# Patient Record
Sex: Male | Born: 1940 | Race: White | Hispanic: No | Smoking: Former smoker
Health system: Southern US, Community
[De-identification: ages and names within clinical notes are randomized; demographics above are authoritative.]

## PROBLEM LIST (undated history)

## (undated) DIAGNOSIS — I2510 Atherosclerotic heart disease of native coronary artery without angina pectoris: Secondary | ICD-10-CM

## (undated) DIAGNOSIS — I10 Essential (primary) hypertension: Secondary | ICD-10-CM

## (undated) DIAGNOSIS — C801 Malignant (primary) neoplasm, unspecified: Secondary | ICD-10-CM

## (undated) DIAGNOSIS — R0600 Dyspnea, unspecified: Secondary | ICD-10-CM

## (undated) DIAGNOSIS — E785 Hyperlipidemia, unspecified: Secondary | ICD-10-CM

## (undated) DIAGNOSIS — R609 Edema, unspecified: Secondary | ICD-10-CM

## (undated) DIAGNOSIS — G2581 Restless legs syndrome: Secondary | ICD-10-CM

## (undated) DIAGNOSIS — G4733 Obstructive sleep apnea (adult) (pediatric): Secondary | ICD-10-CM

## (undated) DIAGNOSIS — R600 Localized edema: Secondary | ICD-10-CM

## (undated) DIAGNOSIS — J310 Chronic rhinitis: Secondary | ICD-10-CM

## (undated) DIAGNOSIS — N529 Male erectile dysfunction, unspecified: Secondary | ICD-10-CM

## (undated) DIAGNOSIS — I255 Ischemic cardiomyopathy: Secondary | ICD-10-CM

## (undated) DIAGNOSIS — F419 Anxiety disorder, unspecified: Secondary | ICD-10-CM

## (undated) DIAGNOSIS — E669 Obesity, unspecified: Secondary | ICD-10-CM

## (undated) DIAGNOSIS — I4819 Other persistent atrial fibrillation: Secondary | ICD-10-CM

## (undated) DIAGNOSIS — Z720 Tobacco use: Secondary | ICD-10-CM

## (undated) DIAGNOSIS — M1990 Unspecified osteoarthritis, unspecified site: Secondary | ICD-10-CM

## (undated) DIAGNOSIS — G4700 Insomnia, unspecified: Secondary | ICD-10-CM

## (undated) HISTORY — DX: Hyperlipidemia, unspecified: E78.5

## (undated) HISTORY — DX: Unspecified osteoarthritis, unspecified site: M19.90

## (undated) HISTORY — DX: Atherosclerotic heart disease of native coronary artery without angina pectoris: I25.10

## (undated) HISTORY — DX: Localized edema: R60.0

## (undated) HISTORY — DX: Insomnia, unspecified: G47.00

## (undated) HISTORY — PX: EYE SURGERY: SHX253

## (undated) HISTORY — DX: Malignant (primary) neoplasm, unspecified: C80.1

## (undated) HISTORY — DX: Restless legs syndrome: G25.81

## (undated) HISTORY — DX: Chronic rhinitis: J31.0

## (undated) HISTORY — DX: Obstructive sleep apnea (adult) (pediatric): G47.33

## (undated) HISTORY — DX: Anxiety disorder, unspecified: F41.9

## (undated) HISTORY — DX: Edema, unspecified: R60.9

## (undated) HISTORY — DX: Essential (primary) hypertension: I10

## (undated) HISTORY — DX: Male erectile dysfunction, unspecified: N52.9

## (undated) HISTORY — DX: Obesity, unspecified: E66.9

## (undated) HISTORY — PX: HEMORRHOID SURGERY: SHX153

## (undated) HISTORY — PX: TOTAL KNEE ARTHROPLASTY: SHX125

## (undated) HISTORY — DX: Tobacco use: Z72.0

## (undated) HISTORY — PX: CORONARY ANGIOPLASTY: SHX604

---

## 2004-09-03 ENCOUNTER — Ambulatory Visit (HOSPITAL_COMMUNITY): Admission: RE | Admit: 2004-09-03 | Discharge: 2004-09-03 | Payer: Self-pay | Admitting: Orthopedic Surgery

## 2004-10-07 ENCOUNTER — Ambulatory Visit: Payer: Self-pay | Admitting: Internal Medicine

## 2004-10-08 ENCOUNTER — Ambulatory Visit: Payer: Self-pay

## 2004-10-09 ENCOUNTER — Encounter (INDEPENDENT_AMBULATORY_CARE_PROVIDER_SITE_OTHER): Payer: Self-pay | Admitting: *Deleted

## 2004-10-09 ENCOUNTER — Inpatient Hospital Stay (HOSPITAL_COMMUNITY): Admission: RE | Admit: 2004-10-09 | Discharge: 2004-10-13 | Payer: Self-pay | Admitting: Orthopedic Surgery

## 2004-12-16 ENCOUNTER — Emergency Department (HOSPITAL_COMMUNITY): Admission: EM | Admit: 2004-12-16 | Discharge: 2004-12-16 | Payer: Self-pay | Admitting: Emergency Medicine

## 2005-09-22 ENCOUNTER — Ambulatory Visit (HOSPITAL_COMMUNITY): Admission: RE | Admit: 2005-09-22 | Discharge: 2005-09-22 | Payer: Self-pay | Admitting: Pulmonary Disease

## 2005-11-03 ENCOUNTER — Ambulatory Visit (HOSPITAL_BASED_OUTPATIENT_CLINIC_OR_DEPARTMENT_OTHER): Admission: RE | Admit: 2005-11-03 | Discharge: 2005-11-03 | Payer: Self-pay | Admitting: Pulmonary Disease

## 2005-11-03 ENCOUNTER — Encounter: Payer: Self-pay | Admitting: Pulmonary Disease

## 2005-11-07 ENCOUNTER — Ambulatory Visit: Payer: Self-pay | Admitting: Internal Medicine

## 2008-01-17 ENCOUNTER — Ambulatory Visit (HOSPITAL_COMMUNITY): Admission: RE | Admit: 2008-01-17 | Discharge: 2008-01-17 | Payer: Self-pay | Admitting: Pulmonary Disease

## 2008-01-30 ENCOUNTER — Encounter (INDEPENDENT_AMBULATORY_CARE_PROVIDER_SITE_OTHER): Payer: Self-pay | Admitting: Pulmonary Disease

## 2008-01-30 ENCOUNTER — Ambulatory Visit (HOSPITAL_COMMUNITY): Admission: RE | Admit: 2008-01-30 | Discharge: 2008-01-30 | Payer: Self-pay | Admitting: Pulmonary Disease

## 2008-01-30 ENCOUNTER — Ambulatory Visit: Payer: Self-pay | Admitting: Cardiology

## 2008-02-01 ENCOUNTER — Ambulatory Visit: Payer: Self-pay | Admitting: Internal Medicine

## 2008-02-09 ENCOUNTER — Ambulatory Visit: Payer: Self-pay | Admitting: Internal Medicine

## 2008-02-09 LAB — CONVERTED CEMR LAB
BUN: 20 mg/dL (ref 6–23)
Glucose, Bld: 100 mg/dL — ABNORMAL HIGH (ref 70–99)
Potassium: 4.3 meq/L (ref 3.5–5.3)

## 2008-02-29 ENCOUNTER — Ambulatory Visit: Payer: Self-pay | Admitting: Pulmonary Disease

## 2008-02-29 DIAGNOSIS — J309 Allergic rhinitis, unspecified: Secondary | ICD-10-CM | POA: Insufficient documentation

## 2008-02-29 DIAGNOSIS — F17200 Nicotine dependence, unspecified, uncomplicated: Secondary | ICD-10-CM

## 2008-02-29 DIAGNOSIS — G4733 Obstructive sleep apnea (adult) (pediatric): Secondary | ICD-10-CM

## 2008-02-29 DIAGNOSIS — E785 Hyperlipidemia, unspecified: Secondary | ICD-10-CM

## 2008-02-29 DIAGNOSIS — I10 Essential (primary) hypertension: Secondary | ICD-10-CM | POA: Insufficient documentation

## 2008-03-01 ENCOUNTER — Encounter: Payer: Self-pay | Admitting: Pulmonary Disease

## 2008-03-05 ENCOUNTER — Telehealth: Payer: Self-pay | Admitting: Pulmonary Disease

## 2008-03-28 ENCOUNTER — Encounter: Payer: Self-pay | Admitting: Pulmonary Disease

## 2008-03-28 ENCOUNTER — Ambulatory Visit (HOSPITAL_COMMUNITY): Admission: RE | Admit: 2008-03-28 | Discharge: 2008-03-28 | Payer: Self-pay | Admitting: Internal Medicine

## 2008-04-05 ENCOUNTER — Ambulatory Visit: Payer: Self-pay | Admitting: Internal Medicine

## 2008-04-09 ENCOUNTER — Ambulatory Visit: Payer: Self-pay | Admitting: Cardiovascular Disease

## 2008-04-09 ENCOUNTER — Encounter: Payer: Self-pay | Admitting: Internal Medicine

## 2008-04-09 LAB — CONVERTED CEMR LAB
ALT: 17 units/L (ref 0–53)
AST: 11 units/L (ref 0–37)
Albumin: 4.3 g/dL (ref 3.5–5.2)
Alkaline Phosphatase: 56 units/L (ref 39–117)
BUN: 17 mg/dL (ref 6–23)
Calcium: 9.5 mg/dL (ref 8.4–10.5)
Chloride: 103 meq/L (ref 96–112)
Cholesterol: 130 mg/dL (ref 0–200)
Creatinine, Ser: 0.83 mg/dL (ref 0.40–1.50)
HDL: 38 mg/dL — ABNORMAL LOW (ref >39)
Total Bilirubin: 0.7 mg/dL (ref 0.3–1.2)

## 2008-04-15 ENCOUNTER — Ambulatory Visit: Payer: Self-pay | Admitting: Pulmonary Disease

## 2008-11-01 ENCOUNTER — Encounter: Payer: Self-pay | Admitting: Pulmonary Disease

## 2008-11-11 ENCOUNTER — Encounter: Payer: Self-pay | Admitting: Pulmonary Disease

## 2008-11-21 ENCOUNTER — Encounter: Payer: Self-pay | Admitting: Pulmonary Disease

## 2009-02-13 ENCOUNTER — Emergency Department (HOSPITAL_COMMUNITY): Admission: EM | Admit: 2009-02-13 | Discharge: 2009-02-13 | Payer: Self-pay | Admitting: Emergency Medicine

## 2009-04-22 ENCOUNTER — Telehealth (INDEPENDENT_AMBULATORY_CARE_PROVIDER_SITE_OTHER): Payer: Self-pay | Admitting: *Deleted

## 2009-04-24 ENCOUNTER — Ambulatory Visit: Payer: Self-pay | Admitting: Pulmonary Disease

## 2009-04-24 DIAGNOSIS — G2581 Restless legs syndrome: Secondary | ICD-10-CM

## 2009-04-24 DIAGNOSIS — G4700 Insomnia, unspecified: Secondary | ICD-10-CM

## 2009-04-25 LAB — CONVERTED CEMR LAB
ALT: 16 units/L (ref 0–53)
AST: 14 units/L (ref 0–37)
Albumin: 4.4 g/dL (ref 3.5–5.2)
Alkaline Phosphatase: 82 units/L (ref 39–117)
BUN: 23 mg/dL (ref 6–23)
Bilirubin, Direct: 0.2 mg/dL (ref 0.0–0.3)
CO2: 28 meq/L (ref 19–32)
Calcium: 9.6 mg/dL (ref 8.4–10.5)
Creatinine, Ser: 1.2 mg/dL (ref 0.4–1.5)
Ferritin: 93.7 ng/mL (ref 22.0–322.0)
Folate: 11 ng/mL
HCT: 52.4 % — ABNORMAL HIGH (ref 39.0–52.0)
Hemoglobin: 17.7 g/dL — ABNORMAL HIGH (ref 13.0–17.0)
Lymphs Abs: 1.3 10*3/uL (ref 0.7–4.0)
MCHC: 33.8 g/dL (ref 30.0–36.0)
Neutro Abs: 9.3 10*3/uL — ABNORMAL HIGH (ref 1.4–7.7)
Neutrophils Relative %: 78.8 % — ABNORMAL HIGH (ref 43.0–77.0)
Platelets: 223 10*3/uL (ref 150.0–400.0)
RBC: 5.41 M/uL (ref 4.22–5.81)
Saturation Ratios: 31.4 % (ref 20.0–50.0)
Total Protein: 8.1 g/dL (ref 6.0–8.3)
WBC: 11.7 10*3/uL — ABNORMAL HIGH (ref 4.5–10.5)

## 2009-04-30 ENCOUNTER — Encounter: Payer: Self-pay | Admitting: Cardiovascular Disease

## 2009-06-05 ENCOUNTER — Ambulatory Visit: Payer: Self-pay | Admitting: Pulmonary Disease

## 2009-06-20 ENCOUNTER — Telehealth (INDEPENDENT_AMBULATORY_CARE_PROVIDER_SITE_OTHER): Payer: Self-pay | Admitting: *Deleted

## 2009-08-19 ENCOUNTER — Encounter: Payer: Self-pay | Admitting: Pulmonary Disease

## 2009-08-30 HISTORY — PX: ROTATOR CUFF REPAIR: SHX139

## 2009-09-23 ENCOUNTER — Encounter: Admission: RE | Admit: 2009-09-23 | Discharge: 2009-09-23 | Payer: Self-pay | Admitting: Specialist

## 2009-10-07 ENCOUNTER — Ambulatory Visit: Payer: Self-pay | Admitting: Internal Medicine

## 2009-10-07 DIAGNOSIS — I2510 Atherosclerotic heart disease of native coronary artery without angina pectoris: Secondary | ICD-10-CM

## 2009-10-15 ENCOUNTER — Encounter: Payer: Self-pay | Admitting: Pulmonary Disease

## 2009-10-17 ENCOUNTER — Encounter: Payer: Self-pay | Admitting: Pulmonary Disease

## 2009-10-21 ENCOUNTER — Inpatient Hospital Stay (HOSPITAL_COMMUNITY): Admission: RE | Admit: 2009-10-21 | Discharge: 2009-10-24 | Payer: Self-pay | Admitting: Specialist

## 2009-10-28 HISTORY — PX: BACK SURGERY: SHX140

## 2010-04-07 ENCOUNTER — Telehealth: Payer: Self-pay | Admitting: Pulmonary Disease

## 2010-04-29 ENCOUNTER — Encounter: Payer: Self-pay | Admitting: Pulmonary Disease

## 2010-04-30 ENCOUNTER — Ambulatory Visit: Payer: Self-pay | Admitting: Pulmonary Disease

## 2010-07-16 ENCOUNTER — Emergency Department (HOSPITAL_COMMUNITY): Admission: EM | Admit: 2010-07-16 | Discharge: 2010-07-16 | Payer: Self-pay | Admitting: Emergency Medicine

## 2010-07-20 ENCOUNTER — Emergency Department (HOSPITAL_COMMUNITY): Admission: EM | Admit: 2010-07-20 | Discharge: 2010-07-20 | Payer: Self-pay | Admitting: Emergency Medicine

## 2010-07-23 ENCOUNTER — Emergency Department (HOSPITAL_COMMUNITY): Admission: EM | Admit: 2010-07-23 | Discharge: 2010-07-23 | Payer: Self-pay | Admitting: Emergency Medicine

## 2010-08-28 ENCOUNTER — Telehealth: Payer: Self-pay | Admitting: Pulmonary Disease

## 2010-09-17 ENCOUNTER — Ambulatory Visit
Admission: RE | Admit: 2010-09-17 | Discharge: 2010-09-17 | Payer: Self-pay | Source: Home / Self Care | Attending: Pulmonary Disease | Admitting: Pulmonary Disease

## 2010-09-20 ENCOUNTER — Encounter: Payer: Self-pay | Admitting: Internal Medicine

## 2010-09-21 ENCOUNTER — Encounter: Payer: Self-pay | Admitting: Internal Medicine

## 2010-09-29 NOTE — Assessment & Plan Note (Signed)
Summary: ec6      Allergies Added: NKDA  Visit Type:  EC6 Referring Provider:  Arvilla Meres Primary Provider:  Kari Baars  CC:  no complaints.  History of Present Illness: [name] [name] is a very pleasant 70 year old gentleman with a history of coronary artery disease, angioplasty in 1999, normal stress test in 2006, essentially normal echocardiogram in 2009, obesity, sleep apnea, high cholesterol, hypertension, continued smoking, severe back pain who presents for preoperative evaluation.  [name] [name] states that overall he is doing well. He denies any significant shortness of breath, chest pain. He is relatively active though limited to some degree by his claudication type pain in his buttock and calf. He is due to have surgery on February 22. He has no complaints. He has dropped his weight 25 pounds over the past several months by watching his diet. He has cut back on his smoking from 2 packs a day to one half pack per day.  Current Problems (verified): 1)  Restless Leg Syndrome  (ICD-333.94) 2)  Insomnia  (ICD-780.52) 3)  Tobacco Abuse  (ICD-305.1) 4)  Sleep Apnea, Obstructive  (ICD-327.23) 5)  Hypertension  (ICD-401.9) 6)  Hyperlipidemia  (ICD-272.4) 7)  Allergic Rhinitis  (ICD-477.9)  Current Medications (verified): 1)  Hydrocodone-Acetaminophen 10-500 Mg  Tabs (Hydrocodone-Acetaminophen) .... Take Four Daily. 2)  Celebrex 200 Mg  Caps (Celecoxib) .... 2 By Mouth Daily 3)  Alprazolam 2 Mg  Tabs (Alprazolam) .... Take One Before Bed. 4)  Ropinirole Hcl 1 Mg Tabs (Ropinirole Hcl) .... One By Mouth At Bedtime 5)  Lovastatin 20 Mg Tabs (Lovastatin) .... Take One Tablet By Mouth Daily At Bedtime 6)  Zolpidem Tartrate 5 Mg Tabs (Zolpidem Tartrate) .... Take 1 Tab By Mouth At Bedtime As Needed  Allergies (verified): No Known Drug Allergies  Past History:  Past Medical History: Last updated: 06/05/2009 HTN Hyperlipidemia Rhinitis Peripheral edema Osteoarthritis Morbid  Obesity CAD s/p angioplasty Erectile dysfunction Severe OSA on CPAP 12 Restless leg syndrome  Past Surgical History: Last updated: 03-23-2008 Total Knee Arthroplasty left  hemorrhoidectomy  Family History: Last updated: 2008-03-23 Father died in World War II Mother died of natural causes Has one 1/2 sister no health problems  Social History: Last updated: 03-23-08 Widow.  Disabled arthritis.  1ppd 70 yo.  Retired Curator.  Risk Factors: Smoking Status: current (03-23-2008) Packs/Day: 1 (2008-03-23)  Review of Systems  The patient denies anorexia, fever, weight loss, weight gain, vision loss, decreased hearing, hoarseness, chest pain, syncope, dyspnea on exertion, peripheral edema, prolonged cough, headaches, hemoptysis, abdominal pain, melena, hematochezia, severe indigestion/heartburn, hematuria, incontinence, genital sores, muscle weakness, suspicious skin lesions, transient blindness, difficulty walking, depression, unusual weight change, abnormal bleeding, enlarged lymph nodes, angioedema, breast masses, and testicular masses.         Miuld SOB with exertion, chronic  Vital Signs:  Patient profile:   70 year old male Height:      72 inches Weight:      286 pounds BMI:     38.93 Pulse rate:   72 / minute Pulse rhythm:   regular BP sitting:   122 / 82  (left arm) Cuff size:   large  Vitals Entered By: [name] [name] (October 07, 2009 2:02 PM)  Physical Exam  General:  moderately obese gentleman in no apparent distress. He appears comfortable on the exam table. Not short of breath. No significant coughing. HEENT exam is benign, oropharynx is clear, neck is supple with no JVP or carotid bruits, heart sounds  are regular with normal S1 and S2 with no murmurs appreciated, lungs are clear bilaterally scant rales, abdominal exam notable for moderate obesity, no significant lower extremity edema, neurologic exam is grossly nonfocal, skin is warm and dry pulses are  equal and symmetrical in his upper and lower extremities.   Impression & Recommendations:  Problem # 1:  CAD, NATIVE VESSEL (ICD-414.01) history of coronary artery disease with angioplasty by Dr. Deborah Chalk in 1999. Negative stress test in 2006, normal echo in 2009. He is symptom-free. No significant carotid disease in 2006. No significant lower extremity arterial disease when evaluated in May 2009.  Problem # 2:  PRE-OPERATIVE CARDIAC EXAM (ICD-V72.81)  Given that he has no symptoms, we will clear him for surgery. I did talk to him about performing a stress test as it has been several years since his last study. He would like to hold off at this time as he is not having any problems.  Problem # 3:  TOBACCO ABUSE (ICD-305.1) we did spend some time talking to him about his smoking and he'll continue to try to cut back. He was smoking 2 packs per day and currently smokes one half pack per day. He is not interested in Chantix or other medications at this time.  Problem # 4:  HYPERLIPIDEMIA-MIXED (ICD-272.4) we will obtain his most recent cholesterol panel from Dr. Juanetta Gosling. He states that he is due to have a lab test next week when he goes to see him for clearance. His updated medication list for this problem includes:    Lovastatin 20 Mg Tabs (Lovastatin) .Marland Kitchen... Take one tablet by mouth daily at bedtime

## 2010-09-29 NOTE — Progress Notes (Signed)
Summary: Office Visit  Office Visit   Imported By: Harlon Flor 10/14/2009 16:09:46  _____________________________________________________________________  External Attachment:    Type:   Image     Comment:   External Document

## 2010-09-29 NOTE — Letter (Signed)
Summary: LMN/[facility]  LMN/[facility]   Imported By: Lester [place]st 05/11/2010 07:53:17  _____________________________________________________________________  External Attachment:    Type:   Image     Comment:   External Document

## 2010-09-29 NOTE — Letter (Signed)
Summary: CMN/[facility]  CMN/[facility]   Imported By: Lester Mayetta 10/21/2009 09:42:51  _____________________________________________________________________  External Attachment:    Type:   Image     Comment:   External Document

## 2010-09-29 NOTE — Progress Notes (Signed)
Summary: speak to nurse  Phone Note From Other Clinic Call back at [phone]   Caller: megan//[facility] Call For: sood Summary of Call: Request a rx for cpap supplies and a copy of pt's sleep study, fax # is [phone]. Initial call taken by: Darletta Moll,  April 07, 2010 9:28 AM  Follow-up for Phone Call        VS, do you want to ok cpap supplies.  Pt last saw you Oct 2010 and was instructed to f/u in 4 to 6 weeks. Pt has never followed up since the Oct appt.   and has no pending appts with you.  please advise.  Aundra Millet Reynolds LPN  April 07, 2010 9:40 AM   Additional Follow-up for Phone Call Additional follow up Details #1::        Okay to renew CPAP supplies this time.  He will need to schedule next available ROV to review status.  Ok to fax sleep study results. Additional Follow-up by: Coralyn Helling MD,  April 07, 2010 9:50 AM    Additional Follow-up for Phone Call Additional follow up Details #2::    Orders faxed to [phone] with pt's demographics, and sleep study. Zackery Barefoot CMA  April 07, 2010 12:05 PM

## 2010-09-29 NOTE — Medication Information (Signed)
Summary: CPAP Mask/[facility]  CPAP Mask/[facility]   Imported By: Sherian Rein 10/22/2009 07:09:24  _____________________________________________________________________  External Attachment:    Type:   Image     Comment:   External Document

## 2010-09-29 NOTE — Assessment & Plan Note (Signed)
Summary: follow up on CPAP//jwr   Visit Type:  Follow-up Copy to:  Dossie Arbour Primary Provider/Referring Provider:  Kari Baars  CC:  OSA...RLS.Marland KitchenMarland KitchenInsomnia... The patient says he is doing well on cpap...would like to discuss a new supplier for supplies.  History of Present Illness: 70 yo male with severe OSA on CPAP 12, and RLS.  He has been sleeping well.  He goes to bed at 11pm.  He sleeps through the night.  He gets out of bed at 730am, and feels good.  He uses xanax and 2 mg requip at night.  He is not using ambien.  This didn't help.  He uses his CPAP for the whole night.  He has not problem using his mask.  He continues to smoke cigarettes.  He wants to get set up with [facility] for his DME.  Preventive Screening-Counseling & Management  Alcohol-Tobacco     Smoking Status: current     Packs/Day: 1  Current Medications (verified): 1)  Oxycodone Hcl 15 Mg Tabs (Oxycodone Hcl) .Marland Kitchen.. 1 By Mouth Three Times A Day 2)  Celebrex 200 Mg  Caps (Celecoxib) .Marland Kitchen.. 1 By Mouth Two Times A Day 3)  Alprazolam 2 Mg  Tabs (Alprazolam) .... Take One Before Bed. 4)  Ropinirole Hcl 1 Mg Tabs (Ropinirole Hcl) .... 2 By Mouth At Bedtime 5)  Lovastatin 20 Mg Tabs (Lovastatin) .... Take One Tablet By Mouth Daily At Bedtime 6)  Zolpidem Tartrate 5 Mg Tabs (Zolpidem Tartrate) .... Take 1 Tab By Mouth At Bedtime As Needed  Allergies (verified): No Known Drug Allergies  Past History:  Past Medical History: Reviewed history from 06/05/2009 and no changes required. HTN Hyperlipidemia Rhinitis Peripheral edema Osteoarthritis Morbid Obesity CAD s/p angioplasty Erectile dysfunction Severe OSA on CPAP 12 Restless leg syndrome  Past Surgical History: Total Knee Arthroplasty left  hemorrhoidectomy Back surgery 10/2009  Vital Signs:  Patient profile:   70 year old male Height:      72 inches (182.88 cm) Weight:      299 pounds (135.91 kg) BMI:     40.70 O2 Sat:      95 % on  Room air Temp:     98.2 degrees F (36.78 degrees C) oral Pulse rate:   72 / minute BP sitting:   132 / 78  (left arm) Cuff size:   large  Vitals Entered By: Michel Bickers CMA (April 30, 2010 11:45 AM)  O2 Sat at Rest %:  95 O2 Flow:  Room air CC: OSA...RLS.Marland KitchenMarland KitchenInsomnia... The patient says he is doing well on cpap...would like to discuss a new supplier for supplies Comments Medications reviewed with the patient. Daytime phone verified. Michel Bickers CMA  April 30, 2010 11:53 AM   Physical Exam  General:  obese.   Nose:  no deformity, discharge, inflammation, or lesions Mouth:  Dentures, MP 3, enlarged tongue Neck:  no JVD.   Lungs:  clear bilaterally to auscultation and percussion Heart:  regular rate and rhythm, S1, S2 without murmurs, rubs, gallops, or clicks Extremities:  no edema Cervical Nodes:  no significant adenopathy Psych:  alert and cooperative; normal mood and affect; normal attention span and concentration   Impression & Recommendations:  Problem # 1:  SLEEP APNEA, OBSTRUCTIVE (ICD-327.23)  He has done well with CPAP.  Will arrange for him to get a new DME.  Problem # 2:  RESTLESS LEG SYNDROME (ICD-333.94)  He is to continue requip at bedtime.  Advised that he  may improve once his back pain is further addressed.  Problem # 3:  INSOMNIA (ICD-780.52)  likely from his RLS.  He is to continue xanax.  Problem # 4:  TOBACCO ABUSE (ICD-305.1)  discussed the importance of smoking cessation.  Medications Added to Medication List This Visit: 1)  Oxycodone Hcl 15 Mg Tabs (Oxycodone hcl) .Marland Kitchen.. 1 by mouth three times a day 2)  Celebrex 200 Mg Caps (Celecoxib) .Marland Kitchen.. 1 by mouth two times a day 3)  Ropinirole Hcl 1 Mg Tabs (Ropinirole hcl) .... 2 by mouth at bedtime  Complete Medication List: 1)  Oxycodone Hcl 15 Mg Tabs (Oxycodone hcl) .Marland Kitchen.. 1 by mouth three times a day 2)  Celebrex 200 Mg Caps (Celecoxib) .Marland Kitchen.. 1 by mouth two times a day 3)  Alprazolam 2 Mg Tabs  (Alprazolam) .... Take one before bed. 4)  Ropinirole Hcl 1 Mg Tabs (Ropinirole hcl) .... 2 by mouth at bedtime 5)  Lovastatin 20 Mg Tabs (Lovastatin) .... Take one tablet by mouth daily at bedtime  Other Orders: Est. Patient Level III (57846)  Patient Instructions: 1)  Follow up in one year

## 2010-10-01 NOTE — Progress Notes (Signed)
Summary: prescription  Phone Note Call from Patient Call back at Home Phone [phone]   Caller: Patient Call For: Dr. Craige Cotta Summary of Call: Patient phoned and stated that the last time he saw Dr. Craige Cotta he asked if he needed anything for sleep and at the time he did not. But he has only slept on an average of two hours each night for the last week and wanted to know if Dr. Craige Cotta would call him in something to help him sleep. [facility] uses [facility] [address] [phone]. Patient can be reached on his  cell [phone] Initial call taken by: Vedia Coffer,  August 28, 2010 8:48 AM  Follow-up for Phone Call        Spoke with pt and he states that over the last week or so he has had trouble falling asleep. He states his brain just won't cut off. He had some xanax from his PCP but this has stopped working.  Please advise. Carron Curie CMA  August 28, 2010 9:26 AM   Additional Follow-up for Phone Call Additional follow up Details #1::        He needs to come in for further evaluation of his insomnia.  I am not sure a sleep aide alone will be enough.  Please schedule him for next available ROV to discuss further. Additional Follow-up by: Coralyn Helling MD,  August 28, 2010 1:33 PM    Additional Follow-up for Phone Call Additional follow up Details #2::    Called, spoke with pt.  He was informed of above per VS.  ROV scheduled for Jan 19 at 3:45 pm with VS -- first available.  Dr. Craige Cotta, pt would like to know if you can prescribe something to hold him over until OV bc he "will need something."  Pls advise.  Thanks! Gweneth Dimitri RN  August 28, 2010 2:43 PM   Additional Follow-up for Phone Call Additional follow up Details #3:: Details for Additional Follow-up Action Taken: can give him script for trazodone 50 mg at bedtime as needed.  Dispense 30 with no refills.    called and spoke with pt and he is aware of meds sent in to pharmacy per VS---pt will keep his appt in jan with vS Randell Loop CMA  August 28, 2010 3:06 PM  Additional Follow-up by: Coralyn Helling MD,  August 28, 2010 2:58 PM  New/Updated Medications: TRAZODONE HCL 50 MG TABS (TRAZODONE HCL) take 1 tablet by mouth at bedtime as needed for sleep Prescriptions: TRAZODONE HCL 50 MG TABS (TRAZODONE HCL) take 1 tablet by mouth at bedtime as needed for sleep  #30 x 0   Entered by:   Randell Loop CMA   Authorized by:   Coralyn Helling MD   Signed by:   Randell Loop CMA on 08/28/2010   Method used:   [facility]  [address] [phone][facility] (retail)       [address]       [place], [place]  [id number]       Ph: [phone] or [phone]       Fax: [phone]   RxID:   [id number]

## 2010-10-01 NOTE — Assessment & Plan Note (Signed)
Summary: discuss sleep problems / cj   Copy to:  Dossie Arbour Primary Provider/Referring Provider:  Kari Baars  CC:  Follow up. Pt states he uses his cpap eveyrnight except when he has sinus problems. Pt states he is not eing able to sleep and would like an rx for that. Pt states he is only able to sleep a couple hrs at a time.  History of Present Illness: 70 yo male with severe OSA on CPAP 12, and RLS.  He has been getting more trouble sleeping.  He has trouble falling asleep and staying asleep.  He goes to bed at 1030pm.  He takes xanax at 930pm, and requip at 1030 pm.  His legs symptoms have been under control.  He wakes up at 330 am, and then can't fall back to sleep.  He gets out of bed at 4am.  He feels tired during the day, but is not able to take naps.  He will occasionally using oxycodone to help with back and leg pains, but tries to avoid using this around his bed time.  He was recently started on trazadone.  He takes this at 930pm, and this seems to help, but does not last long enough.  This seems to work better when combined with xanax.  He has been feeling very anxious, and he thinks this is contributing to trouble with his sleep.  He has been doing well with CPAP.    Current Medications (verified): 1)  Oxycodone Hcl 15 Mg Tabs (Oxycodone Hcl) .Marland Kitchen.. 1 By Mouth Three Times A Day 2)  Celebrex 200 Mg  Caps (Celecoxib) .Marland Kitchen.. 1 By Mouth Two Times A Day 3)  Alprazolam 2 Mg  Tabs (Alprazolam) .... Take One Before Bed. 4)  Ropinirole Hcl 1 Mg Tabs (Ropinirole Hcl) .... 2 By Mouth At Bedtime 5)  Lovastatin 20 Mg Tabs (Lovastatin) .... Take One Tablet By Mouth Daily At Bedtime 6)  Trazodone Hcl 50 Mg Tabs (Trazodone Hcl) .... Take 1 Tablet By Mouth At Bedtime As Needed For Sleep  Allergies (verified): No Known Drug Allergies  Past History:  Past Medical History: HTN Hyperlipidemia Rhinitis Peripheral edema Osteoarthritis Morbid Obesity CAD s/p angioplasty Erectile  dysfunction Severe OSA on CPAP 12 Restless leg syndrome Insomnia Anxiety  Past Surgical History: Reviewed history from 04/30/2010 and no changes required. Total Knee Arthroplasty left  hemorrhoidectomy Back surgery 10/2009  Vital Signs:  Patient profile:   70 year old male Height:      72 inches Weight:      306.13 pounds BMI:     41.67 O2 Sat:      98 % on Room air Temp:     98.1 degrees F oral Pulse rate:   65 / minute BP sitting:   138 / 82  (left arm) Cuff size:   large  Vitals Entered By: Carver Fila (September 17, 2010 3:48 PM)  O2 Flow:  Room air CC: Follow up. Pt states he uses his cpap eveyrnight except when he has sinus problems. Pt states he is not eing able to sleep and would like an rx for that. Pt states he is only able to sleep a couple hrs at a time Comments meds and allergies updated Phone number updated Carver Fila  September 17, 2010 3:48 PM    Physical Exam  General:  obese.   Nose:  no deformity, discharge, inflammation, or lesions Mouth:  Dentures, MP 3, enlarged tongue Neck:  no JVD.   Lungs:  clear bilaterally  to auscultation and percussion Heart:  regular rate and rhythm, S1, S2 without murmurs, rubs, gallops, or clicks Extremities:  no edema Neurologic:  normal CN II-XII and strength normal.   Cervical Nodes:  no significant adenopathy Psych:  anxious.     Impression & Recommendations:  Problem # 1:  INSOMNIA (ICD-780.52)  Likely related to anxiety.  Will increase trazodone to 100 mg at bedtime, and then try as needed alprazolam only.  He is to call if this combination is not working.  He is to follow up with primary care for further assessment of his anxiety.  Problem # 2:  RESTLESS LEG SYNDROME (ICD-333.94)  Stable on current dose of ropinirole.  Problem # 3:  SLEEP APNEA, OBSTRUCTIVE (ICD-327.23)  No change to CPAP set up.  Medications Added to Medication List This Visit: 1)  Trazodone Hcl 100 Mg Tabs (Trazodone hcl) .... One at  bedtime as needed for sleep  Complete Medication List: 1)  Oxycodone Hcl 15 Mg Tabs (Oxycodone hcl) .Marland Kitchen.. 1 by mouth three times a day 2)  Celebrex 200 Mg Caps (Celecoxib) .Marland Kitchen.. 1 by mouth two times a day 3)  Alprazolam 2 Mg Tabs (Alprazolam) .... Take one before bed. 4)  Ropinirole Hcl 1 Mg Tabs (Ropinirole hcl) .... 2 by mouth at bedtime 5)  Lovastatin 20 Mg Tabs (Lovastatin) .... Take one tablet by mouth daily at bedtime 6)  Trazodone Hcl 100 Mg Tabs (Trazodone hcl) .... One at bedtime as needed for sleep  Other Orders: Est. Patient Level IV (91478)  Patient Instructions: 1)  Try trazodone 100 mg at bedtime for sleep 2)  Follow up in 4 months Prescriptions: TRAZODONE HCL 100 MG TABS (TRAZODONE HCL) one at bedtime as needed for sleep  #30 x 6   Entered and Authorized by:   Coralyn Helling MD   Signed by:   Coralyn Helling MD on 09/17/2010   Method used:   [facility]        [facility]  [address] [phone][facility] (retail)       [address]       [place], [place]  [id number]       Ph: [phone] or [phone]       Fax: [phone]   RxID:   [id number]

## 2010-11-18 LAB — CBC
HCT: 44.9 % (ref 39.0–52.0)
Hemoglobin: 15.4 g/dL (ref 13.0–17.0)
MCHC: 34.4 g/dL (ref 30.0–36.0)
MCV: 95.6 fL (ref 78.0–100.0)
RBC: 4.7 MIL/uL (ref 4.22–5.81)
WBC: 8.2 10*3/uL (ref 4.0–10.5)

## 2010-11-18 LAB — BASIC METABOLIC PANEL
CO2: 27 mEq/L (ref 19–32)
Calcium: 8.8 mg/dL (ref 8.4–10.5)
Chloride: 102 mEq/L (ref 96–112)
Creatinine, Ser: 0.75 mg/dL (ref 0.4–1.5)
Creatinine, Ser: 0.87 mg/dL (ref 0.4–1.5)
GFR calc non Af Amer: >60 mL/min (ref >60)
GFR calc non Af Amer: >60 mL/min (ref >60)
Glucose, Bld: 107 mg/dL — ABNORMAL HIGH (ref 70–99)
Potassium: 4.5 mEq/L (ref 3.5–5.1)
Sodium: 134 mEq/L — ABNORMAL LOW (ref 135–145)

## 2010-11-18 LAB — HEMOGLOBIN AND HEMATOCRIT, BLOOD
HCT: 38.3 % — ABNORMAL LOW (ref 39.0–52.0)
HCT: 38.7 % — ABNORMAL LOW (ref 39.0–52.0)

## 2011-01-12 NOTE — Assessment & Plan Note (Signed)
[facility] OFFICE NOTE   NAME:[name] [name], [name] [name]                    MRN:          [id number]  DATE:02/01/2008                            DOB:          [birth year]    REFERRING PHYSICIAN:  Oneal Deputy. Juanetta Gosling, M.D.   REASON FOR REFERRAL:  Dyspnea and lower extremity edema.   HISTORY OF PRESENT ILLNESS:  [name] [name] is a very pleasant 70 year old male  with multiple medical problems.  He does have a history of coronary  artery disease and is status post angioplasty by Dr. Deborah Chalk in 1999.  He also had a Myoview in 2006 preoperatively for a knee replacement, and  this was apparently normal.   The remainder of his review of systems is notable for morbid obesity,  hypertension, ongoing tobacco use, and hyperlipidemia.  He also has a  history of presumed obstructive sleep apnea.  He underwent a sleep study  several years ago, but was unable to tolerate it due to all the wires  and the mask.  He said he did not sleep at all that night.   Over the past 1-2 months, he has noticed that he has had significant  lower extremity edema.  This is new for him.  He has had lower extremity  ultrasounds which showed no evidence of clots and fairly good blood  flow.  He denies any orthopnea, no PND.  Does have chronic exertional  dyspnea, but this has not changed.  He has not had any chest pain.  He  does feel very fatigued.  He says he has been told he snores quite  heavily.   REVIEW OF SYSTEMS:  Notable for fatigue, exertional dyspnea, erectile  dysfunction, arthritis pain, lower extremity edema.  Remainder of review  of systems are negative, except as mentioned in the HPI and problem  list.  For full details, please see the intake sheet, which I have  reviewed.   PAST MEDICAL HISTORY:  1. Coronary artery disease.      a.     Status post angioplasty in 1999 by Dr. Deborah Chalk.      b.     Preoperative Myoview in 2006 was negative.  2.  Hypertension.  3. Hyperlipidemia  4. Morbid obesity.  5. Presumed obstructive sleep apnea.  6. Arthritis.  7. Erectile dysfunction.   CURRENT MEDICATIONS:  1. Aspirin 325.  2. Xanax.  3. Celebrex.  4. Hydrocodone/APAP.  5. He was previously on Lipitor and Toprol but stopped these.   HE HAS NO KNOWN DRUG ALLERGIES.   SOCIAL HISTORY:  He is a widower.  He lives near [place].  He  smokes about 1 pack a day for 50 years.  Denies any alcohol use.   FAMILY HISTORY:  Mother died at 4 from natural causes.  Father died in  World War II.  He has one sister who is healthy.  There is no family  history of premature coronary artery disease.   PHYSICAL EXAMINATION:  GENERAL:  He ambulates around the clinic without  any respiratory difficulty.  He is quite a large man.  VITAL SIGNS:  Blood pressure is 124/78, heart rate 70, weight is 283.  HEENT:  Normal, except for prominent xanthelasmas.  NECK:  Thick and supple.  He does not seem to have significant JVD, but  it is hard to tell.  Carotids are 2+ bilaterally, without bruits.  There  is no lymphadenopathy or thyromegaly.  LUNGS:  Remarkably clear.  He does not have any wheezing, no rales.  ABDOMEN:  Markedly obese.  No apparent hepatosplenomegaly.  No bruits,  no masses appreciated.  Good bowel sounds.  EXTREMITIES:  Warm, with no cyanosis or clubbing.  He has about 1+ edema  throughout his lower extremities.  He has multiple tattoos over his  arms.  Distal pulses are not well palpable.  NEUROLOGIC:  Alert and oriented x3.  Cranial nerves II-XII are intact.  Moves all four extremities without difficulty.  Affect is pleasant.   EKG shows sinus rhythm, occasional PVCs, at a rate of 75.  Echocardiogram done recently showed normal ejection fraction.  There was  mild to moderate left atrial enlargement.  The RV was reportedly normal.  No significant valvular disease.   ASSESSMENT AND PLAN:  1. Lower extremity edema.  I suspect this is  multifactorial.  Given      his left atrial dimension, I suspect he has a significant component      of diastolic dysfunction.  He also likely has significant venous      insufficiency.  At this point, we will go ahead and book      compression stockings on him.  We will also give him Lasix 20 mg a      day, with potassium 20 mEq a day, to see if this helps.  We will      bring him back in about a week for a BMET to make sure his renal      function is stable.  2. Probable obstructive sleep apnea.  Based on his body habitus and      fatigue, I think  he clearly has sleep apnea.  I suspect this may      be also contributing to his lower extremity edema, although his RV      looks okay.  I have once again asked him to try a repeat sleep      study.  We will send him to Dr. Craige Cotta for further evaluation.  I      have also stressed to him the importance to lose weight.  3. Hypertension, well controlled.  4. Hyperlipidemia.  Given his history of coronary artery disease, he      needs to be back on a statin., and we will start him back on Zocor      40 mg a day.   DISPOSITION:  We will see him back in 2 months for routine followup.     Bevelyn Buckles. Bensimhon, MD  Electronically Signed    DRB/MedQ  DD: 02/01/2008  DT: 02/01/2008  Job #: [id number]   cc:   Coralyn Helling, MD

## 2011-01-12 NOTE — Assessment & Plan Note (Signed)
[facility] OFFICE NOTE   NAME:[name] [name]                    MRN:          [id number]  DATE:04/05/2008                            DOB:          1940/10/17    PRIMARY CARE PHYSICIAN:  Ramon Dredge L. Juanetta Gosling, M.D.   INTERVAL HISTORY:  [name] [name] is a very pleasant 70 year old male with  multiple medical problems including coronary artery disease, status post  angioplasty by Dr. Deborah Chalk in 1999.  He had a Myoview in 2006 which was  normal.  He also has a history of morbid obesity, sleep apnea,  hypertension, hyperlipidemia, severe back pain, and ongoing tobacco use.   He returns today for followup.  We saw him about a month and half ago  for dyspnea and lower extremity edema.  We tried him on Lasix but this  did not help much at all and so he stopped it.  He says he still gets  short of breath at times, but feels he is somewhat better.  His main  complaint is really the problems he is having with back pain.  He has  not had any chest pain.  He is scheduled for injections of his back to  see if these help.   We did send him over to Dr. Craige Cotta for possible sleep study.  He has been  started empirically on CPAP, he had a hard time tolerating this sleep  study previously.   REVIEW OF SYSTEMS:  He denies any orthopnea, and no PND.  He does have  some persistent lower extremity edema.  No claudication.  Remainder of  review of systems is negative for HPI and problem list.   CURRENT MEDICATIONS:  1. Xanax 2 mg a day.  2. Celebrex 2 mg a day.  3. Vicodin.  4. He has stopped his in preparation for his injections.   PHYSICAL EXAMINATION:  GENERAL:  He is in no acute distress.  He is  somewhat chronically ill appearing.  He ambulates around the clinic  without much respiratory difficulty.  VITAL SIGNS:  Blood pressure is 118/78, weight is 297 which is up about  27 pounds over the past couple of years.  HEENT:  Normal  except for some xanthelasma.  NECK:  Supple.  No obvious JVD, but it is hard to tell given his girth.  Carotids are 2+ bilateral without bruits.  There is no lymphadenopathy  or thyromegaly.  CARDIAC:  PMI is nonpalpable.  He is regular with distant heart sounds.  No obvious murmur.  LUNGS:  Clear with a mildly prolonged expiratory phase.  No wheezing.  ABDOMEN:  Markedly obese, nontender, and nondistended.  No obvious  hepatosplenomegaly, though it is hard to tell.  No masses or bruits.  Good bowel sounds.  EXTREMITIES:  Warm.  No cyanosis or clubbing.  There is trace edema with  marked varicose veins bilaterally.  NEUROLOGIC:  Alert and x3.  Cranial nerves II-XII are intact.  Moves all  four extremities without difficulty.  Affect is pleasant.   ASSESSMENT/PLAN:  1. Coronary artery disease, is stable.  No evidence  of ischemia.  2. Lower extremity edema.  I suspect this is primarily due to venous      insufficiency with maybe some diastolic dysfunction on top of it.      I talked him about possibly going to a Vein Clinic, but he is not      interested at this time.  3. Hypertension, well controlled.  4. Tobacco use.  I had a long talk with him about the need for smoking      cessation, and actually we have started him on a Chantix starter      kit.  He will follow up with Dr. Juanetta Gosling.  5. Lipids.  He has been off his Lipitor for many years.  Given his      coronary artery disease, he needs to be on a statin and get his LDL      below 70.  We will start him on lovastatin 20, and will get his      lipid panel when he is fasting.     Bevelyn Buckles. Bensimhon, MD  Electronically Signed    DRB/MedQ  DD: 04/05/2008  DT: 04/06/2008  Job #: [id number]   cc:   Ramon Dredge L. Juanetta Gosling, M.D.
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
## 2011-01-15 NOTE — Procedures (Signed)
[name] [name], [name] [name]             ACCOUNT NO.:  [id number]   MEDICAL RECORD NO.:  [id number]          PATIENT TYPE:  OUT   LOCATION:  SLEEP CENTER                 FACILITY:  [facility]   PHYSICIAN:  Clinton D. Maple Hudson, M.D. DATE OF BIRTH:  01/30/1941   DATE OF STUDY:                              NOCTURNAL POLYSOMNOGRAM   REFERRING PHYSICIAN:  Dr. Kari Baars.   DATE OF STUDY:  November 03, 2005.   INDICATION FOR STUDY:  Insomnia with sleep apnea.   EPWORTH SLEEPINESS SCORE:  2/24.   BMI:  33.   WEIGHT:  238 pounds.   HOME MEDICATIONS:  Lunesta, Vicodin.   SLEEP ARCHITECTURE:  Total sleep time short, 236 minutes with sleep  efficiency 64%. Stage I was 18%, stage II 75%, stages III and IV absent, REM  7% of total sleep time. Sleep latency 28 minutes, REM latency 283 minutes,  awake after sleep onset 102 minutes reflecting repeated intervals of  wakefulness. Arousal index 107 per hour indicating marked fragmentation.  Lunesta was taken at 10:30 p.m.   RESPIRATORY DATA:  Apnea/hypopnea index (AHI, RDI) 93.1 per hour indicating  severe obstructive sleep apnea/hypopnea syndrome. This included 1 central  apnea, 130 obstructive apneas and 236 hypopneas. Events were not positional.  REM AHI 78.9 per hour. C-PAP titration by split protocol could not be  utilized on the study night because of the patient's inability to maintain  sufficient sleep to meet diagnostic criteria before time ran out.   OXYGEN DATA:  Very loud snoring with oxygen desaturation to a nadir of 72%.  Mean oxygen saturation through the study was 93% on room air.   CARDIAC DATA:  Normal sinus rhythm.   MOVEMENT/PARASOMNIA:  A total of 67 limb jerks were reported of which 20  were associated with arousal or awakening for periodic limb movement with  arousal index of 5.1 per hour which is increased. Bathroom x1.   IMPRESSION/RECOMMENDATIONS:  1.  Severe obstructive sleep apnea/hypopnea syndrome, AHI 93.1 obstructive   events per hour with nonpositional events, loud snoring and oxygen      desaturation to a nadir of 72%.  2.  Marked sleep fragmentation and repeated awakenings secondary to apnea      events.  3.  Consider return for C-PAP titration, bringing a sleep medication.      Otherwise evaluate for alternative therapies      as appropriate.  4.  Periodic limb movement with arousal, 5.1 per hour.      Clinton D. Maple Hudson, M.D.  Diplomate, Biomedical engineer of Sleep Medicine  Electronically Signed     CDY/MEDQ  D:  11/07/2005 16:52:21  T:  11/08/2005 21:00:31  Job:  [id number]

## 2011-01-15 NOTE — Op Note (Signed)
[name], [name] [name]             ACCOUNT NO.:  [id number]   MEDICAL RECORD NO.:  [id number]          PATIENT TYPE:  INP   LOCATION:  5017                         FACILITY:  [facility]   PHYSICIAN:  Thera Flake., M.D.DATE OF BIRTH:  1941/02/08   DATE OF PROCEDURE:  10/09/2004  DATE OF DISCHARGE:                                 OPERATIVE REPORT   PREOPERATIVE DIAGNOSIS:  Osteoarthritis, left knee.   POSTOPERATIVE DIAGNOSIS:  Osteoarthritis, left knee.   OPERATION:  Left total knee replacement (cemented LCS large with size 6 tray  and 50 mm bearing).   SURGEON:  Dyke Brackett, M.D.   ASSISTANT:  Legrand Pitts. Duffy, P.A.   TOURNIQUET TIME:  1 hour 5 minutes.   DESCRIPTION OF PROCEDURE:  Sterile prep and drape, exsanguination of the  leg, inflation of the tourniquet to 400.  Straight skin incision medial  parapatellar approach made with cutting of the tibia to a 7 degree slope  followed by an anterior posterior cut with a flexion gap measured at 50 mm.  Symmetric joint space was noted with no undue ligamentous releases required.  Distal femoral cut was 5 degrees valgus cut followed by chamfer cuts for the  prosthesis.  The tibia was next sized and excess menisci was removed from  the posterior aspect of the knee.  The PCL was released.  Keel hole was made  for the tibial tray with a size 6 tibia followed by cutting leaving about 18  mm of native patella.  The three peg patella was used.  Trial reduction was  carried out, excellent extension, good symmetry of motion, no undue tension  on the bearing, and a slight drawer with no tendency for bearing spin out.  Trial components were removed followed by copious irrigation.  The  components were cemented in, tibia, followed by femur and patella, with 750  mg Cefuroxime per bag of cement, two bags of cement were used.  The cement  was allowed to harden, excess cement was removed.  The trial bearing was  removed.  Again, there was no undue  bleeding from the back of the knee noted  with the tourniquet released.  There were small amounts of cement removed  from the back of the knee.  The final bearing was placed and motion placed  and found to be excellent with no instability noted, good balance of the  ligaments was noted, as well.  Closure was effected on the capsule with #1  Ethibond, 2-0 Vicryl, skin clips.  Marcaine with epinephrine in the skin.  A  Hemovac drain was placed.  He was taken to the recovery room in stable  condition.      WDC/MEDQ  D:  10/09/2004  T:  10/09/2004  Job:  [id number]

## 2011-01-15 NOTE — H&P (Signed)
[name] [name]             ACCOUNT NO.:  [id number]   MEDICAL RECORD NO.:  [id number]          PATIENT TYPE:  OUT   LOCATION:  VASC                         FACILITY:  [facility]   PHYSICIAN:  Dyke Brackett, M.D.    DATE OF BIRTH:  1941-02-20   DATE OF ADMISSION:  09/03/2004  DATE OF DISCHARGE:  09/03/2004                                HISTORY & PHYSICAL   CHIEF COMPLAINT:  Left knee pain.   HISTORY OF PRESENT ILLNESS:  The patient is a 70 year old gentleman with  about a 7-8 year progressively worsening left knee pain and difficulty with  use.  The patient states he has significant moderate to severe increased  pain with any attempts at ambulating.  He describes it as a sharp, stabbing  type pain with radiation up into the hip.  He does have significant swelling  at times.  He does have night pain.  The knee does want to give out at  times.  He does have mechanical symptoms like grinding.  He has noted a  deformity in the knee over the last four years.  He denies any previous  injuries.  X-rays reveal decreased joint spacing medial and laterally with  flattening of the femoral condyles, large spurs throughout.   ALLERGIES:  No known drug allergies.   CURRENT MEDICATIONS:  1.  Vicodin p.r.n.  2.  Lipitor 20 mg p.o. daily.  3.  Toprol 20 mg p.o. daily, patient unsure of dosing, will check prior to      hospital.  4.  Aspirin 325 mg p.o. daily.   PAST MEDICAL HISTORY:  1.  Hypertension.  2.  Coronary artery disease with angioplasty 12 years previous, last cardiac      workup six years previous.  3.  Hypercholesterolemia.   PAST SURGICAL HISTORY:  1.  Cardiac catheterization.  2.  Venous stripping.  3.  Hemorrhoidectomy.  4.  Patient denies any complications with the above-mentioned surgical      procedures.   SOCIAL HISTORY:  The patient is a healthy-appearing, well-developed,  physically fit 70 year old white male.  He smokes about half a pack a day at  the present.   He has recently been working down since a pack and a half.  He  denies any alcohol.  He is widowed.  He currently is disabled due to his  leg.   FAMILY PHYSICIAN:  Carylon Perches, M.D.   FAMILY MEDICAL HISTORY:  Mother is deceased at 40 years of age from natural  causes.  Father is deceased, killed in World War II.  She has a half sister  who is in good medical health.   REVIEW OF SYSTEMS:  Positive for upper and lower dentures.  She does wear  glasses while driving.  Otherwise, all the review of systems categories are  negative and noncontributory at this time when reviewed.   PHYSICAL EXAMINATION:  VITAL SIGNS:  Height 5 feet 11 inches, weight 250  pounds, blood pressure 140/88, respirations 16, pulse 80 and regular, the  patient is afebrile.  GENERAL:  This is a healthy-appearing, muscular 70 year old white male.  He  has obvious valgus deformity of his left leg.  He requires to push up off  the chair to ambulate.  He walks with a significant limp.  HEENT:  Head was normocephalic.  Pupils are equal, round, and reactive,  extraocular movements intact.  Oral buccal mucosa was pink and moist.  External ears without deformities, gross hearing is intact.  NECK:  Supple, no palpable lymphadenopathy.  The thyroid region was  nontender.  He had good range of motion of his cervical spine without any  difficulty or tenderness.  CHEST:  Lung sounds were clear and equal bilaterally, no wheezes, rales, or  rhonchi.  HEART:  Regular rate and rhythm, no murmurs, rubs, or gallops.  ABDOMEN:  Round, soft, nontender, bowel sounds normoactive, no CVA region  tenderness.  EXTREMITIES:  The patient had full range of motion of his shoulders, elbows,  and wrists.  He was a little sore in his right shoulder from a recent injury  but motor strength was 5/5.  Lower extremities:  Right and left hip had full  extension, flexion up to 120 degrees with 20 degrees internal/external  rotation.  Right knee had no  signs of erythema or ecchymosis.  They had no  palpable effusion.  He had full extension/flexion back to 120 degrees with  no instability.  The left knee had no effusions.  He had full extension.  He  was able to flex it back to about 115 degrees.  He had about a 12-degree  valgus deformity.  He opened up laterally with about 5 degrees with  stressing.  Calves were nontender.  Ankles were symmetrical with good  dorsiflexion and plantar flexion.  Peripheral vascular:  Carotid pulses were  2+, no bruits.  Radial pulses were 2+, unable to palpate any lower extremity  pulses but a recent lower extremity arterial evaluation had normal ABIs due  to unable to palpate pulses in the lower extremities.  NEUROLOGIC:  The patient was conscious, alert, and appropriate, ease of  conversation with examiner.  Cranial nerves II-XII were grossly intact.  He  had no gross neurologic defects noted.  RECTAL/GU:  Deferred at this time.   IMPRESSION:  1.  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  2.  Hypertension.  3.  Hypercholesterolemia.  4.  History of coronary artery disease with angioplasty 12 years previous.   PLAN:  The patient will undergo all routine labs and tests prior to having a  left total knee arthroplasty by Dr. Madelon Lips.       RWK/MEDQ  D:  09/28/2004  T:  09/28/2004  Job:  [id number]

## 2011-02-01 ENCOUNTER — Other Ambulatory Visit (HOSPITAL_COMMUNITY): Payer: Self-pay | Admitting: Orthopedic Surgery

## 2011-02-01 DIAGNOSIS — Q759 Congenital malformation of skull and face bones, unspecified: Secondary | ICD-10-CM

## 2011-02-01 DIAGNOSIS — M48061 Spinal stenosis, lumbar region without neurogenic claudication: Secondary | ICD-10-CM

## 2011-02-05 ENCOUNTER — Other Ambulatory Visit (HOSPITAL_COMMUNITY): Payer: Self-pay

## 2011-02-08 ENCOUNTER — Ambulatory Visit (HOSPITAL_COMMUNITY)
Admission: RE | Admit: 2011-02-08 | Discharge: 2011-02-08 | Disposition: A | Discharge status: Home / Self Care | Payer: Medicare HMO | Source: Ambulatory Visit | Attending: Orthopedic Surgery | Admitting: Orthopedic Surgery

## 2011-02-08 DIAGNOSIS — M48061 Spinal stenosis, lumbar region without neurogenic claudication: Secondary | ICD-10-CM

## 2011-02-08 DIAGNOSIS — M545 Low back pain, unspecified: Secondary | ICD-10-CM | POA: Insufficient documentation

## 2011-02-08 DIAGNOSIS — Q759 Congenital malformation of skull and face bones, unspecified: Secondary | ICD-10-CM

## 2011-02-08 DIAGNOSIS — M47817 Spondylosis without myelopathy or radiculopathy, lumbosacral region: Secondary | ICD-10-CM | POA: Insufficient documentation

## 2011-02-08 LAB — CREATININE, SERUM
Creatinine, Ser: 0.99 mg/dL (ref 0.4–1.5)
GFR calc Af Amer: >60 mL/min (ref >60)

## 2011-02-08 MED ORDER — GADOBENATE DIMEGLUMINE 529 MG/ML IV SOLN
20.0000 mL | Freq: Once | INTRAVENOUS | Status: AC | PRN
  Administered 2011-02-08: 20 mL via INTRAVENOUS

## 2011-02-19 ENCOUNTER — Encounter: Payer: Self-pay | Admitting: Cardiovascular Disease

## 2011-04-07 ENCOUNTER — Other Ambulatory Visit: Payer: Self-pay | Admitting: Pulmonary Disease

## 2011-04-07 MED ORDER — TRAZODONE HCL 100 MG PO TABS: 100.0000 mg | ORAL_TABLET | Freq: Every day | ORAL | Status: DC

## 2011-04-07 NOTE — Telephone Encounter (Signed)
Refill request from [facility] [address] for trazodone 100mg  1 qhs prn sleep.  Pt last seen by VS 1.19.12, told to follow up in 4 months.  No pending appts.  Pt given 1 refill with a note to pharmacy that pt is "overdue for appt with VS - must schedule and keep appt for refills."  Faxed back to pharmacy.  Med list updated.

## 2011-05-12 ENCOUNTER — Other Ambulatory Visit: Payer: Self-pay | Admitting: *Deleted

## 2011-05-12 MED ORDER — TRAZODONE HCL 100 MG PO TABS: 100.0000 mg | ORAL_TABLET | Freq: Every day | ORAL | Status: DC

## 2011-07-07 ENCOUNTER — Other Ambulatory Visit: Payer: Self-pay | Admitting: Pulmonary Disease

## 2011-07-07 MED ORDER — TRAZODONE HCL 100 MG PO TABS: 100.0000 mg | ORAL_TABLET | Freq: Every day | ORAL | Status: DC

## 2011-07-07 NOTE — Telephone Encounter (Signed)
I spoke with pt and he states he needs a refill on his trazodone 100 mg to [address]. I advised pt he was last seen 09/17/10 and told to f/u in 4 months. I advised pt will send rx then he needs to keep OV for further refills. Pt states he will. Nothing further was needed and rx was sent.

## 2011-07-29 ENCOUNTER — Ambulatory Visit (INDEPENDENT_AMBULATORY_CARE_PROVIDER_SITE_OTHER): Payer: Medicare HMO | Admitting: Pulmonary Disease

## 2011-07-29 ENCOUNTER — Encounter: Payer: Self-pay | Admitting: Pulmonary Disease

## 2011-07-29 DIAGNOSIS — G4733 Obstructive sleep apnea (adult) (pediatric): Secondary | ICD-10-CM

## 2011-07-29 DIAGNOSIS — G2581 Restless legs syndrome: Secondary | ICD-10-CM

## 2011-07-29 DIAGNOSIS — Z79899 Other long term (current) drug therapy: Secondary | ICD-10-CM | POA: Insufficient documentation

## 2011-07-29 DIAGNOSIS — G4700 Insomnia, unspecified: Secondary | ICD-10-CM

## 2011-07-29 DIAGNOSIS — Z01811 Encounter for preprocedural respiratory examination: Secondary | ICD-10-CM

## 2011-07-29 MED ORDER — ALPRAZOLAM 2 MG PO TABS: 2.0000 mg | ORAL_TABLET | Freq: Every evening | ORAL | Status: DC | PRN

## 2011-07-29 MED ORDER — TRAZODONE HCL 100 MG PO TABS: 100.0000 mg | ORAL_TABLET | Freq: Every day | ORAL | Status: DC

## 2011-07-29 MED ORDER — ROPINIROLE HCL 1 MG PO TABS: 2.0000 mg | ORAL_TABLET | Freq: Every day | ORAL | Status: DC

## 2011-07-29 NOTE — Patient Instructions (Signed)
Follow up in 6 months 

## 2011-07-29 NOTE — Assessment & Plan Note (Signed)
I have advised him that his sleep apnea would not preclude him from having surgery, and he can proceed with knee surgery.  Advised him to notify his surgical team about his history of sleep apnea.  He will need to have close monitoring of his oxygen during the post-operative period.  He will need to continue with CPAP in the post-operative period.

## 2011-07-29 NOTE — Assessment & Plan Note (Signed)
Will refill trazodone and alprazolam.

## 2011-07-29 NOTE — Progress Notes (Signed)
Chief Complaint  Patient presents with  . Follow-up    pt has requested to DME to change mask from nasal mask to full face mask. c/o not sleeping "good"    History of Present Illness: [name] [name] is a 70 y.o. male severe OSA on CPAP 12, insomnia and RLS.  He recently got a new CPAP mask.  This has helped.    He takes alprazolam, trazodone, and requip at 930 pm.  He falls asleep about 30 minutes later.  He sleeps through the night.  He wakes up at 8 am.  He feels rested in the morning.  His legs symptoms are controlled.  He is being evaluated for right knee replacement surgery.  Past Medical History  Diagnosis Date  . Hypertension   . Hyperlipidemia   . Rhinitis   . Peripheral edema   . Osteoarthritis   . Obesity     morbid  . CAD (coronary artery disease)     s/p angioplasty  . ED (erectile dysfunction)   . OSA (obstructive sleep apnea)     severe, on CPAP 12  . Restless leg syndrome   . Insomnia   . Anxiety     Past Surgical History  Procedure Date  . Total knee arthroplasty     left  . Hemorrhoid surgery   . Back surgery 10/2009    Current Outpatient Prescriptions on File Prior to Visit  Medication Sig Dispense Refill  . celecoxib (CELEBREX) 200 MG capsule Take 200 mg by mouth 2 (two) times daily.        Marland Kitchen lovastatin (ALTOPREV) 20 MG 24 hr tablet Take 20 mg by mouth at bedtime.        Marland Kitchen oxyCODONE (OXYCONTIN) 15 MG TB12 Take 15 mg by mouth 3 (three) times daily.          No Known Allergies  Physical Exam:  Blood pressure 138/82, pulse 71, temperature 98.6 F (37 C), temperature source Oral, height 6' (1.829 m), weight 303 lb 12.8 oz (137.803 kg), SpO2 96.00%. Body mass index is 41.20 kg/(m^2).  General - Obese HEENT - No sinus tenderness, no oral exudate, no LAN Cardiac - s1s2 regular, no murur Chest - no wheeze/rales Abdomen - soft, nontender Extremities - no edema Skin - no rashes Neurologic - normal strength Psychiatric - normal mood,  behavior  Assessment/Plan:  SLEEP APNEA, OBSTRUCTIVE He is doing better with new mask.  RESTLESS LEG SYNDROME Stable on requip.  INSOMNIA Will refill trazodone and alprazolam.  Pre-operative respiratory examination I have advised him that his sleep apnea would not preclude him from having surgery, and he can proceed with knee surgery.  Advised him to notify his surgical team about his history of sleep apnea.  He will need to have close monitoring of his oxygen during the post-operative period.  He will need to continue with CPAP in the post-operative period.     Outpatient Encounter Prescriptions as of 07/29/2011  Medication Sig Dispense Refill  . alprazolam (XANAX) 2 MG tablet Take 1 tablet (2 mg total) by mouth at bedtime as needed for sleep or anxiety.  30 tablet  3  . celecoxib (CELEBREX) 200 MG capsule Take 200 mg by mouth 2 (two) times daily.        Marland Kitchen lovastatin (ALTOPREV) 20 MG 24 hr tablet Take 20 mg by mouth at bedtime.        Marland Kitchen oxyCODONE (OXYCONTIN) 15 MG TB12 Take 15 mg by mouth 3 (three) times daily.        Marland Kitchen  rOPINIRole (REQUIP) 1 MG tablet Take 2 tablets (2 mg total) by mouth at bedtime.  60 tablet  5  . traZODone (DESYREL) 100 MG tablet Take 1 tablet (100 mg total) by mouth at bedtime.  30 tablet  5  . DISCONTD: alprazolam (XANAX) 2 MG tablet Take 2 mg by mouth at bedtime as needed.        Marland Kitchen DISCONTD: rOPINIRole (REQUIP) 1 MG tablet Take 2 mg by mouth at bedtime.        Marland Kitchen DISCONTD: traZODone (DESYREL) 100 MG tablet Take 1 tablet (100 mg total) by mouth at bedtime.  30 tablet  0    Bradrick Kamau Pager:  [phone] 07/29/2011, 3:41 PM

## 2011-07-29 NOTE — Assessment & Plan Note (Signed)
He is doing better with new mask.

## 2011-07-29 NOTE — Assessment & Plan Note (Signed)
Stable on requip.  

## 2011-10-28 ENCOUNTER — Telehealth: Payer: Self-pay | Admitting: Pulmonary Disease

## 2011-10-28 NOTE — Telephone Encounter (Signed)
[facility]  Alprazolam 2mg  take 1 tablet by mouth at bedtime as needed for sleep or anxiety  #30 X3 No Known Allergies Dr Craige Cotta is this ok to fill

## 2011-10-29 MED ORDER — ALPRAZOLAM 2 MG PO TABS: 2.0000 mg | ORAL_TABLET | Freq: Every evening | ORAL | Status: DC | PRN

## 2011-10-29 NOTE — Telephone Encounter (Signed)
Spoke with pt and advised that rx refill for alprazolam was called to pharmacy. He verbalized understanding and states nothing further needed.

## 2011-10-29 NOTE — Telephone Encounter (Signed)
Yes, it is okay to refill script.

## 2011-10-29 NOTE — Telephone Encounter (Signed)
Pt called back requesting to get rx for alprazolam called in asap today- [facility] [address] Pt # [phone]. Hazel Sams

## 2011-11-23 ENCOUNTER — Other Ambulatory Visit (HOSPITAL_COMMUNITY): Payer: Self-pay | Admitting: Orthopedic Surgery

## 2011-11-23 DIAGNOSIS — M25511 Pain in right shoulder: Secondary | ICD-10-CM

## 2011-11-26 ENCOUNTER — Ambulatory Visit (HOSPITAL_COMMUNITY)
Admission: RE | Admit: 2011-11-26 | Discharge: 2011-11-26 | Disposition: A | Discharge status: Home / Self Care | Payer: Medicare HMO | Source: Ambulatory Visit | Attending: Orthopedic Surgery | Admitting: Orthopedic Surgery

## 2011-11-26 DIAGNOSIS — M719 Bursopathy, unspecified: Secondary | ICD-10-CM | POA: Insufficient documentation

## 2011-11-26 DIAGNOSIS — M67919 Unspecified disorder of synovium and tendon, unspecified shoulder: Secondary | ICD-10-CM | POA: Insufficient documentation

## 2011-11-26 DIAGNOSIS — M7520 Bicipital tendinitis, unspecified shoulder: Secondary | ICD-10-CM | POA: Insufficient documentation

## 2011-11-26 DIAGNOSIS — M25511 Pain in right shoulder: Secondary | ICD-10-CM

## 2011-11-26 DIAGNOSIS — M25519 Pain in unspecified shoulder: Secondary | ICD-10-CM | POA: Insufficient documentation

## 2011-11-26 DIAGNOSIS — M19019 Primary osteoarthritis, unspecified shoulder: Secondary | ICD-10-CM | POA: Insufficient documentation

## 2012-01-08 ENCOUNTER — Other Ambulatory Visit: Payer: Self-pay | Admitting: Pulmonary Disease

## 2012-01-11 ENCOUNTER — Telehealth: Payer: Self-pay | Admitting: Pulmonary Disease

## 2012-01-11 NOTE — Telephone Encounter (Signed)
lmomtcb x1 for pt 

## 2012-01-11 NOTE — Telephone Encounter (Signed)
Please advise if ok to refill. Thanks 

## 2012-01-12 NOTE — Telephone Encounter (Signed)
Called, spoke with pt who states he had right shoulder surgery x 1 wk ago.  He has needed to increase xanax to 2 of the 2mg  tablets every night d/t pain to help him sleep.  He would like to [facility]ax rx [facility] [address] so he can pick up more tablets to cover this.  Reports once this is over with, he plans to go back to 1 tablet qhs.  Also, states the trazodone is not working anymore - reports it is like "a BC."  He does have a pending OV with Dr. Craige Cotta on May 20 at 2:45 which he is planning on keeping.  Dr. Craige Cotta, pls advise.  Thank you.

## 2012-01-12 NOTE — Telephone Encounter (Signed)
Called and spoke with pt and he did talk with the surgeon about his pain and the surgeon is the one that told the pt to double the pain med and increase the alprazolam. He stated that he knows the alprazolam is not for pain but it helps him to sleep with the pain. He will talk with VS on Monday at his appt at 2:45.

## 2012-01-12 NOTE — Telephone Encounter (Signed)
Please explain to him that xanax is not a pain medication, and should not be used as such.    He needs to call his surgeon who did his shoulder surgery, and discuss more appropriate medications for his pain.    He needs to continue his current dose of xanax as prescribed at his last visit with me.

## 2012-01-17 ENCOUNTER — Encounter: Payer: Self-pay | Admitting: Pulmonary Disease

## 2012-01-17 ENCOUNTER — Ambulatory Visit (INDEPENDENT_AMBULATORY_CARE_PROVIDER_SITE_OTHER): Payer: Medicare Other | Admitting: Pulmonary Disease

## 2012-01-17 VITALS — BP 132/72 | HR 75 | Temp 98.7°F | Ht 72.0 in | Wt 300.4 lb

## 2012-01-17 DIAGNOSIS — G4733 Obstructive sleep apnea (adult) (pediatric): Secondary | ICD-10-CM

## 2012-01-17 DIAGNOSIS — G2581 Restless legs syndrome: Secondary | ICD-10-CM

## 2012-01-17 DIAGNOSIS — G4700 Insomnia, unspecified: Secondary | ICD-10-CM

## 2012-01-17 MED ORDER — ALPRAZOLAM 2 MG PO TABS: 4.0000 mg | ORAL_TABLET | Freq: Every evening | ORAL | Status: DC | PRN

## 2012-01-17 MED ORDER — ALPRAZOLAM 2 MG PO TABS: 4.0000 mg | ORAL_TABLET | Freq: Every evening | ORAL | Status: AC | PRN

## 2012-01-17 NOTE — Assessment & Plan Note (Signed)
Stable on requip.  

## 2012-01-17 NOTE — Assessment & Plan Note (Signed)
He has noticed more trouble with his sleep after shoulder surgery.  Will temporarily increase his xanax to 4 mg qhs until he has recovered from his shoulder surgery.  Will then plan to go back to 2 mg per day dose.

## 2012-01-17 NOTE — Patient Instructions (Signed)
Follow up in 6 months 

## 2012-01-17 NOTE — Assessment & Plan Note (Signed)
He needs to get set up with a new DME company due to change in his insurance.

## 2012-01-17 NOTE — Progress Notes (Signed)
Chief Complaint  Patient presents with  . Follow-up    Pt states he wears his cpap machine everynight x 7-9 hrs a night. Pt needs new mask. denies any problems with machine. Pt state he has to take 2 alprazolam before he can sleep good.    History of Present Illness: [name] [name] is a 71 y.o. male severe OSA on CPAP 12, insomnia and RLS.  He has needed to use xanax more to help him sleep since he had his right shoulder surgery.  He is doing well with CPAP.  He is not having any trouble with his leg symptoms.  Trazodone did not help his sleep, and he is no longer taking this.  Past Medical History  Diagnosis Date  . Hypertension   . Hyperlipidemia   . Rhinitis   . Peripheral edema   . Osteoarthritis   . Obesity     morbid  . CAD (coronary artery disease)     s/p angioplasty  . ED (erectile dysfunction)   . OSA (obstructive sleep apnea)     severe, on CPAP 12  . Restless leg syndrome   . Insomnia   . Anxiety     Past Surgical History  Procedure Date  . Total knee arthroplasty     left  . Hemorrhoid surgery   . Back surgery 10/2009   No Known Allergies  Physical Exam:  Blood pressure 132/72, pulse 75, temperature 98.7 F (37.1 C), temperature source Oral, height 6' (1.829 m), weight 300 lb 6.4 oz (136.261 kg), SpO2 97.00%.  Body mass index is 40.74 kg/(m^2).   General - Obese HEENT - No sinus tenderness, no oral exudate, no LAN Cardiac - s1s2 regular, no murur Chest - no wheeze/rales Abdomen - soft, nontender Extremities - Rt arm in a sling Skin - no rashes Neurologic - normal strength Psychiatric - normal mood, behavior  Assessment/Plan:    Outpatient Encounter Prescriptions as of 01/17/2012  Medication Sig Dispense Refill  . alprazolam (XANAX) 2 MG tablet Take 4 mg by mouth at bedtime.      . celecoxib (CELEBREX) 200 MG capsule Take 200 mg by mouth 2 (two) times daily.        Marland Kitchen lovastatin (ALTOPREV) 20 MG 24 hr tablet Take 20 mg by mouth at  bedtime.        Marland Kitchen oxyCODONE (OXYCONTIN) 15 MG TB12 Take 15 mg by mouth 3 (three) times daily.        Marland Kitchen rOPINIRole (REQUIP) 1 MG tablet Take 2 tablets (2 mg total) by mouth at bedtime.  60 tablet  5  . DISCONTD: alprazolam (XANAX) 2 MG tablet Take 1 tablet (2 mg total) by mouth at bedtime as needed for sleep or anxiety.  30 tablet  3  . DISCONTD: traZODone (DESYREL) 100 MG tablet TAKE 1 TABLET BY MOUTH AT BEDTIME  30 tablet  5    [name] [name] Pager:  [phone] 01/17/2012, 3:20 PM

## 2012-02-14 ENCOUNTER — Other Ambulatory Visit: Payer: Self-pay | Admitting: Pulmonary Disease

## 2012-03-13 ENCOUNTER — Ambulatory Visit: Payer: Medicare Other | Attending: Orthopedic Surgery | Admitting: Physical Therapy

## 2012-03-13 DIAGNOSIS — M25619 Stiffness of unspecified shoulder, not elsewhere classified: Secondary | ICD-10-CM | POA: Insufficient documentation

## 2012-03-13 DIAGNOSIS — IMO0001 Reserved for inherently not codable concepts without codable children: Secondary | ICD-10-CM | POA: Insufficient documentation

## 2012-03-13 DIAGNOSIS — R293 Abnormal posture: Secondary | ICD-10-CM | POA: Insufficient documentation

## 2012-03-13 DIAGNOSIS — M25519 Pain in unspecified shoulder: Secondary | ICD-10-CM | POA: Insufficient documentation

## 2012-03-13 DIAGNOSIS — M6281 Muscle weakness (generalized): Secondary | ICD-10-CM | POA: Insufficient documentation

## 2012-03-21 ENCOUNTER — Ambulatory Visit: Payer: Medicare Other | Admitting: Rehabilitation

## 2012-03-23 ENCOUNTER — Ambulatory Visit: Payer: Medicare Other | Admitting: Rehabilitation

## 2012-03-28 ENCOUNTER — Ambulatory Visit: Payer: Medicare Other | Admitting: Physical Therapy

## 2012-03-28 ENCOUNTER — Encounter: Payer: Medicare Other | Admitting: Physical Therapy

## 2012-03-30 ENCOUNTER — Encounter: Payer: Medicare Other | Admitting: Physical Therapy

## 2012-04-04 ENCOUNTER — Ambulatory Visit: Payer: Medicare Other | Attending: Pulmonary Disease | Admitting: Rehabilitation

## 2012-04-04 DIAGNOSIS — R293 Abnormal posture: Secondary | ICD-10-CM | POA: Insufficient documentation

## 2012-04-04 DIAGNOSIS — M6281 Muscle weakness (generalized): Secondary | ICD-10-CM | POA: Insufficient documentation

## 2012-04-04 DIAGNOSIS — M25619 Stiffness of unspecified shoulder, not elsewhere classified: Secondary | ICD-10-CM | POA: Insufficient documentation

## 2012-04-04 DIAGNOSIS — M25519 Pain in unspecified shoulder: Secondary | ICD-10-CM | POA: Insufficient documentation

## 2012-04-04 DIAGNOSIS — IMO0001 Reserved for inherently not codable concepts without codable children: Secondary | ICD-10-CM | POA: Insufficient documentation

## 2012-04-11 ENCOUNTER — Ambulatory Visit: Payer: Medicare Other | Admitting: Rehabilitation

## 2012-04-13 ENCOUNTER — Ambulatory Visit: Payer: Medicare Other | Admitting: Physical Therapy

## 2012-04-18 ENCOUNTER — Ambulatory Visit: Payer: Medicare Other | Admitting: Rehabilitation

## 2012-04-20 ENCOUNTER — Encounter: Payer: Medicare Other | Admitting: Rehabilitation

## 2012-04-24 ENCOUNTER — Ambulatory Visit: Payer: Medicare Other | Admitting: Physical Therapy

## 2012-05-02 ENCOUNTER — Encounter: Payer: Medicare Other | Admitting: Physical Therapy

## 2012-05-04 ENCOUNTER — Encounter: Payer: Medicare Other | Admitting: Physical Therapy

## 2012-05-04 ENCOUNTER — Telehealth: Payer: Self-pay | Admitting: Pulmonary Disease

## 2012-05-04 MED ORDER — ALPRAZOLAM 2 MG PO TABS: 2.0000 mg | ORAL_TABLET | Freq: Every day | ORAL | Status: AC

## 2012-05-04 NOTE — Telephone Encounter (Signed)
Pt last seen on 01/17/2012 and due for follow-up in Nov 2013 but appt has not been scheduled. Xanax 2mg  #30 last filled on 04/25/2012. Medication is no longer on pt's current medication list. Pls advise on calling refills to the pharmacy.

## 2012-05-04 NOTE — Telephone Encounter (Signed)
Refill sent. Raylin Diguglielmo, CMA  

## 2012-05-04 NOTE — Telephone Encounter (Signed)
Please send script for xanax 2 mg qhs.  Dispense 30 pills with 3 refills.

## 2012-05-24 ENCOUNTER — Ambulatory Visit (INDEPENDENT_AMBULATORY_CARE_PROVIDER_SITE_OTHER): Payer: Medicare Other | Admitting: Cardiovascular Disease

## 2012-05-24 ENCOUNTER — Encounter: Payer: Self-pay | Admitting: Cardiovascular Disease

## 2012-05-24 ENCOUNTER — Other Ambulatory Visit: Payer: Self-pay | Admitting: *Deleted

## 2012-05-24 VITALS — BP 119/67 | HR 75 | Ht 71.0 in | Wt 294.0 lb

## 2012-05-24 DIAGNOSIS — F17200 Nicotine dependence, unspecified, uncomplicated: Secondary | ICD-10-CM

## 2012-05-24 DIAGNOSIS — Z01810 Encounter for preprocedural cardiovascular examination: Secondary | ICD-10-CM

## 2012-05-24 DIAGNOSIS — Z720 Tobacco use: Secondary | ICD-10-CM

## 2012-05-24 DIAGNOSIS — I2510 Atherosclerotic heart disease of native coronary artery without angina pectoris: Secondary | ICD-10-CM

## 2012-05-24 MED ORDER — ASPIRIN EC 81 MG PO TBEC: 81.0000 mg | DELAYED_RELEASE_TABLET | Freq: Every day | ORAL | Status: DC

## 2012-05-24 NOTE — Patient Instructions (Addendum)
Your physician wants you to follow-up in:  12 months.  You will receive a reminder letter in the mail two months in advance. If you don't receive a letter, please call our office to schedule the follow-up appointment.   

## 2012-05-24 NOTE — Progress Notes (Signed)
History of Present Illness: 71 yo male with history of HTN, HLD, OSA, CAD, tobacco abuse here today for cardiac follow up.  He was last seen by Dossie Arbour in February 2011 in the [place] office but wishes to change to our [place] office. He has a history of coronary artery disease s/p angioplasty in 1999, normal stress test in 2006, essentially normal echocardiogram in 2009. He was cleared for knee surgery in 2011 by Dr. Mariah Milling. He refused a stress test at that time.   Mr. Lorna Dibble states that overall he is doing well. He denies any significant shortness of breath, chest pain. He has no complaints. He is limited in activity because of his knee pain.   Primary Care Physician: Kari Baars  Last Lipid Profile: Followed in primary care.    Past Medical History  Diagnosis Date  . Hypertension   . Hyperlipidemia   . Rhinitis   . Peripheral edema   . Osteoarthritis   . Obesity     morbid  . CAD (coronary artery disease)     s/p angioplasty  . ED (erectile dysfunction)   . OSA (obstructive sleep apnea)     severe, on CPAP 12  . Restless leg syndrome   . Insomnia   . Anxiety     Past Surgical History  Procedure Date  . Total knee arthroplasty     left  . Hemorrhoid surgery   . Back surgery 10/2009    Current Outpatient Prescriptions  Medication Sig Dispense Refill  . AFLURIA PRESERVATIVE FREE injection FLU SHOT      . alprazolam (XANAX) 2 MG tablet Take 1 tablet (2 mg total) by mouth at bedtime.  30 tablet  3  . celecoxib (CELEBREX) 200 MG capsule Take 200 mg by mouth 2 (two) times daily.        Marland Kitchen gabapentin (NEURONTIN) 300 MG capsule TWO TABS TWICE A DAY      . HYDROcodone-acetaminophen (LORTAB) 10-500 MG per tablet As needed      . methocarbamol (ROBAXIN) 500 MG tablet As needed      . tiZANidine (ZANAFLEX) 4 MG tablet 2 pills at night      . traZODone (DESYREL) 100 MG tablet As needed to help sleep      . VOLTAREN 1 % GEL As needed for knee or back pain        No  Known Allergies  History   Social History  . Marital Status: Widowed    Spouse Name: N/A    Number of Children: 2  . Years of Education: N/A   Occupational History  . Disabled, Retired Curator   . RETIRED    Social History Main Topics  . Smoking status: Smoker, Current Status Unknown -- 1.0 packs/day for 55 years    Types: Cigarettes  . Smokeless tobacco: Never Used   Comment: <1 ppd  . Alcohol Use: No  . Drug Use: No  . Sexually Active: Not on file   Other Topics Concern  . Not on file   Social History Narrative  . No narrative on file    Family History  Problem Relation Age of Onset  . Coronary artery disease Neg Hx     Review of Systems:  As stated in the HPI and otherwise negative.   BP 119/67  Pulse 75  Ht 5\' 11"  (1.803 m)  Wt 294 lb (133.358 kg)  BMI 41.00 kg/m2  Physical Examination: General: Well developed, well nourished, NAD HEENT: OP  clear, mucus membranes moist SKIN: warm, dry. No rashes. Neuro: No focal deficits Musculoskeletal: Muscle strength 5/5 all ext Psychiatric: Mood and affect normal Neck: No JVD, no carotid bruits, no thyromegaly, no lymphadenopathy. Lungs:Clear bilaterally, no wheezes, rhonci, crackles Cardiovascular: Regular rate and rhythm. No murmurs, gallops or rubs. Abdomen:Soft. Bowel sounds present. Non-tender.  Extremities: No lower extremity edema. Pulses are 1+ in the bilateral DP/PT.  EKG: NSR, rate 75 bpm. Normal EKG.   Assessment and Plan:   1. Pre-operative examination: He is active and has had no chest pain or change in breathing. He is mostly limited by knee and joint pain. BP is ok. Lipids ok per pt, followed in primary care. EKG normal today. No signs of symptoms of angina, CHF. I have discussed a stress test but he does not wish to pursue. Since he is having no symptoms that are worrisome for unstable angina, will not plan further testing before his planned surgical procedure. I will send a letter of clearance to  Dr. Madelon Lips.   2. HTN: BP controlled  3. Tobacco abuse: Smoking cessation is encouraged.   4. CAD: I have asked him to start taking ASA 81 mg po Qdaily. He has not been on a beta blocker. He is not on a statin. He stopped this in 2009 and does not wish to restart since his "cholesterol is good". Given CAD, would prefer him to be on a statin.   30 minutes were spent reviewing old records, interviewing pt and examining the patient while formulating a plan.

## 2012-06-18 ENCOUNTER — Other Ambulatory Visit: Payer: Self-pay | Admitting: Pulmonary Disease

## 2012-06-22 ENCOUNTER — Other Ambulatory Visit: Payer: Self-pay | Admitting: Pulmonary Disease

## 2012-06-28 ENCOUNTER — Other Ambulatory Visit: Payer: Self-pay | Admitting: Pulmonary Disease

## 2012-06-29 NOTE — Telephone Encounter (Signed)
Please advise if okay to refill Dr. Sood thanks 

## 2012-06-29 NOTE — Telephone Encounter (Signed)
Okay to send refill. 

## 2012-07-20 ENCOUNTER — Ambulatory Visit (INDEPENDENT_AMBULATORY_CARE_PROVIDER_SITE_OTHER): Payer: Medicare Other | Admitting: Pulmonary Disease

## 2012-07-20 ENCOUNTER — Encounter: Payer: Self-pay | Admitting: Pulmonary Disease

## 2012-07-20 VITALS — BP 126/80 | HR 73 | Temp 98.7°F | Ht 71.5 in | Wt 294.6 lb

## 2012-07-20 DIAGNOSIS — G4733 Obstructive sleep apnea (adult) (pediatric): Secondary | ICD-10-CM

## 2012-07-20 DIAGNOSIS — G2581 Restless legs syndrome: Secondary | ICD-10-CM

## 2012-07-20 DIAGNOSIS — G4700 Insomnia, unspecified: Secondary | ICD-10-CM

## 2012-07-20 MED ORDER — CLONAZEPAM 1 MG PO TABS: 1.0000 mg | ORAL_TABLET | Freq: Every day | ORAL | Status: DC

## 2012-07-20 NOTE — Assessment & Plan Note (Signed)
Stable on requip.  

## 2012-07-20 NOTE — Assessment & Plan Note (Signed)
He reports compliance with therapy and benefit from CPAP.  Will get copy of his download.

## 2012-07-20 NOTE — Assessment & Plan Note (Signed)
He feels that alprazolam helps him fall asleep, but not stay asleep.  Will change this to clonazepam 1 mg and adjust up as needed.  He is to stop using alprazolam.  Will discontinue trazodone since this is ineffective.

## 2012-07-20 NOTE — Progress Notes (Signed)
Chief Complaint  Patient presents with  . Follow-up    for OSA. Wear CPAP nightly x 7-8 hours. Denies problems with mask or pressure. Pt would like alternatives to sleep medications--Alprazolam 2mg  works at times and Trazadone is not working     History of Present Illness: [name] [name] is a 71 y.o. male with severe OSA, insomnia, and RLS.  He is doing well with CPAP.  He has noticed the water from his humidifier is running out before he wakes up.  He stopped using trazodone.  This was not helping anymore.  He has needed to use 4 mg alprazolam at night to help keep him asleep.  The 2 mg dose helps him fall asleep, but doesn't last.  He continues with requip, and this is working well.  Tests: PSG 11/03/05>>AHI 93.1, SpO2 low 72% Echo 01/30/08>>nl LV systolic fx, mild LA dilation  Past Medical History  Diagnosis Date  . Hypertension   . Hyperlipidemia   . Rhinitis   . Peripheral edema   . Osteoarthritis   . Obesity     morbid  . CAD (coronary artery disease)     s/p angioplasty  . ED (erectile dysfunction)   . OSA (obstructive sleep apnea)     severe, on CPAP 12  . Restless leg syndrome   . Insomnia   . Anxiety     Past Surgical History  Procedure Date  . Total knee arthroplasty     left  . Hemorrhoid surgery   . Back surgery 10/2009    Current Outpatient Prescriptions on File Prior to Visit  Medication Sig Dispense Refill  . AFLURIA PRESERVATIVE FREE injection FLU SHOT      . aspirin EC 81 MG tablet Take 1 tablet (81 mg total) by mouth daily.  90 tablet  3  . celecoxib (CELEBREX) 200 MG capsule Take 200 mg by mouth 2 (two) times daily.        Marland Kitchen gabapentin (NEURONTIN) 300 MG capsule TWO TABS TWICE A DAY      . HYDROcodone-acetaminophen (LORTAB) 10-500 MG per tablet As needed      . methocarbamol (ROBAXIN) 500 MG tablet As needed      . rOPINIRole (REQUIP) 1 MG tablet TAKE 2 TABLETS BY MOUTH AT BEDTIME  60 tablet  1  . rOPINIRole (REQUIP) 1 MG tablet TAKE 2  TABLETS BY MOUTH AT BEDTIME  60 tablet  0  . tiZANidine (ZANAFLEX) 4 MG tablet 2 pills at night      . traZODone (DESYREL) 100 MG tablet As needed to help sleep      . traZODone (DESYREL) 100 MG tablet TAKE 1 TABLET BY MOUTH AT BEDTIME  30 tablet  3  . VOLTAREN 1 % GEL As needed for knee or back pain        No Known Allergies   Physical Exam: Filed Vitals:   07/20/12 1552  BP: 126/80  Pulse: 73  Temp: 98.7 F (37.1 C)  TempSrc: Oral  Height: 5' 11.5" (1.816 m)  Weight: 294 lb 9.6 oz (133.63 kg)  SpO2: 95%  ,  Current Encounter SPO2  07/20/12 1552 95%  01/17/12 1500 97%  07/29/11 1509 96%    Wt Readings from Last 3 Encounters:  07/20/12 294 lb 9.6 oz (133.63 kg)  05/24/12 294 lb (133.358 kg)  01/17/12 300 lb 6.4 oz (136.261 kg)    Body mass index is 40.52 kg/(m^2).   General - No distress ENT - No sinus tenderness,  no oral exudate, no LAN Cardiac - s1s2 regular, no murmur Chest - No wheeze/rales/dullness Back - No focal tenderness Abd - Soft, non-tender Ext - No edema Neuro - Normal strength Skin - No rashes Psych - Normal mood, and behavior   Assessment/Plan:  [name] Helling, MD [facility] Pager:  [phone] 07/20/2012, 4:06 PM

## 2012-07-20 NOTE — Patient Instructions (Signed)
Stop Trazodone Stop Alprazolam (Xanax) Clonezapam 1 mg nightly at bedtime Will get copy of CPAP report Follow up in 6 months

## 2012-07-25 ENCOUNTER — Other Ambulatory Visit: Payer: Self-pay | Admitting: Physician Assistant

## 2012-07-28 ENCOUNTER — Encounter (HOSPITAL_COMMUNITY)
Admission: RE | Admit: 2012-07-28 | Discharge: 2012-07-28 | Disposition: A | Discharge status: Home / Self Care | Payer: Medicare Other | Source: Ambulatory Visit | Attending: Orthopedic Surgery | Admitting: Orthopedic Surgery

## 2012-07-28 ENCOUNTER — Encounter (HOSPITAL_COMMUNITY)
Admission: RE | Admit: 2012-07-28 | Discharge: 2012-07-28 | Disposition: A | Discharge status: Home / Self Care | Payer: Medicare Other | Source: Ambulatory Visit | Attending: Anesthesiology | Admitting: Anesthesiology

## 2012-07-28 ENCOUNTER — Encounter (HOSPITAL_COMMUNITY): Payer: Self-pay | Admitting: Respiratory Therapy

## 2012-07-28 ENCOUNTER — Encounter (HOSPITAL_COMMUNITY): Payer: Self-pay

## 2012-07-28 LAB — CBC WITH DIFFERENTIAL/PLATELET
Basophils Relative: 0 % (ref 0–1)
Eosinophils Absolute: 0.1 10*3/uL (ref 0.0–0.7)
MCH: 30.6 pg (ref 26.0–34.0)
MCHC: 33.5 g/dL (ref 30.0–36.0)
Neutrophils Relative %: 67 % (ref 43–77)
Platelets: 202 10*3/uL (ref 150–400)
RBC: 5.04 MIL/uL (ref 4.22–5.81)

## 2012-07-28 LAB — URINALYSIS, ROUTINE W REFLEX MICROSCOPIC
Nitrite: NEGATIVE
Specific Gravity, Urine: 1.032 — ABNORMAL HIGH (ref 1.005–1.030)
Urobilinogen, UA: 1 mg/dL (ref 0.0–1.0)

## 2012-07-28 LAB — COMPREHENSIVE METABOLIC PANEL
ALT: 10 U/L (ref 0–53)
AST: 13 U/L (ref 0–37)
Albumin: 3.7 g/dL (ref 3.5–5.2)
Alkaline Phosphatase: 88 U/L (ref 39–117)
Potassium: 4.3 mEq/L (ref 3.5–5.1)
Sodium: 133 mEq/L — ABNORMAL LOW (ref 135–145)
Total Protein: 7.3 g/dL (ref 6.0–8.3)

## 2012-07-28 LAB — TYPE AND SCREEN: Antibody Screen: NEGATIVE

## 2012-07-28 LAB — URINE MICROSCOPIC-ADD ON

## 2012-07-28 LAB — APTT: aPTT: 36 seconds (ref 24–37)

## 2012-07-28 LAB — SURGICAL PCR SCREEN
MRSA, PCR: NEGATIVE
Staphylococcus aureus: NEGATIVE

## 2012-07-28 NOTE — Pre-Procedure Instructions (Signed)
20 [name] [name]  07/28/2012   Your procedure is scheduled on:  August 04, 2012  fRIDAY  Report to [facility] at 0800 AM.  Call this number if you have problems the morning of surgery: [phone]   Remember:   Do not eat food   DO NOT DRINK LIQUIDS:After Midnight.    Take these medicines the morning of surgery with A SIP OF WATER: NEURONTIN  LORTAB     Do not wear jewelry, make-up or nail polish.  Do not wear lotions, powders, or perfumes. You may wear deodorant.  Do not shave 48 hours prior to surgery. Men may shave face and neck.  Do not bring valuables to the hospital.  Contacts, dentures or bridgework may not be worn into surgery.  Leave suitcase in the car. After surgery it may be brought to your room.  For patients admitted to the hospital, checkout time is 11:00 AM the day of discharge.   Patients discharged the day of surgery will not be allowed to drive home.  Name and phone number of your driver:   Special Instructions: Shower using CHG 2 nights before surgery and the night before surgery.  If you shower the day of surgery use CHG.  Use special wash - you have one bottle of CHG for all showers.  You should use approximately 1/3 of the bottle for each shower.   Please read over the following fact sheets that you were given: Pain Booklet, Coughing and Deep Breathing, Blood Transfusion Information, Lab Information, MRSA Information and Surgical Site Infection Prevention

## 2012-08-03 MED ORDER — ACETAMINOPHEN 10 MG/ML IV SOLN
1000.0000 mg | Freq: Four times a day (QID) | INTRAVENOUS | Status: DC
  Administered 2012-08-04: 1000 mg via INTRAVENOUS
  Filled 2012-08-03 (×4): qty 100

## 2012-08-03 MED ORDER — CHLORHEXIDINE GLUCONATE 4 % EX LIQD: 60.0000 mL | Freq: Once | CUTANEOUS | Status: DC

## 2012-08-03 MED ORDER — SODIUM CHLORIDE 0.9 % IV SOLN: INTRAVENOUS | Status: DC

## 2012-08-03 MED ORDER — CEFAZOLIN SODIUM-DEXTROSE 2-3 GM-% IV SOLR
2.0000 g | INTRAVENOUS | Status: DC
  Filled 2012-08-03: qty 50

## 2012-08-03 NOTE — H&P (Signed)
TOTAL KNEE ADMISSION H&P  Patient is being admitted for right total knee arthroplasty.  Subjective:  Chief Complaint:right knee pain.  HPI: [name] [name], 71 y.o. male, has a history of pain and functional disability in the right knee due to arthritis and has failed non-surgical conservative treatments for greater than 12 weeks to includeNSAID's and/or analgesics, corticosteriod injections, viscosupplementation injections and activity modification.  Onset of symptoms was gradual, starting >10 years ago with gradually worsening course since that time. The patient noted no past surgery on the right knee(s).  Patient currently rates pain in the right knee(s) as moderate to severe with activity. Patient has night pain, worsening of pain with activity and weight bearing, pain that interferes with activities of daily living, crepitus and joint swelling.  Patient has evidence of periarticular osteophytes, joint space narrowing and valgus deformity by imaging studies.  There is no active infection.  Patient Active Problem List   Diagnosis Date Noted  . CAD, NATIVE VESSEL 10/07/2009  . RESTLESS LEG SYNDROME 04/24/2009  . INSOMNIA 04/24/2009  . TOBACCO ABUSE 02/29/2008  . SLEEP APNEA, OBSTRUCTIVE 02/29/2008   Past Medical History  Diagnosis Date  . Hypertension   . Hyperlipidemia   . Rhinitis   . Peripheral edema   . Osteoarthritis   . Obesity     morbid  . CAD (coronary artery disease)     s/p angioplasty  . ED (erectile dysfunction)   . OSA (obstructive sleep apnea)     severe, on CPAP 12  . Restless leg syndrome   . Insomnia   . Anxiety     Past Surgical History  Procedure Date  . Total knee arthroplasty     left  . Hemorrhoid surgery   . Back surgery 10/2009  . Eye surgery     lazer bil  . Coronary angioplasty      (Not in a hospital admission) No Known Allergies  History  Substance Use Topics  . Smoking status: Smoker, Current Status Unknown -- 1.0 packs/day for 55  years    Types: Cigarettes  . Smokeless tobacco: Never Used     Comment: <1 ppd  . Alcohol Use: No    Family History  Problem Relation Age of Onset  . Coronary artery disease Neg Hx      Review of Systems  Constitutional: Negative for fever, chills, weight loss, malaise/fatigue and diaphoresis.  HENT: Negative for hearing loss, ear pain and sore throat.   Eyes: Negative for photophobia, pain, discharge and redness.  Respiratory: Negative for hemoptysis and shortness of breath.        Sleep apnea  Cardiovascular: Negative for chest pain, palpitations and orthopnea.  Gastrointestinal: Negative for heartburn, nausea, vomiting, abdominal pain, diarrhea, constipation, blood in stool and melena.  Genitourinary: Negative for dysuria, urgency, frequency, hematuria and flank pain.       Night urinating  Musculoskeletal: Positive for joint pain. Negative for falls.  Skin: Negative for itching and rash.  Neurological: Negative for dizziness, tingling, tremors, sensory change, seizures, loss of consciousness and headaches.  Endo/Heme/Allergies: Negative for polydipsia. Does not bruise/bleed easily.  Psychiatric/Behavioral: Negative for depression and substance abuse.    Objective:  Physical Exam  Constitutional: He is oriented to person, place, and time. He appears well-nourished.       obese  HENT:  Head: Normocephalic and atraumatic.  Nose: Nose normal.  Eyes: Conjunctivae normal and EOM are normal. Pupils are equal, round, and reactive to light.  Neck: Normal range   of motion. Neck supple.  Cardiovascular: Normal rate, regular rhythm, normal heart sounds and intact distal pulses.   No murmur heard. Respiratory: Effort normal. No respiratory distress. He has wheezes. He exhibits no tenderness.  GI: Soft. Bowel sounds are normal. He exhibits no distension. There is no tenderness.  Musculoskeletal:       Right knee: tenderness found. Medial joint line and lateral joint line tenderness  noted.       ROM 0-120, valgus inclination, positive patellofemoral crepitus, stable ligamentous testing  Neurological: He is alert and oriented to person, place, and time. No cranial nerve deficit. Coordination normal.  Skin: Skin is warm and dry. No erythema.  Psychiatric: He has a normal mood and affect.    Vital signs in last 24 hours: Temp 98.5, HR 74, RR 20, BP 126/71  Labs:   Estimated Body mass index is 40.52 kg/(m^2) as calculated from the following:   Height as of 07/20/12: 5' 11.5"(1.816 m).   Weight as of 07/20/12: 294 lb 9.6 oz(133.63 kg).   Imaging Review Plain radiographs demonstrate severe degenerative joint disease of the right knee(s). The overall alignment issignificant valgus. The bone quality appears to be satisfactory for age and reported activity level.  Assessment/Plan:  End stage arthritis, right knee   The patient history, physical examination, clinical judgment of the provider and imaging studies are consistent with end stage degenerative joint disease of the right knee(s) and total knee arthroplasty is deemed medically necessary. The treatment options including medical management, injection therapy arthroscopy and arthroplasty were discussed at length. The risks and benefits of total knee arthroplasty were presented and reviewed. The risks due to aseptic loosening, infection, stiffness, patella tracking problems, thromboembolic complications and other imponderables were discussed. The patient acknowledged the explanation, agreed to proceed with the plan and consent was signed. Patient is being admitted for inpatient treatment for surgery, pain control, PT, OT, prophylactic antibiotics, VTE prophylaxis, progressive ambulation and ADL's and discharge planning. The patient is planning to be discharged home with home health services. Surgery scheduled for 08/04/2012. He has obtained preoperative clearance medical PCP Dr. Hawkins, cardiac Dr. Christopher McElhaney at  [place].  May benefit from longer anticoagulation due to smoking status.   

## 2012-08-04 ENCOUNTER — Encounter (HOSPITAL_COMMUNITY): Payer: Self-pay | Admitting: Certified Registered"

## 2012-08-04 ENCOUNTER — Encounter (HOSPITAL_COMMUNITY): Payer: Self-pay | Admitting: General Practice

## 2012-08-04 ENCOUNTER — Encounter (HOSPITAL_COMMUNITY)
Admission: RE | Disposition: A | Discharge status: Home / Self Care | Payer: Self-pay | Source: Ambulatory Visit | Attending: Orthopedic Surgery

## 2012-08-04 ENCOUNTER — Inpatient Hospital Stay (HOSPITAL_COMMUNITY)
Admission: RE | Admit: 2012-08-04 | Discharge: 2012-08-06 | DRG: 470 | Disposition: A | Discharge status: Home / Self Care | Payer: Medicare Other | Source: Ambulatory Visit | Attending: Orthopedic Surgery | Admitting: Orthopedic Surgery

## 2012-08-04 ENCOUNTER — Encounter (HOSPITAL_COMMUNITY): Payer: Self-pay | Admitting: *Deleted

## 2012-08-04 ENCOUNTER — Inpatient Hospital Stay (HOSPITAL_COMMUNITY): Payer: Medicare Other | Admitting: Certified Registered"

## 2012-08-04 DIAGNOSIS — Z9861 Coronary angioplasty status: Secondary | ICD-10-CM | POA: Diagnosis present

## 2012-08-04 DIAGNOSIS — Z7901 Long term (current) use of anticoagulants: Secondary | ICD-10-CM

## 2012-08-04 DIAGNOSIS — I10 Essential (primary) hypertension: Secondary | ICD-10-CM | POA: Diagnosis present

## 2012-08-04 DIAGNOSIS — F419 Anxiety disorder, unspecified: Secondary | ICD-10-CM | POA: Diagnosis present

## 2012-08-04 DIAGNOSIS — E871 Hypo-osmolality and hyponatremia: Secondary | ICD-10-CM | POA: Diagnosis not present

## 2012-08-04 DIAGNOSIS — G2581 Restless legs syndrome: Secondary | ICD-10-CM | POA: Diagnosis present

## 2012-08-04 DIAGNOSIS — Z96659 Presence of unspecified artificial knee joint: Secondary | ICD-10-CM

## 2012-08-04 DIAGNOSIS — G4700 Insomnia, unspecified: Secondary | ICD-10-CM | POA: Diagnosis present

## 2012-08-04 DIAGNOSIS — Z79899 Other long term (current) drug therapy: Secondary | ICD-10-CM

## 2012-08-04 DIAGNOSIS — Z01812 Encounter for preprocedural laboratory examination: Secondary | ICD-10-CM

## 2012-08-04 DIAGNOSIS — F17200 Nicotine dependence, unspecified, uncomplicated: Secondary | ICD-10-CM | POA: Diagnosis present

## 2012-08-04 DIAGNOSIS — G4733 Obstructive sleep apnea (adult) (pediatric): Secondary | ICD-10-CM | POA: Diagnosis present

## 2012-08-04 DIAGNOSIS — M1990 Unspecified osteoarthritis, unspecified site: Secondary | ICD-10-CM | POA: Diagnosis present

## 2012-08-04 DIAGNOSIS — M1710 Unilateral primary osteoarthritis, unspecified knee: Principal | ICD-10-CM | POA: Diagnosis present

## 2012-08-04 DIAGNOSIS — Z6838 Body mass index (BMI) 38.0-38.9, adult: Secondary | ICD-10-CM

## 2012-08-04 DIAGNOSIS — I2510 Atherosclerotic heart disease of native coronary artery without angina pectoris: Secondary | ICD-10-CM | POA: Diagnosis present

## 2012-08-04 DIAGNOSIS — F411 Generalized anxiety disorder: Secondary | ICD-10-CM | POA: Diagnosis present

## 2012-08-04 DIAGNOSIS — E785 Hyperlipidemia, unspecified: Secondary | ICD-10-CM | POA: Diagnosis present

## 2012-08-04 HISTORY — PX: TOTAL KNEE ARTHROPLASTY: SHX125

## 2012-08-04 SURGERY — ARTHROPLASTY, KNEE, TOTAL
Anesthesia: General | Site: Knee | Laterality: Right | Wound class: Clean

## 2012-08-04 MED ORDER — GABAPENTIN 300 MG PO CAPS
300.0000 mg | ORAL_CAPSULE | Freq: Four times a day (QID) | ORAL | Status: DC
  Administered 2012-08-04 – 2012-08-06 (×7): 300 mg via ORAL
  Filled 2012-08-04 (×10): qty 1

## 2012-08-04 MED ORDER — FLEET ENEMA 7-19 GM/118ML RE ENEM: 1.0000 | ENEMA | Freq: Once | RECTAL | Status: AC | PRN

## 2012-08-04 MED ORDER — HYDROMORPHONE HCL PF 1 MG/ML IJ SOLN
INTRAMUSCULAR | Status: AC
  Filled 2012-08-04: qty 1

## 2012-08-04 MED ORDER — ACETAMINOPHEN 325 MG PO TABS: 650.0000 mg | ORAL_TABLET | Freq: Four times a day (QID) | ORAL | Status: DC | PRN

## 2012-08-04 MED ORDER — OXYCODONE HCL 5 MG/5ML PO SOLN: 5.0000 mg | Freq: Once | ORAL | Status: DC | PRN

## 2012-08-04 MED ORDER — DEXTROSE 5 % IV SOLN
500.0000 mg | Freq: Four times a day (QID) | INTRAVENOUS | Status: DC | PRN
  Administered 2012-08-04: 500 mg via INTRAVENOUS
  Filled 2012-08-04: qty 5

## 2012-08-04 MED ORDER — SODIUM CHLORIDE 0.9 % IV SOLN
INTRAVENOUS | Status: DC
  Administered 2012-08-04: 14:00:00 via INTRAVENOUS

## 2012-08-04 MED ORDER — FENTANYL CITRATE 0.05 MG/ML IJ SOLN
INTRAMUSCULAR | Status: AC
  Filled 2012-08-04: qty 2

## 2012-08-04 MED ORDER — ONDANSETRON HCL 4 MG PO TABS: 4.0000 mg | ORAL_TABLET | Freq: Four times a day (QID) | ORAL | Status: DC | PRN

## 2012-08-04 MED ORDER — ONDANSETRON HCL 4 MG/2ML IJ SOLN: 4.0000 mg | Freq: Once | INTRAMUSCULAR | Status: DC | PRN

## 2012-08-04 MED ORDER — LIDOCAINE HCL (CARDIAC) 20 MG/ML IV SOLN
INTRAVENOUS | Status: DC | PRN
  Administered 2012-08-04: 100 mg via INTRAVENOUS

## 2012-08-04 MED ORDER — ONDANSETRON HCL 4 MG/2ML IJ SOLN
INTRAMUSCULAR | Status: DC | PRN
  Administered 2012-08-04: 4 mg via INTRAVENOUS

## 2012-08-04 MED ORDER — HYDROMORPHONE HCL PF 1 MG/ML IJ SOLN
0.2500 mg | INTRAMUSCULAR | Status: DC | PRN
  Administered 2012-08-04 (×2): 0.5 mg via INTRAVENOUS

## 2012-08-04 MED ORDER — OXYCODONE HCL 5 MG PO TABS
5.0000 mg | ORAL_TABLET | ORAL | Status: DC | PRN
  Administered 2012-08-04 – 2012-08-06 (×10): 10 mg via ORAL
  Filled 2012-08-04 (×10): qty 2

## 2012-08-04 MED ORDER — ONDANSETRON HCL 4 MG/2ML IJ SOLN: 4.0000 mg | Freq: Four times a day (QID) | INTRAMUSCULAR | Status: DC | PRN

## 2012-08-04 MED ORDER — DOCUSATE SODIUM 100 MG PO CAPS
100.0000 mg | ORAL_CAPSULE | Freq: Two times a day (BID) | ORAL | Status: DC
  Administered 2012-08-04 – 2012-08-06 (×4): 100 mg via ORAL
  Filled 2012-08-04 (×6): qty 1

## 2012-08-04 MED ORDER — FENTANYL CITRATE 0.05 MG/ML IJ SOLN
INTRAMUSCULAR | Status: DC | PRN
  Administered 2012-08-04 (×2): 50 ug via INTRAVENOUS
  Administered 2012-08-04: 100 ug via INTRAVENOUS
  Administered 2012-08-04: 50 ug via INTRAVENOUS
  Administered 2012-08-04: 100 ug via INTRAVENOUS
  Administered 2012-08-04 (×3): 50 ug via INTRAVENOUS

## 2012-08-04 MED ORDER — DEXTROSE 5 % IV SOLN
3.0000 g | INTRAVENOUS | Status: AC
  Administered 2012-08-04: 3 g via INTRAVENOUS
  Filled 2012-08-04: qty 3000

## 2012-08-04 MED ORDER — OXYCODONE HCL 5 MG PO TABS: 5.0000 mg | ORAL_TABLET | Freq: Once | ORAL | Status: DC | PRN

## 2012-08-04 MED ORDER — ROPINIROLE HCL 1 MG PO TABS
2.0000 mg | ORAL_TABLET | Freq: Every day | ORAL | Status: DC
  Administered 2012-08-04 – 2012-08-05 (×2): 2 mg via ORAL
  Filled 2012-08-04 (×3): qty 2

## 2012-08-04 MED ORDER — ENOXAPARIN SODIUM 30 MG/0.3ML ~~LOC~~ SOLN
30.0000 mg | Freq: Two times a day (BID) | SUBCUTANEOUS | Status: DC
  Administered 2012-08-05 – 2012-08-06 (×3): 30 mg via SUBCUTANEOUS
  Filled 2012-08-04 (×5): qty 0.3

## 2012-08-04 MED ORDER — ALPRAZOLAM 0.5 MG PO TABS
2.0000 mg | ORAL_TABLET | Freq: Every day | ORAL | Status: DC
  Administered 2012-08-04 – 2012-08-05 (×2): 2 mg via ORAL
  Filled 2012-08-04 (×2): qty 4

## 2012-08-04 MED ORDER — MIDAZOLAM HCL 2 MG/2ML IJ SOLN
INTRAMUSCULAR | Status: AC
  Filled 2012-08-04: qty 2

## 2012-08-04 MED ORDER — ACETAMINOPHEN 10 MG/ML IV SOLN
1000.0000 mg | Freq: Four times a day (QID) | INTRAVENOUS | Status: AC
  Administered 2012-08-04 – 2012-08-05 (×3): 1000 mg via INTRAVENOUS
  Filled 2012-08-04 (×4): qty 100

## 2012-08-04 MED ORDER — ENOXAPARIN SODIUM 30 MG/0.3ML ~~LOC~~ SOLN: 30.0000 mg | Freq: Two times a day (BID) | SUBCUTANEOUS | Status: DC

## 2012-08-04 MED ORDER — BUPIVACAINE-EPINEPHRINE PF 0.5-1:200000 % IJ SOLN
INTRAMUSCULAR | Status: DC | PRN
  Administered 2012-08-04: 30 mL

## 2012-08-04 MED ORDER — MENTHOL 3 MG MT LOZG
1.0000 | LOZENGE | OROMUCOSAL | Status: DC | PRN
  Filled 2012-08-04: qty 9

## 2012-08-04 MED ORDER — SENNOSIDES-DOCUSATE SODIUM 8.6-50 MG PO TABS: 1.0000 | ORAL_TABLET | Freq: Every evening | ORAL | Status: DC | PRN

## 2012-08-04 MED ORDER — BISACODYL 10 MG RE SUPP: 10.0000 mg | Freq: Every day | RECTAL | Status: DC | PRN

## 2012-08-04 MED ORDER — PHENOL 1.4 % MT LIQD: 1.0000 | OROMUCOSAL | Status: DC | PRN

## 2012-08-04 MED ORDER — METOCLOPRAMIDE HCL 5 MG/ML IJ SOLN: 5.0000 mg | Freq: Three times a day (TID) | INTRAMUSCULAR | Status: DC | PRN

## 2012-08-04 MED ORDER — OXYCODONE-ACETAMINOPHEN 5-325 MG PO TABS: ORAL_TABLET | ORAL | Status: DC

## 2012-08-04 MED ORDER — METHOCARBAMOL 500 MG PO TABS: 500.0000 mg | ORAL_TABLET | Freq: Four times a day (QID) | ORAL | Status: DC

## 2012-08-04 MED ORDER — METHOCARBAMOL 500 MG PO TABS
500.0000 mg | ORAL_TABLET | Freq: Four times a day (QID) | ORAL | Status: DC | PRN
Start: 2012-08-04 — End: 2012-08-06
  Administered 2012-08-04 – 2012-08-06 (×3): 500 mg via ORAL
  Filled 2012-08-04 (×4): qty 1

## 2012-08-04 MED ORDER — MEPERIDINE HCL 25 MG/ML IJ SOLN: 6.2500 mg | INTRAMUSCULAR | Status: DC | PRN

## 2012-08-04 MED ORDER — CELECOXIB 200 MG PO CAPS
200.0000 mg | ORAL_CAPSULE | Freq: Two times a day (BID) | ORAL | Status: DC
  Administered 2012-08-04 – 2012-08-06 (×4): 200 mg via ORAL
  Filled 2012-08-04 (×5): qty 1

## 2012-08-04 MED ORDER — HYDROMORPHONE HCL PF 1 MG/ML IJ SOLN
1.0000 mg | INTRAMUSCULAR | Status: DC | PRN
  Administered 2012-08-04: 1 mg via INTRAVENOUS
  Filled 2012-08-04: qty 1

## 2012-08-04 MED ORDER — METOCLOPRAMIDE HCL 10 MG PO TABS: 5.0000 mg | ORAL_TABLET | Freq: Three times a day (TID) | ORAL | Status: DC | PRN

## 2012-08-04 MED ORDER — CEFAZOLIN SODIUM-DEXTROSE 2-3 GM-% IV SOLR
2.0000 g | Freq: Four times a day (QID) | INTRAVENOUS | Status: AC
  Administered 2012-08-04 (×2): 2 g via INTRAVENOUS
  Filled 2012-08-04 (×2): qty 50

## 2012-08-04 MED ORDER — SODIUM CHLORIDE 0.9 % IR SOLN
Status: DC | PRN
  Administered 2012-08-04: 3000 mL

## 2012-08-04 MED ORDER — HYDROMORPHONE HCL PF 1 MG/ML IJ SOLN: 0.2500 mg | INTRAMUSCULAR | Status: DC | PRN

## 2012-08-04 MED ORDER — CLONAZEPAM 1 MG PO TABS
1.0000 mg | ORAL_TABLET | Freq: Every evening | ORAL | Status: DC | PRN
  Administered 2012-08-04: 1 mg via ORAL
  Filled 2012-08-04: qty 1

## 2012-08-04 MED ORDER — PROPOFOL 10 MG/ML IV BOLUS
INTRAVENOUS | Status: DC | PRN
  Administered 2012-08-04: 200 mg via INTRAVENOUS

## 2012-08-04 MED ORDER — LACTATED RINGERS IV SOLN
INTRAVENOUS | Status: DC | PRN
  Administered 2012-08-04 (×2): via INTRAVENOUS

## 2012-08-04 MED ORDER — ACETAMINOPHEN 650 MG RE SUPP: 650.0000 mg | Freq: Four times a day (QID) | RECTAL | Status: DC | PRN

## 2012-08-04 MED ORDER — MIDAZOLAM HCL 5 MG/5ML IJ SOLN
INTRAMUSCULAR | Status: DC | PRN
  Administered 2012-08-04: 2 mg via INTRAVENOUS

## 2012-08-04 SURGICAL SUPPLY — 69 items
BANDAGE ELASTIC 4 VELCRO ST LF (GAUZE/BANDAGES/DRESSINGS) ×2 IMPLANT
BANDAGE ELASTIC 6 VELCRO ST LF (GAUZE/BANDAGES/DRESSINGS) ×2 IMPLANT
BANDAGE ESMARK 6X9 LF (GAUZE/BANDAGES/DRESSINGS) ×1 IMPLANT
BLADE SAGITTAL 25.0X1.19X90 (BLADE) ×2 IMPLANT
BLADE SAW SAG 90X13X1.27 (BLADE) ×2 IMPLANT
BLADE SURG 10 STRL SS (BLADE) ×2 IMPLANT
BNDG CMPR 9X6 STRL LF SNTH (GAUZE/BANDAGES/DRESSINGS) ×1
BNDG ESMARK 6X9 LF (GAUZE/BANDAGES/DRESSINGS) ×2
BOWL SMART MIX CTS (DISPOSABLE) ×2 IMPLANT
CEMENT HV SMART SET (Cement) ×2 IMPLANT
CLOTH BEACON ORANGE TIMEOUT ST (SAFETY) ×2 IMPLANT
COVER BACK TABLE 24X17X13 BIG (DRAPES) IMPLANT
COVER SURGICAL LIGHT HANDLE (MISCELLANEOUS) ×2 IMPLANT
CUFF TOURNIQUET SINGLE 34IN LL (TOURNIQUET CUFF) ×2 IMPLANT
CUFF TOURNIQUET SINGLE 44IN (TOURNIQUET CUFF) IMPLANT
DRAPE INCISE IOBAN 66X45 STRL (DRAPES) ×1 IMPLANT
DRAPE ORTHO SPLIT 77X108 STRL (DRAPES) ×4
DRAPE SURG ORHT 6 SPLT 77X108 (DRAPES) ×2 IMPLANT
DRAPE U-SHAPE 47X51 STRL (DRAPES) ×2 IMPLANT
DRSG ADAPTIC 3X8 NADH LF (GAUZE/BANDAGES/DRESSINGS) ×2 IMPLANT
DRSG PAD ABDOMINAL 8X10 ST (GAUZE/BANDAGES/DRESSINGS) ×3 IMPLANT
DURAPREP 26ML APPLICATOR (WOUND CARE) ×2 IMPLANT
ELECT REM PT RETURN 9FT ADLT (ELECTROSURGICAL) ×2
ELECTRODE REM PT RTRN 9FT ADLT (ELECTROSURGICAL) ×1 IMPLANT
EVACUATOR 1/8 PVC DRAIN (DRAIN) ×2 IMPLANT
FACESHIELD LNG OPTICON STERILE (SAFETY) ×3 IMPLANT
FLOSEAL 10ML (HEMOSTASIS) IMPLANT
GLOVE BIOGEL PI IND STRL 7.0 (GLOVE) IMPLANT
GLOVE BIOGEL PI IND STRL 7.5 (GLOVE) IMPLANT
GLOVE BIOGEL PI IND STRL 8 (GLOVE) ×4 IMPLANT
GLOVE BIOGEL PI INDICATOR 7.0 (GLOVE) ×6
GLOVE BIOGEL PI INDICATOR 7.5 (GLOVE) ×1
GLOVE BIOGEL PI INDICATOR 8 (GLOVE) ×3
GLOVE ORTHO TXT STRL SZ7.5 (GLOVE) ×5 IMPLANT
GLOVE SURG ORTHO 8.0 STRL STRW (GLOVE) ×5 IMPLANT
GLOVE SURG SS PI 8.0 STRL IVOR (GLOVE) ×1 IMPLANT
GOWN PREVENTION PLUS XLARGE (GOWN DISPOSABLE) ×2 IMPLANT
GOWN PREVENTION PLUS XXLARGE (GOWN DISPOSABLE) ×2 IMPLANT
GOWN SRG XL XLNG 56XLVL 4 (GOWN DISPOSABLE) IMPLANT
GOWN STRL NON-REIN LRG LVL3 (GOWN DISPOSABLE) ×3 IMPLANT
GOWN STRL NON-REIN XL XLG LVL4 (GOWN DISPOSABLE) ×4
HANDPIECE INTERPULSE COAX TIP (DISPOSABLE) ×2
HOOD PEEL AWAY FACE SHEILD DIS (HOOD) ×3 IMPLANT
IMMOBILIZER KNEE 22 UNIV (SOFTGOODS) ×1 IMPLANT
KIT BASIN OR (CUSTOM PROCEDURE TRAY) ×2 IMPLANT
KIT ROOM TURNOVER OR (KITS) ×2 IMPLANT
MANIFOLD NEPTUNE II (INSTRUMENTS) ×2 IMPLANT
NEEDLE 22X1 1/2 (OR ONLY) (NEEDLE) IMPLANT
NS IRRIG 1000ML POUR BTL (IV SOLUTION) ×1 IMPLANT
PACK TOTAL JOINT (CUSTOM PROCEDURE TRAY) ×2 IMPLANT
PAD ARMBOARD 7.5X6 YLW CONV (MISCELLANEOUS) ×3 IMPLANT
PAD CAST 4YDX4 CTTN HI CHSV (CAST SUPPLIES) ×1 IMPLANT
PADDING CAST COTTON 4X4 STRL (CAST SUPPLIES) ×2
PADDING CAST COTTON 6X4 STRL (CAST SUPPLIES) ×2 IMPLANT
SET HNDPC FAN SPRY TIP SCT (DISPOSABLE) ×1 IMPLANT
SPONGE GAUZE 4X4 12PLY (GAUZE/BANDAGES/DRESSINGS) ×2 IMPLANT
SPONGE LAP 18X18 X RAY DECT (DISPOSABLE) ×1 IMPLANT
STAPLER VISISTAT 35W (STAPLE) ×2 IMPLANT
SUCTION FRAZIER TIP 10 FR DISP (SUCTIONS) ×2 IMPLANT
SUT ETHIBOND NAB CT1 #1 30IN (SUTURE) ×6 IMPLANT
SUT VIC AB 0 CT1 27 (SUTURE) ×4
SUT VIC AB 0 CT1 27XBRD ANBCTR (SUTURE) ×1 IMPLANT
SUT VIC AB 2-0 CT1 27 (SUTURE) ×4
SUT VIC AB 2-0 CT1 TAPERPNT 27 (SUTURE) ×2 IMPLANT
SYR CONTROL 10ML LL (SYRINGE) IMPLANT
TOWEL OR 17X24 6PK STRL BLUE (TOWEL DISPOSABLE) ×2 IMPLANT
TOWEL OR 17X26 10 PK STRL BLUE (TOWEL DISPOSABLE) ×2 IMPLANT
TRAY FOLEY CATH 14FR (SET/KITS/TRAYS/PACK) ×2 IMPLANT
WATER STERILE IRR 1000ML POUR (IV SOLUTION) ×4 IMPLANT

## 2012-08-04 NOTE — Progress Notes (Signed)
Orthopedic Tech Progress Note Patient Details:  [name] [name] 09/28/1940 [id number]  CPM Right Knee CPM Right Knee: On Right Knee Flexion (Degrees): 60  Right Knee Extension (Degrees): 0  Additional Comments: trapeze bar   Cammer, Mickie Bail 08/04/2012, 4:50 PM

## 2012-08-04 NOTE — Interval H&P Note (Signed)
History and Physical Interval Note:  08/04/2012 9:55 AM  [name] [name]  has presented today for surgery, with the diagnosis of right knee osteoarthritis  The various methods of treatment have been discussed with the patient and family. After consideration of risks, benefits and other options for treatment, the patient has consented to  Procedure(s) (LRB) with comments: TOTAL KNEE ARTHROPLASTY (Right) - WITH PATELLA RESURFACING as a surgical intervention .  The patient's history has been reviewed, patient examined, no change in status, stable for surgery.  I have reviewed the patient's chart and labs.  Questions were answered to the patient's satisfaction.     Tavarus Poteete JR,W D

## 2012-08-04 NOTE — Transfer of Care (Signed)
Immediate Anesthesia Transfer of Care Note  Patient: [name] [name]  Procedure(s) Performed: Procedure(s) (LRB) with comments: TOTAL KNEE ARTHROPLASTY (Right) - WITH PATELLA RESURFACING  Patient Location: PACU  Anesthesia Type:General  Level of Consciousness: awake, alert  and oriented  Airway & Oxygen Therapy: Patient Spontanous Breathing and Patient connected to face mask oxygen  Post-op Assessment: Report given to PACU RN, Post -op Vital signs reviewed and stable and Patient moving all extremities  Post vital signs: Reviewed and stable  Complications: No apparent anesthesia complications

## 2012-08-04 NOTE — Preoperative (Signed)
Beta Blockers   Reason not to administer Beta Blockers:Not Applicable 

## 2012-08-04 NOTE — Anesthesia Postprocedure Evaluation (Signed)
Anesthesia Post Note  Patient: [name] [name]  Procedure(s) Performed: Procedure(s) (LRB): TOTAL KNEE ARTHROPLASTY (Right)  Anesthesia type: general  Patient location: PACU  Post pain: Pain level controlled  Post assessment: Patient's Cardiovascular Status Stable  Last Vitals:  Filed Vitals:   08/04/12 1445  BP: 113/58  Pulse: 89  Temp:   Resp: 14    Post vital signs: Reviewed and stable  Level of consciousness: sedated  Complications: No apparent anesthesia complications

## 2012-08-04 NOTE — Progress Notes (Signed)
[facility]  Patient Status: New  [facility] is providing the following services: PT  Referral from MD office, thank you  If patient discharges after hours, please call [phone].   Jodene Nam 08/04/2012, 5:06 PM

## 2012-08-04 NOTE — Anesthesia Preprocedure Evaluation (Signed)
Anesthesia Evaluation  Patient identified by MRN, date of birth, ID band Patient awake    Reviewed: Allergy & Precautions, H&P , NPO status , Patient's Chart, lab work & pertinent test results  Airway Mallampati: I TM Distance: >3 FB Neck ROM: Full    Dental   Pulmonary          Cardiovascular hypertension,     Neuro/Psych    GI/Hepatic   Endo/Other    Renal/GU      Musculoskeletal   Abdominal   Peds  Hematology   Anesthesia Other Findings   Reproductive/Obstetrics                           Anesthesia Physical Anesthesia Plan  ASA: III  Anesthesia Plan: General   Post-op Pain Management:    Induction: Intravenous  Airway Management Planned: LMA  Additional Equipment:   Intra-op Plan:   Post-operative Plan: Extubation in OR  Informed Consent: I have reviewed the patients History and Physical, chart, labs and discussed the procedure including the risks, benefits and alternatives for the proposed anesthesia with the patient or authorized representative who has indicated his/her understanding and acceptance.     Plan Discussed with: CRNA and Surgeon  Anesthesia Plan Comments:         Anesthesia Quick Evaluation

## 2012-08-04 NOTE — Brief Op Note (Signed)
08/04/2012  1:50 PM  PATIENT:  Wenda Overland  71 y.o. male  PRE-OPERATIVE DIAGNOSIS:  right knee osteoarthritis  POST-OPERATIVE DIAGNOSIS:  right knee osteoarthritis  PROCEDURE:  Procedure(s) (LRB) with comments: TOTAL KNEE ARTHROPLASTY (Right) - WITH PATELLA RESURFACING  SURGEON:  Surgeon(s) and Role:    * W D Carloyn Manner., MD - Primary  PHYSICIAN ASSISTANT:   ASSISTANTS: Margart Sickles, PA-C   ANESTHESIA:   general and femoral block  EBL:  Total I/O In: 1000 [I.V.:1000] Out: -   BLOOD ADMINISTERED:none  DRAINS: hemovac right knee self suction  LOCAL MEDICATIONS USED:  NONE  SPECIMEN:  No Specimen  DISPOSITION OF SPECIMEN:  N/A  COUNTS:  YES  TOURNIQUET:   Total Tourniquet Time Documented: Thigh (Right) - 76 minutes  DICTATION: .Other Dictation: Dictation Number   PLAN OF CARE: Admit to inpatient   PATIENT DISPOSITION:  PACU - hemodynamically stable.   Delay start of Pharmacological VTE agent (>24hrs) due to surgical blood loss or risk of bleeding: yes

## 2012-08-04 NOTE — H&P (View-Only) (Signed)
TOTAL KNEE ADMISSION H&P  Patient is being admitted for right total knee arthroplasty.  Subjective:  Chief Complaint:right knee pain.  HPI: [name] [name], 71 y.o. male, has a history of pain and functional disability in the right knee due to arthritis and has failed non-surgical conservative treatments for greater than 12 weeks to includeNSAID's and/or analgesics, corticosteriod injections, viscosupplementation injections and activity modification.  Onset of symptoms was gradual, starting >10 years ago with gradually worsening course since that time. The patient noted no past surgery on the right knee(s).  Patient currently rates pain in the right knee(s) as moderate to severe with activity. Patient has night pain, worsening of pain with activity and weight bearing, pain that interferes with activities of daily living, crepitus and joint swelling.  Patient has evidence of periarticular osteophytes, joint space narrowing and valgus deformity by imaging studies.  There is no active infection.  Patient Active Problem List   Diagnosis Date Noted  . CAD, NATIVE VESSEL 10/07/2009  . RESTLESS LEG SYNDROME 04/24/2009  . INSOMNIA 04/24/2009  . TOBACCO ABUSE 02/29/2008  . SLEEP APNEA, OBSTRUCTIVE 02/29/2008   Past Medical History  Diagnosis Date  . Hypertension   . Hyperlipidemia   . Rhinitis   . Peripheral edema   . Osteoarthritis   . Obesity     morbid  . CAD (coronary artery disease)     s/p angioplasty  . ED (erectile dysfunction)   . OSA (obstructive sleep apnea)     severe, on CPAP 12  . Restless leg syndrome   . Insomnia   . Anxiety     Past Surgical History  Procedure Date  . Total knee arthroplasty     left  . Hemorrhoid surgery   . Back surgery 10/2009  . Eye surgery     lazer bil  . Coronary angioplasty      (Not in a hospital admission) No Known Allergies  History  Substance Use Topics  . Smoking status: Smoker, Current Status Unknown -- 1.0 packs/day for 55  years    Types: Cigarettes  . Smokeless tobacco: Never Used     Comment: <1 ppd  . Alcohol Use: No    Family History  Problem Relation Age of Onset  . Coronary artery disease Neg Hx      Review of Systems  Constitutional: Negative for fever, chills, weight loss, malaise/fatigue and diaphoresis.  HENT: Negative for hearing loss, ear pain and sore throat.   Eyes: Negative for photophobia, pain, discharge and redness.  Respiratory: Negative for hemoptysis and shortness of breath.        Sleep apnea  Cardiovascular: Negative for chest pain, palpitations and orthopnea.  Gastrointestinal: Negative for heartburn, nausea, vomiting, abdominal pain, diarrhea, constipation, blood in stool and melena.  Genitourinary: Negative for dysuria, urgency, frequency, hematuria and flank pain.       Night urinating  Musculoskeletal: Positive for joint pain. Negative for falls.  Skin: Negative for itching and rash.  Neurological: Negative for dizziness, tingling, tremors, sensory change, seizures, loss of consciousness and headaches.  Endo/Heme/Allergies: Negative for polydipsia. Does not bruise/bleed easily.  Psychiatric/Behavioral: Negative for depression and substance abuse.    Objective:  Physical Exam  Constitutional: He is oriented to person, place, and time. He appears well-nourished.       obese  HENT:  Head: Normocephalic and atraumatic.  Nose: Nose normal.  Eyes: Conjunctivae normal and EOM are normal. Pupils are equal, round, and reactive to light.  Neck: Normal range  of motion. Neck supple.  Cardiovascular: Normal rate, regular rhythm, normal heart sounds and intact distal pulses.   No murmur heard. Respiratory: Effort normal. No respiratory distress. He has wheezes. He exhibits no tenderness.  GI: Soft. Bowel sounds are normal. He exhibits no distension. There is no tenderness.  Musculoskeletal:       Right knee: tenderness found. Medial joint line and lateral joint line tenderness  noted.       ROM 0-120, valgus inclination, positive patellofemoral crepitus, stable ligamentous testing  Neurological: He is alert and oriented to person, place, and time. No cranial nerve deficit. Coordination normal.  Skin: Skin is warm and dry. No erythema.  Psychiatric: He has a normal mood and affect.    Vital signs in last 24 hours: Temp 98.5, HR 74, RR 20, BP 126/71  Labs:   Estimated Body mass index is 40.52 kg/(m^2) as calculated from the following:   Height as of 07/20/12: 5' 11.5"(1.816 m).   Weight as of 07/20/12: 294 lb 9.6 oz(133.63 kg).   Imaging Review Plain radiographs demonstrate severe degenerative joint disease of the right knee(s). The overall alignment issignificant valgus. The bone quality appears to be satisfactory for age and reported activity level.  Assessment/Plan:  End stage arthritis, right knee   The patient history, physical examination, clinical judgment of the provider and imaging studies are consistent with end stage degenerative joint disease of the right knee(s) and total knee arthroplasty is deemed medically necessary. The treatment options including medical management, injection therapy arthroscopy and arthroplasty were discussed at length. The risks and benefits of total knee arthroplasty were presented and reviewed. The risks due to aseptic loosening, infection, stiffness, patella tracking problems, thromboembolic complications and other imponderables were discussed. The patient acknowledged the explanation, agreed to proceed with the plan and consent was signed. Patient is being admitted for inpatient treatment for surgery, pain control, PT, OT, prophylactic antibiotics, VTE prophylaxis, progressive ambulation and ADL's and discharge planning. The patient is planning to be discharged home with home health services. Surgery scheduled for 08/04/2012. He has obtained preoperative clearance medical PCP Dr. Juanetta Gosling, cardiac Dr. Leighton Ruff at  [place].  May benefit from longer anticoagulation due to smoking status.

## 2012-08-04 NOTE — Anesthesia Procedure Notes (Addendum)
Anesthesia Regional Block:  Femoral nerve block  Pre-Anesthetic Checklist: ,, timeout performed, Correct Patient, Correct Site, Correct Laterality, Correct Procedure, Correct Position, site marked, Risks and benefits discussed,  Surgical consent,  Pre-op evaluation,  At surgeon's request and post-op pain management  Laterality: Right  Prep: chloraprep       Needles:  Injection technique: Single-shot  Needle Type: Echogenic Stimulator Needle          Additional Needles:  Procedures: ultrasound guided (picture in chart) and nerve stimulator Femoral nerve block  Nerve Stimulator or Paresthesia:  Response: 0.4 mA,   Additional Responses:   Narrative:  Start time: 08/04/2012 9:15 AM End time: 08/04/2012 9:30 AM Injection made incrementally with aspirations every 5 mL.  Performed by: Personally  Anesthesiologist: Arta Bruce MD  Additional Notes: Monitors applied. Patient sedated. Sterile prep and drape,hand hygiene and sterile gloves were used. Relevant anatomy identified.Needle position confirmed.Local anesthetic injected incrementally after negative aspiration. Local anesthetic spread visualized around nerve(s). Vascular puncture avoided. No complications. Image printed for medical record.The patient tolerated the procedure well.       Femoral nerve block Procedure Name: LMA Insertion Date/Time: 08/04/2012 10:50 AM Performed by: Rossie Muskrat L Pre-anesthesia Checklist: Patient identified, Timeout performed, Emergency Drugs available, Suction available and Patient being monitored Patient Re-evaluated:Patient Re-evaluated prior to inductionOxygen Delivery Method: Circle system utilized Preoxygenation: Pre-oxygenation with 100% oxygen Intubation Type: IV induction Ventilation: Mask ventilation without difficulty LMA: LMA inserted LMA Size: 5.0 Number of attempts: 1 Placement Confirmation: breath sounds checked- equal and bilateral and positive ETCO2 Tube secured with:  Tape Dental Injury: Teeth and Oropharynx as per pre-operative assessment

## 2012-08-05 ENCOUNTER — Inpatient Hospital Stay (HOSPITAL_COMMUNITY): Payer: Medicare Other

## 2012-08-05 DIAGNOSIS — F419 Anxiety disorder, unspecified: Secondary | ICD-10-CM | POA: Diagnosis present

## 2012-08-05 DIAGNOSIS — I2510 Atherosclerotic heart disease of native coronary artery without angina pectoris: Secondary | ICD-10-CM | POA: Diagnosis present

## 2012-08-05 DIAGNOSIS — M1990 Unspecified osteoarthritis, unspecified site: Secondary | ICD-10-CM | POA: Diagnosis present

## 2012-08-05 DIAGNOSIS — G4700 Insomnia, unspecified: Secondary | ICD-10-CM | POA: Diagnosis present

## 2012-08-05 LAB — BASIC METABOLIC PANEL
Calcium: 8.2 mg/dL — ABNORMAL LOW (ref 8.4–10.5)
GFR calc Af Amer: >90 mL/min (ref >90)
GFR calc non Af Amer: 87 mL/min — ABNORMAL LOW (ref >90)
Potassium: 4.3 mEq/L (ref 3.5–5.1)
Sodium: 135 mEq/L (ref 135–145)

## 2012-08-05 LAB — CBC
Hemoglobin: 12.8 g/dL — ABNORMAL LOW (ref 13.0–17.0)
MCH: 32.3 pg (ref 26.0–34.0)
MCHC: 35 g/dL (ref 30.0–36.0)
Platelets: 137 10*3/uL — ABNORMAL LOW (ref 150–400)
RDW: 13.6 % (ref 11.5–15.5)

## 2012-08-05 MED ORDER — ACETAMINOPHEN 10 MG/ML IV SOLN: 1000.0000 mg | Freq: Four times a day (QID) | INTRAVENOUS | Status: DC

## 2012-08-05 MED ORDER — ACETAMINOPHEN 10 MG/ML IV SOLN
1000.0000 mg | Freq: Four times a day (QID) | INTRAVENOUS | Status: AC
  Administered 2012-08-05 – 2012-08-06 (×4): 1000 mg via INTRAVENOUS
  Filled 2012-08-05 (×4): qty 100

## 2012-08-05 NOTE — Progress Notes (Signed)
Staff called to the room by family member. After multiple instructions to call for assistance, pt and family walked patient to the bathroom without the knee immobilizer in place and smoked a cigarette in the bathroom. When pt tried to get off the toilet, his knee buckled, per his report, and family members caught him and held him up. Pt DID NOT hit the floor, but the knee was flexed at approximately 110-120 degrees in a squatting position. Hemovac pulled out. Pt had difficult time ambulating back to the bed, knee was weak/ pt c/o ++ pain. Pt very apologetic and says he "won't make that mistake again." Staff stressed importance of calling for assistance and not smoking in the room. Family brought Nicoderm patches. RN informed her we could not place the patch on the patient without MD order and educated pt on contraindications to use of the patch in post-op patients (inhibits healing.) Patient and family verbalized understanding. PA on call notified. Order for stat knee xray obtained. Will continue to monitor.

## 2012-08-05 NOTE — Progress Notes (Signed)
Occupational Therapy Evaluation Patient Details Name: [name] [name] MRN: [id number] DOB: 1941/03/31 Today's Date: 08/05/2012 Time: 4782-9562 OT Time Calculation (min): 27 min  OT Assessment / Plan / Recommendation Clinical Impression  71 yo s/p R TKA. WBAT. Good control R knee. Pt will benefit from skilled OT services to max independence with ADL and functional mobility for ADL to facilitate D/C home with 24/7 S of daughter.    OT Assessment  Patient needs continued OT Services    Follow Up Recommendations  No OT follow up    Barriers to Discharge None    Equipment Recommendations  None recommended by OT    Recommendations for Other Services    Frequency  Min 2X/week    Precautions / Restrictions Precautions Precautions: Knee Required Braces or Orthoses: Knee Immobilizer - Right Knee Immobilizer - Right: On when out of bed or walking Restrictions Weight Bearing Restrictions: Yes RLE Weight Bearing: Weight bearing as tolerated   Pertinent Vitals/Pain 5.  premedicated    ADL  Eating/Feeding: Independent Where Assessed - Eating/Feeding: Chair Grooming: Set up;Supervision/safety Where Assessed - Grooming: Unsupported sitting Upper Body Bathing: Set up Where Assessed - Upper Body Bathing: Supported sitting Lower Body Bathing: Maximal assistance Where Assessed - Lower Body Bathing: Supported sit to stand Upper Body Dressing: Supervision/safety;Set up Where Assessed - Upper Body Dressing: Supported sitting Lower Body Dressing: Maximal assistance Where Assessed - Lower Body Dressing: Supported sit to Pharmacist, hospital: Minimal assistance Toilet Transfer Method: Sit to Barista: Other (comment) (bed - chair) Toileting - Clothing Manipulation and Hygiene: Minimal assistance Where Assessed - Glass blower/designer Manipulation and Hygiene: Standing Equipment Used: Gait belt;Rolling walker Transfers/Ambulation Related to ADLs: Mod A sit - stand.  Good control R knee without KI ADL Comments: Max vc for hand placement and safety    OT Diagnosis: Generalized weakness;Acute pain  OT Problem List: Decreased strength;Decreased range of motion;Decreased activity tolerance;Pain;Decreased knowledge of use of DME or AE;Decreased knowledge of precautions;Obesity OT Treatment Interventions: Self-care/ADL training;DME and/or AE instruction;Therapeutic activities;Patient/family education   OT Goals Acute Rehab OT Goals OT Goal Formulation: With patient Time For Goal Achievement: 08/12/12 Potential to Achieve Goals: Good ADL Goals Pt Will Perform Lower Body Bathing: with supervision;Sit to stand from chair;with adaptive equipment;with cueing (comment type and amount) ADL Goal: Lower Body Bathing - Progress: Goal set today Pt Will Perform Lower Body Dressing: with supervision;with set-up;Sit to stand from chair;Unsupported;with adaptive equipment;with cueing (comment type and amount) ADL Goal: Lower Body Dressing - Progress: Goal set today Pt Will Transfer to Toilet: with supervision;Ambulation;3-in-1;Maintaining weight bearing status ADL Goal: Toilet Transfer - Progress: Goal set today Pt Will Perform Toileting - Clothing Manipulation: with modified independence;Standing ADL Goal: Toileting - Clothing Manipulation - Progress: Goal set today Pt Will Perform Toileting - Hygiene: with modified independence;Standing at 3-in-1/toilet ADL Goal: Toileting - Hygiene - Progress: Goal set today  Visit Information  Last OT Received On: 08/05/12 Assistance Needed: +1    Subjective Data      Prior Functioning     Home Living Lives With: Daughter Available Help at Discharge: Family;Available 24 hours/day Type of Home: House Home Access: Stairs to enter [employer] of Steps: 2 Entrance Stairs-Rails: None Home Layout: One level Bathroom Shower/Tub: Engineer, manufacturing systems: Standard Bathroom Accessibility: Yes How Accessible:  Accessible via walker Home Adaptive Equipment: Walker - rolling;Straight cane Prior Function Level of Independence: Independent Able to Take Stairs?: Yes Driving: Yes Vocation: Part time employment Communication Communication: [facility]  Dominant Hand: Right         Vision/Perception     Cognition  Overall Cognitive Status: No family/caregiver present to determine baseline cognitive functioning Arousal/Alertness: Awake/alert Orientation Level: Oriented X4 / Intact Behavior During Session: WFL for tasks performed Cognition - Other Comments: decreased recall of precautions during eval    Extremity/Trunk Assessment Right Upper Extremity Assessment RUE ROM/Strength/Tone: WFL for tasks assessed RUE Sensation: WFL - Light Touch;WFL - Proprioception RUE Coordination: WFL - gross/fine motor Left Upper Extremity Assessment LUE ROM/Strength/Tone: WFL for tasks assessed LUE Sensation: WFL - Light Touch;WFL - Proprioception LUE Coordination: WFL - gross/fine motor Right Lower Extremity Assessment RLE ROM/Strength/Tone: Deficits;Due to precautions;Due to pain RLE ROM/Strength/Tone Deficits: hip/ankle wfl. knee: able to complete quad set and initiate SLR Left Lower Extremity Assessment LLE ROM/Strength/Tone: [facility] for tasks assessed Trunk Assessment Trunk Assessment: Normal     Mobility Bed Mobility Bed Mobility: Supine to Sit Supine to Sit: 5: Supervision;With rails;HOB elevated Details for Bed Mobility Assistance: strong use of rails, I'ly managed R LE Transfers Transfers: Sit to Stand;Stand to Sit Sit to Stand: 4: Min assist;From bed;With upper extremity assist Stand to Sit: 4: Min assist;With armrests;To chair/3-in-1 Details for Transfer Assistance: Max vc for hand placement     Shoulder Instructions     Exercise Total Joint Exercises Ankle Circles/Pumps: AROM;Both;10 reps;Supine Quad Sets: AROM;Right;10 reps;Supine   Balance     End of Session OT - End of Session Equipment  Utilized During Treatment: Gait belt Activity Tolerance: Patient tolerated treatment well Patient left: in chair;with call bell/phone within reach  GO     Norell Brisbin,HILLARY 08/05/2012, 12:21 PM [facility], OTR/L  [phone] 08/05/2012

## 2012-08-05 NOTE — Progress Notes (Signed)
Physical Therapy Treatment Note   08/05/12 1534  PT Visit Information  Last PT Received On 08/05/12  Assistance Needed +1  PT Time Calculation  PT Start Time 1534  PT Stop Time 1556  PT Time Calculation (min) 22 min  Subjective Data  Subjective Pt received supine in bed with c/o 12/10 R knee pain  Precautions  Precautions Knee  Restrictions  RLE Weight Bearing WBAT  Cognition  Overall Cognitive Status Appears within functional limits for tasks assessed/performed  Arousal/Alertness Awake/alert  Orientation Level Oriented X4 / Intact  Behavior During Session [facility] for tasks performed  Bed Mobility  Bed Mobility Supine to Sit  Supine to Sit 5: Supervision;With rails;HOB elevated  Sit to Supine 4: Min guard;HOB flat (assist for R LE)  Details for Bed Mobility Assistance strong use of rails, I'ly managed R LE  Transfers  Transfers Sit to Stand;Stand to Sit  Sit to Stand 4: Min assist;From bed;With upper extremity assist  Stand to Sit 4: Min assist;With armrests;To chair/3-in-1  Details for Transfer Assistance Max vc for hand placement  Ambulation/Gait  Ambulation/Gait Assistance 4: Min guard  Ambulation Distance (Feet) 60 Feet  Assistive device Rolling walker  Ambulation/Gait Assistance Details no episodes of buckling, increased bilat UE WBing  Gait Pattern WFL;Decreased step length - right;Decreased stance time - right;Decreased weight shift to right  Gait velocity slow  Stairs No  PT - End of Session  Equipment Utilized During Treatment Gait belt  Activity Tolerance Patient tolerated treatment well  Patient left in bed;in CPM;with call bell/phone within reach (CPM set to 75 degrees, pt tolerating well)  Nurse Communication Mobility status (left pt in CPM)  PT - Assessment/Plan  Comments on Treatment Session Patient with improved ambulation tolerance despite patient report 12/10 pain when asked in R knee. Pt tolerated ambulation well without R KI. Anticipated patient to be  safe for d/c home when approved by MD.  PT Plan Discharge plan remains appropriate;Frequency remains appropriate  PT Frequency 7X/week  Follow Up Recommendations Home health PT;Supervision/Assistance - 24 hour  Equipment Recommended None recommended by PT  Acute Rehab PT Goals  PT Goal: Supine/Side to Sit - Progress Progressing toward goal  PT Goal: Sit to Stand - Progress Progressing toward goal  PT Goal: Ambulate - Progress Progressing toward goal  PT Goal: Perform Home Exercise Program - Progress Progressing toward goal    Pain: 12/10 R knee pain  Lewis Shock, PT, DPT Pager #: [phone] Office #: [phone]

## 2012-08-05 NOTE — Progress Notes (Signed)
[facility] (OTS)  Subjective: 1 Day Post-Op Procedure(s) (LRB): TOTAL KNEE ARTHROPLASTY (Right)  Doing well Tolerating diet Passing gas, "enough to blow up a battleship" Pain tolerable.  States he is an 8/10 but looks very comfortable  Denies CP, No SOB No h/a No abdominal pain  Waiting to work with therapy or get into CPM  Objective: Current Vitals Blood pressure 120/72, pulse 90, temperature 97.7 F (36.5 C), temperature source Oral, resp. rate 18, height 5\' 11"  (1.803 m), weight 124.739 kg (275 lb), SpO2 97.00%. Vital signs in last 24 hours: Temp:  [97.7 F (36.5 C)-98.7 F (37.1 C)] 97.7 F (36.5 C) (12/07 0550) Pulse Rate:  [82-93] 90  (12/07 0550) Resp:  [14-24] 18  (12/07 0800) BP: (98-151)/(43-73) 120/72 mmHg (12/07 0550) SpO2:  [95 %-100 %] 97 % (12/07 0800) Weight:  [124.739 kg (275 lb)] 124.739 kg (275 lb) (12/06 1515)  Intake/Output from previous day: 12/06 0701 - 12/07 0700 In: 1805 [I.V.:1750; IV Piggyback:55] Out: 2750 [Urine:1750; Drains:900; Blood:100] Intake/Output      12/06 0701 - 12/07 0700 12/07 0701 - 12/08 0700   I.V. (mL/kg) 1750 (14)    IV Piggyback 55    Total Intake(mL/kg) 1805 (14.5)    Urine (mL/kg/hr) 1750 (0.6)    Drains 900    Blood 100    Total Output 2750    Net -945           LABS  Basename 08/05/12 0650  HGB 12.8*    Basename 08/05/12 0650  WBC 6.3  RBC 3.96*  HCT 36.6*  PLT 137*    Basename 08/05/12 0650  NA 135  K 4.3  CL 102  CO2 23  BUN 15  CREATININE 0.85  GLUCOSE 127*  CALCIUM 8.2*   No results found for this basename: LABPT:2,INR:2 in the last 72 hours   Physical Exam  Gen: Awake and alert, NAD Lungs:clear ant fields, dec at bases Cardiac: s1 and s2 Abd: obese, + BS Ext:      Right Leg  Dressing c/d/i  Drain patent  DPN, SPN, TN sensation intact  EHL, FHL, AT, PT, peroneals and gastroc motor intact  + DP pulse  Swelling stable  Knee immobilizer fitting  well      Imaging No results found.  Assessment/Plan: 1 Day Post-Op Procedure(s) (LRB): TOTAL KNEE ARTHROPLASTY (Right)  71 y/o male s/p R TKA  1. endstage R knee DJD, s/p TKA POD 1  WBAT  Total knee precautions  Therapies  Continue with cpm  Dressing change tomorrow 2. Nicotine dependence  No patches, supplements 3. OSA  Continue with bipap 4. CAD, RLS, anxiety  Home meds 5. Pain  Encourage po meds  IV tylenol for another 24 hours 6. FEN  Advance diet  KVO IV  D/c foley    IS q 1 hour while awake  7. DVT/PE prophylaxis  Lovenox 8. Activity  OOB as tolerated  PT/OT evals today  Possibly d/c home tomorrow or monday   Mearl Latin, PA-C [facility] [phone] (P) 08/05/2012, 8:50 AM

## 2012-08-05 NOTE — Evaluation (Signed)
Physical Therapy Evaluation Patient Details Name: [name] [name] MRN: [id number] DOB: [birth year] Today's Date: 08/05/2012 Time: 4098-1191 PT Time Calculation (min): 30 min  PT Assessment / Plan / Recommendation Clinical Impression  Pt s/p R TKA presenting with anticipated R knee pain, decreased R knee AROM and decreased R LE strength. Patient progressing well with mobility. Anticpate patient to be safe to return home with 24/7 supervision provided by daughter, HHPT, and use of RW. Pt to benefit from HHPT to address mentioned impairments.    PT Assessment  Patient needs continued PT services    Follow Up Recommendations  Home health PT;Supervision/Assistance - 24 hour    Does the patient have the potential to tolerate intense rehabilitation      Barriers to Discharge None      Equipment Recommendations  None recommended by PT (pt has DME)    Recommendations for Other Services     Frequency 7X/week    Precautions / Restrictions Precautions Precautions: Knee Required Braces or Orthoses: Knee Immobilizer - Right Knee Immobilizer - Right: On when out of bed or walking Restrictions Weight Bearing Restrictions: Yes RLE Weight Bearing: Weight bearing as tolerated   Pertinent Vitals/Pain 8/10 R knee pain prior to PT, 10/10 post PT      Mobility  Bed Mobility Bed Mobility: Supine to Sit Supine to Sit: 5: Supervision;With rails;HOB elevated Details for Bed Mobility Assistance: strong use of rails, I'ly managed R LE Transfers Transfers: Sit to Stand;Stand to Sit Sit to Stand: 4: Min assist;From bed;With upper extremity assist Stand to Sit: 4: Min assist;With armrests;To chair/3-in-1 Details for Transfer Assistance: v/c's for hand placement and R LE management Ambulation/Gait Ambulation/Gait Assistance: 4: Min assist Ambulation Distance (Feet): 5 Feet (to chair) Assistive device: Rolling walker Ambulation/Gait Assistance Details: v/c's to maintain R quad set in stance  phase, pt with report of 10/10 pain with ambulation Gait Pattern: Within Functional Limits;Decreased step length - right;Decreased stance time - right;Decreased weight shift to right Gait velocity: slow General Gait Details: increased UE wbing Stairs: No Wheelchair Mobility Wheelchair Mobility: No    Shoulder Instructions     Exercises Total Joint Exercises Ankle Circles/Pumps: AROM;Both;10 reps;Supine Quad Sets: AROM;Right;10 reps;Supine   PT Diagnosis: Difficulty walking  PT Problem List: Decreased strength;Decreased range of motion;Decreased activity tolerance;Decreased balance PT Treatment Interventions: DME instruction;Gait training;Stair training;Functional mobility training;Therapeutic activities;Therapeutic exercise   PT Goals Acute Rehab PT Goals PT Goal Formulation: With patient Time For Goal Achievement: 08/12/12 Potential to Achieve Goals: Good Pt will go Supine/Side to Sit: with modified independence;with HOB 0 degrees PT Goal: Supine/Side to Sit - Progress: Goal set today Pt will go Sit to Stand: with modified independence;with upper extremity assist (up to RW) PT Goal: Sit to Stand - Progress: Goal set today Pt will Ambulate: >150 feet;with modified independence;with rolling walker PT Goal: Ambulate - Progress: Goal set today Pt will Go Up / Down Stairs: 1-2 stairs;with supervision;with rolling walker (using backwards technique.) PT Goal: Up/Down Stairs - Progress: Goal set today Pt will Perform Home Exercise Program: Independently PT Goal: Perform Home Exercise Program - Progress: Goal set today  Visit Information  Last PT Received On: 08/05/12 PT/OT Co-Evaluation/Treatment: Yes    Subjective Data  Subjective: Everything hurts   Prior Functioning  Home Living Lives With: Daughter Available Help at Discharge: Family;Available 24 hours/day Type of Home: House Home Access: Stairs to enter [employer] of Steps: 2 Entrance Stairs-Rails: None Home  Layout: One level Bathroom Shower/Tub: Tub/shower unit [employer]  Toilet: Standard Bathroom Accessibility: Yes How Accessible: Accessible via walker Home Adaptive Equipment: Walker - rolling;Straight cane Prior Function Level of Independence: Independent Able to Take Stairs?: Yes Driving: Yes Vocation: Part time employment Communication Communication: No difficulties Dominant Hand: Right    Cognition  Overall Cognitive Status: Appears within functional limits for tasks assessed/performed Arousal/Alertness: Awake/alert Orientation Level: Oriented X4 / Intact Behavior During Session: [facility] for tasks performed    Extremity/Trunk Assessment Right Upper Extremity Assessment RUE ROM/Strength/Tone: [facility] for tasks assessed Left Upper Extremity Assessment LUE ROM/Strength/Tone: WFL for tasks assessed Right Lower Extremity Assessment RLE ROM/Strength/Tone: Deficits;Due to pain RLE ROM/Strength/Tone Deficits: hip/ankle wfl. knee: able to complete quad set and initiate SLR Left Lower Extremity Assessment LLE ROM/Strength/Tone: Within functional levels Trunk Assessment Trunk Assessment: Normal   Balance    End of Session PT - End of Session Equipment Utilized During Treatment: Gait belt Activity Tolerance: Patient tolerated treatment well Patient left: in chair;with call bell/phone within reach;with family/visitor present Nurse Communication: Mobility status  GP     Marcene Brawn 08/05/2012, 12:08 PM  Lewis Shock, PT, DPT Pager #: [phone] Office #: [phone]

## 2012-08-05 NOTE — Op Note (Signed)
[name] [name], [name] [name] NO.:  [id number]  MEDICAL RECORD NO.:  [id number]  LOCATION:  [id number]                        FACILITY:  [facility]  PHYSICIAN:  Dyke Brackett, M.D.    DATE OF BIRTH:  Dec 03, 1940  DATE OF PROCEDURE:  08/04/2012 DATE OF DISCHARGE:                              OPERATIVE REPORT   PREOPERATIVE DIAGNOSIS:  Severe osteoarthritis, right knee with valgus deformity.  POSTOPERATIVE DIAGNOSIS:  Severe osteoarthritis, right knee with valgus deformity.  OPERATION:  Right cemented Sigma knee (size 5 femur, size 6 tibia, 10 mm bearing, 38 mm all poly patella).  Lateral release.  SURGEON:  Dyke Brackett, MD  ASSISTANT:  Margart Sickles, PA-C  TOURNIQUET TIME:  Approximately 78 minutes.  DESCRIPTION OF PROCEDURE:  Sterile prep and drape, exsanguination of leg, inflation to 375 mmHg, straight skin incision with medial parapatellar approach to the knee made.  We cut 11 mm distal femur due to a flexion contracture, and a 5 degree valgus cut for the right knee. We then cut about 3-4 mm below the most diseased lateral compartment, some imbalance was noted with a tight lateral compartment from valgus deformity leading to lateral release.  Then the extension gap was measured eventually at 10 mm.  We then sized the femur to be size 5.  We referenced off the anterior cortex with a 10 mm chamfer cut, the anterior-posterior chamfer cuts after setting the rotation and the flexion gap was measured to be 12.5 equal to extension gap, but eventually again as I mentioned, we settled due to better mechanics established with a 10 mm bearing.  Attention was then directed to the tibia, we cut the keel for the tibia.  Prior to this excess menisci removed.  We trimmed posteriorly at least the PCL and again after completion of the tibial cut, made the box cut of the femur, placed a trial femur and the trial tibia.  Again, we felt like we obtained little more extension with 10 and  12.5, and there was still good stability and flexion.  I elected to use that as a bearing thickness.  Patella was cut leaving about 18 mm of native patella for an all poly 338 mm patella. Trials were next inserted.  The towel clips in place.  There was slight positive drawer and tendency for bearing spin out excellent.  Matching of medial and lateral support with good balance and as I mentioned negligible to 1+ drawer.  The trial components removed.  Bony surfaces irrigated, 2 batches of cement was put in the doughy state, cemented the tibia, followed by the femur patella.  We elected to use a trial bearing to trial off that again and again settled the 10 mm bearing placed with that trial, with the final bearing, after the tourniquet was released with a bearing out.  Then we noted no excess bleeding and no excess cement noted.  Final bearing placed and all parameters checked with towel clips and then closure was affected with #1 Ethibond, 0 and 2-0 Vicryl and skin clips.  Lightly compressive dressing and knee immobilizer was applied, taken to recovery room in stable condition.     Dyke Brackett, M.D.  WDC/MEDQ  D:  08/04/2012  T:  08/05/2012  Job:  [id number]

## 2012-08-06 LAB — CBC
Hemoglobin: 12.3 g/dL — ABNORMAL LOW (ref 13.0–17.0)
MCH: 32.1 pg (ref 26.0–34.0)
Platelets: 116 10*3/uL — ABNORMAL LOW (ref 150–400)
RBC: 3.83 MIL/uL — ABNORMAL LOW (ref 4.22–5.81)
WBC: 7.9 10*3/uL (ref 4.0–10.5)

## 2012-08-06 LAB — BASIC METABOLIC PANEL
CO2: 23 mEq/L (ref 19–32)
Calcium: 8.5 mg/dL (ref 8.4–10.5)
GFR calc non Af Amer: >90 mL/min (ref >90)
Glucose, Bld: 125 mg/dL — ABNORMAL HIGH (ref 70–99)
Potassium: 4.3 mEq/L (ref 3.5–5.1)
Sodium: 133 mEq/L — ABNORMAL LOW (ref 135–145)

## 2012-08-06 NOTE — Progress Notes (Signed)
NCM faxed [facility] facesheet and to make aware of pt's scheduled d/c home today. Pt already set up with [facility] for [facility]. Added [facility] contact info to d/c instructions. Pt has DME at home. Isidoro Donning RN CCM Case Mgmt phone [phone]

## 2012-08-06 NOTE — Progress Notes (Signed)
Physical Therapy Treatment Patient Details Name: [name] [name] MRN: [id number] DOB: Oct 05, 1940 Today's Date: 08/06/2012 Time: 4098-1191 PT Time Calculation (min): 28 min  PT Assessment / Plan / Recommendation Comments on Treatment Session  pt rpesents with R TKA.  pt anxious to D/C to home.  Did well on steps and pt states he will have good help from family and friends at D/C.  At this point pt ready for D/C from PT stand point.  Will need HHPT.      Follow Up Recommendations  Home health PT;Supervision/Assistance - 24 hour     Does the patient have the potential to tolerate intense rehabilitation     Barriers to Discharge        Equipment Recommendations  None recommended by PT    Recommendations for Other Services    Frequency 7X/week   Plan Discharge plan remains appropriate;Frequency remains appropriate    Precautions / Restrictions Precautions Precautions: Knee Required Braces or Orthoses: Knee Immobilizer - Right Knee Immobilizer - Right: On when out of bed or walking Restrictions Weight Bearing Restrictions: Yes RLE Weight Bearing: Weight bearing as tolerated   Pertinent Vitals/Pain Indicates pain in R knee with mobility, but did not rate.  RN made aware.    Mobility  Bed Mobility Bed Mobility: Supine to Sit;Sitting - Scoot to Edge of Bed Supine to Sit: 4: Min assist Sitting - Scoot to Edge of Bed: 5: Supervision Details for Bed Mobility Assistance: A with R LE stuck on linens only.   Transfers Transfers: Sit to Stand;Stand to Sit Sit to Stand: 4: Min assist;With upper extremity assist;From bed;From chair/3-in-1 Stand to Sit: 4: Min guard;With upper extremity assist;To chair/3-in-1;With armrests Details for Transfer Assistance: cues for hand placement with each transfer as pt continually tries to hold front of RW.   Ambulation/Gait Ambulation/Gait Assistance: 4: Min guard Ambulation Distance (Feet): 120 Feet (x2) Assistive device: Rolling  walker Ambulation/Gait Assistance Details: cues for upright posture Gait Pattern: Step-through pattern;Decreased stride length;Trunk flexed Stairs: Yes Stairs Assistance: 4: Min assist Stairs Assistance Details (indicate cue type and reason): cues for safe technique, A with movement of RW only.   Stair Management Technique: Backwards;With walker Number of Stairs: 1  (x2) Wheelchair Mobility Wheelchair Mobility: No    Exercises     PT Diagnosis:    PT Problem List:   PT Treatment Interventions:     PT Goals Acute Rehab PT Goals Time For Goal Achievement: 08/12/12 PT Goal: Supine/Side to Sit - Progress: Progressing toward goal PT Goal: Sit to Stand - Progress: Progressing toward goal PT Goal: Ambulate - Progress: Progressing toward goal PT Goal: Up/Down Stairs - Progress: Progressing toward goal  Visit Information  Last PT Received On: 08/06/12 Assistance Needed: +1    Subjective Data  Subjective: pt notes he is ready to do the stairs so he can go home.     Cognition  Overall Cognitive Status: Appears within functional limits for tasks assessed/performed Arousal/Alertness: Awake/alert Orientation Level: Oriented X4 / Intact Behavior During Session: [facility] for tasks performed    Balance  Balance Balance Assessed: No  End of Session PT - End of Session Equipment Utilized During Treatment: Gait belt;Right knee immobilizer Activity Tolerance: Patient tolerated treatment well Patient left: in chair;with call bell/phone within reach Nurse Communication: Mobility status   GP     Sunny Schlein, [place] [phone] 08/06/2012, 9:37 AM

## 2012-08-06 NOTE — Progress Notes (Signed)
D/C instructions reviewed with patient. RX x 3 given. hh equipment at home. hh services confirmed with [facility]. All questions answered. Pt successfully return demonstrated lovenox self administration, expressed comfort with procedure. Pt d/c'ed via wheelchair in stable condition

## 2012-08-06 NOTE — Progress Notes (Signed)
[facility]thopaedic Trauma Service (OTS)  Subjective: 2 Days Post-Op Procedure(s) (LRB): TOTAL KNEE ARTHROPLASTY (Right)   Pt had a near fall yesterday evening Mobilized to the BR w/o assistance and w/o KI.  Smoked in the BR as well Upon trying to stand up his knee buckled.  Pt had difficulty getting back to bed and had some knee pain. Xrays ordered and are negative for fx  Currently pt doing better  Adamant about going home today Denies CP, no SOB No abd pain +flatus, + void Tolerating diet No dizziness or lightheadedness  Objective: Current Vitals Blood pressure 129/74, pulse 92, temperature 99.8 F (37.7 C), temperature source Oral, resp. rate 16, height 5\' 11"  (1.803 m), weight 124.739 kg (275 lb), SpO2 93.00%. Vital signs in last 24 hours: Temp:  [98.4 F (36.9 C)-100.9 F (38.3 C)] 99.8 F (37.7 C) (12/08 0631) Pulse Rate:  [89-92] 92  (12/08 0631) Resp:  [16-18] 16  (12/08 0631) BP: (104-129)/(56-74) 129/74 mmHg (12/08 0631) SpO2:  [93 %-94 %] 93 % (12/08 0631)  Intake/Output from previous day: 12/07 0701 - 12/08 0700 In: 120 [P.O.:120] Out: 725 [Urine:500; Blood:225] Intake/Output      12/07 0701 - 12/08 0700 12/08 0701 - 12/09 0700   P.O. 120    I.V. (mL/kg)     IV Piggyback     Total Intake(mL/kg) 120 (1)    Urine (mL/kg/hr) 500 (0.2)    Drains     Blood 225    Total Output 725    Net -605         Urine Occurrence 1 x      LABS  Basename 08/06/12 0436 08/05/12 0650  HGB 12.3* 12.8*    Basename 08/06/12 0436 08/05/12 0650  WBC 7.9 6.3  RBC 3.83* 3.96*  HCT 35.0* 36.6*  PLT 116* 137*    Basename 08/06/12 0436 08/05/12 0650  NA 133* 135  K 4.3 4.3  CL 101 102  CO2 23 23  BUN 14 15  CREATININE 0.75 0.85  GLUCOSE 125* 127*  CALCIUM 8.5 8.2*   No results found for this basename: LABPT:2,INR:2 in the last 72 hours   Physical Exam  WUJ:WJXBJ and alert, in bed, reading newspaper, NAD Lungs:decreased throughout but now wheezes or crackles  noted Cardiac: distant, s1 and s2, reg Abd: + BS, NT Ext:      Right Lower Extremity  Incision looks excellent  No drainage  + swelling  Dpn, spn, tn sensation intact  EHL, FHL, AT, PT, peroneals and gastroc motor intact  No dct, compartments soft and nontender  Ext warm  +dp pulse    Imaging Dg Knee Complete 4 Views Right  08/05/2012  *RADIOLOGY REPORT*  Clinical Data: Fall, knee pain.  RIGHT KNEE - COMPLETE 4+ VIEW  Comparison: None.  Findings: Changes of knee replacement.  Mild anterior soft tissue swelling diffusely.  Small joint effusion.  No hardware or acute bony abnormality.  IMPRESSION: Right knee replacement.  Anterior soft tissue swelling and small joint effusion.  No acute bony abnormality.   Original Report Authenticated By: Charlett Nose, M.D.     Assessment/Plan: 2 Days Post-Op Procedure(s) (LRB): TOTAL KNEE ARTHROPLASTY (Right)  71 y/o male s/p R TKA   1. endstage R knee DJD, s/p TKA POD 2  WBAT   Total knee precautions   Therapies    Pt needs to complete stairs today before d/c home  Continue with cpm   Dressing changed 2. Nicotine dependence   No patches,  supplements  3. OSA   Continue with bipap  4. CAD, RLS, anxiety   Home meds  5. Pain   po meds  6. FEN   IS q 1 hour while awake   Hyponatremia- mild, d/c ivf, asymptomatic 7. DVT/PE prophylaxis   Lovenox  8. Activity   D/c home today after therapies  F/u with Dr. Madelon Lips w/in 2 weeks   Mearl Latin, PA-C [facility] [phone] (P) 08/06/2012, 8:46 AM

## 2012-08-06 NOTE — Discharge Summary (Signed)
Orthopaedic Trauma Service (OTS) Cross Cover      Discharge Summary  Patient ID: [name] [name] MRN: [id number] DOB/AGE: 02-27-1941 71 y.o.  Admit date: 08/04/2012 Discharge date: 08/06/2012  Admission Diagnoses: Endstage djd R knee Tobacco dependence OSA RLS CAD Insomnia Anxiety   Discharge Diagnoses:  Principal Problem:  *Osteoarthritis Active Problems:  TOBACCO ABUSE  SLEEP APNEA, OBSTRUCTIVE  RESTLESS LEG SYNDROME  CAD (coronary artery disease)  Insomnia  Anxiety   Procedures Performed: (08/04/2012)  Right total knee arthroplasty- Right cemented Sigma knee (size 5 femur, size 6 tibia, 10 mm  bearing, 38 mm all poly patella). Lateral release  Discharged Condition: good  Hospital Course:    Patient is a 71 year old Caucasian who is admitted on 08/04/2012 for right total knee arthroplasty. Patient underwent the procedure described above. Tolerated procedure well. After surgery transferred to the PACU for recovery from anesthesia and then was transferred to the orthopedic floor for observation, pain control and to begin therapies. Patient's hospital stay was relatively uncomplicated however on postoperative day #1 patient attempted to mobilize without his knee immobilizer. Upon standing he did feel his knee buckle and had immediate onset of pain. Followup x-rays were obtained which did not demonstrate any acute fracture or loosening of his internal components. Patient was started on Lovenox for DVT and PE prophylaxis on postop day #1. He began to work with physical therapy on postoperative day #1 as well. Foley was discontinued on postop day #1. On postop day #2 patient was doing much better. The pain was well-controlled. And patient was eager to discharge to home. Patient completed and required physical therapy components in order to the discharged home on postop day #2. At the time of discharge patient was tolerating a diet, voiding without difficulty. Patient was deemed  stable for discharge on postoperative day #2.   Consults: None  Significant Diagnostic Studies:   labs:    CBC    Component Value Date/Time   WBC 7.9 08/06/2012 0436   RBC 3.83* 08/06/2012 0436   HGB 12.3* 08/06/2012 0436   HCT 35.0* 08/06/2012 0436   PLT 116* 08/06/2012 0436   MCV 91.4 08/06/2012 0436   MCH 32.1 08/06/2012 0436   MCHC 35.1 08/06/2012 0436   RDW 13.7 08/06/2012 0436   LYMPHSABS 1.0 07/28/2012 1241   MONOABS 0.4 07/28/2012 1241   EOSABS 0.1 07/28/2012 1241   BASOSABS 0.0 07/28/2012 1241    BMET    Component Value Date/Time   NA 133* 08/06/2012 0436   K 4.3 08/06/2012 0436   CL 101 08/06/2012 0436   CO2 23 08/06/2012 0436   GLUCOSE 125* 08/06/2012 0436   BUN 14 08/06/2012 0436   CREATININE 0.75 08/06/2012 0436   CALCIUM 8.5 08/06/2012 0436   GFRNONAA >90 08/06/2012 0436   GFRAA >90 08/06/2012 0436     and radiology: X-Ray: R knee post fall on POD1    Negative for acute fx    Stable components  Treatments: IV hydration, antibiotics: Ancef, analgesia: IV acetaminophen, Dilaudid and Oxy IR, celebrex, anticoagulation: LMW heparin, therapies: PT, OT, RN and [employer] and surgery: as above  Discharge Exam:  Orthopaedic Trauma Service (OTS)  Subjective:  2 Days Post-Op Procedure(s) (LRB):  TOTAL KNEE ARTHROPLASTY (Right)  Pt had a near fall yesterday evening  Mobilized to the BR w/o assistance and w/o KI. Smoked in the BR as well  Upon trying to stand up his knee buckled. Pt had difficulty getting back to bed and had some  knee pain. Xrays ordered and are negative for fx  Currently pt doing better  Adamant about going home today  Denies CP, no SOB  No abd pain  +flatus, + void  Tolerating diet  No dizziness or lightheadedness  Objective:  Current Vitals  Blood pressure 129/74, pulse 92, temperature 99.8 F (37.7 C), temperature source Oral, resp. rate 16, height 5\' 11"  (1.803 m), weight 124.739 kg (275 lb), SpO2 93.00%.  Vital signs in last 24 hours:  Temp:  [98.4 F (36.9 C)-100.9 F (38.3 C)] 99.8 F (37.7 C) (12/08 0631)  Pulse Rate: [89-92] 92 (12/08 0631)  Resp: [16-18] 16 (12/08 0631)  BP: (104-129)/(56-74) 129/74 mmHg (12/08 0631)  SpO2: [93 %-94 %] 93 % (12/08 0631)  Intake/Output from previous day:  12/07 0701 - 12/08 0700  In: 120 [P.O.:120]  Out: 725 [Urine:500; Blood:225]  Intake/Output  12/07 0701 - 12/08 0700 12/08 0701 - 12/09 0700  P.O. 120  I.V. (mL/kg)  IV Piggyback  Total Intake(mL/kg) 120 (1)  Urine (mL/kg/hr) 500 (0.2)  Drains  Blood 225  Total Output 725  Net -605  Urine Occurrence 1 x   LABS   Basename  08/06/12 0436  08/05/12 0650   HGB  12.3*  12.8*     Basename  08/06/12 0436  08/05/12 0650   WBC  7.9  6.3   RBC  3.83*  3.96*   HCT  35.0*  36.6*   PLT  116*  137*     Basename  08/06/12 0436  08/05/12 0650   NA  133*  135   K  4.3  4.3   CL  101  102   CO2  23  23   BUN  14  15   CREATININE  0.75  0.85   GLUCOSE  125*  127*   CALCIUM  8.5  8.2*    No results found for this basename: LABPT:2,INR:2 in the last 72 hours  Physical Exam  NFA:OZHYQ and alert, in bed, reading newspaper, NAD  Lungs:decreased throughout but now wheezes or crackles noted  Cardiac: distant, s1 and s2, reg  Abd: + BS, NT  Ext:  Right Lower Extremity  Incision looks excellent  No drainage  + swelling  Dpn, spn, tn sensation intact  EHL, FHL, AT, PT, peroneals and gastroc motor intact  No dct, compartments soft and nontender  Ext warm  +dp pulse  Imaging  Dg Knee Complete 4 Views Right  08/05/2012 *RADIOLOGY REPORT* Clinical Data: Fall, knee pain. RIGHT KNEE - COMPLETE 4+ VIEW Comparison: None. Findings: Changes of knee replacement. Mild anterior soft tissue swelling diffusely. Small joint effusion. No hardware or acute bony abnormality. IMPRESSION: Right knee replacement. Anterior soft tissue swelling and small joint effusion. No acute bony abnormality. Original Report Authenticated By: Charlett Nose, M.D.    Assessment/Plan:  2 Days Post-Op Procedure(s) (LRB):  TOTAL KNEE ARTHROPLASTY (Right)  71 y/o male s/p R TKA  1. endstage R knee DJD, s/p TKA POD 2 WBAT  Total knee precautions  Therapies  Pt needs to complete stairs today before d/c home  Continue with cpm  Dressing changed  2. Nicotine dependence  No patches, supplements  3. OSA  Continue with bipap  4. CAD, RLS, anxiety  Home meds  5. Pain  po meds  6. FEN  IS q 1 hour while awake  Hyponatremia- mild, d/c ivf, asymptomatic  7. DVT/PE prophylaxis  Lovenox  8. Activity  D/c home today after therapies  F/u with Dr. Madelon Lips w/in 2 weeks  Mearl Latin, PA-C  [facility]  [phone] (P)  08/06/2012, 8:46 AM   Disposition:   Discharge Orders    Future Orders Please Complete By Expires   Diet - low sodium heart healthy      Call MD / Call 911      Comments:   If you experience chest pain or shortness of breath, CALL 911 and be transported to the hospital emergency room.  If you develope a fever above 101 F, pus (white drainage) or increased drainage or redness at the wound, or calf pain, call your surgeon's office.   Constipation Prevention      Comments:   Drink plenty of fluids.  Prune juice may be helpful.  You may use a stool softener, such as Colace (over the counter) 100 mg twice a day.  Use MiraLax (over the counter) for constipation as needed.   Increase activity slowly as tolerated      Discharge instructions      Comments:   Daily dressing changes Clean wound with soap and water  Weight bear as tolerated on operative extremity Continue with CPM machine to help with knee motion Motion as tolerated Right knee  NO SMOKING or NICOTINE products- can slow down wound and bone healing   Driving restrictions      Comments:   No driving   Lifting restrictions      Comments:   No lifting   Weight bearing as tolerated      CPM      Comments:   Continuous passive motion machine (CPM):       Use the CPM from 0 to 90 degrees for 6-8 hours per day.      You may increase by 10 degrees per day if you are at less than 90 degrees.  You may break it up into 2 or 3 sessions per day.      Use CPM until you are told to stop.   TED hose      Comments:   Use stockings (TED hose) for swelling control. You may remove them at night for sleeping.   Do not put a pillow under the knee. Place it under the heel.      Change dressing      Comments:   Change dressing daily with sterile 4 x 4 inch gauze dressing and apply TED hose.       Medication List     As of 08/06/2012  9:04 AM    STOP taking these medications         aspirin EC 81 MG tablet      HYDROcodone-acetaminophen 10-500 MG per tablet   Commonly known as: LORTAB      VOLTAREN 1 % Gel   Generic drug: diclofenac sodium      TAKE these medications         alprazolam 2 MG tablet   Commonly known as: XANAX   Take 2 mg by mouth at bedtime. For sleep      celecoxib 200 MG capsule   Commonly known as: CELEBREX   Take 200 mg by mouth 2 (two) times daily.      clonazePAM 1 MG tablet   Commonly known as: KLONOPIN   Take 1 mg by mouth At bedtime as needed. For sleep      enoxaparin 30 MG/0.3ML injection   Commonly known as: LOVENOX   Inject 0.3 mLs (30 mg  total) into the skin every 12 (twelve) hours.      gabapentin 300 MG capsule   Commonly known as: NEURONTIN   Take 300 mg by mouth 4 (four) times daily.      methocarbamol 500 MG tablet   Commonly known as: ROBAXIN   Take 1 tablet (500 mg total) by mouth 4 (four) times daily. Prn spasm      oxyCODONE-acetaminophen 5-325 MG per tablet   Commonly known as: PERCOCET/ROXICET   1-2 tabs po q4-6hrs prn pain      rOPINIRole 1 MG tablet   Commonly known as: REQUIP   Take 2 mg by mouth at bedtime.           Follow-up Information    Schedule an appointment as soon as possible for a visit with CAFFREY JR,W D, MD. (to be seen on 08/17/12)    Contact information:   [address] [place] [address] [phone]          Signed:  Mearl Latin, PA-C [facility] [phone] (P) 08/06/2012, 9:04 AM

## 2012-08-07 ENCOUNTER — Encounter (HOSPITAL_COMMUNITY): Payer: Self-pay | Admitting: Orthopedic Surgery

## 2012-08-14 ENCOUNTER — Telehealth: Payer: Self-pay | Admitting: Pulmonary Disease

## 2012-08-14 ENCOUNTER — Encounter: Payer: Self-pay | Admitting: Pulmonary Disease

## 2012-08-14 NOTE — Telephone Encounter (Signed)
CPAP 10/17/11 to 07/27/12>>Average AHI 1.9 with CPAP 12 cm H2O.  Will have my nurse inform pt that CPAP report looks good.  No change to current set up needed.

## 2012-08-17 NOTE — Telephone Encounter (Signed)
I spoke with patient about results and he verbalized understanding and had no questions 

## 2012-08-21 ENCOUNTER — Ambulatory Visit: Payer: Medicare Other | Attending: Orthopedic Surgery | Admitting: Physical Therapy

## 2012-08-21 DIAGNOSIS — M25669 Stiffness of unspecified knee, not elsewhere classified: Secondary | ICD-10-CM | POA: Insufficient documentation

## 2012-08-21 DIAGNOSIS — IMO0001 Reserved for inherently not codable concepts without codable children: Secondary | ICD-10-CM | POA: Insufficient documentation

## 2012-08-21 DIAGNOSIS — M25569 Pain in unspecified knee: Secondary | ICD-10-CM | POA: Insufficient documentation

## 2012-08-21 DIAGNOSIS — Z96659 Presence of unspecified artificial knee joint: Secondary | ICD-10-CM | POA: Insufficient documentation

## 2012-08-24 ENCOUNTER — Ambulatory Visit: Payer: Medicare Other | Admitting: Rehabilitation

## 2012-08-25 ENCOUNTER — Other Ambulatory Visit: Payer: Self-pay | Admitting: Pulmonary Disease

## 2012-08-28 NOTE — Telephone Encounter (Signed)
Please advise if okay to refill requip. Thanks Dr. Craige Cotta

## 2012-08-29 ENCOUNTER — Ambulatory Visit: Payer: Medicare Other | Admitting: Rehabilitation

## 2012-09-01 ENCOUNTER — Encounter: Payer: Medicare Other | Admitting: Physical Therapy

## 2012-09-04 ENCOUNTER — Ambulatory Visit: Payer: Medicare Other | Attending: Orthopedic Surgery | Admitting: Rehabilitation

## 2012-09-04 DIAGNOSIS — M25569 Pain in unspecified knee: Secondary | ICD-10-CM | POA: Insufficient documentation

## 2012-09-04 DIAGNOSIS — Z96659 Presence of unspecified artificial knee joint: Secondary | ICD-10-CM | POA: Insufficient documentation

## 2012-09-04 DIAGNOSIS — M25669 Stiffness of unspecified knee, not elsewhere classified: Secondary | ICD-10-CM | POA: Insufficient documentation

## 2012-09-04 DIAGNOSIS — IMO0001 Reserved for inherently not codable concepts without codable children: Secondary | ICD-10-CM | POA: Insufficient documentation

## 2012-09-05 ENCOUNTER — Other Ambulatory Visit: Payer: Self-pay | Admitting: Pulmonary Disease

## 2012-09-06 ENCOUNTER — Other Ambulatory Visit: Payer: Self-pay | Admitting: Pulmonary Disease

## 2012-09-06 ENCOUNTER — Ambulatory Visit: Payer: Medicare Other | Admitting: Rehabilitation

## 2012-09-06 MED ORDER — ALPRAZOLAM 2 MG PO TABS: 2.0000 mg | ORAL_TABLET | Freq: Every day | ORAL | Status: DC

## 2012-09-06 NOTE — Telephone Encounter (Signed)
Called spoke with patient, he is aware refills are to be called in to verified pharmacy.  Alprazolam as last refilled telephoned to [facility] [address] (attempted to give to the pharmacist but she requested it be left on the voicemail) > done.  SOOD,VINEET, MD 05/04/2012 12:19 PM Signed  Please send script for xanax 2 mg qhs. Dispense 30 pills with 3 refills.

## 2012-09-06 NOTE — Telephone Encounter (Signed)
Pt was last seen on 07/20/12. Last fill was on 05/04/12 with 3 refills. Is this okay to refill? Thanks.

## 2012-09-06 NOTE — Telephone Encounter (Signed)
Okay to refill? 

## 2012-09-12 ENCOUNTER — Ambulatory Visit: Payer: Medicare Other | Admitting: Physical Therapy

## 2012-09-14 ENCOUNTER — Ambulatory Visit: Payer: Medicare Other | Admitting: Physical Therapy

## 2012-09-15 ENCOUNTER — Ambulatory Visit: Payer: Medicare Other | Admitting: Physical Therapy

## 2012-09-18 ENCOUNTER — Ambulatory Visit: Payer: Medicare Other | Admitting: Rehabilitation

## 2012-09-20 ENCOUNTER — Ambulatory Visit: Payer: Medicare Other | Admitting: Rehabilitation

## 2012-09-22 ENCOUNTER — Encounter: Payer: Medicare Other | Admitting: Rehabilitation

## 2012-09-25 ENCOUNTER — Ambulatory Visit: Payer: Medicare Other | Admitting: Rehabilitation

## 2012-09-28 ENCOUNTER — Ambulatory Visit: Payer: Medicare Other | Admitting: Rehabilitation

## 2012-10-02 ENCOUNTER — Ambulatory Visit: Payer: Medicare Other | Attending: Orthopedic Surgery | Admitting: Rehabilitation

## 2012-10-02 DIAGNOSIS — Z96659 Presence of unspecified artificial knee joint: Secondary | ICD-10-CM | POA: Insufficient documentation

## 2012-10-02 DIAGNOSIS — M25569 Pain in unspecified knee: Secondary | ICD-10-CM | POA: Insufficient documentation

## 2012-10-02 DIAGNOSIS — M25669 Stiffness of unspecified knee, not elsewhere classified: Secondary | ICD-10-CM | POA: Insufficient documentation

## 2012-10-02 DIAGNOSIS — IMO0001 Reserved for inherently not codable concepts without codable children: Secondary | ICD-10-CM | POA: Insufficient documentation

## 2012-10-05 ENCOUNTER — Ambulatory Visit: Payer: Medicare Other | Admitting: Physical Therapy

## 2012-10-09 ENCOUNTER — Ambulatory Visit: Payer: Medicare Other | Admitting: Physical Therapy

## 2012-10-12 ENCOUNTER — Ambulatory Visit: Payer: Medicare Other | Admitting: Rehabilitation

## 2012-10-19 ENCOUNTER — Ambulatory Visit: Payer: Medicare Other | Admitting: Rehabilitation

## 2012-10-26 ENCOUNTER — Encounter: Payer: Medicare Other | Admitting: Rehabilitation

## 2012-11-02 ENCOUNTER — Encounter: Payer: Medicare Other | Admitting: Physical Therapy

## 2012-11-09 ENCOUNTER — Encounter: Payer: Medicare Other | Admitting: Physical Therapy

## 2012-11-15 ENCOUNTER — Other Ambulatory Visit: Payer: Self-pay | Admitting: Pulmonary Disease

## 2012-11-16 ENCOUNTER — Encounter: Payer: Medicare Other | Admitting: Rehabilitation

## 2012-11-23 ENCOUNTER — Encounter: Payer: Medicare Other | Admitting: Physical Therapy

## 2012-11-23 ENCOUNTER — Other Ambulatory Visit: Payer: Self-pay | Admitting: Pulmonary Disease

## 2012-12-24 ENCOUNTER — Other Ambulatory Visit: Payer: Self-pay | Admitting: Pulmonary Disease

## 2012-12-25 ENCOUNTER — Telehealth: Payer: Self-pay | Admitting: Pulmonary Disease

## 2012-12-25 NOTE — Telephone Encounter (Signed)
I have called RX into the pharmacy. Pt aware and nothing further was needed

## 2012-12-25 NOTE — Telephone Encounter (Signed)
Last refilled 09/06/12 #30 x 3 refills. Please advise if okay to refill Dr. Craige Cotta thanks

## 2013-01-10 ENCOUNTER — Telehealth: Payer: Self-pay | Admitting: Pulmonary Disease

## 2013-01-10 MED ORDER — CLONAZEPAM 1 MG PO TABS: 1.0000 mg | ORAL_TABLET | Freq: Every day | ORAL | Status: DC

## 2013-01-10 NOTE — Telephone Encounter (Signed)
Please send prescription for klonopin 1 mg qhs, dispense 30 with 5 refills.

## 2013-01-10 NOTE — Telephone Encounter (Signed)
Pt aware an rx called in

## 2013-01-10 NOTE — Telephone Encounter (Signed)
Pt requesting rx for Klonopin 1mg  ><Take 1 tablet at bed time. #30  No Known Allergies Dr Craige Cotta please advise  Thank you

## 2013-01-15 ENCOUNTER — Other Ambulatory Visit: Payer: Self-pay | Admitting: Pulmonary Disease

## 2013-01-19 NOTE — Telephone Encounter (Signed)
Dr. Craige Cotta please advise if okay to refill requip thanks

## 2013-01-30 ENCOUNTER — Encounter: Payer: Self-pay | Admitting: Pulmonary Disease

## 2013-01-30 ENCOUNTER — Ambulatory Visit (INDEPENDENT_AMBULATORY_CARE_PROVIDER_SITE_OTHER): Payer: Medicare Other | Admitting: Pulmonary Disease

## 2013-01-30 VITALS — BP 142/80 | HR 71 | Temp 98.0°F | Ht 71.5 in | Wt 301.8 lb

## 2013-01-30 DIAGNOSIS — G2581 Restless legs syndrome: Secondary | ICD-10-CM

## 2013-01-30 DIAGNOSIS — G4700 Insomnia, unspecified: Secondary | ICD-10-CM

## 2013-01-30 DIAGNOSIS — G4733 Obstructive sleep apnea (adult) (pediatric): Secondary | ICD-10-CM

## 2013-01-30 NOTE — Patient Instructions (Signed)
Call if you need to have klonopin dose increased Follow up in 6 months

## 2013-01-30 NOTE — Assessment & Plan Note (Signed)
He reports compliance with therapy and benefit from CPAP.

## 2013-01-30 NOTE — Assessment & Plan Note (Signed)
He feels that alprazolam helps him fall asleep, but not stay asleep.  Will continue clonazepam 1 mg and adjust up as needed.    Explained that he should not use alprazolam when using clonazepam.

## 2013-01-30 NOTE — Assessment & Plan Note (Signed)
Stable on requip.  Neurotin, and klonopin likely helping with this also.

## 2013-01-30 NOTE — Progress Notes (Signed)
  Chief Complaint  Patient presents with  . Sleep Apnea    Pt states her wears CPAP everynight x 7-9 hrs a night. denies any problems w/ mask/machine. Pt feels rested most of the time but not everyday.     History of Present Illness: [name] [name] is a 72 y.o. male with severe OSA, insomnia, and RLS.  CPAP is working well.    He was confused about what to do with alprazolam.  He has been taking 2 mg of this and 1 mg clonazopam nightly.  He sleeps through the night.  He continues with requip, and this is working well.  Tests: PSG 11/03/05>>AHI 93.1, SpO2 low 72% Echo 01/30/08>>nl LV systolic fx, mild LA dilation  He  has a past medical history of Hypertension; Hyperlipidemia; Rhinitis; Peripheral edema; Osteoarthritis; Obesity; CAD (coronary artery disease); ED (erectile dysfunction); OSA (obstructive sleep apnea); Restless leg syndrome; Insomnia; and Anxiety.  He  has past surgical history that includes Total knee arthroplasty; Hemorrhoid surgery; Back surgery (10/2009); Eye surgery; Coronary angioplasty; Total knee arthroplasty (08/04/2012); and Total knee arthroplasty (08/04/2012).   Current Outpatient Prescriptions on File Prior to Visit  Medication Sig Dispense Refill  . celecoxib (CELEBREX) 200 MG capsule Take 200 mg by mouth 2 (two) times daily.        . clonazePAM (KLONOPIN) 1 MG tablet Take 1 tablet (1 mg total) by mouth at bedtime. For sleep  30 tablet  5  . gabapentin (NEURONTIN) 300 MG capsule Take 300 mg by mouth 4 (four) times daily.      . methocarbamol (ROBAXIN) 500 MG tablet Take 1 tablet (500 mg total) by mouth 4 (four) times daily. Prn spasm  60 tablet  0  . oxyCODONE-acetaminophen (ROXICET) 5-325 MG per tablet 1-2 tabs po q4-6hrs prn pain  90 tablet  0  . rOPINIRole (REQUIP) 1 MG tablet Take 2 mg by mouth at bedtime.       No current facility-administered medications on file prior to visit.    No Known Allergies   Physical Exam:  General - No  distress ENT - No sinus tenderness, no oral exudate, no LAN Cardiac - s1s2 regular, no murmur Chest - No wheeze/rales/dullness Back - No focal tenderness Abd - Soft, non-tender Ext - No edema Neuro - Normal strength Skin - No rashes Psych - Normal mood, and behavior   Assessment/Plan:  Coralyn Helling, MD [facility] Pager:  [phone] 01/30/2013, 1:44 PM

## 2013-03-20 ENCOUNTER — Other Ambulatory Visit (HOSPITAL_COMMUNITY): Payer: Self-pay | Admitting: Podiatry

## 2013-03-20 ENCOUNTER — Telehealth (HOSPITAL_COMMUNITY): Payer: Self-pay | Admitting: Podiatry

## 2013-03-20 DIAGNOSIS — R252 Cramp and spasm: Secondary | ICD-10-CM

## 2013-03-20 DIAGNOSIS — R0989 Other specified symptoms and signs involving the circulatory and respiratory systems: Secondary | ICD-10-CM

## 2013-03-21 ENCOUNTER — Encounter: Payer: Self-pay | Admitting: Podiatry

## 2013-03-27 ENCOUNTER — Ambulatory Visit (HOSPITAL_COMMUNITY)
Admission: RE | Admit: 2013-03-27 | Discharge: 2013-03-27 | Disposition: A | Discharge status: Home / Self Care | Payer: Medicare Other | Source: Ambulatory Visit | Attending: Cardiovascular Disease | Admitting: Cardiovascular Disease

## 2013-03-27 DIAGNOSIS — R0989 Other specified symptoms and signs involving the circulatory and respiratory systems: Secondary | ICD-10-CM

## 2013-03-27 DIAGNOSIS — R252 Cramp and spasm: Secondary | ICD-10-CM | POA: Insufficient documentation

## 2013-03-27 NOTE — Progress Notes (Signed)
Lower Extremity Arterial Duplex Completed. Negative. °Brianna L Mazza ° °

## 2013-04-12 ENCOUNTER — Telehealth: Payer: Self-pay | Admitting: Pulmonary Disease

## 2013-04-12 NOTE — Telephone Encounter (Signed)
Per CY-try 1 1/2 of Clonazepam tablets=1.5mg  dose.

## 2013-04-12 NOTE — Telephone Encounter (Signed)
Called, spoke with pt.  He was last seen by VS on 01/30/13 with the following recs:  Patient Instructions    Call if you need to have klonopin dose increased  Follow up in 6 months   ------  1.  Pt states he is taking klonopin 1 mg qhs.  It is helping but not doing the job.  Reports he is sleeping but not well.  He is requesting this be increased per Vs's recs.  Pt states Dr. Craige Cotta did not want him on xanax.  Pt is requesting response before VS comes back next wk.  If not, he will "just take 2 of the 1 mg tablets."  Not sure this would be safe for pt.  Therefore, will route msg to the doc of the day to advise further.  Dr. Maple Hudson, can you pls give some guidance until VS returns next Thursday?  Thank you. 2.  Pt states a company, doesn't know the name as he is not at home, is faxing over a rx for cpap supplies.  Wants to make sure we receive this.  [name], pls advise if you have seen this.  Thank you.

## 2013-04-12 NOTE — Telephone Encounter (Signed)
Aware of recs per CY.

## 2013-04-13 NOTE — Telephone Encounter (Signed)
I do not have a order for anything for this patient. Called his DME Co which is [employer] in ref to this pt request, they do not have request from this patient, they will call patient to see what he needs. Kandice Hams

## 2013-04-18 ENCOUNTER — Telehealth: Payer: Self-pay | Admitting: Pulmonary Disease

## 2013-04-18 NOTE — Telephone Encounter (Signed)
[name] handles our CMN's. Will forward to her to see if was received. Please advise thanks

## 2013-04-20 NOTE — Telephone Encounter (Signed)
I do not have a cmn for this patient from this company.  Tried to call several times just rings a fast busy signal. .[name] [name]

## 2013-04-23 ENCOUNTER — Ambulatory Visit (INDEPENDENT_AMBULATORY_CARE_PROVIDER_SITE_OTHER): Payer: Medicare Other | Admitting: Cardiovascular Disease

## 2013-04-23 ENCOUNTER — Encounter: Payer: Self-pay | Admitting: Cardiovascular Disease

## 2013-04-23 VITALS — BP 138/84 | HR 74 | Ht 72.0 in | Wt 306.9 lb

## 2013-04-23 DIAGNOSIS — R600 Localized edema: Secondary | ICD-10-CM | POA: Insufficient documentation

## 2013-04-23 DIAGNOSIS — R609 Edema, unspecified: Secondary | ICD-10-CM

## 2013-04-23 NOTE — Assessment & Plan Note (Signed)
The patient complains of bilateral lower extremity edema and heaviness with ambulation. Arterial Dopplers performed in our office 03/27/13 were entirely normal. I'm going to get venous reflux studies as well as 2-D echo for LV function I will see him back after that followup.

## 2013-04-23 NOTE — Progress Notes (Signed)
04/23/2013 [name] [name]   [birth year]  [id number]  Primary Physician Fredirick Maudlin, MD Primary Cardiologist: Runell Gess MD Roseanne Reno   HPI:  2 stool is a 72-year-old moderately overweight Caucasian male father of 2 daughters, grandfather 2 grandchildren referred by [facility] for evaluation of claudication in lower extremity edema. Representative bowel is remarkable for a 50-pack-year history of tobacco abuse currently smoking one pack per day. Otherwise she is a diabetic, hypertensive or hyperlipidemic. There is no family history for heart disease. He's never had a heart attack or stroke and denies chest pain or shortness of breath. He did have a stent placed as part of her decade ago but has not had any problems with this since. 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 He complains of heaviness in his legs when he walks as well as pain all the time lower extremity edema. Orthotopic from the office on 03/27/13 were entirely normal.   Current Outpatient Prescriptions  Medication Sig Dispense Refill  . celecoxib (CELEBREX) 200 MG capsule Take 200 mg by mouth 2 (two) times daily.        . ciprofloxacin (CIPRO) 250 MG tablet Take 1 tablet by mouth 2 (two) times daily.      . clonazePAM (KLONOPIN) 1 MG tablet Take 1 tablet (1 mg total) by mouth at bedtime. For sleep  30 tablet  5  . gabapentin (NEURONTIN) 300 MG capsule Take 300 mg by mouth as needed.       Marland Kitchen HYDROcodone-acetaminophen (NORCO) 10-325 MG per tablet Take 1 tablet by mouth 4 (four) times daily as needed.      . methocarbamol (ROBAXIN) 500 MG tablet Take 500 mg by mouth 2 (two) times daily as needed. Prn spasm      . primidone (MYSOLINE) 50 MG tablet Take 1 tablet by mouth daily.      Marland Kitchen rOPINIRole (REQUIP) 1 MG tablet Take 2 mg by mouth at bedtime.      Marland Kitchen tiZANidine (ZANAFLEX) 4 MG tablet Take 1 tablet by mouth as needed.      . VOLTAREN 1 % GEL Apply 1  application topically 2 (two) times daily as needed.       No current facility-administered medications for this visit.    No Known Allergies  History   Social History  . Marital Status: Widowed    Spouse Name: N/A    Number of Children: 2  . Years of Education: N/A   Occupational History  . Disabled, Retired Curator   . RETIRED    Social History Main Topics  . Smoking status: Current Every Day Smoker -- 1.00 packs/day for 55 years    Types: Cigarettes  . Smokeless tobacco: Never Used     Comment: <1 ppd  . Alcohol Use: No  . Drug Use: No  . Sexual Activity: Not on file   Other Topics Concern  . Not on file   Social History Narrative  . No narrative on file     Review of Systems: General: negative for chills, fever, night sweats or weight changes.  Cardiovascular: negative for chest pain, dyspnea on exertion, edema, orthopnea, palpitations, paroxysmal nocturnal dyspnea or shortness of breath Dermatological: negative for rash Respiratory: negative for cough or wheezing Urologic: negative for hematuria Abdominal: negative for nausea, vomiting, diarrhea, bright red blood per rectum, melena, or hematemesis Neurologic: negative for visual changes, syncope, or dizziness All other systems reviewed and are otherwise negative  except as noted above.    Blood pressure 138/84, pulse 74, height 6' (1.829 m), weight 306 lb 14.4 oz (139.209 kg).  General appearance: alert and no distress Neck: no adenopathy, no carotid bruit, no JVD, supple, symmetrical, trachea midline and thyroid not enlarged, symmetric, no tenderness/mass/nodules Lungs: clear to auscultation bilaterally Heart: regular rate and rhythm, S1, S2 normal, no murmur, click, rub or gallop Abdomen: soft, non-tender; bowel sounds normal; no masses,  no organomegaly Extremities: 1-2+ bilateral lower extremity edema Pulses: 2+ and symmetric  EKG not performed today  ASSESSMENT AND PLAN:   Lower extremity  edema The patient complains of bilateral lower extremity edema and heaviness with ambulation. Arterial Dopplers performed in our office 03/27/13 were entirely normal. I'm going to get venous reflux studies as well as 2-D echo for LV function I will see him back after that followup.      Runell Gess MD FACP,FACC,FAHA, [facility] 04/23/2013 11:28 AM

## 2013-04-23 NOTE — Patient Instructions (Signed)
  We will see you back in follow up after the tests  Dr Allyson Sabal has ordered an echocardiogram and venous reflux dopplers

## 2013-04-24 ENCOUNTER — Telehealth: Payer: Self-pay | Admitting: Pulmonary Disease

## 2013-04-24 MED ORDER — CLONAZEPAM 1 MG PO TABS: 1.5000 mg | ORAL_TABLET | Freq: Every day | ORAL | Status: DC

## 2013-04-24 NOTE — Telephone Encounter (Signed)
Spoke with the pt He states approx 1 wk ago had to increase clonazepam 1 mg to 1 1/2 tablets at bedtime He had called the on call doc when he noticed he was having trouble falling asleep and staying asleep  The doc on call rec he take the 1 1/2 tablets, and this has helped some He falls asleep easy, but wakes up after approx 5 hrs and has trouble going back to sleep  He is now out of med since he had to increase  Wants to know how to proceed Please advise thanks!

## 2013-04-24 NOTE — Telephone Encounter (Signed)
Please send script for klonopin 1 mg pill, he is to take 1.5 mg qhs, dispense 45 pills with 3 refills.

## 2013-04-24 NOTE — Telephone Encounter (Signed)
Refill sent and pt is aware. Jennifer Castillo, CMA  

## 2013-04-25 ENCOUNTER — Telehealth: Payer: Self-pay | Admitting: Pulmonary Disease

## 2013-04-25 NOTE — Telephone Encounter (Signed)
I spoke with amber and advised her VS has not been in to sign CMN's. She voiced her understanding and needed nothing further

## 2013-04-26 ENCOUNTER — Telehealth: Payer: Self-pay | Admitting: Pulmonary Disease

## 2013-04-26 NOTE — Telephone Encounter (Signed)
I spoke with [facility]. I looked through VS CMNs and it is not there. I called and they will refax this to upfront fax. Nothing further needed

## 2013-04-30 ENCOUNTER — Other Ambulatory Visit: Payer: Self-pay | Admitting: Pulmonary Disease

## 2013-05-01 ENCOUNTER — Encounter: Payer: Self-pay | Admitting: *Deleted

## 2013-05-01 ENCOUNTER — Ambulatory Visit (HOSPITAL_COMMUNITY)
Admission: RE | Admit: 2013-05-01 | Discharge: 2013-05-01 | Disposition: A | Discharge status: Home / Self Care | Payer: Medicare Other | Source: Ambulatory Visit | Attending: Cardiovascular Disease | Admitting: Cardiovascular Disease

## 2013-05-01 DIAGNOSIS — R609 Edema, unspecified: Secondary | ICD-10-CM | POA: Insufficient documentation

## 2013-05-01 DIAGNOSIS — R600 Localized edema: Secondary | ICD-10-CM

## 2013-05-01 DIAGNOSIS — F17200 Nicotine dependence, unspecified, uncomplicated: Secondary | ICD-10-CM | POA: Insufficient documentation

## 2013-05-01 NOTE — Progress Notes (Signed)
2D Echo Performed 05/01/2013    Quaneisha Hanisch, RCS  

## 2013-05-11 ENCOUNTER — Ambulatory Visit (HOSPITAL_COMMUNITY)
Admission: RE | Admit: 2013-05-11 | Discharge: 2013-05-11 | Disposition: A | Discharge status: Home / Self Care | Payer: Medicare Other | Source: Ambulatory Visit | Attending: Cardiovascular Disease | Admitting: Cardiovascular Disease

## 2013-05-11 DIAGNOSIS — R609 Edema, unspecified: Secondary | ICD-10-CM | POA: Insufficient documentation

## 2013-05-11 DIAGNOSIS — R600 Localized edema: Secondary | ICD-10-CM

## 2013-05-11 DIAGNOSIS — M7989 Other specified soft tissue disorders: Secondary | ICD-10-CM

## 2013-05-11 NOTE — Progress Notes (Signed)
Venous Duplex Lower Ext. Completed. Jamela Cumbo, BS, RDMS, RVT  

## 2013-05-23 ENCOUNTER — Ambulatory Visit (INDEPENDENT_AMBULATORY_CARE_PROVIDER_SITE_OTHER): Payer: Medicare Other | Admitting: Cardiovascular Disease

## 2013-05-23 ENCOUNTER — Encounter: Payer: Self-pay | Admitting: Cardiovascular Disease

## 2013-05-23 VITALS — BP 130/80 | HR 76 | Ht 72.0 in | Wt 313.0 lb

## 2013-05-23 DIAGNOSIS — R600 Localized edema: Secondary | ICD-10-CM

## 2013-05-23 DIAGNOSIS — R609 Edema, unspecified: Secondary | ICD-10-CM

## 2013-05-23 DIAGNOSIS — Z79899 Other long term (current) drug therapy: Secondary | ICD-10-CM

## 2013-05-23 MED ORDER — FUROSEMIDE 20 MG PO TABS: 20.0000 mg | ORAL_TABLET | Freq: Every day | ORAL | Status: DC

## 2013-05-23 NOTE — Assessment & Plan Note (Signed)
Venous Dopplers performed 05/11/13 showed deep venous reflux without evidence of thrombus and greater saphenous vein reflux left greater than right. He is sparse and hematocrit on the left. I'm going to start him on a low-dose diuretic. I've offered compression stockings though he said he probably would wear these. I am going to refer him to Dr. [place] Hilty for further venous evaluation.

## 2013-05-23 NOTE — Progress Notes (Signed)
05/23/2013 [name] [name]   07-May-1941  [id number]  Primary Physician [name] Maudlin, MD Primary Cardiologist: Runell Gess MD [name] [name]   HPI:  [name] is a 72 year old moderately overweight Caucasian male father of 2 daughters, grandfather 2 grandchildren referred by Dr[facility] for evaluation of claudication and lower extremity edema. His cardiac risk factor profile is remarkable for a 50-pack-year history of tobacco abuse currently smoking one pack per day. Otherwise he is not diabetic, hypertensive or hyperlipidemic. There is no family history for heart disease. He's never had a heart attack or stroke and denies chest pain or shortness of breath. He did have a stent placed as part of her decade ago but has not had any problems with this since. 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 He complains of heaviness in his legs when he walks as well as pain all the time lower extremity edema. Arterial Doppler studies from our office on 03/27/13 were entirely normal. I saw him initially about a month ago. I think it is Doppler studies that showed deep venous reflux as well as left greater than right greater saphenous vein reflux. In addition, a 2-D echo room revealed normal LV systolic function, moderate centrally directed MR with moderate left atrial dilatation.    Current Outpatient Prescriptions  Medication Sig Dispense Refill  . ANDROGEL PUMP 20.25 MG/ACT (1.62%) GEL       . celecoxib (CELEBREX) 200 MG capsule Take 200 mg by mouth 2 (two) times daily.        . ciprofloxacin (CIPRO) 250 MG tablet Take 1 tablet by mouth 2 (two) times daily.      . clonazePAM (KLONOPIN) 1 MG tablet Take 1.5 tablets (1.5 mg total) by mouth at bedtime. For sleep  45 tablet  3  . gabapentin (NEURONTIN) 300 MG capsule Take 300 mg by mouth as needed.       Marland Kitchen HYDROcodone-acetaminophen (NORCO) 10-325 MG per tablet Take 1 tablet by  mouth 4 (four) times daily as needed.      . methocarbamol (ROBAXIN) 500 MG tablet Take 500 mg by mouth 2 (two) times daily as needed. Prn spasm      . primidone (MYSOLINE) 50 MG tablet Take 1 tablet by mouth daily.      Marland Kitchen rOPINIRole (REQUIP) 1 MG tablet Take 2 mg by mouth at bedtime.      Marland Kitchen rOPINIRole (REQUIP) 1 MG tablet TAKE 2 TABLETS BY MOUTH AT BEDTIME  60 tablet  1  . rOPINIRole (REQUIP) 1 MG tablet TAKE 2 TABLETS BY MOUTH AT BEDTIME  60 tablet  1  . tiZANidine (ZANAFLEX) 4 MG tablet Take 1 tablet by mouth as needed.      . traZODone (DESYREL) 100 MG tablet TAKE 1 TABLET BY MOUTH AT BEDTIME  30 tablet  3  . VOLTAREN 1 % GEL Apply 1 application topically 2 (two) times daily as needed.      . furosemide (LASIX) 20 MG tablet Take 1 tablet (20 mg total) by mouth daily.  30 tablet  6   No current facility-administered medications for this visit.    No Known Allergies  History   Social History  . Marital Status: Widowed    Spouse Name: N/A    Number of Children: 2  . Years of Education: N/A   Occupational History  . Disabled, Retired Curator   . RETIRED    Social History Main Topics  .  Smoking status: Current Every Day Smoker -- 1.00 packs/day for 55 years    Types: Cigarettes  . Smokeless tobacco: Never Used     Comment: <1 ppd  . Alcohol Use: No  . Drug Use: No  . Sexual Activity: Not on file   Other Topics Concern  . Not on file   Social History Narrative  . No narrative on file     Review of Systems: General: negative for chills, fever, night sweats or weight changes.  Cardiovascular: negative for chest pain, dyspnea on exertion, edema, orthopnea, palpitations, paroxysmal nocturnal dyspnea or shortness of breath Dermatological: negative for rash Respiratory: negative for cough or wheezing Urologic: negative for hematuria Abdominal: negative for nausea, vomiting, diarrhea, bright red blood per rectum, melena, or hematemesis Neurologic: negative for visual  changes, syncope, or dizziness All other systems reviewed and are otherwise negative except as noted above.    Blood pressure 130/80, pulse 76, height 6' (1.829 m), weight 313 lb (141.976 kg).  General appearance: alert and no distress Neck: no adenopathy, no carotid bruit, no JVD, supple, symmetrical, trachea midline and thyroid not enlarged, symmetric, no tenderness/mass/nodules Lungs: clear to auscultation bilaterally Heart: regular rate and rhythm, S1, S2 normal, no murmur, click, rub or gallop Extremities: 2-3+ bilateral lower extremity edema with venous stasis changes  EKG not performed today  ASSESSMENT AND PLAN:   Lower extremity edema Venous Dopplers performed 05/11/13 showed deep venous reflux without evidence of thrombus and greater saphenous vein reflux left greater than right. He is sparse and hematocrit on the left. I'm going to start him on a low-dose diuretic. I've offered compression stockings though he said he probably would wear these. I am going to refer him to Dr. Italy Hilty for further venous evaluation.      Runell Gess MD FACP,FACC,FAHA, [facility] 05/23/2013 12:25 PM

## 2013-05-23 NOTE — Patient Instructions (Addendum)
  Your physician wants you to follow-up with him in : 6 months with Dr Allyson Sabal                                            and with Dr Rennis Golden in 6-8 weeks for lower extremity edema                    You will receive a reminder letter in the mail one month in advance. If you don't receive a letter, please call our office to schedule the follow-up appointment.   Your physician recommends that you return for lab work in: 1-2 weeks   Your physician has recommended you make the following change in your medication: start Lasix 20mg  daily

## 2013-06-06 NOTE — Progress Notes (Signed)
Quick Note:  Message forwarded to scheduler ______

## 2013-06-11 NOTE — Progress Notes (Signed)
Called patient with results and informed that patient is a candidate for endovenous ablation. Informed that no clinics are available at this time but that patient could be placed on list to call when a clinic opens up. Patient expressed interest in procedure. Message send to scheduler, Joyce Gross, to place patient's name on call list.

## 2013-06-11 NOTE — Progress Notes (Signed)
Called patient with results and informed that patient is a candidate for endovenous ablation. Informed that no clinics are available at this time but that patient could be placed on list to call when a clinic opens up. Patient expressed interest in procedure. Message send to scheduler, Kay, to place patient's name on call list.  

## 2013-06-21 ENCOUNTER — Encounter: Payer: Self-pay | Admitting: Internal Medicine

## 2013-06-21 ENCOUNTER — Ambulatory Visit (INDEPENDENT_AMBULATORY_CARE_PROVIDER_SITE_OTHER): Payer: Medicare Other | Admitting: Internal Medicine

## 2013-06-21 VITALS — BP 154/98 | HR 56 | Ht 72.0 in | Wt 313.4 lb

## 2013-06-21 DIAGNOSIS — I8390 Asymptomatic varicose veins of unspecified lower extremity: Secondary | ICD-10-CM | POA: Insufficient documentation

## 2013-06-21 DIAGNOSIS — R609 Edema, unspecified: Secondary | ICD-10-CM

## 2013-06-21 DIAGNOSIS — R600 Localized edema: Secondary | ICD-10-CM

## 2013-06-21 DIAGNOSIS — G4733 Obstructive sleep apnea (adult) (pediatric): Secondary | ICD-10-CM

## 2013-06-21 DIAGNOSIS — Z79899 Other long term (current) drug therapy: Secondary | ICD-10-CM

## 2013-06-21 DIAGNOSIS — I83893 Varicose veins of bilateral lower extremities with other complications: Secondary | ICD-10-CM

## 2013-06-21 DIAGNOSIS — F17200 Nicotine dependence, unspecified, uncomplicated: Secondary | ICD-10-CM

## 2013-06-21 DIAGNOSIS — R0602 Shortness of breath: Secondary | ICD-10-CM

## 2013-06-21 DIAGNOSIS — I2510 Atherosclerotic heart disease of native coronary artery without angina pectoris: Secondary | ICD-10-CM

## 2013-06-21 NOTE — Patient Instructions (Addendum)
We will have our schedulers call you to set up an appointment whenever we have another venous ablation clinic date.  You will need to come in to sign a consent form prior and we will give you instructions at that time.   Please have blood work done at your earliest convenience.

## 2013-06-21 NOTE — Progress Notes (Signed)
OFFICE NOTE  Chief Complaint:  Follow-up venous reflux study  Primary Care Physician: Fredirick Maudlin, MD  HPI:  [name] [name] is a 72 year old moderately overweight Caucasian male father of 2 daughters, grandfather 2 grandchildren referred by [facility] but for evaluation of claudication in lower extremity edema. Past medical history is significant for a 50-pack-year history of tobacco abuse, currently smoking one pack per day. Otherwise he is not diabetic, hypertensive or hyperlipidemic. There is no family history for heart disease. He's never had a heart attack or stroke and denies chest pain or shortness of breath. He did have a stent placed a decade ago but has not had any problems with this since. 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 He complains of heaviness in his legs when he walks as well as pain all the time lower extremity edema. Orthostatics from the office on 03/27/13 were entirely normal.  The septal underwent bilateral venous reflux studies which indicated significant bilateral deep vein reflux as well as reflux of the bilateral greater saphenous veins. The left greater saphenous vein had a significant tributary vessel became off just above the left knee.  He reports bilateral leg heaviness, restlessness, achiness, significant swelling to the point where he cannot hardly get on pants.  He does have a history of obstructive sleep apnea and is wearing CPAP. He had a recent echocardiogram which showed an EF of 55-60%, however there is a mildly dilated right ventricle and right atrium with normal right heart pressures. There was mild RV dysfunction.  There is no known family history of varicose veins. He denies any superficial phlebitis or history of deep vein thrombosis.  PMHx:  Past Medical History  Diagnosis Date  . Hypertension   . Hyperlipidemia   . Rhinitis   . Peripheral edema   . Osteoarthritis   . Obesity     morbid  .  CAD (coronary artery disease)     s/p angioplasty  . ED (erectile dysfunction)   . OSA (obstructive sleep apnea)     severe, on CPAP 12  . Restless leg syndrome   . Insomnia   . Anxiety   . Bilateral lower extremity edema   . Tobacco abuse     Past Surgical History  Procedure Laterality Date  . Total knee arthroplasty      left  . Hemorrhoid surgery    . Back surgery  10/2009  . Eye surgery      lazer bil  . Coronary angioplasty    . Total knee arthroplasty  08/04/2012    right knee  . Total knee arthroplasty  08/04/2012    Procedure: TOTAL KNEE ARTHROPLASTY;  Surgeon: Thera Flake., MD;  Location: [facility] OR;  Service: Orthopedics;  Laterality: Right;  WITH PATELLA RESURFACING    FAMHx:  Family History  Problem Relation Age of Onset  . Coronary artery disease Neg Hx     SOCHx:   reports that he has been smoking Cigarettes.  He has a 55 pack-year smoking history. He has never used smokeless tobacco. He reports that he does not drink alcohol or use illicit drugs.  ALLERGIES:  No Known Allergies  ROS: A comprehensive review of systems was negative except for: Cardiovascular: positive for lower extremity edema and venous stasis changes  HOME MEDS: Current Outpatient Prescriptions  Medication Sig Dispense Refill  . ANDROGEL PUMP 20.25 MG/ACT (1.62%) GEL       . celecoxib (CELEBREX) 200  MG capsule Take 200 mg by mouth 2 (two) times daily.        . ciprofloxacin (CIPRO) 250 MG tablet Take 1 tablet by mouth 2 (two) times daily.      . clonazePAM (KLONOPIN) 1 MG tablet Take 1.5 tablets (1.5 mg total) by mouth at bedtime. For sleep  45 tablet  3  . furosemide (LASIX) 20 MG tablet Take 1 tablet (20 mg total) by mouth daily.  30 tablet  6  . gabapentin (NEURONTIN) 300 MG capsule Take 300 mg by mouth as needed.       Marland Kitchen HYDROcodone-acetaminophen (NORCO) 10-325 MG per tablet Take 1 tablet by mouth 4 (four) times daily as needed.      . methocarbamol (ROBAXIN) 500 MG tablet Take 500  mg by mouth 2 (two) times daily as needed. Prn spasm      . primidone (MYSOLINE) 50 MG tablet Take 1 tablet by mouth daily.      Marland Kitchen rOPINIRole (REQUIP) 1 MG tablet TAKE 2 TABLETS BY MOUTH AT BEDTIME  60 tablet  1  . tiZANidine (ZANAFLEX) 4 MG tablet Take 1 tablet by mouth as needed.      . traZODone (DESYREL) 100 MG tablet TAKE 1 TABLET BY MOUTH AT BEDTIME  30 tablet  3  . VOLTAREN 1 % GEL Apply 1 application topically 2 (two) times daily as needed.       No current facility-administered medications for this visit.    LABS/IMAGING: No results found for this or any previous visit (from the past 48 hour(s)). No results found.  VITALS: BP 154/98  Pulse 56  Ht 6' (1.829 m)  Wt 313 lb 6.4 oz (142.157 kg)  BMI 42.5 kg/m2  EXAM: Extremities: edema 3+ firm edema bilaterally, varicose veins noted, venous stasis dermatitis noted and CEAP 1,2,3,4a Pulses: 2+ and symmetric Skin: hemosiderin changes bilaterally  EKG: deferred  ASSESSMENT: 1. 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 Bilateral deep vein reflux 3. 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 Obstructive sleep apnea on CPAP 5. Mild RV dysfunction 6. Morbid obesity 7. Tobacco dependent  PLAN: 1.   [name] has a number of medical problems and lower sternum the deep and superficial vein reflux related to obesity, smoking, upper airway resistance and obstructive sleep apnea. He is significant lower extremity swelling and his Dopplers do indicate both superficial and deep vein reflux. I had along discussion with him about management possibilities today. He is currently wearing stockings but he says that there almost impossible to get on due to the size of his legs. He is superficial veins are potentially amenable to ablation, however given significant deep vein reflux, a complete cure is unlikely. This is especially true given his body habitus, abdominal birth and ongoing tobacco abuse. Despite that, he may  derive some benefit from superficial vein ablation. I discussed the procedure in the office today and we will be in contact with him in the near future if he should decide to undergo the procedure.  Chrystie Nose, MD, [facility] Attending Cardiologist [facility]  HILTY,Kenneth C 06/21/2013, 5:10 PM

## 2013-07-06 ENCOUNTER — Ambulatory Visit: Payer: Medicare Other | Admitting: Internal Medicine

## 2013-07-09 ENCOUNTER — Telehealth: Payer: Self-pay | Admitting: Pulmonary Disease

## 2013-07-09 NOTE — Telephone Encounter (Signed)
Spoke to pt. States that he will need his Klonopin filled earlier than usual due to him leaving for the beach on 07/12/13. I have contacted his pharmacy, he got this medication last filled on 06/15/13. Gave a verbal ok to fill this early, I spoke with Kristen at [facility]. Pt is aware.

## 2013-07-16 ENCOUNTER — Other Ambulatory Visit: Payer: Self-pay | Admitting: *Deleted

## 2013-07-16 MED ORDER — FUROSEMIDE 20 MG PO TABS: 20.0000 mg | ORAL_TABLET | Freq: Every day | ORAL | Status: DC

## 2013-07-16 NOTE — Telephone Encounter (Signed)
Rx was sent to pharmacy electronically. 

## 2013-07-17 ENCOUNTER — Other Ambulatory Visit: Payer: Self-pay | Admitting: *Deleted

## 2013-07-17 MED ORDER — TRAZODONE HCL 100 MG PO TABS: 100.0000 mg | ORAL_TABLET | Freq: Every day | ORAL | Status: DC

## 2013-07-24 ENCOUNTER — Other Ambulatory Visit: Payer: Self-pay | Admitting: Pulmonary Disease

## 2013-07-31 ENCOUNTER — Other Ambulatory Visit: Payer: Self-pay | Admitting: Pulmonary Disease

## 2013-08-01 ENCOUNTER — Ambulatory Visit (INDEPENDENT_AMBULATORY_CARE_PROVIDER_SITE_OTHER): Payer: Medicare Other | Admitting: Pulmonary Disease

## 2013-08-01 ENCOUNTER — Encounter: Payer: Self-pay | Admitting: Pulmonary Disease

## 2013-08-01 VITALS — BP 142/90 | HR 68 | Ht 71.5 in | Wt 295.0 lb

## 2013-08-01 DIAGNOSIS — G2581 Restless legs syndrome: Secondary | ICD-10-CM

## 2013-08-01 DIAGNOSIS — F17200 Nicotine dependence, unspecified, uncomplicated: Secondary | ICD-10-CM

## 2013-08-01 DIAGNOSIS — G4733 Obstructive sleep apnea (adult) (pediatric): Secondary | ICD-10-CM

## 2013-08-01 DIAGNOSIS — J42 Unspecified chronic bronchitis: Secondary | ICD-10-CM | POA: Insufficient documentation

## 2013-08-01 DIAGNOSIS — G4700 Insomnia, unspecified: Secondary | ICD-10-CM

## 2013-08-01 MED ORDER — CLONAZEPAM 1 MG PO TABS: 1.5000 mg | ORAL_TABLET | Freq: Every day | ORAL | Status: DC

## 2013-08-01 NOTE — Assessment & Plan Note (Signed)
Discussed importance of smoking cessation. 

## 2013-08-01 NOTE — Assessment & Plan Note (Signed)
Continue requip 1 mg qhs.

## 2013-08-01 NOTE — Progress Notes (Signed)
  Chief Complaint  Patient presents with  . Sleep Apnea    using CPAP machine every night. denies problems with machine, mask or pressure.    History of Present Illness: [name] [name] is a 72 y.o. male smoker with severe OSA, insomnia, and RLS.  He continues to smoke 1 pack per day.  He has cough with clear sputum.  He has been on lasix for leg swelling.  He goes to bed at 11 pm.  He takes klonopin and requip at 10 pm.  He sleeps through the night.  He wakes up at 8 am.  He uses his CPAP every night.  He has full face mask, and this fits well.   Tests: PSG 11/03/05 >> AHI 93.1, SpO2 low 72% CXR 07/28/12 >> chronic bronchitis changes Echo 05/01/13 >> mild LVH, EF 55 to 60%, mod MR, mod LA dilation, mild RV dilation  He  has a past medical history of Hypertension; Hyperlipidemia; Rhinitis; Peripheral edema; Osteoarthritis; Obesity; CAD (coronary artery disease); ED (erectile dysfunction); OSA (obstructive sleep apnea); Restless leg syndrome; Insomnia; Anxiety; Bilateral lower extremity edema; and Tobacco abuse.  He  has past surgical history that includes Total knee arthroplasty; Hemorrhoid surgery; Back surgery (10/2009); Eye surgery; Coronary angioplasty; Total knee arthroplasty (08/04/2012); and Total knee arthroplasty (08/04/2012).   Current Outpatient Prescriptions on File Prior to Visit  Medication Sig Dispense Refill  . ANDROGEL PUMP 20.25 MG/ACT (1.62%) GEL       . celecoxib (CELEBREX) 200 MG capsule Take 200 mg by mouth 2 (two) times daily.        . clonazePAM (KLONOPIN) 1 MG tablet Take 1.5 tablets (1.5 mg total) by mouth at bedtime. For sleep  45 tablet  3  . furosemide (LASIX) 20 MG tablet Take 1 tablet (20 mg total) by mouth daily.  90 tablet  2  . gabapentin (NEURONTIN) 300 MG capsule Take 300 mg by mouth as needed.       Marland Kitchen HYDROcodone-acetaminophen (NORCO) 10-325 MG per tablet Take 1 tablet by mouth 4 (four) times daily as needed.      . methocarbamol (ROBAXIN) 500 MG  tablet Take 500 mg by mouth 2 (two) times daily as needed. Prn spasm      . primidone (MYSOLINE) 50 MG tablet Take 1 tablet by mouth daily.      Marland Kitchen rOPINIRole (REQUIP) 1 MG tablet TAKE 2 TABLETS BY MOUTH AT BEDTIME  60 tablet  1  . tiZANidine (ZANAFLEX) 4 MG tablet Take 1 tablet by mouth as needed.      . traZODone (DESYREL) 100 MG tablet Take 1 tablet (100 mg total) by mouth at bedtime.  90 tablet  0  . VOLTAREN 1 % GEL Apply 1 application topically 2 (two) times daily as needed.       No current facility-administered medications on file prior to visit.    No Known Allergies   Physical Exam:  General - No distress ENT - No sinus tenderness, MP 3, no oral exudate, no LAN Cardiac - s1s2 regular, 2/6 SM Chest - b/l rhonchi Back - No focal tenderness Abd - Soft, non-tender Ext - 2+ edema Neuro - Normal strength Skin - No rashes Psych - Normal mood, and behavior   Assessment/Plan:  Coralyn Helling, MD [place] [facility] Pager:  [phone] 08/01/2013, 3:47 PM

## 2013-08-01 NOTE — Assessment & Plan Note (Signed)
He reports compliance with therapy and improvement with CPAP.  Will get his CPAP download.  Will arrange for overnight oximetry with CPAP and room air.

## 2013-08-01 NOTE — Assessment & Plan Note (Signed)
He is to continue klonopin 1.5 mg qhs.

## 2013-08-01 NOTE — Patient Instructions (Signed)
Will arrange for breathing test (PFT) Will get overnight oxygen test and report from CPAP download Will call with test results Follow up in 6 months

## 2013-08-01 NOTE — Assessment & Plan Note (Signed)
Will arrange for PFT to further assess.

## 2013-08-28 ENCOUNTER — Telehealth: Payer: Self-pay | Admitting: Pulmonary Disease

## 2013-08-28 NOTE — Telephone Encounter (Signed)
CPAP 07/09/13 to 08/07/13 >> used on 30 of 30 nights with average 4 hrs 1 min.  Average AHI 1.2 with CPAP 12 cm H2O.  ONO with CPAP and RA 08/08/13 >> test time 8 hrs 49 min.  Mean SpO2 93%, low SpO2 86%.  Spent 5 min with SpO2 < 88%.  Will have my nurse inform pt that CPAP report and ONO look good on current set up.  He needs to ensure that he uses his CPAP for the entire time he is asleep.

## 2013-08-29 NOTE — Telephone Encounter (Signed)
Pt is aware of CPAP and ONO results.

## 2013-08-30 HISTORY — PX: PORTACATH PLACEMENT: SHX2246

## 2013-09-03 ENCOUNTER — Ambulatory Visit (INDEPENDENT_AMBULATORY_CARE_PROVIDER_SITE_OTHER): Payer: Medicare Other | Admitting: Pulmonary Disease

## 2013-09-03 DIAGNOSIS — J42 Unspecified chronic bronchitis: Secondary | ICD-10-CM

## 2013-09-03 NOTE — Progress Notes (Signed)
PFT done today. 

## 2013-09-07 ENCOUNTER — Telehealth: Payer: Self-pay | Admitting: Pulmonary Disease

## 2013-09-07 NOTE — Telephone Encounter (Signed)
Pt is aware of PFT results.

## 2013-09-07 NOTE — Telephone Encounter (Signed)
PFT 09/03/13 >> FEV1 2.19 (70%), FEV1% 71, TLC 6.08 (89%), DLCO 69%, no BD.  Will have my nurse inform pt that PFT shows borderline changes for COPD and emphysema.  He needs to work on smoking cessation to prevent this from getting worse.

## 2013-10-15 ENCOUNTER — Other Ambulatory Visit: Payer: Self-pay | Admitting: Pulmonary Disease

## 2013-10-21 ENCOUNTER — Other Ambulatory Visit: Payer: Self-pay | Admitting: Pulmonary Disease

## 2013-10-23 ENCOUNTER — Other Ambulatory Visit: Payer: Self-pay | Admitting: Pulmonary Disease

## 2013-11-12 ENCOUNTER — Other Ambulatory Visit: Payer: Self-pay | Admitting: Pulmonary Disease

## 2013-11-13 NOTE — Telephone Encounter (Signed)
Please advise on refill. Thanks. 

## 2013-11-14 ENCOUNTER — Other Ambulatory Visit: Payer: Self-pay | Admitting: Pulmonary Disease

## 2013-12-12 ENCOUNTER — Other Ambulatory Visit (HOSPITAL_COMMUNITY): Payer: Self-pay | Admitting: Pulmonary Disease

## 2013-12-12 DIAGNOSIS — M545 Low back pain, unspecified: Secondary | ICD-10-CM

## 2013-12-19 ENCOUNTER — Other Ambulatory Visit: Payer: Self-pay | Admitting: Pulmonary Disease

## 2013-12-20 ENCOUNTER — Ambulatory Visit (HOSPITAL_COMMUNITY): Admission: RE | Admit: 2013-12-20 | Payer: Medicare Other | Source: Ambulatory Visit

## 2013-12-20 ENCOUNTER — Other Ambulatory Visit (HOSPITAL_COMMUNITY): Payer: Self-pay | Admitting: Pulmonary Disease

## 2013-12-20 DIAGNOSIS — M545 Low back pain, unspecified: Secondary | ICD-10-CM

## 2014-01-03 ENCOUNTER — Ambulatory Visit (HOSPITAL_COMMUNITY)
Admission: RE | Admit: 2014-01-03 | Discharge: 2014-01-03 | Disposition: A | Discharge status: Home / Self Care | Payer: Medicare Other | Source: Ambulatory Visit | Attending: Pulmonary Disease | Admitting: Pulmonary Disease

## 2014-01-03 DIAGNOSIS — M47817 Spondylosis without myelopathy or radiculopathy, lumbosacral region: Secondary | ICD-10-CM | POA: Insufficient documentation

## 2014-01-03 DIAGNOSIS — R599 Enlarged lymph nodes, unspecified: Secondary | ICD-10-CM | POA: Insufficient documentation

## 2014-01-03 DIAGNOSIS — M545 Low back pain, unspecified: Secondary | ICD-10-CM | POA: Insufficient documentation

## 2014-01-03 DIAGNOSIS — M5126 Other intervertebral disc displacement, lumbar region: Secondary | ICD-10-CM | POA: Insufficient documentation

## 2014-01-03 DIAGNOSIS — IMO0002 Reserved for concepts with insufficient information to code with codable children: Secondary | ICD-10-CM | POA: Insufficient documentation

## 2014-01-03 DIAGNOSIS — M5137 Other intervertebral disc degeneration, lumbosacral region: Secondary | ICD-10-CM | POA: Insufficient documentation

## 2014-01-03 DIAGNOSIS — M51379 Other intervertebral disc degeneration, lumbosacral region without mention of lumbar back pain or lower extremity pain: Secondary | ICD-10-CM | POA: Insufficient documentation

## 2014-01-04 ENCOUNTER — Other Ambulatory Visit (HOSPITAL_COMMUNITY): Payer: Self-pay | Admitting: Pulmonary Disease

## 2014-01-04 DIAGNOSIS — C8589 Other specified types of non-Hodgkin lymphoma, extranodal and solid organ sites: Secondary | ICD-10-CM

## 2014-01-09 ENCOUNTER — Telehealth: Payer: Self-pay | Admitting: Pulmonary Disease

## 2014-01-09 MED ORDER — CLONAZEPAM 1 MG PO TABS: ORAL_TABLET | ORAL | Status: DC

## 2014-01-09 NOTE — Telephone Encounter (Signed)
Okay to call in refill.  Please call in klonopin 1 mg pills, he is to take 1.5 mg qhs, dispense 45 pills with 3 refills.

## 2014-01-09 NOTE — Telephone Encounter (Signed)
Spoke with [name] [name] at [address] Phoned in [place] 1mg  tab Take 1.5mg  qhs #45 x 3 refills.  Pt aware

## 2014-01-09 NOTE — Telephone Encounter (Signed)
Spoke with pt. HIs clonopin will be due for refill on Saturday and that is when he will run out. He is leaving going out of town and the pharm will not okay an early refill unless we give them a call. Pt is asking for [place] to do this since he will run out and will be out of town to get his refill. Please advise Dr. Halford Chessman if okay to do so? thanks

## 2014-01-09 NOTE — Telephone Encounter (Signed)
lmomtcb x1 

## 2014-01-13 ENCOUNTER — Other Ambulatory Visit: Payer: Self-pay | Admitting: Pulmonary Disease

## 2014-01-16 ENCOUNTER — Other Ambulatory Visit (HOSPITAL_COMMUNITY): Payer: Self-pay | Admitting: Pulmonary Disease

## 2014-01-16 DIAGNOSIS — C8590 Non-Hodgkin lymphoma, unspecified, unspecified site: Secondary | ICD-10-CM

## 2014-01-17 ENCOUNTER — Encounter (HOSPITAL_COMMUNITY): Payer: Medicare Other

## 2014-01-17 ENCOUNTER — Other Ambulatory Visit (HOSPITAL_COMMUNITY): Payer: Self-pay | Admitting: Pulmonary Disease

## 2014-01-17 DIAGNOSIS — C8590 Non-Hodgkin lymphoma, unspecified, unspecified site: Secondary | ICD-10-CM

## 2014-01-18 ENCOUNTER — Ambulatory Visit (HOSPITAL_COMMUNITY)
Admission: RE | Admit: 2014-01-18 | Discharge: 2014-01-18 | Disposition: A | Discharge status: Home / Self Care | Payer: Medicare Other | Source: Ambulatory Visit | Attending: Pulmonary Disease | Admitting: Pulmonary Disease

## 2014-01-18 DIAGNOSIS — R935 Abnormal findings on diagnostic imaging of other abdominal regions, including retroperitoneum: Secondary | ICD-10-CM | POA: Insufficient documentation

## 2014-01-18 DIAGNOSIS — K429 Umbilical hernia without obstruction or gangrene: Secondary | ICD-10-CM | POA: Insufficient documentation

## 2014-01-18 DIAGNOSIS — C8590 Non-Hodgkin lymphoma, unspecified, unspecified site: Secondary | ICD-10-CM

## 2014-01-18 DIAGNOSIS — Z87891 Personal history of nicotine dependence: Secondary | ICD-10-CM | POA: Insufficient documentation

## 2014-01-18 DIAGNOSIS — I700 Atherosclerosis of aorta: Secondary | ICD-10-CM | POA: Insufficient documentation

## 2014-01-18 DIAGNOSIS — K8020 Calculus of gallbladder without cholecystitis without obstruction: Secondary | ICD-10-CM | POA: Insufficient documentation

## 2014-01-18 DIAGNOSIS — R161 Splenomegaly, not elsewhere classified: Secondary | ICD-10-CM | POA: Insufficient documentation

## 2014-01-18 DIAGNOSIS — R599 Enlarged lymph nodes, unspecified: Secondary | ICD-10-CM | POA: Insufficient documentation

## 2014-01-18 DIAGNOSIS — Z87898 Personal history of other specified conditions: Secondary | ICD-10-CM | POA: Insufficient documentation

## 2014-01-18 LAB — BUN: BUN: 22 mg/dL (ref 6–23)

## 2014-01-18 LAB — CREATININE, SERUM
CREATININE: 1.05 mg/dL (ref 0.50–1.35)
GFR calc Af Amer: 80 mL/min — ABNORMAL LOW (ref >90)
GFR calc non Af Amer: 69 mL/min — ABNORMAL LOW (ref >90)

## 2014-01-18 MED ORDER — IOHEXOL 300 MG/ML  SOLN
100.0000 mL | Freq: Once | INTRAMUSCULAR | Status: AC | PRN
  Administered 2014-01-18: 100 mL via INTRAVENOUS

## 2014-01-23 ENCOUNTER — Other Ambulatory Visit (HOSPITAL_COMMUNITY): Payer: Self-pay | Admitting: Pulmonary Disease

## 2014-01-23 DIAGNOSIS — C8590 Non-Hodgkin lymphoma, unspecified, unspecified site: Secondary | ICD-10-CM

## 2014-01-25 ENCOUNTER — Other Ambulatory Visit: Payer: Self-pay | Admitting: Radiology

## 2014-01-25 ENCOUNTER — Encounter (HOSPITAL_COMMUNITY): Payer: Self-pay | Admitting: Pharmacy Technician

## 2014-01-28 HISTORY — PX: LYMPH NODE BIOPSY: SHX201

## 2014-01-30 ENCOUNTER — Ambulatory Visit (HOSPITAL_COMMUNITY)
Admission: RE | Admit: 2014-01-30 | Discharge: 2014-01-30 | Disposition: A | Discharge status: Home / Self Care | Payer: Medicare Other | Source: Ambulatory Visit | Attending: Pulmonary Disease | Admitting: Pulmonary Disease

## 2014-01-30 ENCOUNTER — Encounter (HOSPITAL_COMMUNITY): Payer: Self-pay

## 2014-01-30 DIAGNOSIS — C8590 Non-Hodgkin lymphoma, unspecified, unspecified site: Secondary | ICD-10-CM

## 2014-01-30 DIAGNOSIS — R161 Splenomegaly, not elsewhere classified: Secondary | ICD-10-CM | POA: Insufficient documentation

## 2014-01-30 DIAGNOSIS — R599 Enlarged lymph nodes, unspecified: Secondary | ICD-10-CM | POA: Insufficient documentation

## 2014-01-30 LAB — CBC
HCT: 43.4 % (ref 39.0–52.0)
Hemoglobin: 15 g/dL (ref 13.0–17.0)
MCH: 31.3 pg (ref 26.0–34.0)
MCHC: 34.6 g/dL (ref 30.0–36.0)
MCV: 90.6 fL (ref 78.0–100.0)
Platelets: 113 10*3/uL — ABNORMAL LOW (ref 150–400)
RBC: 4.79 MIL/uL (ref 4.22–5.81)
RDW: 14.5 % (ref 11.5–15.5)
WBC: 4.4 10*3/uL (ref 4.0–10.5)

## 2014-01-30 LAB — PROTIME-INR
INR: 1.06 (ref 0.00–1.49)
Prothrombin Time: 13.6 seconds (ref 11.6–15.2)

## 2014-01-30 LAB — APTT: aPTT: 35 seconds (ref 24–37)

## 2014-01-30 MED ORDER — MIDAZOLAM HCL 2 MG/2ML IJ SOLN
INTRAMUSCULAR | Status: AC | PRN
  Administered 2014-01-30: 2 mg via INTRAVENOUS

## 2014-01-30 MED ORDER — MIDAZOLAM HCL 2 MG/2ML IJ SOLN
INTRAMUSCULAR | Status: AC
  Filled 2014-01-30: qty 4

## 2014-01-30 MED ORDER — FENTANYL CITRATE 0.05 MG/ML IJ SOLN
INTRAMUSCULAR | Status: AC | PRN
  Administered 2014-01-30: 50 ug via INTRAVENOUS

## 2014-01-30 MED ORDER — SODIUM CHLORIDE 0.9 % IV SOLN
INTRAVENOUS | Status: DC
  Administered 2014-01-30: 10:00:00 via INTRAVENOUS

## 2014-01-30 MED ORDER — FENTANYL CITRATE 0.05 MG/ML IJ SOLN
INTRAMUSCULAR | Status: AC
  Filled 2014-01-30: qty 4

## 2014-01-30 NOTE — Sedation Documentation (Signed)
Take over case care

## 2014-01-30 NOTE — Procedures (Signed)
R groin LN Bx 18 g times 4 No comp

## 2014-01-30 NOTE — Discharge Instructions (Signed)
Biopsy Care After These instructions give you information on caring for yourself after your procedure. Your doctor may also give you more specific instructions. Call your doctor if you have any problems or questions after your procedure. HOME CARE   Return to your normal diet and activities as told by your doctor.  Change your bandages (dressings) as told by your doctor. If skin glue (adhesive) was used, it will peel off in 7 days.  Only take medicines as told by your doctor.  Ask your doctor when you can bathe and get your wound wet. GET HELP RIGHT AWAY IF:  You see more than a small spot of blood coming from the wound.  You have redness, puffiness (swelling), or pain.  You see yellowish-white fluid (pus) coming from the wound.  You have a fever.  You notice a bad smell coming from the wound or bandage.  You have a rash, trouble breathing, or any allergy problems. MAKE SURE YOU:   Understand these instructions.  Will watch your condition.  Will get help right away if you are not doing well or get worse. Document Released: 04/20/2011 Document Revised: 11/08/2011 Document Reviewed: 04/20/2011 [facility] Patient Information 2014 [place], [place].

## 2014-01-30 NOTE — H&P (Signed)
[name] [name] is an 73 y.o. male.   Chief Complaint: Pt developed low back pain and was seen by PMD MRI performed 01/03/14 shows incidental lymphadenopathy Further workup with CT 01/18/2014 reveals Abdominal; retroperitoneal; pelvic; groin lymphadenopathy with noted spleen enlargement suspicious for lymphoma Pt now scheduled for Rt inguinal lymph node biopsy  HPI: smoker; HTN; HLD; CAD; OSA  Past Medical History  Diagnosis Date  . Hypertension   . Hyperlipidemia   . Rhinitis   . Peripheral edema   . Osteoarthritis   . Obesity     morbid  . CAD (coronary artery disease)     s/p angioplasty  . ED (erectile dysfunction)   . OSA (obstructive sleep apnea)     severe, on CPAP 12  . Restless leg syndrome   . Insomnia   . Anxiety   . Bilateral lower extremity edema   . Tobacco abuse     Past Surgical History  Procedure Laterality Date  . Total knee arthroplasty      left  . Hemorrhoid surgery    . Back surgery  10/2009  . Eye surgery      lazer bil  . Coronary angioplasty    . Total knee arthroplasty  08/04/2012    right knee  . Total knee arthroplasty  08/04/2012    Procedure: TOTAL KNEE ARTHROPLASTY;  Surgeon: Yvette Rack., MD;  Location: [facility];  Service: Orthopedics;  Laterality: Right;  WITH PATELLA RESURFACING    Family History  Problem Relation Age of Onset  . Coronary artery disease Neg Hx    Social History:  reports that he has been smoking Cigarettes.  He has a 55 pack-year smoking history. He has never used smokeless tobacco. He reports that he does not drink alcohol or use illicit drugs.  Allergies: No Known Allergies   (Not in a hospital admission)  No results found for this or any previous visit (from the past 48 hour(s)). No results found.  Review of Systems  Constitutional: Negative for fever, chills and weight loss.  Respiratory: Positive for cough and sputum production.   Cardiovascular: Negative for chest pain.  Gastrointestinal: Negative for  nausea, vomiting and abdominal pain.  Musculoskeletal: Positive for back pain.  Neurological: Negative for weakness.  Psychiatric/Behavioral: Positive for substance abuse.       Smoker     Blood pressure 141/74, pulse 66, temperature 98.1 F (36.7 C), temperature source Oral, resp. rate 18, height 6' (1.829 m), weight 129.275 kg (285 lb), SpO2 98.00%. Physical Exam  Constitutional: He is oriented to person, place, and time.  Cardiovascular: Normal rate, regular rhythm and normal heart sounds.   No murmur heard. Respiratory: Effort normal and breath sounds normal. He has no wheezes.  GI: Soft. Bowel sounds are normal. There is no tenderness.  Musculoskeletal: Normal range of motion.  Neurological: He is alert and oriented to person, place, and time.  Skin: Skin is warm and dry.  Psychiatric: He has a normal mood and affect. His behavior is normal. Judgment and thought content normal.     Assessment/Plan Back pain LAN with spleen enlargement on CT/MRI Smoker Worrisome for lymphoma Scheduled for Rt inguinal LN bx Pt aware of procedure benefits and risks and agreeable to proceed Consent signed and in chart  Lavonia Drafts 01/30/2014, 10:30 AM

## 2014-02-06 ENCOUNTER — Encounter (HOSPITAL_COMMUNITY): Payer: Medicare Other | Attending: Hematology and Oncology

## 2014-02-06 ENCOUNTER — Encounter (HOSPITAL_BASED_OUTPATIENT_CLINIC_OR_DEPARTMENT_OTHER): Payer: Medicare Other

## 2014-02-06 ENCOUNTER — Encounter (HOSPITAL_COMMUNITY): Payer: Self-pay

## 2014-02-06 VITALS — BP 122/62 | HR 70 | Temp 97.9°F | Resp 22 | Ht 68.5 in | Wt 297.0 lb

## 2014-02-06 DIAGNOSIS — Z96659 Presence of unspecified artificial knee joint: Secondary | ICD-10-CM | POA: Insufficient documentation

## 2014-02-06 DIAGNOSIS — R591 Generalized enlarged lymph nodes: Secondary | ICD-10-CM

## 2014-02-06 DIAGNOSIS — Z79899 Other long term (current) drug therapy: Secondary | ICD-10-CM | POA: Insufficient documentation

## 2014-02-06 DIAGNOSIS — G4733 Obstructive sleep apnea (adult) (pediatric): Secondary | ICD-10-CM | POA: Insufficient documentation

## 2014-02-06 DIAGNOSIS — C8319 Mantle cell lymphoma, extranodal and solid organ sites: Secondary | ICD-10-CM | POA: Insufficient documentation

## 2014-02-06 DIAGNOSIS — F17200 Nicotine dependence, unspecified, uncomplicated: Secondary | ICD-10-CM | POA: Insufficient documentation

## 2014-02-06 DIAGNOSIS — E785 Hyperlipidemia, unspecified: Secondary | ICD-10-CM | POA: Insufficient documentation

## 2014-02-06 DIAGNOSIS — J449 Chronic obstructive pulmonary disease, unspecified: Secondary | ICD-10-CM | POA: Insufficient documentation

## 2014-02-06 DIAGNOSIS — C8318 Mantle cell lymphoma, lymph nodes of multiple sites: Secondary | ICD-10-CM

## 2014-02-06 DIAGNOSIS — I83893 Varicose veins of bilateral lower extremities with other complications: Secondary | ICD-10-CM | POA: Insufficient documentation

## 2014-02-06 DIAGNOSIS — I10 Essential (primary) hypertension: Secondary | ICD-10-CM | POA: Insufficient documentation

## 2014-02-06 DIAGNOSIS — F411 Generalized anxiety disorder: Secondary | ICD-10-CM | POA: Insufficient documentation

## 2014-02-06 DIAGNOSIS — D696 Thrombocytopenia, unspecified: Secondary | ICD-10-CM

## 2014-02-06 DIAGNOSIS — C8315 Mantle cell lymphoma, lymph nodes of inguinal region and lower limb: Secondary | ICD-10-CM

## 2014-02-06 DIAGNOSIS — G2581 Restless legs syndrome: Secondary | ICD-10-CM | POA: Insufficient documentation

## 2014-02-06 DIAGNOSIS — R059 Cough, unspecified: Secondary | ICD-10-CM | POA: Insufficient documentation

## 2014-02-06 DIAGNOSIS — M47817 Spondylosis without myelopathy or radiculopathy, lumbosacral region: Secondary | ICD-10-CM | POA: Insufficient documentation

## 2014-02-06 DIAGNOSIS — J4489 Other specified chronic obstructive pulmonary disease: Secondary | ICD-10-CM | POA: Insufficient documentation

## 2014-02-06 DIAGNOSIS — I2510 Atherosclerotic heart disease of native coronary artery without angina pectoris: Secondary | ICD-10-CM | POA: Insufficient documentation

## 2014-02-06 DIAGNOSIS — Z9861 Coronary angioplasty status: Secondary | ICD-10-CM | POA: Insufficient documentation

## 2014-02-06 DIAGNOSIS — R05 Cough: Secondary | ICD-10-CM | POA: Insufficient documentation

## 2014-02-06 LAB — COMPREHENSIVE METABOLIC PANEL
ALT: 9 U/L (ref 0–53)
AST: 13 U/L (ref 0–37)
Albumin: 4 g/dL (ref 3.5–5.2)
Alkaline Phosphatase: 87 U/L (ref 39–117)
BUN: 19 mg/dL (ref 6–23)
CO2: 24 mEq/L (ref 19–32)
CREATININE: 0.91 mg/dL (ref 0.50–1.35)
Calcium: 9.2 mg/dL (ref 8.4–10.5)
Chloride: 99 mEq/L (ref 96–112)
GFR calc Af Amer: >90 mL/min (ref >90)
GFR calc non Af Amer: 83 mL/min — ABNORMAL LOW (ref >90)
Glucose, Bld: 117 mg/dL — ABNORMAL HIGH (ref 70–99)
Potassium: 4.2 mEq/L (ref 3.7–5.3)
SODIUM: 136 meq/L — AB (ref 137–147)
TOTAL PROTEIN: 7.4 g/dL (ref 6.0–8.3)
Total Bilirubin: 0.5 mg/dL (ref 0.3–1.2)

## 2014-02-06 LAB — CBC WITH DIFFERENTIAL/PLATELET
BASOS PCT: 0 % (ref 0–1)
Basophils Absolute: 0 10*3/uL (ref 0.0–0.1)
Eosinophils Absolute: 0.1 10*3/uL (ref 0.0–0.7)
Eosinophils Relative: 1 % (ref 0–5)
HCT: 44.3 % (ref 39.0–52.0)
Hemoglobin: 15.1 g/dL (ref 13.0–17.0)
LYMPHS PCT: 15 % (ref 12–46)
Lymphs Abs: 0.8 10*3/uL (ref 0.7–4.0)
MCH: 30.2 pg (ref 26.0–34.0)
MCHC: 34.1 g/dL (ref 30.0–36.0)
MCV: 88.6 fL (ref 78.0–100.0)
Monocytes Absolute: 0.4 10*3/uL (ref 0.1–1.0)
Monocytes Relative: 9 % (ref 3–12)
NEUTROS ABS: 3.8 10*3/uL (ref 1.7–7.7)
Neutrophils Relative %: 75 % (ref 43–77)
PLATELETS: 125 10*3/uL — AB (ref 150–400)
RBC: 5 MIL/uL (ref 4.22–5.81)
RDW: 14.5 % (ref 11.5–15.5)
WBC: 5 10*3/uL (ref 4.0–10.5)

## 2014-02-06 LAB — RETICULOCYTES
RBC.: 5 MIL/uL (ref 4.22–5.81)
RETIC COUNT ABSOLUTE: 65 10*3/uL (ref 19.0–186.0)
RETIC CT PCT: 1.3 % (ref 0.4–3.1)

## 2014-02-06 LAB — LACTATE DEHYDROGENASE: LDH: 168 U/L (ref 94–250)

## 2014-02-06 NOTE — Progress Notes (Signed)
[name] [name] presented for labwork. Labs per MD order drawn via Peripheral Line 21 gauge needle inserted in left AC  Good blood return present. Procedure without incident.  Needle removed intact. Patient tolerated procedure well.

## 2014-02-06 NOTE — Patient Instructions (Signed)
[facility] Discharge Instructions  RECOMMENDATIONS MADE BY THE CONSULTANT AND ANY TEST RESULTS WILL BE SENT TO YOUR REFERRING PHYSICIAN.  EXAM FINDINGS BY THE PHYSICIAN TODAY AND SIGNS OR SYMPTOMS TO REPORT TO CLINIC OR PRIMARY PHYSICIAN: Exam and findings as discussed by Dr. Barnet Glasgow.  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  Will get you and your daughter scheduled for chemotherapy teaching and will also get you scheduled for port a cath placement by Interventional Radiology.  Lupita Raider our nurse navigator will contact you with the day and time for your teaching and Interventional Radiology will call you with instructions regarding placement of your port a catheter.  MEDICATIONS PRESCRIBED:  none  INSTRUCTIONS/FOLLOW-UP: Follow-up with Lupita Raider for teaching To be seen on the first day of therapy.  Thank you for choosing [facility] to provide your oncology and hematology care.  To afford each patient quality time with our providers, please arrive at least 15 minutes before your scheduled appointment time.  With your help, our goal is to use those 15 minutes to complete the necessary work-up to ensure our physicians have the information they need to help with your evaluation and healthcare recommendations.    Effective January 1st, 2014, we ask that you re-schedule your appointment with our physicians should you arrive 10 or more minutes late for your appointment.  We strive to give you quality time with our providers, and arriving late affects you and other patients whose appointments are after yours.    Again, thank you for choosing [facility].  Our hope is that these requests will decrease the amount of time that you wait before being seen by our physicians.       _____________________________________________________________  Should you have questions after your visit to [facility], please contact our office at [phone] between the hours of 8:30 a.m. and 5:00 p.m.  Voicemails left after 4:30 p.m. will not be returned until the following business day.  For prescription refill requests, have your pharmacy contact our office with your prescription refill request.     Implanted [facility] Guide An implanted port is a type of central line that is placed under the skin. Central lines are used to provide IV access when treatment or nutrition needs to be given through a person's veins. Implanted ports are used for long-term IV access. An implanted port may be placed because:   You need IV medicine that would be irritating to the small veins in your hands or arms.   You need long-term IV medicines, such as antibiotics.   You need IV nutrition for a long period.   You need frequent blood draws for lab tests.   You need dialysis.  Implanted ports are usually placed in the chest area, but they can also be placed in the upper arm, the abdomen, or the leg. An implanted port has two main parts:   Reservoir. The reservoir is round and will appear as a small, raised area under your skin. The reservoir is the part where a needle is inserted to give medicines or draw blood.   Catheter. The catheter is a thin, flexible tube that extends from the reservoir. The catheter is placed into a large vein. Medicine that is inserted into the reservoir goes into the catheter and then into the vein.  HOW WILL I CARE FOR MY INCISION SITE? Do not get  the incision site wet. Bathe or shower as directed by your health care provider.  HOW IS MY PORT ACCESSED? Special steps must be taken to access the port:   Before the port is accessed, a numbing cream can be placed on the skin. This helps numb the skin over the port site.   Your health care provider uses a sterile technique to access the port.  Your health care provider must put on a mask and sterile gloves.  The skin over your  port is cleaned carefully with an antiseptic and allowed to dry.  The port is gently pinched between sterile gloves, and a needle is inserted into the port.  Only "non-coring" port needles should be used to access the port. Once the port is accessed, a blood return should be checked. This helps ensure that the port is in the vein and is not clogged.   If your port needs to remain accessed for a constant infusion, a clear (transparent) bandage will be placed over the needle site. The bandage and needle will need to be changed every week, or as directed by your health care provider.   Keep the bandage covering the needle clean and dry. Do not get it wet. Follow your health care provider's instructions on how to take a shower or bath while the port is accessed.   If your port does not need to stay accessed, no bandage is needed over the port.  WHAT IS FLUSHING? Flushing helps keep the port from getting clogged. Follow your health care provider's instructions on how and when to flush the port. Ports are usually flushed with saline solution or a medicine called heparin. The need for flushing will depend on how the port is used.   If the port is used for intermittent medicines or blood draws, the port will need to be flushed:   After medicines have been given.   After blood has been drawn.   As part of routine maintenance.   If a constant infusion is running, the port may not need to be flushed.  HOW LONG WILL MY PORT STAY IMPLANTED? The port can stay in for as long as your health care provider thinks it is needed. When it is time for the port to come out, surgery will be done to remove it. The procedure is similar to the one performed when the port was put in.  WHEN SHOULD I SEEK IMMEDIATE MEDICAL CARE? When you have an implanted port, you should seek immediate medical care if:   You notice a bad smell coming from the incision site.   You have swelling, redness, or drainage at the  incision site.   You have more swelling or pain at the port site or the surrounding area.   You have a fever that is not controlled with medicine. Document Released: 08/16/2005 Document Revised: 06/06/2013 Document Reviewed: 04/23/2013 [facility] Patient Information 2014 [place]. Rituximab injection What is this medicine? RITUXIMAB (ri TUX i mab) is a monoclonal antibody. This medicine changes the way the body's immune system works. It is used commonly to treat non-Hodgkin's lymphoma and other conditions. In cancer cells, this drug targets a specific protein within cancer cells and stops the cancer cells from growing. It is also used to treat rhuematoid arthritis (RA). In RA, this medicine slow the inflammatory process and help reduce joint pain and swelling. This medicine is often used with other cancer or arthritis medications. This medicine may be used for other purposes; ask your  health care provider or pharmacist if you have questions. COMMON BRAND NAME(S): Rituxan What should I tell my health care provider before I take this medicine? They need to know if you have any of these conditions: -blood disorders -heart disease -history of hepatitis B -infection (especially a virus infection such as chickenpox, cold sores, or herpes) -irregular heartbeat -kidney disease -lung or breathing disease, like asthma -lupus -an unusual or allergic reaction to rituximab, mouse proteins, other medicines, foods, dyes, or preservatives -pregnant or trying to get pregnant -breast-feeding How should I use this medicine? This medicine is for infusion into a vein. It is administered in a hospital or clinic by a specially trained health care professional. A special MedGuide will be given to you by the pharmacist with each prescription and refill. Be sure to read this information carefully each time. Talk to your pediatrician regarding the use of this medicine in children. This medicine is not  approved for use in children. Overdosage: If you think you have taken too much of this medicine contact a poison control center or emergency room at once. NOTE: This medicine is only for you. Do not share this medicine with others. What if I miss a dose? It is important not to miss a dose. Call your doctor or health care professional if you are unable to keep an appointment. What may interact with this medicine? -cisplatin -medicines for blood pressure -some other medicines for arthritis -vaccines This list may not describe all possible interactions. Give your health care provider a list of all the medicines, herbs, non-prescription drugs, or dietary supplements you use. Also tell them if you smoke, drink alcohol, or use illegal drugs. Some items may interact with your medicine. What should I watch for while using this medicine? Report any side effects that you notice during your treatment right away, such as changes in your breathing, fever, chills, dizziness or lightheadedness. These effects are more common with the first dose. Visit your prescriber or health care professional for checks on your progress. You will need to have regular blood work. Report any other side effects. The side effects of this medicine can continue after you finish your treatment. Continue your course of treatment even though you feel ill unless your doctor tells you to stop. Call your doctor or health care professional for advice if you get a fever, chills or sore throat, or other symptoms of a cold or flu. Do not treat yourself. This drug decreases your body's ability to fight infections. Try to avoid being around people who are sick. This medicine may increase your risk to bruise or bleed. Call your doctor or health care professional if you notice any unusual bleeding. Be careful brushing and flossing your teeth or using a toothpick because you may get an infection or bleed more easily. If you have any dental work done,  tell your dentist you are receiving this medicine. Avoid taking products that contain aspirin, acetaminophen, ibuprofen, naproxen, or ketoprofen unless instructed by your doctor. These medicines may hide a fever. Do not become pregnant while taking this medicine. Women should inform their doctor if they wish to become pregnant or think they might be pregnant. There is a potential for serious side effects to an unborn child. Talk to your health care professional or pharmacist for more information. Do not breast-feed an infant while taking this medicine. What side effects may I notice from receiving this medicine? Side effects that you should report to your doctor or health care professional  as soon as possible: -allergic reactions like skin rash, itching or hives, swelling of the face, lips, or tongue -low blood counts - this medicine may decrease the number of white blood cells, red blood cells and platelets. You may be at increased risk for infections and bleeding. -signs of infection - fever or chills, cough, sore throat, pain or difficulty passing urine -signs of decreased platelets or bleeding - bruising, pinpoint red spots on the skin, black, tarry stools, blood in the urine -signs of decreased red blood cells - unusually weak or tired, fainting spells, lightheadedness -breathing problems -confused, not responsive -chest pain -fast, irregular heartbeat -feeling faint or lightheaded, falls -mouth sores -redness, blistering, peeling or loosening of the skin, including inside the mouth -stomach pain -swelling of the ankles, feet, or hands -trouble passing urine or change in the amount of urine Side effects that usually do not require medical attention (report to your doctor or other health care professional if they continue or are bothersome): -anxiety -headache -loss of appetite -muscle aches -nausea -night sweats This list may not describe all possible side effects. Call your doctor  for medical advice about side effects. You may report side effects to FDA at 1-800-FDA-1088. Where should I keep my medicine? This drug is given in a hospital or clinic and will not be stored at home. NOTE: This sheet is a summary. It may not cover all possible information. If you have questions about this medicine, talk to your doctor, pharmacist, or health care provider.  2014, Elsevier/Gold Standard. (2008-04-15 14:04:59) Bendamustine Injection What is this medicine? BENDAMUSTINE (BEN da MUS teen) is a chemotherapy drug. It is used to treat chronic lymphocytic leukemia and non-Hodgkin lymphoma. This medicine may be used for other purposes; ask your health care provider or pharmacist if you have questions. COMMON BRAND NAME(S): Treanda What should I tell my health care provider before I take this medicine? They need to know if you have any of these conditions: -kidney disease -liver disease -an unusual or allergic reaction to bendamustine, mannitol, other medicines, foods, dyes, or preservatives -pregnant or trying to get pregnant -breast-feeding How should I use this medicine? This medicine is for infusion into a vein. It is given by a health care professional in a hospital or clinic setting. Talk to your pediatrician regarding the use of this medicine in children. Special care may be needed. Overdosage: If you think you have taken too much of this medicine contact a poison control center or emergency room at once. NOTE: This medicine is only for you. Do not share this medicine with others. What if I miss a dose? It is important not to miss your dose. Call your doctor or health care professional if you are unable to keep an appointment. What may interact with this medicine? Do not take this medicine with any of the following medications: -clozapine This medicine may also interact with the following  medications: -atazanavir -cimetidine -ciprofloxacin -enoxacin -fluvoxamine -medicines for seizures like carbamazepine and phenobarbital -mexiletine -rifampin -tacrine -thiabendazole -zileuton This list may not describe all possible interactions. Give your health care provider a list of all the medicines, herbs, non-prescription drugs, or dietary supplements you use. Also tell them if you smoke, drink alcohol, or use illegal drugs. Some items may interact with your medicine. What should I watch for while using this medicine? Your condition will be monitored carefully while you are receiving this medicine. This drug may make you feel generally unwell. This is not uncommon, as chemotherapy can  affect healthy cells as well as cancer cells. Report any side effects. Continue your course of treatment even though you feel ill unless your doctor tells you to stop. Call your doctor or health care professional for advice if you get a fever, chills or sore throat, or other symptoms of a cold or flu. Do not treat yourself. This drug decreases your body's ability to fight infections. Try to avoid being around people who are sick. This medicine may increase your risk to bruise or bleed. Call your doctor or health care professional if you notice any unusual bleeding. Be careful brushing and flossing your teeth or using a toothpick because you may get an infection or bleed more easily. If you have any dental work done, tell your dentist you are receiving this medicine. Avoid taking products that contain aspirin, acetaminophen, ibuprofen, naproxen, or ketoprofen unless instructed by your doctor. These medicines may hide a fever. Do not become pregnant while taking this medicine. Women should inform their doctor if they wish to become pregnant or think they might be pregnant. There is a potential for serious side effects to an unborn child. Men should inform their doctors if they wish to father a child. This  medicine may lower sperm counts. Talk to your health care professional or pharmacist for more information. Do not breast-feed an infant while taking this medicine. What side effects may I notice from receiving this medicine? Side effects that you should report to your doctor or health care professional as soon as possible: -allergic reactions like skin rash, itching or hives, swelling of the face, lips, or tongue -low blood counts - this medicine may decrease the number of white blood cells, red blood cells and platelets. You may be at increased risk for infections and bleeding. -signs of infection - fever or chills, cough, sore throat, pain or difficulty passing urine -signs of decreased platelets or bleeding - bruising, pinpoint red spots on the skin, black, tarry stools, blood in the urine -signs of decreased red blood cells - unusually weak or tired, fainting spells, lightheadedness -trouble passing urine or change in the amount of urine Side effects that usually do not require medical attention (report to your doctor or health care professional if they continue or are bothersome): -diarrhea This list may not describe all possible side effects. Call your doctor for medical advice about side effects. You may report side effects to FDA at 1-800-FDA-1088. Where should I keep my medicine? This drug is given in a hospital or clinic and will not be stored at home. NOTE: This sheet is a summary. It may not cover all possible information. If you have questions about this medicine, talk to your doctor, pharmacist, or health care provider.  2014, Elsevier/Gold Standard. (2011-11-12 14:15:47) Pegfilgrastim injection What is this medicine? PEGFILGRASTIM (peg fil GRA stim) helps the body make more white blood cells. It is used to prevent infection in people with low amounts of white blood cells following cancer treatment. This medicine may be used for other purposes; ask your health care provider or  pharmacist if you have questions. COMMON BRAND NAME(S): Neulasta What should I tell my health care provider before I take this medicine? They need to know if you have any of these conditions: -sickle cell disease -an unusual or allergic reaction to pegfilgrastim, filgrastim, E.coli protein, other medicines, foods, dyes, or preservatives -pregnant or trying to get pregnant -breast-feeding How should I use this medicine? This medicine is for injection under the skin. It is  usually given by a health care professional in a hospital or clinic setting. If you get this medicine at home, you will be taught how to prepare and give this medicine. Do not shake this medicine. Use exactly as directed. Take your medicine at regular intervals. Do not take your medicine more often than directed. It is important that you put your used needles and syringes in a special sharps container. Do not put them in a trash can. If you do not have a sharps container, call your pharmacist or healthcare provider to get one. Talk to your pediatrician regarding the use of this medicine in children. While this drug may be prescribed for children who weigh more than 45 kg for selected conditions, precautions do apply Overdosage: If you think you have taken too much of this medicine contact a poison control center or emergency room at once. NOTE: This medicine is only for you. Do not share this medicine with others. What if I miss a dose? If you miss a dose, take it as soon as you can. If it is almost time for your next dose, take only that dose. Do not take double or extra doses. What may interact with this medicine? -lithium -medicines for growth therapy This list may not describe all possible interactions. Give your health care provider a list of all the medicines, herbs, non-prescription drugs, or dietary supplements you use. Also tell them if you smoke, drink alcohol, or use illegal drugs. Some items may interact with your  medicine. What should I watch for while using this medicine? Visit your doctor for regular check ups. You will need important blood work done while you are taking this medicine. What side effects may I notice from receiving this medicine? Side effects that you should report to your doctor or health care professional as soon as possible: -allergic reactions like skin rash, itching or hives, swelling of the face, lips, or tongue -breathing problems -fever -pain, redness, or swelling where injected -shoulder pain -stomach or side pain Side effects that usually do not require medical attention (report to your doctor or health care professional if they continue or are bothersome): -aches, pains -headache -loss of appetite -nausea, vomiting -unusually tired This list may not describe all possible side effects. Call your doctor for medical advice about side effects. You may report side effects to FDA at 1-800-FDA-1088. Where should I keep my medicine? Keep out of the reach of children. Store in a refrigerator between 2 and 8 degrees C (36 and 46 degrees F). Do not freeze. Keep in carton to protect from light. Throw away this medicine if it is left out of the refrigerator for more than 48 hours. Throw away any unused medicine after the expiration date. NOTE: This sheet is a summary. It may not cover all possible information. If you have questions about this medicine, talk to your doctor, pharmacist, or health care provider.  2014, Elsevier/Gold Standard. (2008-03-18 15:41:44)

## 2014-02-06 NOTE — Progress Notes (Signed)
[name] [name], M.D.  NEW PATIENT EVALUATION   Name: [name] [name] Date: 02/06/2014 MRN: [id number] DOB: October 14, 1940  PCP: Alonza Bogus, MD   REFERRING PHYSICIAN: Alonza Bogus, MD  REASON FOR REFERRAL: Diffuse lymphadenopathy     HISTORY OF PRESENT ILLNESS:[name] [name] is a 73 y.o. male who is referred by his family physician because of discovery of abdominal lymphadenopathy while undergoing workup for back pain. He was discovered to have diffuse lymphadenopathy and underwent an inguinal lymph node biopsy on 01/30/2014 with final diagnoses being mantle cell lymphoma. He is here today to discuss the diagnosis and management.  He does have chronic cough but without expectoration. He has chronic back pain with lower 70 swelling. He denies any PND, orthopnea, or palpitations but a short of breath on exertion. He is morbidly obese and smokes one pack of cigarettes daily. He denies any sore throat, fever, night sweats, abdominal distention, diarrhea, constipation, melena, hematochezia, hematuria, urinary hesitancy, skin rash, headache, or seizures.   PAST MEDICAL HISTORY:  has a past medical history of Hypertension; Hyperlipidemia; Rhinitis; Peripheral edema; Osteoarthritis; Obesity; CAD (coronary artery disease); ED (erectile dysfunction); OSA (obstructive sleep apnea); Restless leg syndrome; Insomnia; Anxiety; Bilateral lower extremity edema; and Tobacco abuse.     PAST SURGICAL HISTORY: Past Surgical History  Procedure Laterality Date   Total knee arthroplasty      left   Hemorrhoid surgery     Back surgery  10/2009   Eye surgery      lazer bil   Coronary angioplasty     Total knee arthroplasty  08/04/2012    right knee   Total knee arthroplasty  08/04/2012    Procedure: TOTAL KNEE ARTHROPLASTY;  Surgeon: Yvette Rack., MD;  Location: [facility];  Service: Orthopedics;  Laterality: Right;  WITH PATELLA  RESURFACING   Lymph node biopsy Right 01/2014    groin area     CURRENT MEDICATIONS: has a current medication list which includes the following prescription(s): celecoxib, clonazepam, gabapentin, multivitamin with minerals, oxycodone-acetaminophen, ropinirole, and tizanidine.   ALLERGIES: Review of patient's allergies indicates no known allergies.   SOCIAL HISTORY:  reports that he has been smoking Cigarettes.  He has a 55 pack-year smoking history. He has never used smokeless tobacco. He reports that he does not drink alcohol or use illicit drugs.   FAMILY HISTORY: family history is negative for Coronary artery disease.    REVIEW OF SYSTEMS:  Other than that discussed above is noncontributory.    PHYSICAL EXAM:  height is 5' 8.5" (1.74 m) and weight is 297 lb (134.718 kg). His temperature is 97.9 F (36.6 C). His blood pressure is 122/62 and his pulse is 70. His respiration is 22 and oxygen saturation is 94%.    GENERAL:alert, no distress and comfortable. Morbidly obese. SKIN: skin color, texture, turgor are normal, no rashes or significant lesions EYES: normal, Conjunctiva are pink and non-injected, sclera clear OROPHARYNX:no exudate, no erythema and lips, buccal mucosa, and tongue normal  NECK: supple, thyroid normal size, non-tender, without nodularity CHEST: Increased AP diameter with no gynecomastia. LYMPH:   palpable lymphadenopathy in the cervical, axillary or inguinal. Inguinal lymph node biopsy site is well-healed. LUNGS: clear to auscultation and percussion with normal breathing effort HEART: regular rate & rhythm and no murmurs ABDOMEN:abdomen soft, non-tender and normal bowel sounds MUSCULOSKELETALl:no cyanosis of digits,. No clubbing but with bilateral lower extremity +1 to +  2 edema with superficial varicosities. Homans sign negative. NEURO: alert & oriented x 3 with fluent speech, no focal motor/sensory deficits    LABORATORY DATA:  Office Visit on 02/06/2014    Component Date Value Ref Range Status   WBC 02/06/2014 5.0  4.0 - 10.5 K/uL Final   RBC 02/06/2014 5.00  4.22 - 5.81 MIL/uL Final   Hemoglobin 02/06/2014 15.1  13.0 - 17.0 g/dL Final   HCT 02/06/2014 44.3  39.0 - 52.0 % Final   MCV 02/06/2014 88.6  78.0 - 100.0 fL Final   MCH 02/06/2014 30.2  26.0 - 34.0 pg Final   MCHC 02/06/2014 34.1  30.0 - 36.0 g/dL Final   RDW 02/06/2014 14.5  11.5 - 15.5 % Final   Platelets 02/06/2014 125* 150 - 400 K/uL Final   Neutrophils Relative % 02/06/2014 75  43 - 77 % Final   Neutro Abs 02/06/2014 3.8  1.7 - 7.7 K/uL Final   Lymphocytes Relative 02/06/2014 15  12 - 46 % Final   Lymphs Abs 02/06/2014 0.8  0.7 - 4.0 K/uL Final   Monocytes Relative 02/06/2014 9  3 - 12 % Final   Monocytes Absolute 02/06/2014 0.4  0.1 - 1.0 K/uL Final   Eosinophils Relative 02/06/2014 1  0 - 5 % Final   Eosinophils Absolute 02/06/2014 0.1  0.0 - 0.7 K/uL Final   Basophils Relative 02/06/2014 0  0 - 1 % Final   Basophils Absolute 02/06/2014 0.0  0.0 - 0.1 K/uL Final   Retic Ct Pct 02/06/2014 1.3  0.4 - 3.1 % Final   RBC. 02/06/2014 5.00  4.22 - 5.81 MIL/uL Final   Retic Count, Manual 02/06/2014 65.0  19.0 - 186.0 K/uL Final  Hospital Outpatient Visit on 01/30/2014  Component Date Value Ref Range Status   aPTT 01/30/2014 35  24 - 37 seconds Final   WBC 01/30/2014 4.4  4.0 - 10.5 K/uL Final   RBC 01/30/2014 4.79  4.22 - 5.81 MIL/uL Final   Hemoglobin 01/30/2014 15.0  13.0 - 17.0 g/dL Final   HCT 01/30/2014 43.4  39.0 - 52.0 % Final   MCV 01/30/2014 90.6  78.0 - 100.0 fL Final   MCH 01/30/2014 31.3  26.0 - 34.0 pg Final   MCHC 01/30/2014 34.6  30.0 - 36.0 g/dL Final   RDW 01/30/2014 14.5  11.5 - 15.5 % Final   Platelets 01/30/2014 113* 150 - 400 K/uL Final   Comment: SPECIMEN CHECKED FOR CLOTS                          REPEATED TO VERIFY                          PLATELET COUNT CONFIRMED BY SMEAR   Prothrombin Time 01/30/2014 13.6  11.6 -  15.2 seconds Final   INR 01/30/2014 1.06  0.00 - 1.49 Final  Hospital Outpatient Visit on 01/18/2014  Component Date Value Ref Range Status   BUN 01/18/2014 22  6 - 23 mg/dL Final   Creatinine, Ser 01/18/2014 1.05  0.50 - 1.35 mg/dL Final   GFR calc non Af Amer 01/18/2014 69* >90 mL/min Final   GFR calc Af Amer 01/18/2014 80* >90 mL/min Final   Comment: (NOTE)                          The eGFR has been calculated using  the CKD EPI equation.                          This calculation has not been validated in all clinical situations.                          eGFR's persistently <90 mL/min signify possible Chronic Kidney                          Disease.    Urinalysis    Component Value Date/Time   COLORURINE AMBER* 07/28/2012 1230   APPEARANCEUR CLEAR 07/28/2012 1230   LABSPEC 1.032* 07/28/2012 1230   PHURINE 5.5 07/28/2012 1230   GLUCOSEU NEGATIVE 07/28/2012 1230   HGBUR NEGATIVE 07/28/2012 1230   BILIRUBINUR SMALL* 07/28/2012 1230   KETONESUR 15* 07/28/2012 1230   PROTEINUR NEGATIVE 07/28/2012 1230   UROBILINOGEN 1.0 07/28/2012 1230   NITRITE NEGATIVE 07/28/2012 1230   LEUKOCYTESUR TRACE* 07/28/2012 1230      '@RADIOGRAPHY'$ : Dg Chest 2 View  01/18/2014   CLINICAL DATA:  History of lymphoma and smoking  EXAM: CHEST  2 VIEW  COMPARISON:  07/28/2012  FINDINGS: Cardiac shadow is stable. Exuberant calcification is noted at the first costochondral margin bilaterally. No focal infiltrate or sizable effusion is seen. Mild eventration of the right hemidiaphragm is noted. Degenerative change of the thoracic spine is seen.  IMPRESSION: No acute abnormality noted.   Electronically Signed   By: Inez Catalina M.D.   On: 01/18/2014 16:47   Ct Abdomen Pelvis W Contrast  01/18/2014   CLINICAL DATA:  Abnormal MRI.  Evaluate adenopathy.  EXAM: CT ABDOMEN AND PELVIS WITH CONTRAST  TECHNIQUE: Multidetector CT imaging of the abdomen and pelvis was performed using the standard protocol following  bolus administration of intravenous contrast.  CONTRAST:  13mL OMNIPAQUE IOHEXOL 300 MG/ML  SOLN  COMPARISON:  Lumbar MRI, 01/03/2014  FINDINGS: There are multiple enlarged retroperitoneal and mesenteric lymph nodes.  Adenopathy is seen in the teres celiac and gastrohepatic ligament chains. Largest nodes are along the gastrohepatic ligament chain, with 2 posterior nodes measuring 21 mm in short axis and 19 mm in short axis. Retroperitoneal adenopathy is also noted. One of the dominant retroperitoneal nodes is an aortocaval node measuring 21 mm short axis. There are numerous enlarged mesenteric lymph nodes with many other subcentimeter but prominent lymph nodes. Two reference measurements were made. There is a 17 mm short axis node to the left of the superior mesenteric artery. There is an 18 mm short axis node in the left central small bowel mesentery.  Pelvic adenopathy and inguinal adenopathy. A right external iliac chain lymph node measures 5.4 cm x 2.4 cm in greatest transverse dimension. A right common iliac chain node measures 18 mm in short axis. A right inguinal lymph node measures 18 mm in short axis.  There is also splenomegaly. Spleen measures 22 cm by 16.3 cm x 13.8 cm. No focal splenic mass.  There is interstitial thickening and reticular opacities at the lung bases, the latter consistent with subsegmental atelectasis. The heart is normal in size. No pleural effusion.  Liver is unremarkable. There are gallstones, but no evidence of acute cholecystitis. No bile duct dilation. Normal pancreas. No adrenal masses. Kidneys, ureters and bladder are unremarkable.  Atherosclerotic changes are noted throughout the abdominal aorta and its branch vessels. The infrarenal aorta dilates to 3.3 cm in greatest  anterior-posterior dimension consistent with a small aneurysm. Common iliac arteries are dilated to 2.2 cm on the right and 2.1 cm on the left.  Colon and small bowel are unremarkable. A normal appendix is  visualized. Small fat containing umbilical hernia.  There are significant degenerative changes throughout the visualized spine. No osteoblastic or osteolytic lesions.  IMPRESSION: 1. Numerous enlarged and prominent lymph nodes in the upper abdomen, retroperitoneum, throughout the small bowel mesenteries and in the pelvis and groins. There is also an enlarged spleen. The combination of findings is highly suspicious for lymphoma. 2. No acute findings. 3. Gallstones and advanced degenerative changes throughout the visualized spine.   Electronically Signed   By: Lajean Manes M.D.   On: 01/18/2014 17:59   US Biopsy  01/30/2014   CLINICAL DATA:  Splenomegaly and adenopathy.  Rule out lymphoma.  EXAM: ULTRASOUND-GUIDED BIOPSY OF A RIGHT INGUINAL LYMPH NODE.  CORE.  MEDICATIONS AND MEDICAL HISTORY: Versed 2 mg, Fentanyl 50 mcg.  Additional Medications: None.  ANESTHESIA/SEDATION: Moderate sedation time: 8 minutes  PROCEDURE: The procedure, risks, benefits, and alternatives were explained to the patient. Questions regarding the procedure were encouraged and answered. The patient understands and consents to the procedure.  The right groin was prepped with patent in a sterile fashion, and a sterile drape was applied covering the operative field. A sterile gown and sterile gloves were used for the procedure.  Under sonographic guidance, 4 18 gauge core biopsies of the enlarged right inguinal lymph node were obtained. Final imaging was performed.  Patient tolerated the procedure well without complication. Vital sign monitoring by nursing staff during the procedure will continue as patient is in the special procedures unit for post procedure observation.  FINDINGS: The images document guide needle placement within the enlarged right inguinal lymph node. Post biopsy images demonstrate no hemorrhage.  IMPRESSION: Successful ultrasound-guided core biopsy of an enlarged right inguinal lymph node.   Electronically Signed   By: Maryclare Bean M.D.   On: 01/30/2014 13:01    PATHOLOGY:  FINAL for [name] [name], [name] [name] ([id number]) Patient: [name] Collected: 01/30/2014 Client: [facility] Accession: [id number] Received: 01/30/2014 Art Hoss DOB: [birth year] Age: 77 Gender: M Reported: 02/01/2014 [address] Patient Ph: [phone] MRN #: [id number] [place], [place] [address] Visit #: [id number] Chart #: Phone:  Fax: CC: Sinda Du FLOW CYTOMETRY REPORT INTERPRETATION Interpretation Tissue-Flow Cytometry - MONOCLONAL B-CELL POPULATION IDENTIFIED. 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 The presence of CD5 expression correlates with the diagnosis of mantle cell lymphoma seen in the lymph node biopsy ([id number]). (BNS:ecj 02/01/2014) Susanne Greenhouse MD Pathologist, Electronic Signature (Case signed 02/01/2014) GROSS AND MICROSCOPIC INFORMATION Source Tissue-Flow Cytometry Microscopic Gated population: Flow cytometric immunophenotyping is performed using antibiodies to the antigens listed in the table below. Electronic gates are placed around a cell cluster displaying light scatter properties corresponding to lymphocytes. - Abnormal Cells in gated population: 81 % - Phenotype of Abnormal Cells: CD5, CD19, CD20, CD21, CD22, HLA-Dr, Kappa 1 of 2 FINAL for Longo, Danh V ([id number]) Specimen Table Lymphoid Associated Myeloid Associated Misc. As CD2 neg CD19 pos CD11c ND CD45 ND CD3 neg CD20 pos CD13 ND HLA-DR pos CD4 neg CD21 pos CD14 ND CD10 neg CD5 pos CD22 pos CD15 ND CD56/16 ND CD7 neg CD23 neg CD33 ND ZAP70 ND CD8 neg CD103 ND MPO ND CD34 ND CD25 ND FMC7 ND CD117 ND CD52 ND sKappa pos CD38 ND sLambda neg 7AAD tested cKappa  ND cLambda ND Gross ref. [phone] All controls stained appropriately. The above tests were developed and their performance characteristics determined by the [facility]. They have not been cleared or  approved by the U.S. Food and Drug Administration." Report signed from the following location(s) Interpretation performed at [facility], [place], [place] [address]. CLIA #: [id number],    FINAL for DONYALE, BERTHOLD ([id number]) Patient: [name] Collected: 01/30/2014 Client: [facility] Accession: [id number] Received: 01/30/2014 Art Hoss DOB: 08/15/1941 Age: 29 Gender: M Reported: 02/01/2014 [address] Patient Ph: [phone] MRN #: [id number] [place], [place] [address] Visit #: [id number].[id number] Chart #: Phone:  Fax: CC: Alonza Bogus, MD REPORT OF SURGICAL PATHOLOGY FINAL DIAGNOSIS Diagnosis Lymph node, biopsy, Right Groin- LN - MANTLE CELL LYMPHOMA. - SEE ONCOLOGY TABLE. Microscopic Comment LYMPHOMA Histologic type: Non-Hodgkin lymphoma, mantle cell type. Grade (if applicable): Intermediate grade. Flow cytometry: Monoclonal, kappa restricted B-cell population expressing pan B-cell antigens including CD20 in association with CD5 expression. No CD10 or CD23 expression identified. Immunohistochemical stains: CD20, CD79a, CD3, CD43, CD5, CD10, BCL-6 and cyclin D1 with appropriate controls. Touch preps/imprints: Not done. Comments: The sections show small needle biopsy fragments displaying a relatively monomorphic population of small round to irregular lymphoid cells with high nuclear cytoplasmic ratio, dense chromatin and small to inconspicuous nucleoli associated with scattered mitosis and lack of necrosis. The appearance is apparently diffuse with lack of obvious or definite follicular structures or proliferation centers. To further evaluate this process, flow cytometric analysis was performed and shows a monoclonal, kappa restricted B-cell population expressing pan B-cell antigens including CD20 in association with CD5 expression. In addition, immunohistochemical stains were performed and show that the lymphoid  cells are predominantly composed of B lymphocytes as seen with CD20 and CD79a associated with CD5, CD43 and cyclin D1 expression in B-cell areas. No appreciable CD10 or BCL-6 positivity is identified. There is an admixed minor component of T cells as primarily seen with CD3. The histologic and immunophenotypic features are consistent with mantle cell lymphoma. (BNS:ecj 02/01/2014) Susanne Greenhouse MD Pathologist, Electronic Signature (Case signed 02/01/2014) Specimen Gross and Clinical Information Specimen(s) Obtained: Lymph node, biopsy, Right Groin- LN 1 of 2 FINAL for TRIMAINE, MASER [phone]) Specimen Clinical Information (tl) Gross Received fresh are cores of soft white tissue having an aggregate measurement of 0.8 x 0.5 x 0.1 cm. A portion of the specimen is placed in RPMI and the remainder is entirely submitted in one cassette. (GRP:kh 01-30-14) Stain(s) used in Diagnosis: The following stain(s) were used in diagnosing the case: CD-10, CD 43, BCl 6 , CD 3, CYCLIN D1, CD 20, CD 5, CD 79a. The control(s) stained appropriately. Disclaimer Some of these immunohistochemical stains may have been developed and the performance characteristics determined by [facility]. Some may not have been cleared or approved by the U.S. Food and Drug Administration. The FDA has determined that such clearance or approval is not necessary. This test is used for clinical purposes. It should not be regarded as investigational or for research. This laboratory is certified under the [facility] (CLIA-88) as qualified to perform high complexity clinical laboratory testing. Report signed out from the following location(s) Technical Component performed at [facility]. [address],[place] [address].ZHGD:[id number]., Technical Component performed at [place], [place],  [address].  CLIA #: [id number], Interpretation performed at [facility], [place],  [place] [address].     IMPRESSION:  #1. Mantle cell lymphoma, at least stage III, for peripheral blood flow cytometry today with thrombocytopenia, probably on an immune basis. #2. Chronic obstructive pulmonary disease. #3. Degenerative joint disease lumbar spine. #4. Coronary artery disease without evidence of heart failure or dysrhythmia. #5. Lower extremity varicosities. #6. Obstructive sleep apnea syndrome. #7. Restless leg syndrome.   PLAN:  #1. A discussion was held the patient regarding the nature of his disease and its management. Systemic chemotherapy is a treatment choice and I would choose a less aggressive regimen due to his age and comorbidity. #2. Plan is to give bendamustine/Rituxan with Neulasta support along with rasburicase monthly with treatments on day 1 and 2 of each 28 day cycle. #3. Interventional radiology will be consulted for insertion of a LifePort. #4. Nurse navigator for chemotherapy teaching to the patient and his daughter who lives with them. #5. Plan to start treatment on 02/25/2014.  I appreciate the opportunity sharing in his care.   Doroteo Bradford, MD 02/06/2014 4:19 PM   DISCLAIMER:  This note was dictated with voice recognition softwre.  Similar sounding words can inadvertently be transcribed inaccurately and may not be corrected upon review.

## 2014-02-07 ENCOUNTER — Encounter (HOSPITAL_COMMUNITY): Payer: Self-pay | Admitting: Pharmacy Technician

## 2014-02-08 LAB — BETA 2 MICROGLOBULIN, SERUM: Beta-2 Microglobulin: 3.64 mg/L — ABNORMAL HIGH (ref <=2.51)

## 2014-02-11 ENCOUNTER — Other Ambulatory Visit (HOSPITAL_COMMUNITY): Payer: Self-pay | Admitting: Hematology and Oncology

## 2014-02-11 ENCOUNTER — Other Ambulatory Visit: Payer: Self-pay | Admitting: Radiology

## 2014-02-11 MED ORDER — ACYCLOVIR 400 MG PO TABS: 400.0000 mg | ORAL_TABLET | Freq: Every day | ORAL | Status: DC

## 2014-02-11 MED ORDER — PROCHLORPERAZINE MALEATE 10 MG PO TABS: 10.0000 mg | ORAL_TABLET | Freq: Four times a day (QID) | ORAL | Status: DC | PRN

## 2014-02-11 MED ORDER — LIDOCAINE-PRILOCAINE 2.5-2.5 % EX CREA: TOPICAL_CREAM | CUTANEOUS | Status: DC

## 2014-02-11 MED ORDER — ALLOPURINOL 300 MG PO TABS: 300.0000 mg | ORAL_TABLET | Freq: Every day | ORAL | Status: DC

## 2014-02-11 NOTE — Patient Instructions (Addendum)
[facility]   CHEMOTHERAPY INSTRUCTIONS  Rituxan - 1 hour prior to taking Rituxan you need to take Tylenol 650mg  and Benadryl 50mg . You can take this at home or wait and let us give it to you @ the [facility]. This reduces your risk of having an allergic reaction to the Rituxan. You will do this each time prior to Rituxan.   Side Effects: during infusion - itching, low blood pressure, low oxygen, bronchospasm, rash, trouble breathing - we need to know immediately if any of this happens. The first time you receive this drug, it takes a long time to infuse because we titrate the drug very slowly. With each Rituxan infusion, the likelihood of developing an infusion reaction decreases. You may also experience fever, chills, shaking chills, headaches, muscle aches, nausea, rash, and a low white blood cell count. We need to be sure that you are drinking plenty of fluids - preferably 64oz of decaff fluids/water daily. It is best to start drinking fluids 2 days prior to treatment and for up to 4-5 days after treatment. As your tumor breaks down, it leaves behind uric acid and the extra fluid that you drink helps to flush this out of your body. You will also be on a medication called Allopurinol daily while taking Rituxan which help rid your body of the uric acid. It is important that you take this medication daily as prescribed.    Bendamustine - low white blood cell count, low platelet count, fever, nausea, vomiting. This infuses over 30-60 minutes.  Premeds:  Day 1 chemo: Tylenol/Benadryl pills (given to keep you from having an allergic reaction to the Rituxan), Zofran intravenously (given to prevent/reduce nausea/vomiting, Dexamethasone intravenously (steroid - given to reduce your risk of having an allergic reaction as well as to decrease nausea/vomiting). Side Effect of Dexamethasone --- redness in face/neck/chest or feeling hot in those areas, trouble sleeping,  irritability, having energy). 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 The tumor cells break down into uric acid and the Elitek helps get the uric acid into a form that is more easily eliminated by/through the kidneys thus protecting the kidneys from damage.    Day 2 of chemo: Zofran, Dexamethasone, Elitek   Neulasta - this medication is not chemo but being given because you have had chemo. It is usually given 20-24 hours after the completion of chemotherapy. This medication works by boosting your bone marrow's supply of white blood cells. White blood cells are what protect our bodies against infection. The medication is given in the form of a subcutaneous injection. It is given in the fatty tissue of your abdomen. It is a short needle. The major side effect of this medication is bone or muscle pain. The drug of choice to relieve or lessen the pain is Aleve or Ibuprofen. If a physician has ever told you not to take Aleve or Ibuprofen - then don't take it. You should then take Tylenol/acetaminophen. Take either medication as the bottle directs you to.  The level of pain you experience as a result of this injection can range from none, to mild or moderate, or severe. Please let us know if you develop moderate or severe bone pain.     POTENTIAL SIDE EFFECTS OF TREATMENT: Increased Susceptibility to Infection due to Bone Marrow Suppression, Constipation, Changes in Character of Skin and  Nails (brittleness, dryness,etc.), Sensitivity and Mouth Sores   SELF IMAGE NEEDS AND REFERRALS MADE: Obtain hair accessories as soon as possible (caps,etc.)   EDUCATIONAL MATERIALS GIVEN AND REVIEWED: Chemotherapy and You booklet Specific Instructions Sheets: Rituxan, Bendamustine, Elitek, Zofran, Dexamethasone, Tylenol, Benadryl, EMLA cream, Allopurinol, Acyclovir, Compazine, Neulasta   SELF CARE  ACTIVITIES WHILE ON CHEMOTHERAPY: Increase your fluid intake 48 hours prior to treatment and drink at least 2 quarts per day after treatment., No alcohol intake., No aspirin or other medications unless approved by your oncologist., Eat foods that are light and easy to digest., Eat foods at cold or room temperature., No fried, fatty, or spicy foods immediately before or after treatment., Have teeth cleaned professionally before starting treatment. Keep dentures and partial plates clean., Use soft toothbrush and do not use mouthwashes that contain alcohol. Biotene is a good mouthwash. Use warm salt water gargles (1 teaspoon salt per 1 quart warm water) before and after meals and at bedtime. Or you may rinse with 2 tablespoons of three -percent hydrogen peroxide mixed in eight ounces of water., Always use sunscreen with SPF (Sun Protection Factor) of 30 or higher., Use your nausea medication as directed to prevent nausea., Use your stool softener or laxative as directed to prevent constipation. and Use your anti-diarrheal medication as directed to stop diarrhea.  Please wash your hands for at least 30 seconds using warm soapy water. Handwashing is the #1 way to prevent the spread of germs. Stay away from sick people or people who are getting over a cold. If you develop respiratory systems such as green/yellow mucus production or productive cough or persistent cough let us know and we will see if you need an antibiotic. It is a good idea to keep a pair of gloves on when going into grocery stores/[employer] to decrease your risk of coming into contact with germs on the carts, etc. Carry alcohol hand gel with you at all times and use it frequently if out in public. All foods need to be cooked thoroughly. No raw foods. No medium or undercooked meats, eggs. If your food is cooked medium well, it does not need to be hot pink or saturated with bloody liquid at all. Vegetables and fruits need to be washed/rinsed under the  faucet with a dish detergent before being consumed. You can eat raw fruits and vegetables unless we tell you otherwise but it would be best if you cooked them or bought frozen. Do not eat off of salad bars or hot bars unless you really trust the cleanliness of the restaurant. If you need dental work, please let us know before you go for your appointment so that we can coordinate the best possible time for you in regards to your chemo regimen. You need to also let your dentist know that you are actively taking chemo. We may need to do labs prior to your dental appointment. We also want your bowels moving at least every other day. If this is not happening, we need to know so that we can get you on a bowel regimen to help you go.     MEDICATIONS: You have been given prescriptions for the following medications:  Allopurinol 300mg  tablet. Take 1 tablet daily. This is to help reduce the amount of uric acid in body. Once the medication runs out - have it refilled. You have 3 refills on this medication.  Acyclovir 400mg  tablet. Take 1 tablet daily. This is an anti-viral drug. This will help protect your body  against certain viruses like the virus that causes fever blisters. Take this medication daily and once the medication runs out - have it refilled. You have 3 refills on this medication.   EMLA cream. Apply a quarter size amount to port site 1 hour prior to chemo. Do not rub in. Cover with plastic wrap.  .  Tylenol 325mg  tablet. Take 2 tablets 1 hour prior to Rituxan treatment. (Day 1 of chemo)  Benadryl 25mg  tablet. Take 2 tablets 1 hour prior to [facility] treatment. (Day 1 of chemo)  Compazine (prochlorperazine) 10mg  tablet. Take 1 tablet every 6 hours if needed for nausea/vomiting.  Over-the-Counter Meds:   Colace - this is a stool softener. Take 100mg  capsule 2-6 times a day as needed. If you have to take more than 6 capsules of Colace a day call the [facility].  Senna - this is a mild  laxative used to treat mild constipation. May take 2 tabs by mouth daily or up to twice a day as needed for mild constipation.  Milk of Magnesia - this is a laxative used to treat moderate to severe constipation. May take 2-4 tablespoons every 8 hours as needed. May increase to 8 tablespoons x 1 dose and if no bowel movement call the [facility].  Imodium - this is for diarrhea. Take 2 tabs after 1st loose stool and then 1 tab after each loose stool until you go a total of 12 hours without a loose stool. Call [facility] if loose stools continue.     SYMPTOMS TO REPORT AS SOON AS POSSIBLE AFTER TREATMENT:  FEVER GREATER THAN 100.5 F  CHILLS WITH OR WITHOUT FEVER  NAUSEA AND VOMITING THAT IS NOT CONTROLLED WITH YOUR NAUSEA MEDICATION  UNUSUAL SHORTNESS OF BREATH  UNUSUAL BRUISING OR BLEEDING  TENDERNESS IN MOUTH AND THROAT WITH OR WITHOUT PRESENCE OF ULCERS  URINARY PROBLEMS  BOWEL PROBLEMS  UNUSUAL RASH    Wear comfortable clothing and clothing appropriate for easy access to any Portacath or PICC line. Let us know if there is anything that we can do to make your therapy better!      I have been informed and understand all of the instructions given to me and have received a copy. I have been instructed to call the clinic [phone] or my family physician as soon as possible for continued medical care, if indicated. I do not have any more questions at this time but understand that I may call the [facility] or the Patient Navigator at [phone] during office hours should I have questions or need assistance in obtaining follow-up care.      _________________________________________      _______________     __________ Signature of Patient or Authorized Representative        Date                            Time      _________________________________________ Nurse's Signature      Rituximab injection What is this medicine? RITUXIMAB (ri TUX i mab) is a  monoclonal antibody. This medicine changes the way the body's immune system works. It is used commonly to treat non-Hodgkin's lymphoma and other conditions. In cancer cells, this drug targets a specific protein within cancer cells and stops the cancer cells from growing. It is also used to treat rhuematoid arthritis (RA). In RA, this medicine slow the inflammatory process and help reduce joint pain and  swelling. This medicine is often used with other cancer or arthritis medications. This medicine may be used for other purposes; ask your health care provider or pharmacist if you have questions. COMMON BRAND NAME(S): Rituxan What should I tell my health care provider before I take this medicine? They need to know if you have any of these conditions: -blood disorders -heart disease -history of hepatitis B -infection (especially a virus infection such as chickenpox, cold sores, or herpes) -irregular heartbeat -kidney disease -lung or breathing disease, like asthma -lupus -an unusual or allergic reaction to rituximab, mouse proteins, other medicines, foods, dyes, or preservatives -pregnant or trying to get pregnant -breast-feeding How should I use this medicine? This medicine is for infusion into a vein. It is administered in a hospital or clinic by a specially trained health care professional. A special MedGuide will be given to you by the pharmacist with each prescription and refill. Be sure to read this information carefully each time. Talk to your pediatrician regarding the use of this medicine in children. This medicine is not approved for use in children. Overdosage: If you think you have taken too much of this medicine contact a poison control center or emergency room at once. NOTE: This medicine is only for you. Do not share this medicine with others. What if I miss a dose? It is important not to miss a dose. Call your doctor or health care professional if you are unable to keep an  appointment. What may interact with this medicine? -cisplatin -medicines for blood pressure -some other medicines for arthritis -vaccines This list may not describe all possible interactions. Give your health care provider a list of all the medicines, herbs, non-prescription drugs, or dietary supplements you use. Also tell them if you smoke, drink alcohol, or use illegal drugs. Some items may interact with your medicine. What should I watch for while using this medicine? Report any side effects that you notice during your treatment right away, such as changes in your breathing, fever, chills, dizziness or lightheadedness. These effects are more common with the first dose. Visit your prescriber or health care professional for checks on your progress. You will need to have regular blood work. Report any other side effects. The side effects of this medicine can continue after you finish your treatment. Continue your course of treatment even though you feel ill unless your doctor tells you to stop. Call your doctor or health care professional for advice if you get a fever, chills or sore throat, or other symptoms of a cold or flu. Do not treat yourself. This drug decreases your body's ability to fight infections. Try to avoid being around people who are sick. This medicine may increase your risk to bruise or bleed. Call your doctor or health care professional if you notice any unusual bleeding. Be careful brushing and flossing your teeth or using a toothpick because you may get an infection or bleed more easily. If you have any dental work done, tell your dentist you are receiving this medicine. Avoid taking products that contain aspirin, acetaminophen, ibuprofen, naproxen, or ketoprofen unless instructed by your doctor. These medicines may hide a fever. Do not become pregnant while taking this medicine. Women should inform their doctor if they wish to become pregnant or think they might be pregnant. There  is a potential for serious side effects to an unborn child. Talk to your health care professional or pharmacist for more information. Do not breast-feed an infant while taking this medicine. What  side effects may I notice from receiving this medicine? Side effects that you should report to your doctor or health care professional as soon as possible: -allergic reactions like skin rash, itching or hives, swelling of the face, lips, or tongue -low blood counts - this medicine may decrease the number of white blood cells, red blood cells and platelets. You may be at increased risk for infections and bleeding. -signs of infection - fever or chills, cough, sore throat, pain or difficulty passing urine -signs of decreased platelets or bleeding - bruising, pinpoint red spots on the skin, black, tarry stools, blood in the urine -signs of decreased red blood cells - unusually weak or tired, fainting spells, lightheadedness -breathing problems -confused, not responsive -chest pain -fast, irregular heartbeat -feeling faint or lightheaded, falls -mouth sores -redness, blistering, peeling or loosening of the skin, including inside the mouth -stomach pain -swelling of the ankles, feet, or hands -trouble passing urine or change in the amount of urine Side effects that usually do not require medical attention (report to your doctor or other health care professional if they continue or are bothersome): -anxiety -headache -loss of appetite -muscle aches -nausea -night sweats This list may not describe all possible side effects. Call your doctor for medical advice about side effects. You may report side effects to FDA at 1-800-FDA-1088. Where should I keep my medicine? This drug is given in a hospital or clinic and will not be stored at home. NOTE: This sheet is a summary. It may not cover all possible information. If you have questions about this medicine, talk to your doctor, pharmacist, or health care  provider.  2014, Elsevier/Gold Standard. (2008-04-15 14:04:59) Bendamustine Injection What is this medicine? BENDAMUSTINE (BEN da MUS teen) is a chemotherapy drug. It is used to treat chronic lymphocytic leukemia and non-Hodgkin lymphoma. This medicine may be used for other purposes; ask your health care provider or pharmacist if you have questions. COMMON BRAND NAME(S): Treanda What should I tell my health care provider before I take this medicine? They need to know if you have any of these conditions: -kidney disease -liver disease -an unusual or allergic reaction to bendamustine, mannitol, other medicines, foods, dyes, or preservatives -pregnant or trying to get pregnant -breast-feeding How should I use this medicine? This medicine is for infusion into a vein. It is given by a health care professional in a hospital or clinic setting. Talk to your pediatrician regarding the use of this medicine in children. Special care may be needed. Overdosage: If you think you have taken too much of this medicine contact a poison control center or emergency room at once. NOTE: This medicine is only for you. Do not share this medicine with others. What if I miss a dose? It is important not to miss your dose. Call your doctor or health care professional if you are unable to keep an appointment. What may interact with this medicine? Do not take this medicine with any of the following medications: -clozapine This medicine may also interact with the following medications: -atazanavir -cimetidine -ciprofloxacin -enoxacin -fluvoxamine -medicines for seizures like carbamazepine and phenobarbital -mexiletine -rifampin -tacrine -thiabendazole -zileuton This list may not describe all possible interactions. Give your health care provider a list of all the medicines, herbs, non-prescription drugs, or dietary supplements you use. Also tell them if you smoke, drink alcohol, or use illegal drugs. Some items  may interact with your medicine. What should I watch for while using this medicine? Your condition will be monitored  carefully while you are receiving this medicine. This drug may make you feel generally unwell. This is not uncommon, as chemotherapy can affect healthy cells as well as cancer cells. Report any side effects. Continue your course of treatment even though you feel ill unless your doctor tells you to stop. Call your doctor or health care professional for advice if you get a fever, chills or sore throat, or other symptoms of a cold or flu. Do not treat yourself. This drug decreases your body's ability to fight infections. Try to avoid being around people who are sick. This medicine may increase your risk to bruise or bleed. Call your doctor or health care professional if you notice any unusual bleeding. Be careful brushing and flossing your teeth or using a toothpick because you may get an infection or bleed more easily. If you have any dental work done, tell your dentist you are receiving this medicine. Avoid taking products that contain aspirin, acetaminophen, ibuprofen, naproxen, or ketoprofen unless instructed by your doctor. These medicines may hide a fever. Do not become pregnant while taking this medicine. Women should inform their doctor if they wish to become pregnant or think they might be pregnant. There is a potential for serious side effects to an unborn child. Men should inform their doctors if they wish to father a child. This medicine may lower sperm counts. Talk to your health care professional or pharmacist for more information. Do not breast-feed an infant while taking this medicine. What side effects may I notice from receiving this medicine? Side effects that you should report to your doctor or health care professional as soon as possible: -allergic reactions like skin rash, itching or hives, swelling of the face, lips, or tongue -low blood counts - this medicine may  decrease the number of white blood cells, red blood cells and platelets. You may be at increased risk for infections and bleeding. -signs of infection - fever or chills, cough, sore throat, pain or difficulty passing urine -signs of decreased platelets or bleeding - bruising, pinpoint red spots on the skin, black, tarry stools, blood in the urine -signs of decreased red blood cells - unusually weak or tired, fainting spells, lightheadedness -trouble passing urine or change in the amount of urine Side effects that usually do not require medical attention (report to your doctor or health care professional if they continue or are bothersome): -diarrhea This list may not describe all possible side effects. Call your doctor for medical advice about side effects. You may report side effects to FDA at 1-800-FDA-1088. Where should I keep my medicine? This drug is given in a hospital or clinic and will not be stored at home. NOTE: This sheet is a summary. It may not cover all possible information. If you have questions about this medicine, talk to your doctor, pharmacist, or health care provider.  2014, Elsevier/Gold Standard. (2011-11-12 14:15:47) Rasburicase Injection What is this medicine? RASBURICASE (ras BURE i kase) breaks down uric acid in the blood. It is used to prevent and to treat high levels of uric acid caused by cancer treatment. This medicine may be used for other purposes; ask your health care provider or pharmacist if you have questions. COMMON BRAND NAME(S): Elitek What should I tell my health care provider before I take this medicine? They need to know if you have any of these conditions: -G6PD deficiency -history of anemia -history of blood transfusion -an unusual or allergic reaction to rasburicase, yeast, mannitol, other medicines, foods, dyes, or  preservatives -pregnant or trying to get pregnant -breast-feeding How should I use this medicine? This medicine is for infusion  into a vein. It is given by a health care professional in a hospital or clinic setting. Talk to your pediatrician regarding the use of this medicine in children. While this drug may be prescribed for children as young as 72 month old for selected conditions, precautions do apply. Overdosage: If you think you have taken too much of this medicine contact a poison control center or emergency room at once. NOTE: This medicine is only for you. Do not share this medicine with others. What if I miss a dose? This does not apply. What may interact with this medicine? -allopurinol This list may not describe all possible interactions. Give your health care provider a list of all the medicines, herbs, non-prescription drugs, or dietary supplements you use. Also tell them if you smoke, drink alcohol, or use illegal drugs. Some items may interact with your medicine. What should I watch for while using this medicine? Your condition will be monitored carefully while you are receiving this medicine. You will need to have regular blood tests during your treatment. What side effects may I notice from receiving this medicine? Side effects that you should report to your doctor or health care professional as soon as possible: -allergic reactions like skin rash, itching or hives, swelling of the face, lips, or tongue -blue color to lips or nailbeds -breathing problems -chest pain, tightness -fast, irregular heartbeat -feeling faint or lightheaded, falls -fever -low blood pressure -seizures -trouble passing urine or change in the amount of urine -yellowing of the eyes or skin Side effects that usually do not require medical attention (report to your doctor or health care professional if they continue or are bothersome): -constipation or diarrhea -diarrhea -headache -mouth sores -nausea, vomiting This list may not describe all possible side effects. Call your doctor for medical advice about side effects. You may  report side effects to FDA at 1-800-FDA-1088. Where should I keep my medicine? This drug is given in a hospital or clinic and will not be stored at home. NOTE: This sheet is a summary. It may not cover all possible information. If you have questions about this medicine, talk to your doctor, pharmacist, or health care provider.  2014, Elsevier/Gold Standard. (2008-04-15 14:22:08) Allopurinol tablets What is this medicine? ALLOPURINOL (al oh PURE i nole) reduces the amount of uric acid the body makes. It is used to treat the symptoms of gout. It is also used to treat or prevent high uric acid levels that occur as a result of certain types of chemotherapy. This medicine may also help patients who frequently have kidney stones. This medicine may be used for other purposes; ask your health care provider or pharmacist if you have questions. COMMON BRAND NAME(S): Zyloprim What should I tell my health care provider before I take this medicine? They need to know if you have any of these conditions: -kidney or liver disease -an unusual or allergic reaction to allopurinol, other medicines, foods, dyes, or preservatives -pregnant or trying to get pregnant -breast feeding How should I use this medicine? Take this medicine by mouth with a glass of water. Follow the directions on the prescription label. If this medicine upsets your stomach, take it with food or milk. Take your doses at regular intervals. Do not take your medicine more often than directed. Talk to your pediatrician regarding the use of this medicine in children. Special care may be  needed. While this drug may be prescribed for children as young as 6 years for selected conditions, precautions do apply. Overdosage: If you think you have taken too much of this medicine contact a poison control center or emergency room at once. NOTE: This medicine is only for you. Do not share this medicine with others. What if I miss a dose? If you miss a dose,  take it as soon as you can. If it is almost time for your next dose, take only that dose. Do not take double or extra doses. What may interact with this medicine? Do not take this medicine with the following medication: -didanosine, ddI This medicine may also interact with the following medications: -amoxicillin or ampicillin -azathioprine -certain medicines used to treat gout -certain types of diuretics -chlorpropamide -cyclosporine -dicumarol -mercaptopurine -tolbutamide -warfarin This list may not describe all possible interactions. Give your health care provider a list of all the medicines, herbs, non-prescription drugs, or dietary supplements you use. Also tell them if you smoke, drink alcohol, or use illegal drugs. Some items may interact with your medicine. What should I watch for while using this medicine? Visit your doctor or health care professional for regular checks on your progress. If you are taking this medicine to treat gout, you may not have less frequent attacks at first. Keep taking your medicine regularly and the attacks should get better within 2 to 6 weeks. Drink plenty of water (10 to 12 full glasses a day) while you are taking this medicine. This will help to reduce stomach upset and reduce the risk of getting gout or kidney stones. Call your doctor or health care professional at once if you get a skin rash together with chills, fever, sore throat, or nausea and vomiting, if you have blood in your urine, or difficulty passing urine. Do not take vitamin C without asking your doctor or health care professional. Too much vitamin C can increase the chance of getting kidney stones. You may get drowsy or dizzy. Do not drive, use machinery, or do anything that needs mental alertness until you know how this drug affects you. Do not stand or sit up quickly, especially if you are an older patient. This reduces the risk of dizzy or fainting spells. Alcohol can make you more drowsy  and dizzy. Alcohol can also increase the chance of stomach problems and increase the amount of uric acid in your blood. Avoid alcoholic drinks. What side effects may I notice from receiving this medicine? Side effects that you should report to your doctor or health care professional as soon as possible: -allergic reactions like skin rash, itching or hives, swelling of the face, lips, or tongue -breathing problems -muscle aches or pains -redness, blistering, peeling or loosening of the skin, including inside the mouth Side effects that usually do not require medical attention (report to your doctor or health care professional if they continue or are bothersome): -changes in taste -diarrhea -indigestion -stomach pain or cramps This list may not describe all possible side effects. Call your doctor for medical advice about side effects. You may report side effects to FDA at 1-800-FDA-1088. Where should I keep my medicine? Keep out of the reach of children. Store at room temperature between 15 and 25 degrees C (59 and 77 degrees F). Protect from light and moisture. Throw away any unused medicine after the expiration date. NOTE: This sheet is a summary. It may not cover all possible information. If you have questions about this medicine, talk  to your doctor, pharmacist, or health care provider.  2014, [employer]. (2008-02-19 14:26:54) Acyclovir tablets or capsules What is this medicine? ACYCLOVIR (ay SYE kloe veer) is an antiviral medicine. It is used to treat or prevent infections caused by certain kinds of viruses. Examples of these infections include herpes and shingles. This medicine will not cure herpes. This medicine may be used for other purposes; ask your health care provider or pharmacist if you have questions. COMMON BRAND NAME(S): Zovirax What should I tell my health care provider before I take this medicine? They need to know if you have any of these conditions: -kidney  disease -an unusual or allergic reaction to acyclovir, ganciclovir, valacyclovir, other medicines, foods, dyes, or preservatives -pregnant or trying to get pregnant -breast-feeding How should I use this medicine? Take this medicine by mouth with a glass of water. Follow the directions on the prescription label. You can take it with or without food. Take your medicine at regular intervals. Do not take your medicine more often than directed. Take all of your medicine as directed even if you think your are better. Do not skip doses or stop your medicine early. Talk to your pediatrician regarding the use of this medicine in children. While this drug may be prescribed for selected conditions, precautions do apply. Overdosage: If you think you have taken too much of this medicine contact a poison control center or emergency room at once. NOTE: This medicine is only for you. Do not share this medicine with others. What if I miss a dose? If you miss a dose, take it as soon as you can. If it is almost time for your next dose, take only that dose. Do not take double or extra doses. What may interact with this medicine? -probenecid This list may not describe all possible interactions. Give your health care provider a list of all the medicines, herbs, non-prescription drugs, or dietary supplements you use. Also tell them if you smoke, drink alcohol, or use illegal drugs. Some items may interact with your medicine. What should I watch for while using this medicine? Tell your doctor or health care professional if your symptoms do not improve. This medicine works best when started very early in the course of an infection. Begin treatment at the first signs of infection. Drink 6 to 8 glasses of water or fluids every day while you are taking this medicine. This will help prevent side effects. You can still pass chickenpox, shingles, or herpes to another person even while you are taking this medicine. Avoid contact  with others as directed. Genital herpes is a sexually transmitted disease. Talk to your doctor about how to stop the spread of infection. What side effects may I notice from receiving this medicine? Side effects that you should report to your doctor or health care professional as soon as possible: -allergic reactions like skin rash, itching or hives, swelling of the face, lips, or tongue -chest pain -confusion, hallucinations, tremor -dark urine -increased sensitivity to the sun -redness, blistering, peeling or loosening of the skin, including inside the mouth -seizures -trouble passing urine or change in the amount of urine -unusual bleeding or bruising, or pinpoint red spots on the skin -unusually weak or tired -yellowing of the eyes or skin Side effects that usually do not require medical attention (report to your doctor or health care professional if they continue or are bothersome): -diarrhea -fever -headache -nausea, vomiting -stomach upset This list may not describe all possible side effects. Call  your doctor for medical advice about side effects. You may report side effects to FDA at 1-800-FDA-1088. Where should I keep my medicine? Keep out of the reach of children. Store at room temperature between 15 and 25 degrees C (59 and 77 degrees F). Throw away any unused medicine after the expiration date. NOTE: This sheet is a summary. It may not cover all possible information. If you have questions about this medicine, talk to your doctor, pharmacist, or health care provider.  2014, [employer] Standard. (2007-11-01 13:15:46) Prochlorperazine tablets What is this medicine? PROCHLORPERAZINE (proe klor PER a zeen) helps to control severe nausea and vomiting. This medicine is also used to treat schizophrenia. It can also help patients who experience anxiety that is not due to psychological illness. This medicine may be used for other purposes; ask your health care provider or  pharmacist if you have questions. COMMON BRAND NAME(S): Compazine What should I tell my health care provider before I take this medicine? They need to know if you have any of these conditions: -blood disorders or disease -dementia -liver disease or jaundice -Parkinson's disease -uncontrollable movement disorder -an unusual or allergic reaction to prochlorperazine, other medicines, foods, dyes, or preservatives -pregnant or trying to get pregnant -breast-feeding How should I use this medicine? Take this medicine by mouth with a glass of water. Follow the directions on the prescription label. Take your doses at regular intervals. Do not take your medicine more often than directed. Do not stop taking this medicine suddenly. This can cause nausea, vomiting, and dizziness. Ask your doctor or health care professional for advice. Talk to your pediatrician regarding the use of this medicine in children. Special care may be needed. While this drug may be prescribed for children as young as 2 years for selected conditions, precautions do apply. Overdosage: If you think you have taken too much of this medicine contact a poison control center or emergency room at once. NOTE: This medicine is only for you. Do not share this medicine with others. What if I miss a dose? If you miss a dose, take it as soon as you can. If it is almost time for your next dose, take only that dose. Do not take double or extra doses. What may interact with this medicine? Do not take this medicine with any of the following medications: -amoxapine -antidepressants like citalopram, escitalopram, fluoxetine, paroxetine, and sertraline -deferoxamine -dofetilide -maprotiline -tricyclic antidepressants like amitriptyline, clomipramine, imipramine, nortiptyline and others This medicine may also interact with the following medications: -lithium -medicines for pain -phenytoin -propranolol -warfarin This list may not describe all  possible interactions. Give your health care provider a list of all the medicines, herbs, non-prescription drugs, or dietary supplements you use. Also tell them if you smoke, drink alcohol, or use illegal drugs. Some items may interact with your medicine. What should I watch for while using this medicine? Visit your doctor or health care professional for regular checks on your progress. You may get drowsy or dizzy. Do not drive, use machinery, or do anything that needs mental alertness until you know how this medicine affects you. Do not stand or sit up quickly, especially if you are an older patient. This reduces the risk of dizzy or fainting spells. Alcohol may interfere with the effect of this medicine. Avoid alcoholic drinks. This medicine can reduce the response of your body to heat or cold. Dress warm in cold weather and stay hydrated in hot weather. If possible, avoid extreme temperatures like saunas, hot  tubs, very hot or cold showers, or activities that can cause dehydration such as vigorous exercise. This medicine can make you more sensitive to the sun. Keep out of the sun. If you cannot avoid being in the sun, wear protective clothing and use sunscreen. Do not use sun lamps or tanning beds/booths. Your mouth may get dry. Chewing sugarless gum or sucking hard candy, and drinking plenty of water may help. Contact your doctor if the problem does not go away or is severe. What side effects may I notice from receiving this medicine? Side effects that you should report to your doctor or health care professional as soon as possible: -blurred vision -breast enlargement in men or women -breast milk in women who are not breast-feeding -chest pain, fast or irregular heartbeat -confusion, restlessness -dark yellow or brown urine -difficulty breathing or swallowing -dizziness or fainting spells -drooling, shaking, movement difficulty (shuffling walk) or rigidity -fever, chills, sore  throat -involuntary or uncontrollable movements of the eyes, mouth, head, arms, and legs -seizures -stomach area pain -unusually weak or tired -unusual bleeding or bruising -yellowing of skin or eyes Side effects that usually do not require medical attention (report to your doctor or health care professional if they continue or are bothersome): -difficulty passing urine -difficulty sleeping -headache -sexual dysfunction -skin rash, or itching This list may not describe all possible side effects. Call your doctor for medical advice about side effects. You may report side effects to FDA at 1-800-FDA-1088. Where should I keep my medicine? Keep out of the reach of children. Store at room temperature between 15 and 30 degrees C (59 and 86 degrees F). Protect from light. Throw away any unused medicine after the expiration date. NOTE: This sheet is a summary. It may not cover all possible information. If you have questions about this medicine, talk to your doctor, pharmacist, or health care provider.  2014, [employer] Standard. (2012-01-04 16:59:39) Pegfilgrastim injection What is this medicine? PEGFILGRASTIM (peg fil GRA stim) helps the body make more white blood cells. It is used to prevent infection in people with low amounts of white blood cells following cancer treatment. This medicine may be used for other purposes; ask your health care provider or pharmacist if you have questions. COMMON BRAND NAME(S): Neulasta What should I tell my health care provider before I take this medicine? They need to know if you have any of these conditions: -sickle cell disease -an unusual or allergic reaction to pegfilgrastim, filgrastim, E.coli protein, other medicines, foods, dyes, or preservatives -pregnant or trying to get pregnant -breast-feeding How should I use this medicine? This medicine is for injection under the skin. It is usually given by a health care professional in a hospital or clinic  setting. If you get this medicine at home, you will be taught how to prepare and give this medicine. Do not shake this medicine. Use exactly as directed. Take your medicine at regular intervals. Do not take your medicine more often than directed. It is important that you put your used needles and syringes in a special sharps container. Do not put them in a trash can. If you do not have a sharps container, call your pharmacist or healthcare provider to get one. Talk to your pediatrician regarding the use of this medicine in children. While this drug may be prescribed for children who weigh more than 45 kg for selected conditions, precautions do apply Overdosage: If you think you have taken too much of this medicine contact a poison control center  or emergency room at once. NOTE: This medicine is only for you. Do not share this medicine with others. What if I miss a dose? If you miss a dose, take it as soon as you can. If it is almost time for your next dose, take only that dose. Do not take double or extra doses. What may interact with this medicine? -lithium -medicines for growth therapy This list may not describe all possible interactions. Give your health care provider a list of all the medicines, herbs, non-prescription drugs, or dietary supplements you use. Also tell them if you smoke, drink alcohol, or use illegal drugs. Some items may interact with your medicine. What should I watch for while using this medicine? Visit your doctor for regular check ups. You will need important blood work done while you are taking this medicine. What side effects may I notice from receiving this medicine? Side effects that you should report to your doctor or health care professional as soon as possible: -allergic reactions like skin rash, itching or hives, swelling of the face, lips, or tongue -breathing problems -fever -pain, redness, or swelling where injected -shoulder pain -stomach or side pain Side  effects that usually do not require medical attention (report to your doctor or health care professional if they continue or are bothersome): -aches, pains -headache -loss of appetite -nausea, vomiting -unusually tired This list may not describe all possible side effects. Call your doctor for medical advice about side effects. You may report side effects to FDA at 1-800-FDA-1088. Where should I keep my medicine? Keep out of the reach of children. Store in a refrigerator between 2 and 8 degrees C (36 and 46 degrees F). Do not freeze. Keep in carton to protect from light. Throw away this medicine if it is left out of the refrigerator for more than 48 hours. Throw away any unused medicine after the expiration date. NOTE: This sheet is a summary. It may not cover all possible information. If you have questions about this medicine, talk to your doctor, pharmacist, or health care provider.  2014, [employer] Standard. (2008-03-18 15:41:44) Acetaminophen tablets or caplets What is this medicine? ACETAMINOPHEN (a set a MEE noe fen) is a pain reliever. It is used to treat mild pain and fever. This medicine may be used for other purposes; ask your health care provider or pharmacist if you have questions. COMMON BRAND NAME(S): Aceta, Actamin, Anacin Aspirin Free, Genapap, Genebs, Mapap, Pain & Fever , Pain and Fever , PAIN RELIEF , PAIN RELIEF Extra Strength, Pain Reliever, Panadol, Q-Pap Extra Strength, Q-Pap, Tylenol CrushableTablet, Tylenol Extra Strength, Tylenol, XS No Aspirin, XS Pain Reliever What should I tell my health care provider before I take this medicine? They need to know if you have any of these conditions: -if you often drink alcohol -liver disease -an unusual or allergic reaction to acetaminophen, other medicines, foods, dyes, or preservatives -pregnant or trying to get pregnant -breast-feeding How should I use this medicine? Take this medicine by mouth with a glass of water.  Follow the directions on the package or prescription label. Take your medicine at regular intervals. Do not take your medicine more often than directed. Talk to your pediatrician regarding the use of this medicine in children. While this drug may be prescribed for children as young as 53 years of age for selected conditions, precautions do apply. Overdosage: If you think you have taken too much of this medicine contact a poison control center or emergency room at once. NOTE:  This medicine is only for you. Do not share this medicine with others. What if I miss a dose? If you miss a dose, take it as soon as you can. If it is almost time for your next dose, take only that dose. Do not take double or extra doses. What may interact with this medicine? -alcohol -imatinib -isoniazid -other medicines with acetaminophen This list may not describe all possible interactions. Give your health care provider a list of all the medicines, herbs, non-prescription drugs, or dietary supplements you use. Also tell them if you smoke, drink alcohol, or use illegal drugs. Some items may interact with your medicine. What should I watch for while using this medicine? Tell your doctor or health care professional if the pain lasts more than 10 days (5 days for children), if it gets worse, or if there is a new or different kind of pain. Also, check with your doctor if a fever lasts for more than 3 days. Do not take other medicines that contain acetaminophen with this medicine. Always read labels carefully. If you have questions, ask your doctor or pharmacist. If you take too much acetaminophen get medical help right away. Too much acetaminophen can be very dangerous and cause liver damage. Even if you do not have symptoms, it is important to get help right away. What side effects may I notice from receiving this medicine? Side effects that you should report to your doctor or health care professional as soon as  possible: -allergic reactions like skin rash, itching or hives, swelling of the face, lips, or tongue -breathing problems -fever or sore throat -redness, blistering, peeling or loosening of the skin, including inside the mouth -trouble passing urine or change in the amount of urine -unusual bleeding or bruising -unusually weak or tired -yellowing of the eyes or skin Side effects that usually do not require medical attention (report to your doctor or health care professional if they continue or are bothersome): -headache -nausea, stomach upset This list may not describe all possible side effects. Call your doctor for medical advice about side effects. You may report side effects to FDA at 1-800-FDA-1088. Where should I keep my medicine? Keep out of reach of children. Store at room temperature between 20 and 25 degrees C (68 and 77 degrees F). Protect from moisture and heat. Throw away any unused medicine after the expiration date. NOTE: This sheet is a summary. It may not cover all possible information. If you have questions about this medicine, talk to your doctor, pharmacist, or health care provider.  2014, [employer]. (2013-04-09 12:54:16) Diphenhydramine capsules or tablets What is this medicine? DIPHENHYDRAMINE (dye fen HYE dra meen) is an antihistamine. It is used to treat the symptoms of an allergic reaction. It is also used to treat Parkinson's disease. This medicine is also used to prevent and to treat motion sickness and as a nighttime sleep aid. This medicine may be used for other purposes; ask your health care provider or pharmacist if you have questions. COMMON BRAND NAME(S): Alka-Seltzer Plus Allergy, Banophen , Benadryl Allergy Dye Free, Benadryl Allergy Kapgel, Benadryl Allergy Ultratab, Benadryl Allergy, Diphedryl , Diphenhist, Genahist , Q-Dryl, Gretta Began, Valu-Dryl , Vicks ZzzQuil Nightime Sleep-Aid What should I tell my health care provider before I take this  medicine? They need to know if you have any of these conditions: -asthma or lung disease -glaucoma -high blood pressure or heart disease -liver disease -pain or difficulty passing urine -prostate trouble -ulcers or other stomach problems -an  unusual or allergic reaction to diphenhydramine, other medicines foods, dyes, or preservatives such as sulfites -pregnant or trying to get pregnant -breast-feeding How should I use this medicine? Take this medicine by mouth with a full glass of water. Follow the directions on the prescription label. Take your doses at regular intervals. Do not take your medicine more often than directed. To prevent motion sickness start taking this medicine 30 to 60 minutes before you leave. Talk to your pediatrician regarding the use of this medicine in children. Special care may be needed. Patients over 68 years old may have a stronger reaction and need a smaller dose. Overdosage: If you think you have taken too much of this medicine contact a poison control center or emergency room at once. NOTE: This medicine is only for you. Do not share this medicine with others. What if I miss a dose? If you miss a dose, take it as soon as you can. If it is almost time for your next dose, take only that dose. Do not take double or extra doses. What may interact with this medicine? Do not take this medicine with any of the following medications: -MAOIs like Carbex, Eldepryl, Marplan, Nardil, and Parnate This medicine may also interact with the following medications: -alcohol -barbiturates, like phenobarbital -medicines for bladder spasm like oxybutynin, tolterodine -medicines for blood pressure -medicines for depression, anxiety, or psychotic disturbances -medicines for movement abnormalities or Parkinson's disease -medicines for sleep -other medicines for cold, cough or allergy -some medicines for the stomach like chlordiazepoxide, dicyclomine This list may not describe  all possible interactions. Give your health care provider a list of all the medicines, herbs, non-prescription drugs, or dietary supplements you use. Also tell them if you smoke, drink alcohol, or use illegal drugs. Some items may interact with your medicine. What should I watch for while using this medicine? Visit your doctor or health care professional for regular check ups. Tell your doctor if your symptoms do not improve or if they get worse. Your mouth may get dry. Chewing sugarless gum or sucking hard candy, and drinking plenty of water may help. Contact your doctor if the problem does not go away or is severe. This medicine may cause dry eyes and blurred vision. If you wear contact lenses you may feel some discomfort. Lubricating drops may help. See your eye doctor if the problem does not go away or is severe. You may get drowsy or dizzy. Do not drive, use machinery, or do anything that needs mental alertness until you know how this medicine affects you. Do not stand or sit up quickly, especially if you are an older patient. This reduces the risk of dizzy or fainting spells. Alcohol may interfere with the effect of this medicine. Avoid alcoholic drinks. What side effects may I notice from receiving this medicine? Side effects that you should report to your doctor or health care professional as soon as possible: -allergic reactions like skin rash, itching or hives, swelling of the face, lips, or tongue -changes in vision -confused, agitated, nervous -irregular or fast heartbeat -tremor -trouble passing urine -unusual bleeding or bruising -unusually weak or tired Side effects that usually do not require medical attention (report to your doctor or health care professional if they continue or are bothersome): -constipation, diarrhea -drowsy -headache -loss of appetite -stomach upset, vomiting -thick mucous This list may not describe all possible side effects. Call your doctor for medical  advice about side effects. You may report side effects to FDA at  1-800-FDA-1088. Where should I keep my medicine? Keep out of the reach of children. Store at room temperature between 15 and 30 degrees C (59 and 86 degrees F). Keep container closed tightly. Throw away any unused medicine after the expiration date. NOTE: This sheet is a summary. It may not cover all possible information. If you have questions about this medicine, talk to your doctor, pharmacist, or health care provider.  2014, Elsevier/Gold Standard. (2007-12-04 17:06:22) Dexamethasone injection What is this medicine? DEXAMETHASONE (dex a METH a sone) is a corticosteroid. It is used to treat inflammation of the skin, joints, lungs, and other organs. Common conditions treated include asthma, allergies, and arthritis. It is also used for other conditions, like blood disorders and diseases of the adrenal glands. This medicine may be used for other purposes; ask your health care provider or pharmacist if you have questions. COMMON BRAND NAME(S): Decadron, Solurex What should I tell my health care provider before I take this medicine? They need to know if you have any of these conditions: -blood clotting problems -Cushing's syndrome -diabetes -glaucoma -heart problems or disease -high blood pressure -infection like herpes, measles, tuberculosis, or chickenpox -kidney disease -liver disease -mental problems -myasthenia gravis -osteoporosis -previous heart attack -seizures -stomach, ulcer or intestine disease including colitis and diverticulitis -thyroid problem -an unusual or allergic reaction to dexamethasone, corticosteroids, other medicines, lactose, foods, dyes, or preservatives -pregnant or trying to get pregnant -breast-feeding How should I use this medicine? This medicine is for injection into a muscle, joint, lesion, soft tissue, or vein. It is given by a health care professional in a hospital or clinic  setting. Talk to your pediatrician regarding the use of this medicine in children. Special care may be needed. Overdosage: If you think you have taken too much of this medicine contact a poison control center or emergency room at once. NOTE: This medicine is only for you. Do not share this medicine with others. What if I miss a dose? This may not apply. If you are having a series of injections over a prolonged period, try not to miss an appointment. Call your doctor or health care professional to reschedule if you are unable to keep an appointment. What may interact with this medicine? Do not take this medicine with any of the following medications: -mifepristone, RU-486 -vaccines This medicine may also interact with the following medications: -amphotericin B -antibiotics like clarithromycin, erythromycin, and troleandomycin -aspirin and aspirin-like drugs -barbiturates like phenobarbital -carbamazepine -cholestyramine -cholinesterase inhibitors like donepezil, galantamine, rivastigmine, and tacrine -cyclosporine -digoxin -diuretics -ephedrine -male hormones, like estrogens or progestins and birth control pills -indinavir -isoniazid -ketoconazole -medicines for diabetes -medicines that improve muscle tone or strength for conditions like myasthenia gravis -NSAIDs, medicines for pain and inflammation, like ibuprofen or naproxen -phenytoin -rifampin -thalidomide -warfarin This list may not describe all possible interactions. Give your health care provider a list of all the medicines, herbs, non-prescription drugs, or dietary supplements you use. Also tell them if you smoke, drink alcohol, or use illegal drugs. Some items may interact with your medicine. What should I watch for while using this medicine? Your condition will be monitored carefully while you are receiving this medicine. If you are taking this medicine for a long time, carry an identification card with your name and  address, the type and dose of your medicine, and your doctor's name and address. This medicine may increase your risk of getting an infection. Stay away from people who are sick. Tell your doctor or health  care professional if you are around anyone with measles or chickenpox. Talk to your health care provider before you get any vaccines that you take this medicine. If you are going to have surgery, tell your doctor or health care professional that you have taken this medicine within the last twelve months. Ask your doctor or health care professional about your diet. You may need to lower the amount of salt you eat. The medicine can increase your blood sugar. If you are a diabetic check with your doctor if you need help adjusting the dose of your diabetic medicine. What side effects may I notice from receiving this medicine? Side effects that you should report to your doctor or health care professional as soon as possible: -allergic reactions like skin rash, itching or hives, swelling of the face, lips, or tongue -black or tarry stools -change in the amount of urine -changes in vision -confusion, excitement, restlessness, a false sense of well-being -fever, sore throat, sneezing, cough, or other signs of infection, wounds that will not heal -hallucinations -increased thirst -mental depression, mood swings, mistaken feelings of self importance or of being mistreated -pain in hips, back, ribs, arms, shoulders, or legs -pain, redness, or irritation at the injection site -redness, blistering, peeling or loosening of the skin, including inside the mouth -rounding out of face -swelling of feet or lower legs -unusual bleeding or bruising -unusual tired or weak -wounds that do not heal Side effects that usually do not require medical attention (report to your doctor or health care professional if they continue or are bothersome): -diarrhea or constipation -change in taste -headache -nausea,  vomiting -skin problems, acne, thin and shiny skin -touble sleeping -unusual growth of hair on the face or body -weight gain This list may not describe all possible side effects. Call your doctor for medical advice about side effects. You may report side effects to FDA at 1-800-FDA-1088. Where should I keep my medicine? This drug is given in a hospital or clinic and will not be stored at home. NOTE: This sheet is a summary. It may not cover all possible information. If you have questions about this medicine, talk to your doctor, pharmacist, or health care provider.  2014, Elsevier/Gold Standard. (2007-12-07 14:04:12) Ondansetron injection What is this medicine? ONDANSETRON (on DAN se tron) is used to treat nausea and vomiting caused by chemotherapy. It is also used to prevent or treat nausea and vomiting after surgery. This medicine may be used for other purposes; ask your health care provider or pharmacist if you have questions. COMMON BRAND NAME(S): Zofran What should I tell my health care provider before I take this medicine? They need to know if you have any of these conditions: -heart disease -history of irregular heartbeat -liver disease -low levels of magnesium or potassium in the blood -an unusual or allergic reaction to ondansetron, granisetron, other medicines, foods, dyes, or preservatives -pregnant or trying to get pregnant -breast-feeding How should I use this medicine? This medicine is for infusion into a vein. It is given by a health care professional in a hospital or clinic setting. Talk to your pediatrician regarding the use of this medicine in children. Special care may be needed. Overdosage: If you think you have taken too much of this medicine contact a poison control center or emergency room at once. NOTE: This medicine is only for you. Do not share this medicine with others. What if I miss a dose? This does not apply. What may interact with this medicine? Do  not  take this medicine with any of the following medications: -apomorphine -certain medicines for fungal infections like fluconazole, itraconazole, ketoconazole, posaconazole, voriconazole -cisapride -dofetilide -dronedarone -pimozide -thioridazine -ziprasidone  This medicine may also interact with the following medications: -carbamazepine -certain medicines for depression, anxiety, or psychotic disturbances -fentanyl -linezolid -MAOIs like Carbex, Eldepryl, Marplan, Nardil, and Parnate -methylene blue (injected into a vein) -other medicines that prolong the QT interval (cause an abnormal heart rhythm) -phenytoin -rifampicin -tramadol This list may not describe all possible interactions. Give your health care provider a list of all the medicines, herbs, non-prescription drugs, or dietary supplements you use. Also tell them if you smoke, drink alcohol, or use illegal drugs. Some items may interact with your medicine. What should I watch for while using this medicine? Your condition will be monitored carefully while you are receiving this medicine. What side effects may I notice from receiving this medicine? Side effects that you should report to your doctor or health care professional as soon as possible: -allergic reactions like skin rash, itching or hives, swelling of the face, lips, or tongue -breathing problems -confusion -dizziness -fast or irregular heartbeat -feeling faint or lightheaded, falls -fever and chills -loss of balance or coordination -seizures -sweating -swelling of the hands and feet -tightness in the chest -tremors -unusually weak or tired Side effects that usually do not require medical attention (report to your doctor or health care professional if they continue or are bothersome): -constipation or diarrhea -headache This list may not describe all possible side effects. Call your doctor for medical advice about side effects. You may report side effects to  FDA at 1-800-FDA-1088. Where should I keep my medicine? This drug is given in a hospital or clinic and will not be stored at home. NOTE: This sheet is a summary. It may not cover all possible information. If you have questions about this medicine, talk to your doctor, pharmacist, or health care provider.  2014, Elsevier/Gold Standard. (2013-05-23 16:18:28)

## 2014-02-12 ENCOUNTER — Encounter (HOSPITAL_COMMUNITY): Payer: Self-pay

## 2014-02-12 ENCOUNTER — Ambulatory Visit (HOSPITAL_COMMUNITY)
Admission: RE | Admit: 2014-02-12 | Discharge: 2014-02-12 | Disposition: A | Discharge status: Home / Self Care | Payer: Medicare Other | Source: Ambulatory Visit | Attending: Hematology and Oncology | Admitting: Hematology and Oncology

## 2014-02-12 ENCOUNTER — Other Ambulatory Visit: Payer: Self-pay | Admitting: Pulmonary Disease

## 2014-02-12 ENCOUNTER — Other Ambulatory Visit (HOSPITAL_COMMUNITY): Payer: Self-pay | Admitting: Hematology and Oncology

## 2014-02-12 DIAGNOSIS — Z79899 Other long term (current) drug therapy: Secondary | ICD-10-CM | POA: Insufficient documentation

## 2014-02-12 DIAGNOSIS — C8319 Mantle cell lymphoma, extranodal and solid organ sites: Secondary | ICD-10-CM | POA: Insufficient documentation

## 2014-02-12 DIAGNOSIS — Z96659 Presence of unspecified artificial knee joint: Secondary | ICD-10-CM | POA: Insufficient documentation

## 2014-02-12 DIAGNOSIS — I2510 Atherosclerotic heart disease of native coronary artery without angina pectoris: Secondary | ICD-10-CM | POA: Insufficient documentation

## 2014-02-12 DIAGNOSIS — F411 Generalized anxiety disorder: Secondary | ICD-10-CM | POA: Insufficient documentation

## 2014-02-12 DIAGNOSIS — I10 Essential (primary) hypertension: Secondary | ICD-10-CM | POA: Insufficient documentation

## 2014-02-12 DIAGNOSIS — F17200 Nicotine dependence, unspecified, uncomplicated: Secondary | ICD-10-CM | POA: Insufficient documentation

## 2014-02-12 DIAGNOSIS — C8318 Mantle cell lymphoma, lymph nodes of multiple sites: Secondary | ICD-10-CM

## 2014-02-12 DIAGNOSIS — Z791 Long term (current) use of non-steroidal anti-inflammatories (NSAID): Secondary | ICD-10-CM | POA: Insufficient documentation

## 2014-02-12 DIAGNOSIS — G4733 Obstructive sleep apnea (adult) (pediatric): Secondary | ICD-10-CM | POA: Insufficient documentation

## 2014-02-12 DIAGNOSIS — E785 Hyperlipidemia, unspecified: Secondary | ICD-10-CM | POA: Insufficient documentation

## 2014-02-12 LAB — CBC
HCT: 43.4 % (ref 39.0–52.0)
Hemoglobin: 14.8 g/dL (ref 13.0–17.0)
MCH: 30 pg (ref 26.0–34.0)
MCHC: 34.1 g/dL (ref 30.0–36.0)
MCV: 88 fL (ref 78.0–100.0)
Platelets: 116 10*3/uL — ABNORMAL LOW (ref 150–400)
RBC: 4.93 MIL/uL (ref 4.22–5.81)
RDW: 14.3 % (ref 11.5–15.5)
WBC: 5.3 10*3/uL (ref 4.0–10.5)

## 2014-02-12 MED ORDER — LIDOCAINE-EPINEPHRINE (PF) 2 %-1:200000 IJ SOLN
INTRAMUSCULAR | Status: AC
  Filled 2014-02-12: qty 20

## 2014-02-12 MED ORDER — CEFAZOLIN SODIUM 10 G IJ SOLR
3.0000 g | INTRAMUSCULAR | Status: AC
  Administered 2014-02-12: 3 g via INTRAVENOUS
  Filled 2014-02-12: qty 3000

## 2014-02-12 MED ORDER — SODIUM CHLORIDE 0.9 % IV SOLN
INTRAVENOUS | Status: DC
  Administered 2014-02-12: 12:00:00 via INTRAVENOUS

## 2014-02-12 MED ORDER — LIDOCAINE HCL 1 % IJ SOLN
INTRAMUSCULAR | Status: AC
  Filled 2014-02-12: qty 20

## 2014-02-12 MED ORDER — FENTANYL CITRATE 0.05 MG/ML IJ SOLN
INTRAMUSCULAR | Status: DC | PRN
  Administered 2014-02-12 (×5): 25 ug via INTRAVENOUS

## 2014-02-12 MED ORDER — MIDAZOLAM HCL 2 MG/2ML IJ SOLN
INTRAMUSCULAR | Status: AC
  Filled 2014-02-12: qty 4

## 2014-02-12 MED ORDER — HEPARIN SOD (PORK) LOCK FLUSH 100 UNIT/ML IV SOLN
INTRAVENOUS | Status: AC
  Filled 2014-02-12: qty 5

## 2014-02-12 MED ORDER — MIDAZOLAM HCL 2 MG/2ML IJ SOLN
INTRAMUSCULAR | Status: DC | PRN
  Administered 2014-02-12 (×5): 1 mg via INTRAVENOUS

## 2014-02-12 MED ORDER — FENTANYL CITRATE 0.05 MG/ML IJ SOLN
INTRAMUSCULAR | Status: AC
  Filled 2014-02-12: qty 4

## 2014-02-12 NOTE — H&P (Signed)
[name] [name] is an 73 y.o. male.   Chief Complaint: "I'm getting a port a cath" HPI: [name] [name] is a 73 y.o. male who was  referred to oncology by his family physician because of discovery of abdominal lymphadenopathy while undergoing workup for back pain. He was discovered to have diffuse lymphadenopathy and underwent an inguinal lymph node biopsy on 01/30/2014 with final diagnosis being mantle cell lymphoma. He presents today for port a cath placement for chemotherapy.     Past Medical History  Diagnosis Date  . Hypertension   . Hyperlipidemia   . Rhinitis   . Peripheral edema   . Osteoarthritis   . Obesity     morbid  . CAD (coronary artery disease)     s/p angioplasty  . ED (erectile dysfunction)   . OSA (obstructive sleep apnea)     severe, on CPAP 12  . Restless leg syndrome   . Insomnia   . Anxiety   . Bilateral lower extremity edema   . Tobacco abuse     Past Surgical History  Procedure Laterality Date  . Total knee arthroplasty      left  . Hemorrhoid surgery    . Back surgery  10/2009  . Eye surgery      lazer bil  . Coronary angioplasty    . Total knee arthroplasty  08/04/2012    right knee  . Total knee arthroplasty  08/04/2012    Procedure: TOTAL KNEE ARTHROPLASTY;  Surgeon: Yvette Rack., MD;  Location: [facility];  Service: Orthopedics;  Laterality: Right;  WITH PATELLA RESURFACING  . Lymph node biopsy Right 01/2014    groin area    Family History  Problem Relation Age of Onset  . Coronary artery disease Neg Hx    Social History:  reports that he has been smoking Cigarettes.  He has a 55 pack-year smoking history. He has never used smokeless tobacco. He reports that he does not drink alcohol or use illicit drugs.  Allergies: No Known Allergies  Current outpatient prescriptions:celecoxib (CELEBREX) 200 MG capsule, Take 200 mg by mouth 2 (two) times daily as needed for moderate pain. , Disp: , Rfl: ;  clonazePAM (KLONOPIN) 1 MG tablet, Take 1.5 mg  by mouth at bedtime., Disp: , Rfl: ;  gabapentin (NEURONTIN) 300 MG capsule, Take 300-600 mg by mouth 3 (three) times daily as needed (for pain). Take 2 capsules in the morning and 1 capsule upto twice a day, Disp: , Rfl:  Multiple Vitamin (MULTIVITAMIN WITH MINERALS) TABS tablet, Take 2 tablets by mouth daily., Disp: , Rfl: ;  oxyCODONE-acetaminophen (PERCOCET/ROXICET) 5-325 MG per tablet, Take 1 tablet by mouth every 4 (four) hours as needed for severe pain., Disp: , Rfl: ;  rOPINIRole (REQUIP) 1 MG tablet, Take 2 mg by mouth at bedtime., Disp: , Rfl:  tiZANidine (ZANAFLEX) 4 MG tablet, Take 1 tablet by mouth every 8 (eight) hours as needed for muscle spasms. , Disp: , Rfl: ;  acyclovir (ZOVIRAX) 400 MG tablet, Take 1 tablet (400 mg total) by mouth daily., Disp: 30 tablet, Rfl: 3;  allopurinol (ZYLOPRIM) 300 MG tablet, Take 1 tablet (300 mg total) by mouth daily., Disp: 30 tablet, Rfl: 3 lidocaine-prilocaine (EMLA) cream, Apply a quarter size amount to port site 1 hour prior to chemo. Do not rub in. Cover with plastic wrap., Disp: 30 g, Rfl: 3;  prochlorperazine (COMPAZINE) 10 MG tablet, Take 1 tablet (10 mg total) by mouth every 6 (six)  hours as needed for nausea or vomiting., Disp: 60 tablet, Rfl: 2 Current facility-administered medications:0.9 %  sodium chloride infusion, , Intravenous, Continuous, D Kevin Allred, PA-C;  ceFAZolin (ANCEF) 3 g in dextrose 5 % 50 mL IVPB, 3 g, Intravenous, On Call, D Rowe Robert, PA-C  01/30/14  PT 13.6  INR 1.06         02/12/14 CBC pending Review of Systems  Constitutional: Negative for fever and chills.  Respiratory: Positive for shortness of breath and wheezing. Negative for cough and hemoptysis.        Uses CPAP  Cardiovascular: Positive for leg swelling. Negative for chest pain.  Gastrointestinal: Negative for nausea, vomiting, abdominal pain and blood in stool.  Genitourinary: Negative for dysuria and hematuria.  Musculoskeletal: Positive for back pain.   Neurological: Negative for headaches.  Endo/Heme/Allergies: Does not bruise/bleed easily.    Blood pressure 139/85, pulse 67, temperature 97.9 F (36.6 C), temperature source Oral, resp. rate 20, height 5' 8.5" (1.74 m), weight 293 lb (132.904 kg), SpO2 97.00%. Physical Exam  Constitutional: He is oriented to person, place, and time. He appears well-developed and well-nourished.  Cardiovascular: Normal rate and regular rhythm.   Respiratory:  Distant BS bilat with few exp wheezes  GI: Soft. Bowel sounds are normal. There is no tenderness.  obese  Musculoskeletal: Normal range of motion. He exhibits edema.  Neurological: He is alert and oriented to person, place, and time.  Skin: Skin is warm and dry.  Psychiatric: He has a normal mood and affect.     Assessment/Plan Pt with recently diagnosed mantle cell lymphoma. Plan is for port a cath placement today for chemotherapy. Details/risks of procedure d/w pt/daughter with their understanding and consent.  ALLRED,D KEVIN 02/12/2014, 12:14 PM

## 2014-02-12 NOTE — Discharge Instructions (Signed)
Implanted Port Home Guide °An implanted port is a type of central line that is placed under the skin. Central lines are used to provide IV access when treatment or nutrition needs to be given through a person's veins. Implanted ports are used for long-term IV access. An implanted port may be placed because:  °· You need IV medicine that would be irritating to the small veins in your hands or arms.   °· You need long-term IV medicines, such as antibiotics.   °· You need IV nutrition for a long period.   °· You need frequent blood draws for lab tests.   °· You need dialysis.   °Implanted ports are usually placed in the chest area, but they can also be placed in the upper arm, the abdomen, or the leg. An implanted port has two main parts:  °· Reservoir. The reservoir is round and will appear as a small, raised area under your skin. The reservoir is the part where a needle is inserted to give medicines or draw blood.   °· Catheter. The catheter is a thin, flexible tube that extends from the reservoir. The catheter is placed into a large vein. Medicine that is inserted into the reservoir goes into the catheter and then into the vein.   °HOW WILL I CARE FOR MY INCISION SITE? °Do not get the incision site wet. Bathe or shower as directed by your health care provider.  °HOW IS MY PORT ACCESSED? °Special steps must be taken to access the port:  °· Before the port is accessed, a numbing cream can be placed on the skin. This helps numb the skin over the port site.   °· Your health care provider uses a sterile technique to access the port. °· Your health care provider must put on a mask and sterile gloves. °· The skin over your port is cleaned carefully with an antiseptic and allowed to dry. °· The port is gently pinched between sterile gloves, and a needle is inserted into the port. °· Only "non-coring" port needles should be used to access the port. Once the port is accessed, a blood return should be checked. This helps  ensure that the port is in the vein and is not clogged.   °· If your port needs to remain accessed for a constant infusion, a clear (transparent) bandage will be placed over the needle site. The bandage and needle will need to be changed every week, or as directed by your health care provider.   °· Keep the bandage covering the needle clean and dry. Do not get it wet. Follow your health care provider's instructions on how to take a shower or bath while the port is accessed.   °· If your port does not need to stay accessed, no bandage is needed over the port.   °WHAT IS FLUSHING? °Flushing helps keep the port from getting clogged. Follow your health care provider's instructions on how and when to flush the port. Ports are usually flushed with saline solution or a medicine called heparin. The need for flushing will depend on how the port is used.  °· If the port is used for intermittent medicines or blood draws, the port will need to be flushed:   °· After medicines have been given.   °· After blood has been drawn.   °· As part of routine maintenance.   °· If a constant infusion is running, the port may not need to be flushed.   °HOW LONG WILL MY PORT STAY IMPLANTED? °The port can stay in for as long as your health care   provider thinks it is needed. When it is time for the port to come out, surgery will be done to remove it. The procedure is similar to the one performed when the port was put in.  WHEN SHOULD I SEEK IMMEDIATE MEDICAL CARE? When you have an implanted port, you should seek immediate medical care if:   You notice a bad smell coming from the incision site.   You have swelling, redness, or drainage at the incision site.   You have more swelling or pain at the port site or the surrounding area.   You have a fever that is not controlled with medicine. Document Released: 08/16/2005 Document Revised: 06/06/2013 Document Reviewed: 04/23/2013 [facility] Patient Information 2014 [place]. Moderate Sedation, Adult Moderate sedation is given to help you relax or even sleep through a procedure. You may remain sleepy, be clumsy, or have poor balance for several hours following this procedure. Arrange for a responsible adult, family member, or friend to take you home. A responsible adult should stay with you for at least 24 hours or until the medicines have worn off.  Do not participate in any activities where you could become injured for the next 24 hours, or until you feel normal again. Do not:  Drive.  Swim.  Ride a bicycle.  Operate heavy machinery.  Cook.  Use power tools.  Climb ladders.  Work at [employer].  Do not make important decisions or sign legal documents until you are improved.  Vomiting may occur if you eat too soon. When you can drink without vomiting, try water, juice, or soup. Try solid foods if you feel little or no nausea.  Only take over-the-counter or prescription medications for pain, discomfort, or fever as directed by your caregiver.If pain medications have been prescribed for you, ask your caregiver how soon it is safe to take them.  Make sure you and your family fully understands everything about the medication given to you. Make sure you understand what side effects may occur.  You should not drink alcohol, take sleeping pills, or medications that cause drowsiness for at least 24 hours.  If you smoke, do not smoke alone.  If you are feeling better, you may resume normal activities 24 hours after receiving sedation.  Keep all appointments as scheduled. Follow all instructions.  Ask questions if you do not understand. SEEK MEDICAL CARE IF:   Your skin is pale or bluish in color.  You continue to feel sick to your stomach (nauseous) or throw up (vomit).  Your pain is getting worse and not helped by medication.  You have bleeding or swelling.  You are still sleepy or feeling clumsy after 24 hours. SEEK IMMEDIATE MEDICAL CARE IF:    You develop a rash.  You have difficulty breathing.  You develop any type of allergic problem.  You have a fever. Document Released: 05/11/2001 Document Revised: 11/08/2011 Document Reviewed: 04/23/2013 [facility] Patient Information 2014 [place]. Implanted Port Insertion, Care After Refer to this sheet in the next few weeks. These instructions provide you with information on caring for yourself after your procedure. Your health care provider may also give you more specific instructions. Your treatment has been planned according to current medical practices, but problems sometimes occur. Call your health care provider if you have any problems or questions after your procedure. WHAT TO EXPECT AFTER THE PROCEDURE After your procedure, it is typical to have the following:   Discomfort at the port insertion site. Ice packs to the  area will help.  Bruising on the skin over the port. This will subside in 3 4 days. HOME CARE INSTRUCTIONS  After your port is placed, you will get a manufacturer's information card. The card has information about your port. Keep this card with you at all times.   Know what kind of port you have. There are many types of ports available.   Wear a medical alert bracelet in case of an emergency. This can help alert health care workers that you have a port.   The port can stay in for as long as your health care provider believes it is necessary.   A home health care nurse may give medicines and take care of the port.   You or a family member can get special training and directions for giving medicine and taking care of the port at home.  SEEK MEDICAL CARE IF:  Your port does not flush or you are unable to get a blood return.   SEEK IMMEDIATE MEDICAL CARE IF:  You have new fluid or pus coming from your incision.   You notice a bad smell coming from your incision site.   You have swelling, pain, or more redness at the incision or port site.    You have a fever or chills.   You have chest pain or shortness of breath. Document Released: 06/06/2013 Document Reviewed: 04/23/2013 [facility] Patient Information 2014 [place], [place].

## 2014-02-12 NOTE — Procedures (Signed)
Successful placement of right IJ approach port-a-cath with tip at the superior caval atrial junction. The catheter is ready for immediate use. No immediate post procedural complications.

## 2014-02-14 ENCOUNTER — Encounter (HOSPITAL_BASED_OUTPATIENT_CLINIC_OR_DEPARTMENT_OTHER): Payer: Medicare Other

## 2014-02-14 DIAGNOSIS — C8318 Mantle cell lymphoma, lymph nodes of multiple sites: Secondary | ICD-10-CM

## 2014-02-14 DIAGNOSIS — C8315 Mantle cell lymphoma, lymph nodes of inguinal region and lower limb: Secondary | ICD-10-CM

## 2014-02-14 LAB — HEPATITIS B SURFACE ANTIGEN: HEP B S AG: NEGATIVE

## 2014-02-14 LAB — HEPATITIS B CORE ANTIBODY, IGM: HEP B C IGM: NONREACTIVE

## 2014-02-14 NOTE — Progress Notes (Signed)
LABS DRAWN FOR HEP. B CORE AB IGM, HEP. B SURF ANTIGEN.

## 2014-02-14 NOTE — Progress Notes (Signed)
Chemo teaching done and consent signed for rituxan/bendamustine. Meds called in. Pt has already picked up meds. Chemo calendar given.

## 2014-02-26 ENCOUNTER — Encounter (HOSPITAL_BASED_OUTPATIENT_CLINIC_OR_DEPARTMENT_OTHER): Payer: Medicare Other

## 2014-02-26 ENCOUNTER — Encounter (HOSPITAL_COMMUNITY): Payer: Self-pay

## 2014-02-26 VITALS — BP 140/80 | HR 88 | Temp 98.4°F | Resp 18

## 2014-02-26 VITALS — BP 119/61 | HR 62 | Temp 97.4°F | Resp 18 | Wt 300.0 lb

## 2014-02-26 DIAGNOSIS — D696 Thrombocytopenia, unspecified: Secondary | ICD-10-CM

## 2014-02-26 DIAGNOSIS — G4700 Insomnia, unspecified: Secondary | ICD-10-CM

## 2014-02-26 DIAGNOSIS — Z5112 Encounter for antineoplastic immunotherapy: Secondary | ICD-10-CM

## 2014-02-26 DIAGNOSIS — C8318 Mantle cell lymphoma, lymph nodes of multiple sites: Secondary | ICD-10-CM

## 2014-02-26 LAB — COMPREHENSIVE METABOLIC PANEL
ALBUMIN: 3.6 g/dL (ref 3.5–5.2)
ALK PHOS: 86 U/L (ref 39–117)
ALT: 8 U/L (ref 0–53)
AST: 10 U/L (ref 0–37)
BILIRUBIN TOTAL: 0.4 mg/dL (ref 0.3–1.2)
BUN: 19 mg/dL (ref 6–23)
CHLORIDE: 103 meq/L (ref 96–112)
CO2: 26 mEq/L (ref 19–32)
Calcium: 8.7 mg/dL (ref 8.4–10.5)
Creatinine, Ser: 0.81 mg/dL (ref 0.50–1.35)
GFR calc Af Amer: >90 mL/min (ref >90)
GFR calc non Af Amer: 87 mL/min — ABNORMAL LOW (ref >90)
Glucose, Bld: 120 mg/dL — ABNORMAL HIGH (ref 70–99)
POTASSIUM: 4.3 meq/L (ref 3.7–5.3)
SODIUM: 140 meq/L (ref 137–147)
Total Protein: 6.8 g/dL (ref 6.0–8.3)

## 2014-02-26 LAB — CBC WITH DIFFERENTIAL/PLATELET
BASOS ABS: 0 10*3/uL (ref 0.0–0.1)
BASOS PCT: 1 % (ref 0–1)
Eosinophils Absolute: 0.1 10*3/uL (ref 0.0–0.7)
Eosinophils Relative: 1 % (ref 0–5)
HCT: 40.7 % (ref 39.0–52.0)
HEMOGLOBIN: 13.9 g/dL (ref 13.0–17.0)
Lymphocytes Relative: 20 % (ref 12–46)
Lymphs Abs: 0.9 10*3/uL (ref 0.7–4.0)
MCH: 30.4 pg (ref 26.0–34.0)
MCHC: 34.2 g/dL (ref 30.0–36.0)
MCV: 89.1 fL (ref 78.0–100.0)
Monocytes Absolute: 0.3 10*3/uL (ref 0.1–1.0)
Monocytes Relative: 7 % (ref 3–12)
NEUTROS ABS: 3.1 10*3/uL (ref 1.7–7.7)
NEUTROS PCT: 71 % (ref 43–77)
PLATELETS: 124 10*3/uL — AB (ref 150–400)
RBC: 4.57 MIL/uL (ref 4.22–5.81)
RDW: 14.7 % (ref 11.5–15.5)
WBC: 4.3 10*3/uL (ref 4.0–10.5)

## 2014-02-26 LAB — URIC ACID: Uric Acid, Serum: 3.6 mg/dL — ABNORMAL LOW (ref 4.0–7.8)

## 2014-02-26 MED ORDER — DEXAMETHASONE SODIUM PHOSPHATE 10 MG/ML IJ SOLN: 10.0000 mg | Freq: Once | INTRAMUSCULAR | Status: DC

## 2014-02-26 MED ORDER — ACETAMINOPHEN 325 MG PO TABS: 650.0000 mg | ORAL_TABLET | Freq: Once | ORAL | Status: DC

## 2014-02-26 MED ORDER — SODIUM CHLORIDE 0.9 % IV SOLN
6.0000 mg | Freq: Once | INTRAVENOUS | Status: AC
  Administered 2014-02-26: 6 mg via INTRAVENOUS
  Filled 2014-02-26: qty 4

## 2014-02-26 MED ORDER — SODIUM CHLORIDE 0.9 % IJ SOLN
10.0000 mL | INTRAMUSCULAR | Status: DC | PRN
  Administered 2014-02-26: 10 mL

## 2014-02-26 MED ORDER — DIPHENHYDRAMINE HCL 25 MG PO CAPS: 50.0000 mg | ORAL_CAPSULE | Freq: Once | ORAL | Status: DC

## 2014-02-26 MED ORDER — RITUXIMAB CHEMO INJECTION 10 MG/ML
375.0000 mg/m2 | Freq: Once | INTRAVENOUS | Status: AC
  Administered 2014-02-26: 1000 mg via INTRAVENOUS
  Filled 2014-02-26: qty 100

## 2014-02-26 MED ORDER — SODIUM CHLORIDE 0.9 % IV SOLN: 16.0000 mg | Freq: Once | INTRAVENOUS | Status: DC

## 2014-02-26 MED ORDER — HEPARIN SOD (PORK) LOCK FLUSH 100 UNIT/ML IV SOLN
500.0000 [IU] | Freq: Once | INTRAVENOUS | Status: AC | PRN
  Administered 2014-02-26: 500 [IU]
  Filled 2014-02-26 (×2): qty 5

## 2014-02-26 MED ORDER — ONDANSETRON HCL 40 MG/20ML IJ SOLN
Freq: Once | INTRAMUSCULAR | Status: AC
  Administered 2014-02-26: 16 mg via INTRAVENOUS
  Filled 2014-02-26: qty 8

## 2014-02-26 MED ORDER — SODIUM CHLORIDE 0.9 % IV SOLN
Freq: Once | INTRAVENOUS | Status: AC
  Administered 2014-02-26: 10:00:00 via INTRAVENOUS

## 2014-02-26 MED ORDER — BENDAMUSTINE HCL CHEMO INJECTION 180 MG/2ML
70.0000 mg/m2 | Freq: Once | INTRAVENOUS | Status: AC
  Administered 2014-02-26: 180 mg via INTRAVENOUS
  Filled 2014-02-26: qty 2

## 2014-02-26 NOTE — Progress Notes (Signed)
[place]  OFFICE PROGRESS NOTE  Alonza Bogus, MD 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   INTERVAL HISTORY: [name] [name] 72 y.o. male returns for followup and for initiation of chemotherapy for mantle cell lymphoma diagnosed by inguinal lymph node biopsy on 01/30/2014, status post LifePort insertion on 02/12/2014 to begin therapy with bendamustine/Rituxan with Neulasta support.  He has had his sleeping problem falling asleep and staying asleep for 30 years. He does have a CPAP machine at home. Lately has been trying temazepam 15 mg without much success. I believe he has some increased anxiety as a result of his recent diagnosis. Appetite is good with no nausea, vomiting, fever, night sweats, lower extremity swelling or redness, PND, orthopnea, palpitations, headache, or seizures.  MEDICAL HISTORY: Past Medical History  Diagnosis Date   Hypertension    Hyperlipidemia    Rhinitis    Peripheral edema    Osteoarthritis    Obesity     morbid   CAD (coronary artery disease)     s/p angioplasty   ED (erectile dysfunction)    OSA (obstructive sleep apnea)     severe, on CPAP 12   Restless leg syndrome    Insomnia    Anxiety    Bilateral lower extremity edema    Tobacco abuse     INTERIM HISTORY: has TOBACCO ABUSE; OSA (obstructive sleep apnea); RESTLESS LEG SYNDROME; CAD, NATIVE VESSEL; INSOMNIA; Osteoarthritis; CAD (coronary artery disease); Anxiety; Lower extremity edema; Varicose veins of lower extremities with other complications; Chronic bronchitis; and 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     ALLERGIES:  has No Known Allergies.  MEDICATIONS: has a current medication list which includes the following prescription(s): acetaminophen, acyclovir, allopurinol, celecoxib, clonazepam, diphenhydramine, lidocaine-prilocaine, multivitamin with minerals, oxycodone-acetaminophen, prochlorperazine, ropinirole, tizanidine, and gabapentin.  SURGICAL HISTORY:  Past Surgical History  Procedure Laterality Date   Total knee arthroplasty      left   Hemorrhoid surgery     Back surgery  10/2009   Eye surgery      lazer bil   Coronary angioplasty     Total knee arthroplasty  08/04/2012    right knee   Total knee arthroplasty  08/04/2012    Procedure: TOTAL KNEE ARTHROPLASTY;  Surgeon: Yvette Rack., MD;  Location: [facility];  Service: Orthopedics;  Laterality: Right;  WITH PATELLA RESURFACING   Lymph node biopsy Right 01/2014    groin area    FAMILY HISTORY: family history is negative for Coronary artery disease.  SOCIAL HISTORY:  reports that he has been smoking Cigarettes.  He has a 55 pack-year smoking history. He has never used smokeless tobacco. He reports that he does not drink alcohol or use illicit drugs.  REVIEW OF SYSTEMS:  Other than that discussed above is noncontributory.  PHYSICAL EXAMINATION: ECOG PERFORMANCE STATUS: 1 - Symptomatic but completely ambulatory  Blood pressure 119/61, pulse 62, temperature 97.4 F (36.3 C), temperature source Oral, resp. rate 18, weight 300 lb (136.079 kg).  GENERAL:alert, no distress and comfortable. Moderately obese. SKIN: skin color, texture, turgor are normal, no rashes or significant  lesions EYES: PERLA; Conjunctiva are pink and non-injected, sclera clear SINUSES: No redness or tenderness over maxillary or ethmoid sinuses OROPHARYNX:no exudate, no erythema on lips, buccal mucosa, or tongue. NECK: supple, thyroid normal size, non-tender, without nodularity. No masses CHEST: Increased AP diameter with no gynecomastia. LifePort in  place. LYMPH:  no palpable lymphadenopathy in the cervical, axillary. Inguinal lymph node biopsy site well-healed.. LUNGS: clear to auscultation and percussion with normal breathing effort HEART: regular rate & rhythm and no murmurs. ABDOMEN:abdomen soft, non-tender and normal bowel sounds MUSCULOSKELETAL:no cyanosis of digits and no clubbing. Range of motion normal.  NEURO: alert & oriented x 3 with fluent speech, no focal motor/sensory deficits   LABORATORY DATA: Lab on 02/14/2014  Component Date Value Ref Range Status   Hep B C IgM 02/14/2014 NON REACTIVE  NON REACTIVE Final   Comment: (NOTE)                          High levels of Hepatitis B Core IgM antibody are detectable                          during the acute stage of Hepatitis B. This antibody is used                          to differentiate current from past HBV infection.                          Performed at [facility]   Hepatitis B Surface Ag 02/14/2014 NEGATIVE  NEGATIVE Final   Performed at [facility] Outpatient Visit on 02/12/2014  Component Date Value Ref Range Status   WBC 02/12/2014 5.3  4.0 - 10.5 K/uL Final   RBC 02/12/2014 4.93  4.22 - 5.81 MIL/uL Final   Hemoglobin 02/12/2014 14.8  13.0 - 17.0 g/dL Final   HCT 02/12/2014 43.4  39.0 - 52.0 % Final   MCV 02/12/2014 88.0  78.0 - 100.0 fL Final   MCH 02/12/2014 30.0  26.0 - 34.0 pg Final   MCHC 02/12/2014 34.1  30.0 - 36.0 g/dL Final   RDW 02/12/2014 14.3  11.5 - 15.5 % Final   Platelets 02/12/2014 116* 150 - 400 K/uL Final   Comment: SPECIMEN CHECKED FOR CLOTS                          REPEATED TO VERIFY                          PLATELET COUNT CONFIRMED BY SMEAR  Office Visit on 02/06/2014  Component Date Value Ref Range Status   WBC 02/06/2014 5.0  4.0 - 10.5 K/uL Final   RBC 02/06/2014 5.00  4.22 - 5.81 MIL/uL Final   Hemoglobin 02/06/2014 15.1  13.0 - 17.0 g/dL Final   HCT 02/06/2014 44.3  39.0 - 52.0 %  Final   MCV 02/06/2014 88.6  78.0 - 100.0 fL Final   MCH 02/06/2014 30.2  26.0 - 34.0 pg Final   MCHC 02/06/2014 34.1  30.0 - 36.0 g/dL Final   RDW 02/06/2014 14.5  11.5 - 15.5 % Final   Platelets 02/06/2014 125* 150 - 400 K/uL Final   Neutrophils Relative % 02/06/2014 75  43 - 77 % Final  Neutro Abs 02/06/2014 3.8  1.7 - 7.7 K/uL Final   Lymphocytes Relative 02/06/2014 15  12 - 46 % Final   Lymphs Abs 02/06/2014 0.8  0.7 - 4.0 K/uL Final   Monocytes Relative 02/06/2014 9  3 - 12 % Final   Monocytes Absolute 02/06/2014 0.4  0.1 - 1.0 K/uL Final   Eosinophils Relative 02/06/2014 1  0 - 5 % Final   Eosinophils Absolute 02/06/2014 0.1  0.0 - 0.7 K/uL Final   Basophils Relative 02/06/2014 0  0 - 1 % Final   Basophils Absolute 02/06/2014 0.0  0.0 - 0.1 K/uL Final   Retic Ct Pct 02/06/2014 1.3  0.4 - 3.1 % Final   RBC. 02/06/2014 5.00  4.22 - 5.81 MIL/uL Final   Retic Count, Manual 02/06/2014 65.0  19.0 - 186.0 K/uL Final   Sodium 02/06/2014 136* 137 - 147 mEq/L Final   Potassium 02/06/2014 4.2  3.7 - 5.3 mEq/L Final   Chloride 02/06/2014 99  96 - 112 mEq/L Final   CO2 02/06/2014 24  19 - 32 mEq/L Final   Glucose, Bld 02/06/2014 117* 70 - 99 mg/dL Final   BUN 02/06/2014 19  6 - 23 mg/dL Final   Creatinine, Ser 02/06/2014 0.91  0.50 - 1.35 mg/dL Final   Calcium 02/06/2014 9.2  8.4 - 10.5 mg/dL Final   Total Protein 02/06/2014 7.4  6.0 - 8.3 g/dL Final   Albumin 02/06/2014 4.0  3.5 - 5.2 g/dL Final   AST 02/06/2014 13  0 - 37 U/L Final   ALT 02/06/2014 9  0 - 53 U/L Final   Alkaline Phosphatase 02/06/2014 87  39 - 117 U/L Final   Total Bilirubin 02/06/2014 0.5  0.3 - 1.2 mg/dL Final   GFR calc non Af Amer 02/06/2014 83* >90 mL/min Final   GFR calc Af Amer 02/06/2014 >90  >90 mL/min Final   Comment: (NOTE)                          The eGFR has been calculated using the CKD EPI equation.                          This calculation has not been validated in  all clinical situations.                          eGFR's persistently <90 mL/min signify possible Chronic Kidney                          Disease.   LDH 02/06/2014 168  94 - 250 U/L Final   HEMOLYZED SPECIMEN, RESULTS MAY BE AFFECTED   Beta-2 Microglobulin 02/06/2014 3.64* <=2.51 mg/L Final   Performed at [facility] Outpatient Visit on 01/30/2014  Component Date Value Ref Range Status   aPTT 01/30/2014 35  24 - 37 seconds Final   WBC 01/30/2014 4.4  4.0 - 10.5 K/uL Final   RBC 01/30/2014 4.79  4.22 - 5.81 MIL/uL Final   Hemoglobin 01/30/2014 15.0  13.0 - 17.0 g/dL Final   HCT 01/30/2014 43.4  39.0 - 52.0 % Final   MCV 01/30/2014 90.6  78.0 - 100.0 fL Final   MCH 01/30/2014 31.3  26.0 - 34.0 pg Final   MCHC 01/30/2014 34.6  30.0 - 36.0 g/dL Final   RDW 01/30/2014 14.5  11.5 - 15.5 % Final   Platelets 01/30/2014 113* 150 - 400 K/uL Final   Comment: SPECIMEN CHECKED FOR CLOTS                          REPEATED TO VERIFY                          PLATELET COUNT CONFIRMED BY SMEAR   Prothrombin Time 01/30/2014 13.6  11.6 - 15.2 seconds Final   INR 01/30/2014 1.06  0.00 - 1.49 Final    PATHOLOGY:  FINAL for KAYSTON, JODOIN ([id number]) Patient: Wenda Overland Collected: 01/30/2014 Client: [facility] Accession: [id number] Received: 01/30/2014 Art Hoss DOB: 1941/03/25 Age: 11 Gender: M Reported: 02/01/2014 [address] Patient Ph: [phone] MRN #: [id number] [place], [place] [address] Visit #: [id number] Chart #: Phone:  Fax: CC: Fredirick Maudlin, MD REPORT OF SURGICAL PATHOLOGY FINAL DIAGNOSIS Diagnosis Lymph node, biopsy, Right Groin- LN - MANTLE CELL LYMPHOMA. - SEE ONCOLOGY TABLE. Microscopic Comment LYMPHOMA Histologic type: Non-Hodgkin lymphoma, mantle cell type. Grade (if applicable): Intermediate grade. Flow cytometry: Monoclonal, kappa restricted B-cell population expressing pan B-cell antigens including  CD20 in association with CD5 expression. No CD10 or CD23 expression identified. Immunohistochemical stains: CD20, CD79a, CD3, CD43, CD5, CD10, BCL-6 and cyclin D1 with appropriate controls. Touch preps/imprints: Not done. Comments: The sections show small needle biopsy fragments displaying a relatively monomorphic population of small round to irregular lymphoid cells with high nuclear cytoplasmic ratio, dense chromatin and small to inconspicuous nucleoli associated with scattered mitosis and lack of necrosis. The appearance is apparently diffuse with lack of obvious or definite follicular structures or proliferation centers. To further evaluate this process, flow cytometric analysis was performed and shows a monoclonal, kappa restricted B-cell population expressing pan B-cell antigens including CD20 in association with CD5 expression. In addition, immunohistochemical stains were performed and show that the lymphoid cells are predominantly composed of B lymphocytes as seen with CD20 and CD79a associated with CD5, CD43 and cyclin D1 expression in B-cell areas. No appreciable CD10 or BCL-6 positivity is identified. There is an admixed minor component of T cells as primarily seen with CD3. The histologic and immunophenotypic features are consistent with mantle cell lymphoma. (BNS:ecj 02/01/2014) Guerry Bruin MD Pathologist, Electronic Signature (Case signed 02/01/2014) Specimen Gross and Clinical Information Specimen(s) Obtained: Lymph node, biopsy, Right Groin- LN 1 of 2 FINAL for ROHIT, DELORIA [phone]) Specimen Clinical Information (tl) Gross Received fresh are cores of soft white tissue having an aggregate measurement of 0.8 x 0.5 x 0.1 cm. A portion of the specimen is placed in RPMI and the remainder is entirely submitted in one cassette. (GRP:kh 01-30-14) Stain(s) used in Diagnosis: The following stain(s) were used in diagnosing the case: CD-10, CD 43, BCl 6 , CD 3, CYCLIN D1, CD  20, CD 5, CD 79a. The control(s) stained appropriately. Disclaimer Some of these immunohistochemical stains may have been developed and the performance characteristics determined by [facility]. Some may not have been cleared or approved by the U.S. Food and Drug Administration. The FDA has determined that such clearance or approval is not necessary. This test is used for clinical purposes. It should not be regarded as investigational or for research. This laboratory is certified under the Clinical Laboratory Improvement Amendments of 1988 (CLIA-88) as qualified to perform high complexity clinical laboratory testing. Report signed out from the following location(s)  Technical Component performed at [facility]. [address],[place] [address].ZWCH:[id number]., Technical Component performed at [address], [place], [place] [address]. CLIA #: [id number], Interpretation performed at [place], [place], [place] [address]. CLIA #: [id number],    FINAL for TODDY, BOYD ([id number]) Patient: [name] Collected: 01/30/2014 Client: [facility] Accession: [id number] Received: 01/30/2014 Art Hoss DOB: 25-Nov-1940 Age: 77 Gender: M Reported: 02/01/2014 [address] Patient Ph: [phone] MRN #: [id number] [place], [place] [address] Visit #: [id number] Chart #: Phone:  Fax: CC: Sinda Du FLOW CYTOMETRY REPORT INTERPRETATION Interpretation Tissue-Flow Cytometry - MONOCLONAL B-CELL POPULATION IDENTIFIED. 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 The presence of CD5 expression correlates with the diagnosis of mantle cell lymphoma seen in the lymph node biopsy ([id number]). (BNS:ecj 02/01/2014) Susanne Greenhouse MD Pathologist, Electronic Signature (Case signed 02/01/2014) GROSS AND MICROSCOPIC  INFORMATION Source Tissue-Flow Cytometry Microscopic Gated population: Flow cytometric immunophenotyping is performed using antibiodies to the antigens listed in the table below. Electronic gates are placed around a cell cluster displaying light scatter properties corresponding to lymphocytes. - Abnormal Cells in gated population: 81 % - Phenotype of Abnormal Cells: CD5, CD19, CD20, CD21, CD22, HLA-Dr, Kappa 1 of 2 FINAL for [place], Rabon V ([id number]) Specimen Table Lymphoid Associated Myeloid Associated Misc. As CD2 neg CD19 pos CD11c ND CD45 ND CD3 neg CD20 pos CD13 ND HLA-DR pos CD4 neg CD21 pos CD14 ND CD10 neg CD5 pos CD22 pos CD15 ND CD56/16 ND CD7 neg CD23 neg CD33 ND ZAP70 ND CD8 neg CD103 ND MPO ND CD34 ND CD25 ND FMC7 ND CD117 ND CD52 ND sKappa pos CD38 ND sLambda neg 7AAD tested cKappa ND cLambda ND Gross ref. [phone] All controls stained appropriately. The above tests were developed and their performance characteristics determined by the [facility]. They have not been cleared or approved by the U.S. Food and Drug Administration." Report signed from the following location(s) Interpretation performed at [place], [place], [place] [address]. CLIA #: [id number],  for ZYMARION, FAVORITE ([id number]) Patient: [name] Collected: 02/06/2014 Client: [facility] Accession: [id number] Received: 02/07/2014 Farrel Gobble, MD DOB: 26-Oct-1940 Age: 19 Gender: M Reported: 02/08/2014 [address] Patient Ph: [phone] MRN #: [id number] [place] [address] Visit #: [id number] Chart #: Phone: Fax: CC: FLOW CYTOMETRY REPORT INTERPRETATION Interpretation Peripheral Blood Flow Cytometry - A SMALL MONOCLONAL B-CELL POPULATION IDENTIFIED. - SEE COMMENT. Diagnosis Comment: The analysis shows a small population of monoclonal B-cells estimated at 15% of all lymphocytes and expressing pan B-cell  antigens including CD20 with associated CD5 expression. The phenotypic features are similar to previous analysis ([id number]) and consistent with minimal involvement by the previously diagnosed mantle cell lymphoma. (BNS:ecj 02/08/2014) Susanne Greenhouse MD Pathologist, Electronic Signature (Case signed 02/08/2014) GROSS AND MICROSCOPIC INFORMATION Source Peripheral Blood Flow Cytometry Microscopic Gated population: Flow cytometric immunophenotyping is performed using antibiodies to the antigens listed in the table below. Electronic gates are placed around a cell cluster displaying light scatter properties corresponding to lymphocytes. - Abnormal Cells in gated population: 15 % - Phenotype of Abnormal Cells: CD5, CD19, CD20, CD21, CD22, HLA-Dr, Kappa 1 of 2 FINAL for Semidey, Sanel V ([id number]) Specimen Table Lymphoid Associated Myeloid Associated Misc. As CD2 neg CD19 pos CD11c ND CD45 ND CD3 neg CD20 pos CD13 ND HLA-DR pos CD4 neg CD21 pos CD14 ND CD10 neg CD5 pos  CD22 pos CD15 ND CD56/16 ND CD7 neg CD23 neg CD33 ND ZAP70 ND CD8 neg CD103 ND MPO ND CD34 ND CD25 ND FMC7 ND CD117 ND CD52 ND sKappa pos CD38 ND sLambda neg cKappa ND cLambda ND Gross Received from [facility] are two lavender tubes labeled as PBS to evaluate for mantle cell lymphoma All controls stained appropriately. The above tests were developed and their performance characteristics determined by the [facility]. They have not been cleared or approved by the U.S. Food and Drug Administration." Report signed from the following location(s) Interpretation performed at [facility].[place], [place], [place] [address]. CLIA #: [id number],  Urinalysis    Component Value Date/Time   COLORURINE AMBER* 07/28/2012 1230   APPEARANCEUR CLEAR 07/28/2012 1230   LABSPEC 1.032* 07/28/2012 1230   PHURINE 5.5 07/28/2012 1230   GLUCOSEU NEGATIVE 07/28/2012 1230   HGBUR NEGATIVE 07/28/2012 1230    BILIRUBINUR SMALL* 07/28/2012 1230   KETONESUR 15* 07/28/2012 1230   PROTEINUR NEGATIVE 07/28/2012 1230   UROBILINOGEN 1.0 07/28/2012 1230   NITRITE NEGATIVE 07/28/2012 1230   LEUKOCYTESUR TRACE* 07/28/2012 1230    RADIOGRAPHIC STUDIES: US Biopsy  01/30/2014   CLINICAL DATA:  Splenomegaly and adenopathy.  Rule out lymphoma.  EXAM: ULTRASOUND-GUIDED BIOPSY OF A RIGHT INGUINAL LYMPH NODE.  CORE.  MEDICATIONS AND MEDICAL HISTORY: Versed 2 mg, Fentanyl 50 mcg.  Additional Medications: None.  ANESTHESIA/SEDATION: Moderate sedation time: 8 minutes  PROCEDURE: The procedure, risks, benefits, and alternatives were explained to the patient. Questions regarding the procedure were encouraged and answered. The patient understands and consents to the procedure.  The right groin was prepped with patent in a sterile fashion, and a sterile drape was applied covering the operative field. A sterile gown and sterile gloves were used for the procedure.  Under sonographic guidance, 4 18 gauge core biopsies of the enlarged right inguinal lymph node were obtained. Final imaging was performed.  Patient tolerated the procedure well without complication. Vital sign monitoring by nursing staff during the procedure will continue as patient is in the special procedures unit for post procedure observation.  FINDINGS: The images document guide needle placement within the enlarged right inguinal lymph node. Post biopsy images demonstrate no hemorrhage.  IMPRESSION: Successful ultrasound-guided core biopsy of an enlarged right inguinal lymph node.   Electronically Signed   By: Maryclare Bean M.D.   On: 01/30/2014 13:01   Ir Fluoro Guide Cv Line Right  02/12/2014   INDICATION: History of mantle cell lymphoma, in need of intravenous access for chemotherapy administration.  EXAM: IMPLANTED PORT A CATH PLACEMENT WITH ULTRASOUND AND FLUOROSCOPIC GUIDANCE  COMPARISON:  None.  MEDICATIONS: Ancef 3 gm IV; IV antibiotic was given in an appropriate  time interval prior to skin puncture.  ANESTHESIA/SEDATION: Versed 5 mg IV; Fentanyl 100 mcg IV;  Total Moderate Sedation Time  37  minutes.  CONTRAST:  None  FLUOROSCOPY TIME:  18 seconds  COMPLICATIONS: None immediate  PROCEDURE: The procedure, risks, benefits, and alternatives were explained to the patient. Questions regarding the procedure were encouraged and answered. The patient understands and consents to the procedure.  The right neck and chest were prepped with chlorhexidine in a sterile fashion, and a sterile drape was applied covering the operative field. Maximum barrier sterile technique with sterile gowns and gloves were used for the procedure. A timeout was performed prior to the initiation of the procedure. Local anesthesia was provided with 1% lidocaine with epinephrine.  After creating a  small venotomy incision, a micropuncture kit was utilized to access the internal jugular vein under direct, real-time ultrasound guidance. Ultrasound image documentation was performed. The microwire was kinked to measure appropriate catheter length.  A subcutaneous port pocket was then created along the upper chest wall utilizing a combination of sharp and blunt dissection. The pocket was irrigated with sterile saline. A single lumen ISP power injectable port was chosen for placement. The 8 Fr catheter was tunneled from the port pocket site to the venotomy incision. The port was placed in the pocket. The external catheter was trimmed to appropriate length. At the venotomy, an 8 Fr peel-away sheath was placed over a guidewire under fluoroscopic guidance. The catheter was then placed through the sheath and the sheath was removed. Final catheter positioning was confirmed and documented with a fluoroscopic spot radiograph. The port was accessed with a Huber needle, aspirated and flushed with heparinized saline.  The venotomy site was closed with an interrupted 4-0 Vicryl suture. The port pocket incision was closed with  interrupted 2-0 Vicryl suture and the skin was opposed with a running subcuticular 4-0 Vicryl suture. Dermabond and Steri-strips were applied to both incisions. Dressings were placed. The patient tolerated the procedure well without immediate post procedural complication.  FINDINGS: After catheter placement, the tip lies within the superior cavoatrial junction. The catheter aspirates and flushes normally and is ready for immediate use.  IMPRESSION: Successful placement of a right internal jugular approach power injectable Port-A-Cath. The catheter is ready for immediate use.   Electronically Signed   By: Sandi Mariscal M.D.   On: 02/12/2014 16:11   Ir US Guide Vasc Access Right  02/12/2014   INDICATION: History of mantle cell lymphoma, in need of intravenous access for chemotherapy administration.  EXAM: IMPLANTED PORT A CATH PLACEMENT WITH ULTRASOUND AND FLUOROSCOPIC GUIDANCE  COMPARISON:  None.  MEDICATIONS: Ancef 3 gm IV; IV antibiotic was given in an appropriate time interval prior to skin puncture.  ANESTHESIA/SEDATION: Versed 5 mg IV; Fentanyl 100 mcg IV;  Total Moderate Sedation Time  37  minutes.  CONTRAST:  None  FLUOROSCOPY TIME:  18 seconds  COMPLICATIONS: None immediate  PROCEDURE: The procedure, risks, benefits, and alternatives were explained to the patient. Questions regarding the procedure were encouraged and answered. The patient understands and consents to the procedure.  The right neck and chest were prepped with chlorhexidine in a sterile fashion, and a sterile drape was applied covering the operative field. Maximum barrier sterile technique with sterile gowns and gloves were used for the procedure. A timeout was performed prior to the initiation of the procedure. Local anesthesia was provided with 1% lidocaine with epinephrine.  After creating a small venotomy incision, a micropuncture kit was utilized to access the internal jugular vein under direct, real-time ultrasound guidance. Ultrasound  image documentation was performed. The microwire was kinked to measure appropriate catheter length.  A subcutaneous port pocket was then created along the upper chest wall utilizing a combination of sharp and blunt dissection. The pocket was irrigated with sterile saline. A single lumen ISP power injectable port was chosen for placement. The 8 Fr catheter was tunneled from the port pocket site to the venotomy incision. The port was placed in the pocket. The external catheter was trimmed to appropriate length. At the venotomy, an 8 Fr peel-away sheath was placed over a guidewire under fluoroscopic guidance. The catheter was then placed through the sheath and the sheath was removed. Final catheter positioning was confirmed and documented  with a fluoroscopic spot radiograph. The port was accessed with a Huber needle, aspirated and flushed with heparinized saline.  The venotomy site was closed with an interrupted 4-0 Vicryl suture. The port pocket incision was closed with interrupted 2-0 Vicryl suture and the skin was opposed with a running subcuticular 4-0 Vicryl suture. Dermabond and Steri-strips were applied to both incisions. Dressings were placed. The patient tolerated the procedure well without immediate post procedural complication.  FINDINGS: After catheter placement, the tip lies within the superior cavoatrial junction. The catheter aspirates and flushes normally and is ready for immediate use.  IMPRESSION: Successful placement of a right internal jugular approach power injectable Port-A-Cath. The catheter is ready for immediate use.   Electronically Signed   By: Sandi Mariscal M.D.   On: 02/12/2014 16:11    ASSESSMENT:  #1. Mantle cell lymphoma, at least stage III, for peripheral blood flow cytometry today with thrombocytopenia, probably on an immune basis, lymphoma cells documented on peripheral blood flow cytometry. #2. Chronic obstructive pulmonary disease.  #3. Degenerative joint disease lumbar spine.   #4. Coronary artery disease without evidence of heart failure or dysrhythmia.  #5. Lower extremity varicosities.  #6. Obstructive sleep apnea syndrome.  #7. Restless leg syndrome.  #8. Insomnia with obstructive sleep apnea, on CPAP.      PLAN:  #1. Bendamustine/Rituxan with Neulasta support cycle #1 to start today. #2. Increased temazepam 30 mg at bedtime. #3. Office visit 10 days with CBC.   All questions were answered. The patient knows to call the clinic with any problems, questions or concerns. We can certainly see the patient much sooner if necessary.   I spent 25 minutes counseling the patient face to face. The total time spent in the appointment was 30 minutes.    Doroteo Bradford, MD 02/26/2014 8:58 AM  DISCLAIMER:  This note was dictated with voice recognition software.  Similar sounding words can inadvertently be transcribed inaccurately and may not be corrected upon review.

## 2014-02-26 NOTE — Progress Notes (Signed)
Patient tolerated chemotherapy well. Declined offer for wheelchair to lobby. Port-a-cath dressing reinforced.

## 2014-02-26 NOTE — Patient Instructions (Signed)
..  [facility] Discharge Instructions  RECOMMENDATIONS MADE BY THE CONSULTANT AND ANY TEST RESULTS WILL BE SENT TO YOUR REFERRING PHYSICIAN.  EXAM FINDINGS BY THE PHYSICIAN TODAY AND SIGNS OR SYMPTOMS TO REPORT TO CLINIC OR PRIMARY PHYSICIAN: Exam and findings as discussed by Dr. Barnet Glasgow.  MEDICATIONS PRESCRIBED:  Double the temazepam dose  INSTRUCTIONS/FOLLOW-UP: 10 days for cbc and to see the Dr.   Lujean Rave you for choosing [facility] to provide your oncology and hematology care.  To afford each patient quality time with our providers, please arrive at least 15 minutes before your scheduled appointment time.  With your help, our goal is to use those 15 minutes to complete the necessary work-up to ensure our physicians have the information they need to help with your evaluation and healthcare recommendations.    Effective January 1st, 2014, we ask that you re-schedule your appointment with our physicians should you arrive 10 or more minutes late for your appointment.  We strive to give you quality time with our providers, and arriving late affects you and other patients whose appointments are after yours.    Again, thank you for choosing [facility].  Our hope is that these requests will decrease the amount of time that you wait before being seen by our physicians.       _____________________________________________________________  Should you have questions after your visit to [facility], please contact our office at [phone] between the hours of 8:30 a.m. and 4:30 p.m.  Voicemails left after 4:30 p.m. will not be returned until the following business day.  For prescription refill requests, have your pharmacy contact our office with your prescription refill request.    _______________________________________________________________  We hope that we have given you very good care.  You may receive a patient satisfaction survey  in the mail, please complete it and return it as soon as possible.  We value your feedback!  _______________________________________________________________  Have you asked about our STAR program?  STAR stands for Survivorship Training and Rehabilitation, and this is a nationally recognized cancer care program that focuses on survivorship and rehabilitation.  Cancer and cancer treatments may cause problems, such as, pain, making you feel tired and keeping you from doing the things that you need or want to do. Cancer rehabilitation can help. Our goal is to reduce these troubling effects and help you have the best quality of life possible.  You may receive a survey from a nurse that asks questions about your current state of health.  Based on the survey results, all eligible patients will be referred to the [facility] program for an evaluation so we can better serve you!  A frequently asked questions sheet is available upon request.

## 2014-02-27 ENCOUNTER — Encounter (HOSPITAL_COMMUNITY): Payer: Medicare Other | Attending: Hematology and Oncology

## 2014-02-27 VITALS — BP 133/65 | HR 72 | Temp 98.4°F | Resp 18

## 2014-02-27 DIAGNOSIS — Z5111 Encounter for antineoplastic chemotherapy: Secondary | ICD-10-CM

## 2014-02-27 DIAGNOSIS — C8318 Mantle cell lymphoma, lymph nodes of multiple sites: Secondary | ICD-10-CM | POA: Insufficient documentation

## 2014-02-27 MED ORDER — SODIUM CHLORIDE 0.9 % IV SOLN: 16.0000 mg | Freq: Once | INTRAVENOUS | Status: DC

## 2014-02-27 MED ORDER — SODIUM CHLORIDE 0.9 % IJ SOLN
10.0000 mL | INTRAMUSCULAR | Status: DC | PRN
  Administered 2014-02-27: 10 mL

## 2014-02-27 MED ORDER — HEPARIN SOD (PORK) LOCK FLUSH 100 UNIT/ML IV SOLN
500.0000 [IU] | Freq: Once | INTRAVENOUS | Status: AC | PRN
  Administered 2014-02-27: 500 [IU]
  Filled 2014-02-27: qty 5

## 2014-02-27 MED ORDER — SODIUM CHLORIDE 0.9 % IV SOLN
Freq: Once | INTRAVENOUS | Status: AC
Start: 2014-02-27 — End: 2014-02-27
  Administered 2014-02-27: 10:00:00 via INTRAVENOUS

## 2014-02-27 MED ORDER — SODIUM CHLORIDE 0.9 % IV SOLN
6.0000 mg | Freq: Once | INTRAVENOUS | Status: AC
  Administered 2014-02-27: 6 mg via INTRAVENOUS
  Filled 2014-02-27: qty 4

## 2014-02-27 MED ORDER — SODIUM CHLORIDE 0.9 % IV SOLN
70.0000 mg/m2 | Freq: Once | INTRAVENOUS | Status: AC
  Administered 2014-02-27: 180 mg via INTRAVENOUS
  Filled 2014-02-27: qty 2

## 2014-02-27 MED ORDER — DEXAMETHASONE SODIUM PHOSPHATE 10 MG/ML IJ SOLN: 10.0000 mg | Freq: Once | INTRAMUSCULAR | Status: DC

## 2014-02-27 MED ORDER — SODIUM CHLORIDE 0.9 % IV SOLN
Freq: Once | INTRAVENOUS | Status: AC
  Administered 2014-02-27: 16 mg via INTRAVENOUS
  Filled 2014-02-27: qty 8

## 2014-02-27 MED ORDER — FLURAZEPAM HCL 30 MG PO CAPS: 30.0000 mg | ORAL_CAPSULE | Freq: Every evening | ORAL | Status: DC | PRN

## 2014-02-27 NOTE — Patient Instructions (Signed)
[facility] Discharge Instructions for Patients Receiving Chemotherapy  Today you received the following chemotherapy agents Day 2 bendamustine. Completed cycle 1 chemotherapy. You were given a Aanchal Cope prescription for a sleeping pill. Return to clinic tomorrow as scheduled for Neulasta injection. Report any issues/concerns to clinic as needed. Keep all appointments as scheduled. To help prevent nausea and vomiting after your treatment, we encourage you to take your nausea medication as instructed. If you develop nausea and vomiting that is not controlled by your nausea medication, call the clinic. If it is after clinic hours your family physician or the after hours number for the clinic or go to the Emergency Department.   BELOW ARE SYMPTOMS THAT SHOULD BE REPORTED IMMEDIATELY:  *FEVER GREATER THAN 101.0 F  *CHILLS WITH OR WITHOUT FEVER  NAUSEA AND VOMITING THAT IS NOT CONTROLLED WITH YOUR NAUSEA MEDICATION  *UNUSUAL SHORTNESS OF BREATH  *UNUSUAL BRUISING OR BLEEDING  TENDERNESS IN MOUTH AND THROAT WITH OR WITHOUT PRESENCE OF ULCERS  *URINARY PROBLEMS  *BOWEL PROBLEMS  UNUSUAL RASH Items with * indicate a potential emergency and should be followed up as soon as possible.  One of the nurses will contact you 24 hours after your treatment. Please let the nurse know about any problems that you may have experienced. Feel free to call the clinic you have any questions or concerns. The clinic phone number is [phone].   I have been informed and understand all the instructions given to me. I know to contact the clinic, my physician, or go to the Emergency Department if any problems should occur. I do not have any questions at this time, but understand that I may call the clinic during office hours or the Patient Navigator at [phone] should I have any questions or need assistance in obtaining follow up care.    __________________________________________   _____________  __________ Signature of Patient or Authorized Representative            Date                   Time    __________________________________________ Nurse's Signature

## 2014-02-27 NOTE — Progress Notes (Signed)
Tolerated well

## 2014-02-28 ENCOUNTER — Encounter (HOSPITAL_BASED_OUTPATIENT_CLINIC_OR_DEPARTMENT_OTHER): Payer: Medicare Other

## 2014-02-28 VITALS — BP 158/110 | HR 58 | Temp 98.5°F | Resp 20

## 2014-02-28 DIAGNOSIS — Z5189 Encounter for other specified aftercare: Secondary | ICD-10-CM

## 2014-02-28 DIAGNOSIS — C8318 Mantle cell lymphoma, lymph nodes of multiple sites: Secondary | ICD-10-CM

## 2014-02-28 MED ORDER — PEGFILGRASTIM INJECTION 6 MG/0.6ML
6.0000 mg | Freq: Once | SUBCUTANEOUS | Status: AC
  Administered 2014-02-28: 6 mg via SUBCUTANEOUS

## 2014-02-28 MED ORDER — PEGFILGRASTIM INJECTION 6 MG/0.6ML
SUBCUTANEOUS | Status: AC
  Filled 2014-02-28: qty 0.6

## 2014-02-28 NOTE — Progress Notes (Signed)
[name] [name] presents today for injection per MD orders. Neulasta 6mg  administered SQ in right Abdomen. Administration without incident. Patient tolerated well.

## 2014-03-05 ENCOUNTER — Encounter (HOSPITAL_BASED_OUTPATIENT_CLINIC_OR_DEPARTMENT_OTHER): Payer: Medicare Other

## 2014-03-05 ENCOUNTER — Encounter (HOSPITAL_COMMUNITY): Payer: Self-pay

## 2014-03-05 VITALS — BP 150/78 | HR 81 | Temp 98.3°F | Resp 22 | Wt 301.2 lb

## 2014-03-05 DIAGNOSIS — J449 Chronic obstructive pulmonary disease, unspecified: Secondary | ICD-10-CM

## 2014-03-05 DIAGNOSIS — G4700 Insomnia, unspecified: Secondary | ICD-10-CM

## 2014-03-05 DIAGNOSIS — C8318 Mantle cell lymphoma, lymph nodes of multiple sites: Secondary | ICD-10-CM

## 2014-03-05 DIAGNOSIS — C8319 Mantle cell lymphoma, extranodal and solid organ sites: Secondary | ICD-10-CM

## 2014-03-05 DIAGNOSIS — J309 Allergic rhinitis, unspecified: Secondary | ICD-10-CM | POA: Insufficient documentation

## 2014-03-05 DIAGNOSIS — J302 Other seasonal allergic rhinitis: Secondary | ICD-10-CM

## 2014-03-05 LAB — CBC WITH DIFFERENTIAL/PLATELET
Basophils Absolute: 0.1 10*3/uL (ref 0.0–0.1)
Basophils Relative: 0 % (ref 0–1)
EOS PCT: 0 % (ref 0–5)
Eosinophils Absolute: 0.1 10*3/uL (ref 0.0–0.7)
HCT: 45 % (ref 39.0–52.0)
Hemoglobin: 15.2 g/dL (ref 13.0–17.0)
LYMPHS PCT: 5 % — AB (ref 12–46)
Lymphs Abs: 1.2 10*3/uL (ref 0.7–4.0)
MCH: 30.3 pg (ref 26.0–34.0)
MCHC: 33.8 g/dL (ref 30.0–36.0)
MCV: 89.8 fL (ref 78.0–100.0)
MONO ABS: 0.4 10*3/uL (ref 0.1–1.0)
MONOS PCT: 2 % — AB (ref 3–12)
Neutro Abs: 21 10*3/uL — ABNORMAL HIGH (ref 1.7–7.7)
Neutrophils Relative %: 92 % — ABNORMAL HIGH (ref 43–77)
Platelets: 143 10*3/uL — ABNORMAL LOW (ref 150–400)
RBC: 5.01 MIL/uL (ref 4.22–5.81)
RDW: 15.8 % — ABNORMAL HIGH (ref 11.5–15.5)
WBC Morphology: INCREASED
WBC: 22.7 10*3/uL — AB (ref 4.0–10.5)

## 2014-03-05 MED ORDER — LORATADINE-PSEUDOEPHEDRINE ER 10-240 MG PO TB24: 1.0000 | ORAL_TABLET | Freq: Every day | ORAL | Status: DC

## 2014-03-05 NOTE — Patient Instructions (Signed)
[facility] Discharge Instructions  RECOMMENDATIONS MADE BY THE CONSULTANT AND ANY TEST RESULTS WILL BE SENT TO YOUR REFERRING PHYSICIAN.  EXAM FINDINGS BY THE PHYSICIAN TODAY AND SIGNS OR SYMPTOMS TO REPORT TO CLINIC OR PRIMARY PHYSICIAN: Exam and findings as discussed by Dr. Barnet Glasgow.  You are doing well at present.  Will give you something to help with your sinuses. Report fevers, chills, uncontrolled nausea or vomiting or other problems.  MEDICATIONS PRESCRIBED:  Claritin D - e-scribed.  Take as directed.  INSTRUCTIONS/FOLLOW-UP: Follow-up with next treatment.  Thank you for choosing [facility] to provide your oncology and hematology care.  To afford each patient quality time with our providers, please arrive at least 15 minutes before your scheduled appointment time.  With your help, our goal is to use those 15 minutes to complete the necessary work-up to ensure our physicians have the information they need to help with your evaluation and healthcare recommendations.    Effective January 1st, 2014, we ask that you re-schedule your appointment with our physicians should you arrive 10 or more minutes late for your appointment.  We strive to give you quality time with our providers, and arriving late affects you and other patients whose appointments are after yours.    Again, thank you for choosing [facility].  Our hope is that these requests will decrease the amount of time that you wait before being seen by our physicians.       _____________________________________________________________  Should you have questions after your visit to [facility], please contact our office at [phone] between the hours of 8:30 a.m. and 4:30 p.m.  Voicemails left after 4:30 p.m. will not be returned until the following business day.  For prescription refill requests, have your pharmacy contact our office with your prescription refill  request.    _______________________________________________________________  We hope that we have given you very good care.  You may receive a patient satisfaction survey in the mail, please complete it and return it as soon as possible.  We value your feedback!  _______________________________________________________________  Have you asked about our STAR program?  STAR stands for Survivorship Training and Rehabilitation, and this is a nationally recognized cancer care program that focuses on survivorship and rehabilitation.  Cancer and cancer treatments may cause problems, such as, pain, making you feel tired and keeping you from doing the things that you need or want to do. Cancer rehabilitation can help. Our goal is to reduce these troubling effects and help you have the best quality of life possible.  You may receive a survey from a nurse that asks questions about your current state of health.  Based on the survey results, all eligible patients will be referred to the [facility] program for an evaluation so we can better serve you!  A frequently asked questions sheet is available upon request.

## 2014-03-05 NOTE — Progress Notes (Signed)
[facility]  OFFICE PROGRESS NOTE  Alonza Bogus, MD [place] [id number]  DIAGNOSIS: No diagnosis found.  Chief Complaint  Patient presents with  . Mantle cell lymphoma    CURRENT THERAPY: Bendamustine/Rituxan with Neulasta support started 02/26/2014.  INTERVAL HISTORY: [name] [name] 73 y.o. 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  Flurazepam has helped him sleep better without next day drowsiness. Appetite is good with no nausea or vomiting. He does have sinus congestion with cough without expectoration but with frontal headache for the last 3 or 4 days. He denies any PND, orthopnea, palpitations, other bone pain, lower extremity swelling or redness, diarrhea, constipation, melena, hematochezia, hematuria, skin rash, or seizures.  MEDICAL HISTORY: Past Medical History  Diagnosis Date  . Hypertension   . Hyperlipidemia   . Rhinitis   . Peripheral edema   . Osteoarthritis   . Obesity     morbid  . CAD (coronary artery disease)     s/p angioplasty  . ED (erectile dysfunction)   . OSA (obstructive sleep apnea)     severe, on CPAP 12  . Restless leg syndrome   . Insomnia   . Anxiety   . Bilateral lower extremity edema   . 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    ALLERGIES:  has No Known Allergies.  MEDICATIONS: has a current medication list which includes  the following prescription(s): acetaminophen, acyclovir, allopurinol, celecoxib, clonazepam, diphenhydramine, flurazepam, gabapentin, lidocaine-prilocaine, multivitamin with minerals, oxycodone-acetaminophen, prochlorperazine, ropinirole, temazepam, tizanidine, and loratadine-pseudoephedrine.  SURGICAL HISTORY:  Past Surgical History  Procedure Laterality Date  . Total knee arthroplasty      left  . Hemorrhoid surgery    . Back surgery  10/2009  . Eye surgery      lazer bil  . Coronary angioplasty    . Total knee arthroplasty  08/04/2012    right knee  . Total knee arthroplasty  08/04/2012    Procedure: TOTAL KNEE ARTHROPLASTY;  Surgeon: Yvette Rack., MD;  Location: [facility];  Service: Orthopedics;  Laterality: Right;  WITH PATELLA RESURFACING  . Lymph node biopsy Right 01/2014    groin area    FAMILY HISTORY: family history is negative for Coronary artery disease.  SOCIAL HISTORY:  reports that he has been smoking Cigarettes.  He has a 55 pack-year smoking history. He has never used smokeless tobacco. He reports that he does not drink alcohol or use illicit drugs.  REVIEW OF SYSTEMS:  Other than that discussed above is noncontributory.  PHYSICAL EXAMINATION: ECOG PERFORMANCE STATUS: 1 - Symptomatic but completely ambulatory  Blood pressure 150/78, pulse 81, temperature 98.3 F (36.8 C), temperature source Oral, resp. rate 22, weight 301 lb 3.2 oz (136.623 kg).  GENERAL:alert, no distress and comfortable. Morbidly obese. SKIN: skin color, texture, turgor are normal, no rashes or significant lesions EYES: PERLA; Conjunctiva are pink and non-injected, sclera clear SINUSES: No redness or tenderness over maxillary or ethmoid sinuses. No evidence  of otitis. Rhinorrhea present. OROPHARYNX:no exudate, no erythema on lips, buccal mucosa, or tongue. NECK: supple, thyroid normal size, non-tender, without nodularity. No masses CHEST: Increased AP diameter with no breast masses. LifePort in  place. LYMPH:  no palpable lymphadenopathy in the cervical, axillary or inguinal LUNGS: clear to auscultation and percussion with normal breathing effort HEART: regular rate & rhythm and no murmurs. ABDOMEN:abdomen soft, non-tender and normal bowel sounds MUSCULOSKELETAL:no cyanosis of digits and no clubbing. Range of motion normal.  NEURO: alert & oriented x 3 with fluent speech, no focal motor/sensory deficits   LABORATORY DATA: Lab on 03/05/2014  Component Date Value Ref Range Status  . WBC 03/05/2014 22.7* 4.0 - 10.5 K/uL Final  . RBC 03/05/2014 5.01  4.22 - 5.81 MIL/uL Final  . Hemoglobin 03/05/2014 15.2  13.0 - 17.0 g/dL Final  . HCT 03/05/2014 45.0  39.0 - 52.0 % Final  . MCV 03/05/2014 89.8  78.0 - 100.0 fL Final  . MCH 03/05/2014 30.3  26.0 - 34.0 pg Final  . MCHC 03/05/2014 33.8  30.0 - 36.0 g/dL Final  . RDW 03/05/2014 15.8* 11.5 - 15.5 % Final  . Platelets 03/05/2014 143* 150 - 400 K/uL Final  . Neutrophils Relative % 03/05/2014 92* 43 - 77 % Final  . Neutro Abs 03/05/2014 21.0* 1.7 - 7.7 K/uL Final  . Lymphocytes Relative 03/05/2014 5* 12 - 46 % Final  . Lymphs Abs 03/05/2014 1.2  0.7 - 4.0 K/uL Final  . Monocytes Relative 03/05/2014 2* 3 - 12 % Final  . Monocytes Absolute 03/05/2014 0.4  0.1 - 1.0 K/uL Final  . Eosinophils Relative 03/05/2014 0  0 - 5 % Final  . Eosinophils Absolute 03/05/2014 0.1  0.0 - 0.7 K/uL Final  . Basophils Relative 03/05/2014 0  0 - 1 % Final  . Basophils Absolute 03/05/2014 0.1  0.0 - 0.1 K/uL Final  . WBC Morphology 03/05/2014 INCREASED BANDS (>20% BANDS)   Final  Infusion on 02/26/2014  Component Date Value Ref Range Status  . WBC 02/26/2014 4.3  4.0 - 10.5 K/uL Final  . RBC 02/26/2014 4.57  4.22 - 5.81 MIL/uL Final  . Hemoglobin 02/26/2014 13.9  13.0 - 17.0 g/dL Final  . HCT 02/26/2014 40.7  39.0 - 52.0 % Final  . MCV 02/26/2014 89.1  78.0 - 100.0 fL Final  . MCH 02/26/2014 30.4  26.0 - 34.0 pg Final  . MCHC 02/26/2014 34.2  30.0 -  36.0 g/dL Final  . RDW 02/26/2014 14.7  11.5 - 15.5 % Final  . Platelets 02/26/2014 124* 150 - 400 K/uL Final  . Neutrophils Relative % 02/26/2014 71  43 - 77 % Final  . Neutro Abs 02/26/2014 3.1  1.7 - 7.7 K/uL Final  . Lymphocytes Relative 02/26/2014 20  12 - 46 % Final  . Lymphs Abs 02/26/2014 0.9  0.7 - 4.0 K/uL Final  . Monocytes Relative 02/26/2014 7  3 - 12 % Final  . Monocytes Absolute 02/26/2014 0.3  0.1 - 1.0 K/uL Final  . Eosinophils Relative 02/26/2014 1  0 - 5 % Final  . Eosinophils Absolute 02/26/2014 0.1  0.0 - 0.7 K/uL Final  . Basophils Relative 02/26/2014 1  0 - 1 % Final  . Basophils Absolute 02/26/2014 0.0  0.0 - 0.1 K/uL Final  . Sodium 02/26/2014 140  137 - 147 mEq/L Final  . Potassium 02/26/2014 4.3  3.7 - 5.3 mEq/L Final  . Chloride 02/26/2014 103  96 - 112 mEq/L Final  .  CO2 02/26/2014 26  19 - 32 mEq/L Final  . Glucose, Bld 02/26/2014 120* 70 - 99 mg/dL Final  . BUN 02/26/2014 19  6 - 23 mg/dL Final  . Creatinine, Ser 02/26/2014 0.81  0.50 - 1.35 mg/dL Final  . Calcium 02/26/2014 8.7  8.4 - 10.5 mg/dL Final  . Total Protein 02/26/2014 6.8  6.0 - 8.3 g/dL Final  . Albumin 02/26/2014 3.6  3.5 - 5.2 g/dL Final  . AST 02/26/2014 10  0 - 37 U/L Final  . ALT 02/26/2014 8  0 - 53 U/L Final  . Alkaline Phosphatase 02/26/2014 86  39 - 117 U/L Final  . Total Bilirubin 02/26/2014 0.4  0.3 - 1.2 mg/dL Final  . GFR calc non Af Amer 02/26/2014 87* >90 mL/min Final  . GFR calc Af Amer 02/26/2014 >90  >90 mL/min Final   Comment: (NOTE)                          The eGFR has been calculated using the CKD EPI equation.                          This calculation has not been validated in all clinical situations.                          eGFR's persistently <90 mL/min signify possible Chronic Kidney                          Disease.  Marland Kitchen Uric Acid, Serum 02/26/2014 3.6* 4.0 - 7.8 mg/dL Final  Lab on 02/14/2014  Component Date Value Ref Range Status  . Hep B C IgM 02/14/2014 NON  REACTIVE  NON REACTIVE Final   Comment: (NOTE)                          High levels of Hepatitis B Core IgM antibody are detectable                          during the acute stage of Hepatitis B. This antibody is used                          to differentiate current from past HBV infection.                          Performed at [facility]  . Hepatitis B Surface Ag 02/14/2014 NEGATIVE  NEGATIVE Final   Performed at [facility] Visit on 02/12/2014  Component Date Value Ref Range Status  . WBC 02/12/2014 5.3  4.0 - 10.5 K/uL Final  . RBC 02/12/2014 4.93  4.22 - 5.81 MIL/uL Final  . Hemoglobin 02/12/2014 14.8  13.0 - 17.0 g/dL Final  . HCT 02/12/2014 43.4  39.0 - 52.0 % Final  . MCV 02/12/2014 88.0  78.0 - 100.0 fL Final  . MCH 02/12/2014 30.0  26.0 - 34.0 pg Final  . MCHC 02/12/2014 34.1  30.0 - 36.0 g/dL Final  . RDW 02/12/2014 14.3  11.5 - 15.5 % Final  . Platelets 02/12/2014 116* 150 - 400 K/uL Final   Comment: SPECIMEN CHECKED FOR CLOTS  REPEATED TO VERIFY                          PLATELET COUNT CONFIRMED BY SMEAR  Office Visit on 02/06/2014  Component Date Value Ref Range Status  . WBC 02/06/2014 5.0  4.0 - 10.5 K/uL Final  . RBC 02/06/2014 5.00  4.22 - 5.81 MIL/uL Final  . Hemoglobin 02/06/2014 15.1  13.0 - 17.0 g/dL Final  . HCT 02/06/2014 44.3  39.0 - 52.0 % Final  . MCV 02/06/2014 88.6  78.0 - 100.0 fL Final  . MCH 02/06/2014 30.2  26.0 - 34.0 pg Final  . MCHC 02/06/2014 34.1  30.0 - 36.0 g/dL Final  . RDW 02/06/2014 14.5  11.5 - 15.5 % Final  . Platelets 02/06/2014 125* 150 - 400 K/uL Final  . Neutrophils Relative % 02/06/2014 75  43 - 77 % Final  . Neutro Abs 02/06/2014 3.8  1.7 - 7.7 K/uL Final  . Lymphocytes Relative 02/06/2014 15  12 - 46 % Final  . Lymphs Abs 02/06/2014 0.8  0.7 - 4.0 K/uL Final  . Monocytes Relative 02/06/2014 9  3 - 12 % Final  . Monocytes Absolute 02/06/2014 0.4  0.1 - 1.0 K/uL Final   . Eosinophils Relative 02/06/2014 1  0 - 5 % Final  . Eosinophils Absolute 02/06/2014 0.1  0.0 - 0.7 K/uL Final  . Basophils Relative 02/06/2014 0  0 - 1 % Final  . Basophils Absolute 02/06/2014 0.0  0.0 - 0.1 K/uL Final  . Retic Ct Pct 02/06/2014 1.3  0.4 - 3.1 % Final  . RBC. 02/06/2014 5.00  4.22 - 5.81 MIL/uL Final  . Retic Count, Manual 02/06/2014 65.0  19.0 - 186.0 K/uL Final  . Sodium 02/06/2014 136* 137 - 147 mEq/L Final  . Potassium 02/06/2014 4.2  3.7 - 5.3 mEq/L Final  . Chloride 02/06/2014 99  96 - 112 mEq/L Final  . CO2 02/06/2014 24  19 - 32 mEq/L Final  . Glucose, Bld 02/06/2014 117* 70 - 99 mg/dL Final  . BUN 02/06/2014 19  6 - 23 mg/dL Final  . Creatinine, Ser 02/06/2014 0.91  0.50 - 1.35 mg/dL Final  . Calcium 02/06/2014 9.2  8.4 - 10.5 mg/dL Final  . Total Protein 02/06/2014 7.4  6.0 - 8.3 g/dL Final  . Albumin 02/06/2014 4.0  3.5 - 5.2 g/dL Final  . AST 02/06/2014 13  0 - 37 U/L Final  . ALT 02/06/2014 9  0 - 53 U/L Final  . Alkaline Phosphatase 02/06/2014 87  39 - 117 U/L Final  . Total Bilirubin 02/06/2014 0.5  0.3 - 1.2 mg/dL Final  . GFR calc non Af Amer 02/06/2014 83* >90 mL/min Final  . GFR calc Af Amer 02/06/2014 >90  >90 mL/min Final   Comment: (NOTE)                          The eGFR has been calculated using the CKD EPI equation.                          This calculation has not been validated in all clinical situations.                          eGFR's persistently <90 mL/min signify possible Chronic Kidney  Disease.  Marland Kitchen LDH 02/06/2014 168  94 - 250 U/L Final   HEMOLYZED SPECIMEN, RESULTS MAY BE AFFECTED  . Beta-2 Microglobulin 02/06/2014 3.64* <=2.51 mg/L Final   Performed at [facility]: No new pathology.  Urinalysis    Component Value Date/Time   COLORURINE AMBER* 07/28/2012 1230   APPEARANCEUR CLEAR 07/28/2012 1230   LABSPEC 1.032* 07/28/2012 1230   PHURINE 5.5 07/28/2012 1230   GLUCOSEU  NEGATIVE 07/28/2012 1230   HGBUR NEGATIVE 07/28/2012 1230   BILIRUBINUR SMALL* 07/28/2012 1230   KETONESUR 15* 07/28/2012 1230   PROTEINUR NEGATIVE 07/28/2012 1230   UROBILINOGEN 1.0 07/28/2012 1230   NITRITE NEGATIVE 07/28/2012 1230   LEUKOCYTESUR TRACE* 07/28/2012 1230    RADIOGRAPHIC STUDIES: Ir Fluoro Guide Cv Line Right  02/12/2014   INDICATION: History of mantle cell lymphoma, in need of intravenous access for chemotherapy administration.  EXAM: IMPLANTED PORT A CATH PLACEMENT WITH ULTRASOUND AND FLUOROSCOPIC GUIDANCE  COMPARISON:  None.  MEDICATIONS: Ancef 3 gm IV; IV antibiotic was given in an appropriate time interval prior to skin puncture.  ANESTHESIA/SEDATION: Versed 5 mg IV; Fentanyl 100 mcg IV;  Total Moderate Sedation Time  37  minutes.  CONTRAST:  None  FLUOROSCOPY TIME:  18 seconds  COMPLICATIONS: None immediate  PROCEDURE: The procedure, risks, benefits, and alternatives were explained to the patient. Questions regarding the procedure were encouraged and answered. The patient understands and consents to the procedure.  The right neck and chest were prepped with chlorhexidine in a sterile fashion, and a sterile drape was applied covering the operative field. Maximum barrier sterile technique with sterile gowns and gloves were used for the procedure. A timeout was performed prior to the initiation of the procedure. Local anesthesia was provided with 1% lidocaine with epinephrine.  After creating a small venotomy incision, a micropuncture kit was utilized to access the internal jugular vein under direct, real-time ultrasound guidance. Ultrasound image documentation was performed. The microwire was kinked to measure appropriate catheter length.  A subcutaneous port pocket was then created along the upper chest wall utilizing a combination of sharp and blunt dissection. The pocket was irrigated with sterile saline. A single lumen ISP power injectable port was chosen for placement. The 8 Fr  catheter was tunneled from the port pocket site to the venotomy incision. The port was placed in the pocket. The external catheter was trimmed to appropriate length. At the venotomy, an 8 Fr peel-away sheath was placed over a guidewire under fluoroscopic guidance. The catheter was then placed through the sheath and the sheath was removed. Final catheter positioning was confirmed and documented with a fluoroscopic spot radiograph. The port was accessed with a Huber needle, aspirated and flushed with heparinized saline.  The venotomy site was closed with an interrupted 4-0 Vicryl suture. The port pocket incision was closed with interrupted 2-0 Vicryl suture and the skin was opposed with a running subcuticular 4-0 Vicryl suture. Dermabond and Steri-strips were applied to both incisions. Dressings were placed. The patient tolerated the procedure well without immediate post procedural complication.  FINDINGS: After catheter placement, the tip lies within the superior cavoatrial junction. The catheter aspirates and flushes normally and is ready for immediate use.  IMPRESSION: Successful placement of a right internal jugular approach power injectable Port-A-Cath. The catheter is ready for immediate use.   Electronically Signed   By: Sandi Mariscal M.D.   On: 02/12/2014 16:11   Ir US Guide Vasc Access Right  02/12/2014  INDICATION: History of mantle cell lymphoma, in need of intravenous access for chemotherapy administration.  EXAM: IMPLANTED PORT A CATH PLACEMENT WITH ULTRASOUND AND FLUOROSCOPIC GUIDANCE  COMPARISON:  None.  MEDICATIONS: Ancef 3 gm IV; IV antibiotic was given in an appropriate time interval prior to skin puncture.  ANESTHESIA/SEDATION: Versed 5 mg IV; Fentanyl 100 mcg IV;  Total Moderate Sedation Time  37  minutes.  CONTRAST:  None  FLUOROSCOPY TIME:  18 seconds  COMPLICATIONS: None immediate  PROCEDURE: The procedure, risks, benefits, and alternatives were explained to the patient. Questions regarding  the procedure were encouraged and answered. The patient understands and consents to the procedure.  The right neck and chest were prepped with chlorhexidine in a sterile fashion, and a sterile drape was applied covering the operative field. Maximum barrier sterile technique with sterile gowns and gloves were used for the procedure. A timeout was performed prior to the initiation of the procedure. Local anesthesia was provided with 1% lidocaine with epinephrine.  After creating a small venotomy incision, a micropuncture kit was utilized to access the internal jugular vein under direct, real-time ultrasound guidance. Ultrasound image documentation was performed. The microwire was kinked to measure appropriate catheter length.  A subcutaneous port pocket was then created along the upper chest wall utilizing a combination of sharp and blunt dissection. The pocket was irrigated with sterile saline. A single lumen ISP power injectable port was chosen for placement. The 8 Fr catheter was tunneled from the port pocket site to the venotomy incision. The port was placed in the pocket. The external catheter was trimmed to appropriate length. At the venotomy, an 8 Fr peel-away sheath was placed over a guidewire under fluoroscopic guidance. The catheter was then placed through the sheath and the sheath was removed. Final catheter positioning was confirmed and documented with a fluoroscopic spot radiograph. The port was accessed with a Huber needle, aspirated and flushed with heparinized saline.  The venotomy site was closed with an interrupted 4-0 Vicryl suture. The port pocket incision was closed with interrupted 2-0 Vicryl suture and the skin was opposed with a running subcuticular 4-0 Vicryl suture. Dermabond and Steri-strips were applied to both incisions. Dressings were placed. The patient tolerated the procedure well without immediate post procedural complication.  FINDINGS: After catheter placement, the tip lies within  the superior cavoatrial junction. The catheter aspirates and flushes normally and is ready for immediate use.  IMPRESSION: Successful placement of a right internal jugular approach power injectable Port-A-Cath. The catheter is ready for immediate use.   Electronically Signed   By: Sandi Mariscal M.D.   On: 02/12/2014 16:11    ASSESSMENT:  #1. Mantle cell lymphoma, at least stage III, good tolerance of initial chemotherapy. #2. Chronic obstructive pulmonary disease.  #3. Degenerative joint disease lumbar spine.  #4. Coronary artery disease without evidence of heart failure or dysrhythmia.  #5. Lower extremity varicosities.  #6. Obstructive sleep apnea syndrome.  #7. Restless leg syndrome.  #8. Insomnia with obstructive sleep apnea, on CPAP, improved with flurazepam. #9. Symptomatic allergic rhinitis. #10. 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   PLAN:  #1. Claritin-D 24 one daily. #2. Return on 03/26/2014 to begin cycle #2 of therapy.   All questions were answered. The patient knows to call the clinic with any problems, questions or concerns. We can certainly see the patient much sooner if necessary.   I spent 25 minutes counseling the patient face to face. The total time spent in the appointment was 30 minutes.  Doroteo Bradford, MD 03/05/2014 11:07 AM  DISCLAIMER:  This note was dictated with voice recognition software.  Similar sounding words can inadvertently be transcribed inaccurately and may not be corrected upon review.

## 2014-03-05 NOTE — Progress Notes (Signed)
LABS DRAWN FOR [facility]. 

## 2014-03-26 ENCOUNTER — Encounter (HOSPITAL_BASED_OUTPATIENT_CLINIC_OR_DEPARTMENT_OTHER): Payer: Medicare Other

## 2014-03-26 ENCOUNTER — Encounter (HOSPITAL_COMMUNITY): Payer: Self-pay

## 2014-03-26 VITALS — BP 154/84 | HR 79 | Temp 97.4°F | Resp 20 | Wt 304.0 lb

## 2014-03-26 DIAGNOSIS — C8318 Mantle cell lymphoma, lymph nodes of multiple sites: Secondary | ICD-10-CM

## 2014-03-26 DIAGNOSIS — I779 Disorder of arteries and arterioles, unspecified: Secondary | ICD-10-CM

## 2014-03-26 DIAGNOSIS — J449 Chronic obstructive pulmonary disease, unspecified: Secondary | ICD-10-CM

## 2014-03-26 DIAGNOSIS — Z5111 Encounter for antineoplastic chemotherapy: Secondary | ICD-10-CM

## 2014-03-26 DIAGNOSIS — G2581 Restless legs syndrome: Secondary | ICD-10-CM

## 2014-03-26 DIAGNOSIS — I2510 Atherosclerotic heart disease of native coronary artery without angina pectoris: Secondary | ICD-10-CM

## 2014-03-26 DIAGNOSIS — G4700 Insomnia, unspecified: Secondary | ICD-10-CM

## 2014-03-26 DIAGNOSIS — Z5112 Encounter for antineoplastic immunotherapy: Secondary | ICD-10-CM

## 2014-03-26 DIAGNOSIS — M479 Spondylosis, unspecified: Secondary | ICD-10-CM

## 2014-03-26 DIAGNOSIS — G4733 Obstructive sleep apnea (adult) (pediatric): Secondary | ICD-10-CM

## 2014-03-26 LAB — COMPREHENSIVE METABOLIC PANEL
ALBUMIN: 3.6 g/dL (ref 3.5–5.2)
ALT: 11 U/L (ref 0–53)
ANION GAP: 9 (ref 5–15)
AST: 13 U/L (ref 0–37)
Alkaline Phosphatase: 77 U/L (ref 39–117)
BILIRUBIN TOTAL: 0.5 mg/dL (ref 0.3–1.2)
BUN: 16 mg/dL (ref 6–23)
CHLORIDE: 102 meq/L (ref 96–112)
CO2: 27 mEq/L (ref 19–32)
Calcium: 8.7 mg/dL (ref 8.4–10.5)
Creatinine, Ser: 0.85 mg/dL (ref 0.50–1.35)
GFR calc non Af Amer: 86 mL/min — ABNORMAL LOW (ref >90)
GLUCOSE: 118 mg/dL — AB (ref 70–99)
Potassium: 4.3 mEq/L (ref 3.7–5.3)
Sodium: 138 mEq/L (ref 137–147)
Total Protein: 6.6 g/dL (ref 6.0–8.3)

## 2014-03-26 LAB — CBC WITH DIFFERENTIAL/PLATELET
BASOS PCT: 1 % (ref 0–1)
Basophils Absolute: 0 10*3/uL (ref 0.0–0.1)
Eosinophils Absolute: 0.2 10*3/uL (ref 0.0–0.7)
Eosinophils Relative: 4 % (ref 0–5)
HCT: 40.3 % (ref 39.0–52.0)
Hemoglobin: 14 g/dL (ref 13.0–17.0)
Lymphocytes Relative: 9 % — ABNORMAL LOW (ref 12–46)
Lymphs Abs: 0.4 10*3/uL — ABNORMAL LOW (ref 0.7–4.0)
MCH: 32.1 pg (ref 26.0–34.0)
MCHC: 34.7 g/dL (ref 30.0–36.0)
MCV: 92.4 fL (ref 78.0–100.0)
MONO ABS: 0.5 10*3/uL (ref 0.1–1.0)
Monocytes Relative: 12 % (ref 3–12)
NEUTROS ABS: 3.1 10*3/uL (ref 1.7–7.7)
Neutrophils Relative %: 74 % (ref 43–77)
Platelets: 154 10*3/uL (ref 150–400)
RBC: 4.36 MIL/uL (ref 4.22–5.81)
RDW: 16.5 % — ABNORMAL HIGH (ref 11.5–15.5)
WBC: 4.3 10*3/uL (ref 4.0–10.5)

## 2014-03-26 LAB — URIC ACID: Uric Acid, Serum: 4.5 mg/dL (ref 4.0–7.8)

## 2014-03-26 MED ORDER — FLURAZEPAM HCL 30 MG PO CAPS: ORAL_CAPSULE | ORAL | Status: DC

## 2014-03-26 MED ORDER — SODIUM CHLORIDE 0.9 % IV SOLN
Freq: Once | INTRAVENOUS | Status: AC
  Administered 2014-03-26: 16 mg via INTRAVENOUS
  Filled 2014-03-26: qty 8

## 2014-03-26 MED ORDER — SODIUM CHLORIDE 0.9 % IV SOLN
Freq: Once | INTRAVENOUS | Status: AC
Start: 2014-03-26 — End: 2014-03-26
  Administered 2014-03-26: 09:00:00 via INTRAVENOUS

## 2014-03-26 MED ORDER — ACETAMINOPHEN 325 MG PO TABS
ORAL_TABLET | ORAL | Status: AC
  Filled 2014-03-26: qty 2

## 2014-03-26 MED ORDER — SODIUM CHLORIDE 0.9 % IJ SOLN
10.0000 mL | INTRAMUSCULAR | Status: DC | PRN
  Administered 2014-03-26: 10 mL

## 2014-03-26 MED ORDER — DIPHENHYDRAMINE HCL 25 MG PO CAPS
ORAL_CAPSULE | ORAL | Status: AC
Start: 2014-03-26 — End: 2014-03-26
  Filled 2014-03-26: qty 2

## 2014-03-26 MED ORDER — SODIUM CHLORIDE 0.9 % IV SOLN: 16.0000 mg | Freq: Once | INTRAVENOUS | Status: DC

## 2014-03-26 MED ORDER — SODIUM CHLORIDE 0.9 % IV SOLN
70.0000 mg/m2 | Freq: Once | INTRAVENOUS | Status: AC
  Administered 2014-03-26: 180 mg via INTRAVENOUS
  Filled 2014-03-26: qty 2

## 2014-03-26 MED ORDER — SODIUM CHLORIDE 0.9 % IV SOLN
6.0000 mg | Freq: Once | INTRAVENOUS | Status: AC
  Administered 2014-03-26: 6 mg via INTRAVENOUS
  Filled 2014-03-26: qty 4

## 2014-03-26 MED ORDER — ACETAMINOPHEN 325 MG PO TABS
650.0000 mg | ORAL_TABLET | Freq: Once | ORAL | Status: AC
  Administered 2014-03-26: 650 mg via ORAL

## 2014-03-26 MED ORDER — HEPARIN SOD (PORK) LOCK FLUSH 100 UNIT/ML IV SOLN
500.0000 [IU] | Freq: Once | INTRAVENOUS | Status: DC | PRN
  Filled 2014-03-26: qty 5

## 2014-03-26 MED ORDER — SODIUM CHLORIDE 0.9 % IV SOLN
375.0000 mg/m2 | Freq: Once | INTRAVENOUS | Status: AC
  Administered 2014-03-26: 1000 mg via INTRAVENOUS
  Filled 2014-03-26: qty 100

## 2014-03-26 MED ORDER — DEXAMETHASONE SODIUM PHOSPHATE 10 MG/ML IJ SOLN: 10.0000 mg | Freq: Once | INTRAMUSCULAR | Status: DC

## 2014-03-26 MED ORDER — DIPHENHYDRAMINE HCL 25 MG PO CAPS
50.0000 mg | ORAL_CAPSULE | Freq: Once | ORAL | Status: AC
  Administered 2014-03-26: 50 mg via ORAL

## 2014-03-26 NOTE — Patient Instructions (Signed)
[facility] Discharge Instructions for Patients Receiving Chemotherapy  Today you received the following chemotherapy agents Rituxan and Bendamustine. To help prevent nausea and vomiting after your treatment, we encourage you to take your nausea medication as instructed. If you develop nausea and vomiting that is not controlled by your nausea medication, call the clinic. If it is after clinic hours your family physician or the after hours number for the clinic or go to the Emergency Department.   BELOW ARE SYMPTOMS THAT SHOULD BE REPORTED IMMEDIATELY:  *FEVER GREATER THAN 101.0 F  *CHILLS WITH OR WITHOUT FEVER  NAUSEA AND VOMITING THAT IS NOT CONTROLLED WITH YOUR NAUSEA MEDICATION  *UNUSUAL SHORTNESS OF BREATH  *UNUSUAL BRUISING OR BLEEDING  TENDERNESS IN MOUTH AND THROAT WITH OR WITHOUT PRESENCE OF ULCERS  *URINARY PROBLEMS  *BOWEL PROBLEMS  UNUSUAL RASH Items with * indicate a potential emergency and should be followed up as soon as possible.  Return to clinic tomorrow for Day 2. MD plans are to repeat PET scan after cycle 3. Report any issues/concerns to clinic as needed.  I have been informed and understand all the instructions given to me. I know to contact the clinic, my physician, or go to the Emergency Department if any problems should occur. I do not have any questions at this time, but understand that I may call the clinic during office hours or the Patient Navigator at [phone] should I have any questions or need assistance in obtaining follow up care.    __________________________________________  _____________  __________ Signature of Patient or Authorized Representative            Date                   Time    __________________________________________ Nurse's Signature

## 2014-03-26 NOTE — Progress Notes (Signed)
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 Mantle cell lymphoma    CURRENT THERAPY: Bendamustine Rituxan with Neulasta support started 02/26/2014  INTERVAL HISTORY: [name] [name] 73 y.o. male returns for followup after initiation of chemotherapy for mantle cell lymphoma diagnosed by inguinal lymph node biopsy 01/30/2014 with first cycle of bendamustine and Rituxan with Neulasta support given on 02/26/2014.  He has been more fatigued. Gen. body aches which are treated by oxycodone through his family physician. He is sleeping better. He's had headache for about a week after chemotherapy and has had regular bowel movements with no melena, hematochezia, hematuria, sore throat, or dysphagia. He also denies any urinary hesitancy, incontinence, or worsening lower extremity swelling or redness.  MEDICAL HISTORY: Past Medical History  Diagnosis Date  . Hypertension   . Hyperlipidemia   . Rhinitis   . Peripheral edema   . Osteoarthritis   . Obesity     morbid  . CAD (coronary artery disease)     s/p angioplasty  . ED (erectile dysfunction)   . OSA (obstructive sleep apnea)     severe, on CPAP 12  . Restless leg syndrome   . Insomnia   . Anxiety   . Bilateral lower extremity edema   . 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    ALLERGIES:  has No Known Allergies.  MEDICATIONS: has a current medication list which  includes the following prescription(s): acetaminophen, acyclovir, allopurinol, celecoxib, clonazepam, diphenhydramine, flurazepam, gabapentin, lidocaine-prilocaine, multivitamin with minerals, oxycodone-acetaminophen, prochlorperazine, ropinirole, temazepam, tizanidine, and loratadine-pseudoephedrine.  SURGICAL HISTORY:  Past Surgical History  Procedure Laterality Date  . Total knee arthroplasty      left  . Hemorrhoid surgery    . Back surgery  10/2009  . Eye surgery      lazer bil  . Coronary angioplasty    . Total knee arthroplasty  08/04/2012    right knee  . Total knee arthroplasty  08/04/2012    Procedure: TOTAL KNEE ARTHROPLASTY;  Surgeon: Yvette Rack., MD;  Location: [facility];  Service: Orthopedics;  Laterality: Right;  WITH PATELLA RESURFACING  . Lymph node biopsy Right 01/2014    groin area    FAMILY HISTORY: family history is negative for Coronary artery disease.  SOCIAL HISTORY:  reports that he has been smoking Cigarettes.  He has a 55 pack-year smoking history. He has never used smokeless tobacco. He reports that he does not drink alcohol or use illicit drugs.  REVIEW OF SYSTEMS:  Other than that discussed above is noncontributory.  PHYSICAL EXAMINATION: ECOG PERFORMANCE STATUS: 1 - Symptomatic but completely ambulatory  There were no vitals taken for this visit.  GENERAL:alert, no distress and comfortable SKIN: skin color, texture, turgor are normal, no rashes or significant lesions EYES: PERLA; Conjunctiva are pink and non-injected, sclera clear SINUSES: No redness or tenderness over maxillary or ethmoid sinuses OROPHARYNX:no exudate, no erythema on lips, buccal mucosa, or tongue. NECK: supple, thyroid normal size,  non-tender, without nodularity. No masses CHEST:  Increased AP diameter with light port in place. No gynecomastia. LYMPH:  no palpable lymphadenopathy in the cervical, axillary or inguinal LUNGS: clear to auscultation and percussion with normal breathing  effort HEART: regular rate & rhythm and no murmurs. ABDOMEN:abdomen soft, non-tender and normal bowel sounds MUSCULOSKELETAL:no cyanosis of digits and no clubbing. Range of motion normal. Lower 70 varicosities right greater than left with negative Homans sign. NEURO: alert & oriented x 3 with fluent speech, no focal motor/sensory deficits   LABORATORY DATA: Appointment on 03/26/2014  Component Date Value Ref Range Status  . WBC 03/26/2014 4.3  4.0 - 10.5 K/uL Final  . RBC 03/26/2014 4.36  4.22 - 5.81 MIL/uL Final  . Hemoglobin 03/26/2014 14.0  13.0 - 17.0 g/dL Final  . HCT 03/26/2014 40.3  39.0 - 52.0 % Final  . MCV 03/26/2014 92.4  78.0 - 100.0 fL Final  . MCH 03/26/2014 32.1  26.0 - 34.0 pg Final  . MCHC 03/26/2014 34.7  30.0 - 36.0 g/dL Final  . RDW 03/26/2014 16.5* 11.5 - 15.5 % Final  . Platelets 03/26/2014 154  150 - 400 K/uL Final  . Neutrophils Relative % 03/26/2014 74  43 - 77 % Final  . Neutro Abs 03/26/2014 3.1  1.7 - 7.7 K/uL Final  . Lymphocytes Relative 03/26/2014 9* 12 - 46 % Final  . Lymphs Abs 03/26/2014 0.4* 0.7 - 4.0 K/uL Final  . Monocytes Relative 03/26/2014 12  3 - 12 % Final  . Monocytes Absolute 03/26/2014 0.5  0.1 - 1.0 K/uL Final  . Eosinophils Relative 03/26/2014 4  0 - 5 % Final  . Eosinophils Absolute 03/26/2014 0.2  0.0 - 0.7 K/uL Final  . Basophils Relative 03/26/2014 1  0 - 1 % Final  . Basophils Absolute 03/26/2014 0.0  0.0 - 0.1 K/uL Final  Lab on 03/05/2014  Component Date Value Ref Range Status  . WBC 03/05/2014 22.7* 4.0 - 10.5 K/uL Final  . RBC 03/05/2014 5.01  4.22 - 5.81 MIL/uL Final  . Hemoglobin 03/05/2014 15.2  13.0 - 17.0 g/dL Final  . HCT 03/05/2014 45.0  39.0 - 52.0 % Final  . MCV 03/05/2014 89.8  78.0 - 100.0 fL Final  . MCH 03/05/2014 30.3  26.0 - 34.0 pg Final  . MCHC 03/05/2014 33.8  30.0 - 36.0 g/dL Final  . RDW 03/05/2014 15.8* 11.5 - 15.5 % Final  . Platelets 03/05/2014 143* 150 - 400 K/uL Final  . Neutrophils Relative %  03/05/2014 92* 43 - 77 % Final  . Neutro Abs 03/05/2014 21.0* 1.7 - 7.7 K/uL Final  . Lymphocytes Relative 03/05/2014 5* 12 - 46 % Final  . Lymphs Abs 03/05/2014 1.2  0.7 - 4.0 K/uL Final  . Monocytes Relative 03/05/2014 2* 3 - 12 % Final  . Monocytes Absolute 03/05/2014 0.4  0.1 - 1.0 K/uL Final  . Eosinophils Relative 03/05/2014 0  0 - 5 % Final  . Eosinophils Absolute 03/05/2014 0.1  0.0 - 0.7 K/uL Final  . Basophils Relative 03/05/2014 0  0 - 1 % Final  . Basophils Absolute 03/05/2014 0.1  0.0 - 0.1 K/uL Final  . WBC Morphology 03/05/2014 INCREASED BANDS (>20% BANDS)   Final  Infusion on 02/26/2014  Component Date Value Ref Range Status  . WBC 02/26/2014 4.3  4.0 - 10.5 K/uL Final  . RBC 02/26/2014 4.57  4.22 - 5.81 MIL/uL Final  . Hemoglobin 02/26/2014 13.9  13.0 - 17.0 g/dL  Final  . HCT 02/26/2014 40.7  39.0 - 52.0 % Final  . MCV 02/26/2014 89.1  78.0 - 100.0 fL Final  . MCH 02/26/2014 30.4  26.0 - 34.0 pg Final  . MCHC 02/26/2014 34.2  30.0 - 36.0 g/dL Final  . RDW 02/26/2014 14.7  11.5 - 15.5 % Final  . Platelets 02/26/2014 124* 150 - 400 K/uL Final  . Neutrophils Relative % 02/26/2014 71  43 - 77 % Final  . Neutro Abs 02/26/2014 3.1  1.7 - 7.7 K/uL Final  . Lymphocytes Relative 02/26/2014 20  12 - 46 % Final  . Lymphs Abs 02/26/2014 0.9  0.7 - 4.0 K/uL Final  . Monocytes Relative 02/26/2014 7  3 - 12 % Final  . Monocytes Absolute 02/26/2014 0.3  0.1 - 1.0 K/uL Final  . Eosinophils Relative 02/26/2014 1  0 - 5 % Final  . Eosinophils Absolute 02/26/2014 0.1  0.0 - 0.7 K/uL Final  . Basophils Relative 02/26/2014 1  0 - 1 % Final  . Basophils Absolute 02/26/2014 0.0  0.0 - 0.1 K/uL Final  . Sodium 02/26/2014 140  137 - 147 mEq/L Final  . Potassium 02/26/2014 4.3  3.7 - 5.3 mEq/L Final  . Chloride 02/26/2014 103  96 - 112 mEq/L Final  . CO2 02/26/2014 26  19 - 32 mEq/L Final  . Glucose, Bld 02/26/2014 120* 70 - 99 mg/dL Final  . BUN 02/26/2014 19  6 - 23 mg/dL Final  .  Creatinine, Ser 02/26/2014 0.81  0.50 - 1.35 mg/dL Final  . Calcium 02/26/2014 8.7  8.4 - 10.5 mg/dL Final  . Total Protein 02/26/2014 6.8  6.0 - 8.3 g/dL Final  . Albumin 02/26/2014 3.6  3.5 - 5.2 g/dL Final  . AST 02/26/2014 10  0 - 37 U/L Final  . ALT 02/26/2014 8  0 - 53 U/L Final  . Alkaline Phosphatase 02/26/2014 86  39 - 117 U/L Final  . Total Bilirubin 02/26/2014 0.4  0.3 - 1.2 mg/dL Final  . GFR calc non Af Amer 02/26/2014 87* >90 mL/min Final  . GFR calc Af Amer 02/26/2014 >90  >90 mL/min Final   Comment: (NOTE)                          The eGFR has been calculated using the CKD EPI equation.                          This calculation has not been validated in all clinical situations.                          eGFR's persistently <90 mL/min signify possible Chronic Kidney                          Disease.  Marland Kitchen Uric Acid, Serum 02/26/2014 3.6* 4.0 - 7.8 mg/dL Final    PATHOLOGY: Mantle cell lymphoma.  Urinalysis    Component Value Date/Time   COLORURINE AMBER* 07/28/2012 1230   APPEARANCEUR CLEAR 07/28/2012 1230   LABSPEC 1.032* 07/28/2012 1230   PHURINE 5.5 07/28/2012 1230   GLUCOSEU NEGATIVE 07/28/2012 1230   HGBUR NEGATIVE 07/28/2012 1230   BILIRUBINUR SMALL* 07/28/2012 1230   KETONESUR 15* 07/28/2012 1230   PROTEINUR NEGATIVE 07/28/2012 1230   UROBILINOGEN 1.0 07/28/2012 1230   NITRITE NEGATIVE 07/28/2012 1230   LEUKOCYTESUR TRACE*  07/28/2012 1230    RADIOGRAPHIC STUDIES: No results found.  ASSESSMENT:  #1. Mantle cell lymphoma, at least stage III, for cycle 2 of chemotherapy today #2. Chronic obstructive pulmonary disease.  #3. Degenerative joint disease lumbar spine.  #4. Coronary artery disease without evidence of heart failure or dysrhythmia.  #5. Lower extremity varicosities.  #6. Obstructive sleep apnea syndrome.  #7. Restless leg syndrome.  #8. Insomnia with obstructive sleep apnea, on CPAP, improved with flurazepam.  #9. Allergic rhinitis, controlled.       PLAN:  #1. Bendamustine/Rituxan cycle 2 to begin today with Neulasta support on 03/28/2014. #2. Lorazepam 30 mg at bedtime for sleep.  #3. Oxycodone/APAP for bone pain. #4. Followup in 3 weeks to receive cycle #3 of treatment. After 3 cycles, PET scan will be done to assess response. If a complete response is found, 2 additional cycles of treatment will be given followed by Rituxan maintenance.   All questions were answered. The patient knows to call the clinic with any problems, questions or concerns. We can certainly see the patient much sooner if necessary.   I spent 25 minutes counseling the patient face to face. The total time spent in the appointment was 30 minutes.    Doroteo Bradford, MD 03/26/2014 9:12 AM  DISCLAIMER:  This note was dictated with voice recognition software.  Similar sounding words can inadvertently be transcribed inaccurately and may not be corrected upon review.

## 2014-03-26 NOTE — Progress Notes (Signed)
STAR Program Physical Impairment and Functional Assessment Screening Tool  1. Are you having any pain, including headaches, joint pain, or muscle pain (upper body = OT; lower body = PT)?  Yes, this started after my diagnosis and is still a problem.  2. Do your hands and/or feet feel numb or tingle (PT)?  No  3. Does any part of your body feel swollen or larger than usual (upper body = OT; lower body = PT)?  Yes, but I hand this before my cancer diagnosis.  4. Are you so tired that you cannot do the things you want or need to do (PT or OT)?  Yes, this started after my diagnosis and is still a problem.  5. Are you feeling weak or are you having trouble moving any part of your body (PT/OT)?  Yes, this started after my diagnosis and is still a problem.  6. Are you having trouble concentrating, thinking, or remembering things (OT/ST)?  Yes, this started after my diagnosis and is still a problem.  7. Are you having trouble moving around or feel like you might trip or fall (PT)?  No  8. Are you having trouble swallowing (ST)?  No  9. Are you having trouble speaking (ST)?  No  10. Are you having trouble with going or getting to the bathroom (OT)?  No  11. Are you having trouble with your sexual function (OT)?  Yes, this started after my diagnosis and is still a problem.  12. Are you having trouble lifting things, even just your arms (OT/PT)?  Yes, this started after my diagnosis and is still a problem.  70. Are you having trouble taking care of yourself as in dressing or bathing (OT)?  No  14. Are you having trouble with daily tasks like chores or shopping (OT)?  No  15. Are you having trouble driving (OT)?  No  16. Are you having trouble returning to work or completing your tasks at work (OT)?  Yes, this started after my diagnosis and is still a problem.  Other concerns:    Legend: OT = Occupational Therapy PT = Physical Therapy ST = Speech  Therapy

## 2014-03-27 ENCOUNTER — Encounter (HOSPITAL_BASED_OUTPATIENT_CLINIC_OR_DEPARTMENT_OTHER): Payer: Medicare Other

## 2014-03-27 VITALS — BP 135/76 | HR 73 | Temp 98.5°F | Resp 20

## 2014-03-27 DIAGNOSIS — C8318 Mantle cell lymphoma, lymph nodes of multiple sites: Secondary | ICD-10-CM

## 2014-03-27 DIAGNOSIS — Z5111 Encounter for antineoplastic chemotherapy: Secondary | ICD-10-CM

## 2014-03-27 MED ORDER — DEXAMETHASONE SODIUM PHOSPHATE 10 MG/ML IJ SOLN: 10.0000 mg | Freq: Once | INTRAMUSCULAR | Status: DC

## 2014-03-27 MED ORDER — SODIUM CHLORIDE 0.9 % IV SOLN
Freq: Once | INTRAVENOUS | Status: AC
  Administered 2014-03-27: 09:00:00 via INTRAVENOUS

## 2014-03-27 MED ORDER — HEPARIN SOD (PORK) LOCK FLUSH 100 UNIT/ML IV SOLN
500.0000 [IU] | Freq: Once | INTRAVENOUS | Status: AC | PRN
  Administered 2014-03-27: 500 [IU]

## 2014-03-27 MED ORDER — SODIUM CHLORIDE 0.9 % IV SOLN: 16.0000 mg | Freq: Once | INTRAVENOUS | Status: DC

## 2014-03-27 MED ORDER — SODIUM CHLORIDE 0.9 % IV SOLN
Freq: Once | INTRAVENOUS | Status: AC
  Administered 2014-03-27: 16 mg via INTRAVENOUS
  Filled 2014-03-27: qty 8

## 2014-03-27 MED ORDER — RASBURICASE 1.5 MG IV SOLR
6.0000 mg | Freq: Once | INTRAVENOUS | Status: AC
  Administered 2014-03-27: 6 mg via INTRAVENOUS
  Filled 2014-03-27: qty 4

## 2014-03-27 MED ORDER — HEPARIN SOD (PORK) LOCK FLUSH 100 UNIT/ML IV SOLN
INTRAVENOUS | Status: AC
  Filled 2014-03-27: qty 5

## 2014-03-27 MED ORDER — SODIUM CHLORIDE 0.9 % IV SOLN
70.0000 mg/m2 | Freq: Once | INTRAVENOUS | Status: AC
  Administered 2014-03-27: 180 mg via INTRAVENOUS
  Filled 2014-03-27: qty 2

## 2014-03-27 MED ORDER — SODIUM CHLORIDE 0.9 % IJ SOLN
10.0000 mL | INTRAMUSCULAR | Status: DC | PRN
  Administered 2014-03-27: 10 mL

## 2014-03-27 NOTE — Patient Instructions (Signed)
[facility] Discharge Instructions for Patients Receiving Chemotherapy  Today you received the following chemotherapy agents bendamustine day 2 cycle 2. To help prevent nausea and vomiting after your treatment, we encourage you to take your nausea medication as instructed. You may start taking Claritin and Aleve for the next few days to help with the bone pain associated with the Neulasta injection. Return to clinic tomorrow as scheduled for Neulasta injection. Report any issues/concerns to clinic as needed prior to next appointment.  If you develop nausea and vomiting that is not controlled by your nausea medication, call the clinic. If it is after clinic hours your family physician or the after hours number for the clinic or go to the Emergency Department.   BELOW ARE SYMPTOMS THAT SHOULD BE REPORTED IMMEDIATELY:  *FEVER GREATER THAN 101.0 F  *CHILLS WITH OR WITHOUT FEVER  NAUSEA AND VOMITING THAT IS NOT CONTROLLED WITH YOUR NAUSEA MEDICATION  *UNUSUAL SHORTNESS OF BREATH  *UNUSUAL BRUISING OR BLEEDING  TENDERNESS IN MOUTH AND THROAT WITH OR WITHOUT PRESENCE OF ULCERS  *URINARY PROBLEMS  *BOWEL PROBLEMS  UNUSUAL RASH Items with * indicate a potential emergency and should be followed up as soon as possible.  I have been informed and understand all the instructions given to me. I know to contact the clinic, my physician, or go to the Emergency Department if any problems should occur. I do not have any questions at this time, but understand that I may call the clinic during office hours or the Patient Navigator at [phone] should I have any questions or need assistance in obtaining follow up care.    __________________________________________  _____________  __________ Signature of Patient or Authorized Representative            Date                   Time    __________________________________________ Nurse's Signature

## 2014-03-27 NOTE — Progress Notes (Signed)
Tolerated well

## 2014-03-28 ENCOUNTER — Encounter (HOSPITAL_BASED_OUTPATIENT_CLINIC_OR_DEPARTMENT_OTHER): Payer: Medicare Other

## 2014-03-28 VITALS — BP 146/82 | HR 82 | Temp 97.7°F | Resp 22

## 2014-03-28 DIAGNOSIS — Z5189 Encounter for other specified aftercare: Secondary | ICD-10-CM

## 2014-03-28 DIAGNOSIS — C8318 Mantle cell lymphoma, lymph nodes of multiple sites: Secondary | ICD-10-CM

## 2014-03-28 MED ORDER — PEGFILGRASTIM INJECTION 6 MG/0.6ML
6.0000 mg | Freq: Once | SUBCUTANEOUS | Status: AC
  Administered 2014-03-28: 6 mg via SUBCUTANEOUS

## 2014-03-28 MED ORDER — PEGFILGRASTIM INJECTION 6 MG/0.6ML
SUBCUTANEOUS | Status: AC
  Filled 2014-03-28: qty 0.6

## 2014-03-28 NOTE — Progress Notes (Signed)
[name] [name] presents today for injection per MD orders. Neulasta 6mg  administered SQ in left Abdomen. Administration without incident. Patient tolerated well. Denies any complaints post chemo.

## 2014-03-30 LAB — PULMONARY FUNCTION TEST
DL/VA % PRED: 103 %
DL/VA: 4.72 ml/min/mmHg/L
DLCO UNC % PRED: 69 %
DLCO unc: 21.5 ml/min/mmHg
FEF 25-75 POST: 1.12 L/s
FEF 25-75 Pre: 1.51 L/sec
FEF2575-%CHANGE-POST: -26 %
FEF2575-%PRED-POST: 47 %
FEF2575-%PRED-PRE: 64 %
FEV1-%CHANGE-POST: -3 %
FEV1-%PRED-POST: 68 %
FEV1-%Pred-Pre: 70 %
FEV1-POST: 2.12 L
FEV1-Pre: 2.19 L
FEV1FVC-%CHANGE-POST: 7 %
FEV1FVC-%Pred-Pre: 97 %
FEV6-%CHANGE-POST: -9 %
FEV6-%PRED-PRE: 75 %
FEV6-%Pred-Post: 68 %
FEV6-PRE: 3.01 L
FEV6-Post: 2.73 L
FEV6FVC-%Change-Post: 0 %
FEV6FVC-%PRED-POST: 105 %
FEV6FVC-%PRED-PRE: 104 %
FVC-%Change-Post: -9 %
FVC-%PRED-POST: 65 %
FVC-%Pred-Pre: 72 %
FVC-PRE: 3.07 L
FVC-Post: 2.76 L
POST FEV6/FVC RATIO: 99 %
PRE FEV6/FVC RATIO: 98 %
Post FEV1/FVC ratio: 77 %
Pre FEV1/FVC ratio: 71 %
RV % pred: 116 %
RV: 2.8 L
TLC % pred: 89 %
TLC: 6.08 L

## 2014-04-09 ENCOUNTER — Telehealth (HOSPITAL_COMMUNITY): Payer: Self-pay | Admitting: *Deleted

## 2014-04-09 NOTE — Telephone Encounter (Signed)
..  [name] [name] called complaining of severe fatigue. He wanted to know if he could drink the "5 Hr energy" drinks to give him some energy. I advised him against this. He states he just has no energy, although he is on a cpap machine he says that he is sleeping thru the night and does not feel that there is a problem with the mask or machine. He would like for [place] to call him if Dr. Barnet Glasgow can advise him of anything that will help.

## 2014-04-16 ENCOUNTER — Other Ambulatory Visit (HOSPITAL_COMMUNITY): Payer: Self-pay | Admitting: Oncology

## 2014-04-16 DIAGNOSIS — C8318 Mantle cell lymphoma, lymph nodes of multiple sites: Secondary | ICD-10-CM

## 2014-04-16 MED ORDER — ALLOPURINOL 300 MG PO TABS: 300.0000 mg | ORAL_TABLET | Freq: Every day | ORAL | Status: DC

## 2014-04-21 NOTE — Progress Notes (Addendum)
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 male returns for  regular  visit for followup of mantle cell lymphoma diagnosed by inguinal lymph node biopsy 01/30/2014 with first cycle of bendamustine and Rituxan with Neulasta support given on 02/26/2014.     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 There is also splenomegaly. The combination of findings is highly suspicious for lymphoma   01/29/2014 Initial Diagnosis Lymph node, biopsy, Right Groin- LN - MANTLE CELL LYMPHOMA.   02/06/2014 Cancer Staging Interpretation Peripheral Blood Flow Cytometry - A SMALL MONOCLONAL B-CELL POPULATION IDENTIFIED.   02/26/2014 -  Chemotherapy Bendamustine/Rituxan   I personally reviewed and went over laboratory results with the patient.  The results are noted within this dictation.  We will administer 3 cycles of chemotherapy and then pursue a restaging PET scan to evaluate response to therapy.  He is agreeable to this plan.  The nurse noted a right LE edema which we questioned the patient about.  He reports that it is minimally tender to ambulate and has been present for 6-9 months.  He has been under active treatment with [place] for 3 months.  He denies any tenderness to palpation.  We will verify that he does not have a DVT.  We spent a lot of time talking about classic cars given his and my passion for old collector cars, which we each have.  He provided me tons of information and tips  about local services that I can take advantage of.  Hematologically, he denies any complaints and ROS questioning is negative.  Past Medical History  Diagnosis Date  . Hypertension   . Hyperlipidemia   . Rhinitis   . Peripheral edema   . Osteoarthritis   . Obesity     morbid  . CAD (coronary artery disease)     s/p angioplasty  . ED (erectile dysfunction)   . OSA (obstructive sleep apnea)     severe, on CPAP 12  . Restless leg syndrome   . Insomnia   . Anxiety   . Bilateral lower extremity edema   . 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     has No Known Allergies.  Mr. Hawker does not currently have medications on file.  Past Surgical History  Procedure Laterality Date  . Total knee arthroplasty      left  . Hemorrhoid surgery    . Back surgery  10/2009  . Eye surgery      lazer bil  . Coronary angioplasty    . Total knee arthroplasty  08/04/2012    right knee  . Total knee arthroplasty  08/04/2012    Procedure: TOTAL KNEE ARTHROPLASTY;  Surgeon: Patricia Nettle  Valeta Harms., MD;  Location: [facility];  Service: Orthopedics;  Laterality: Right;  WITH PATELLA RESURFACING  . Lymph node biopsy Right 01/2014    groin area    Denies any headaches, dizziness, double vision, fevers, chills, night sweats, nausea, vomiting, diarrhea, constipation, chest pain, heart palpitations, shortness of breath, blood in stool, black tarry stool, urinary pain, urinary burning, urinary frequency, hematuria.   PHYSICAL EXAMINATION  ECOG PERFORMANCE STATUS: 1 - Symptomatic but completely ambulatory  There were no vitals filed for this visit.  GENERAL:alert, no distress,  well nourished, well developed, comfortable, cooperative, obese, smiling and in chemo-chair starting pre-med for chemotherapy. SKIN: skin color, texture, turgor are normal, no rashes or significant lesions HEAD: Normocephalic, No masses, lesions, tenderness or abnormalities EYES: normal, PERRLA, EOMI, Conjunctiva are pink and non-injected EARS: External ears normal OROPHARYNX:lips, buccal mucosa, and tongue normal and mucous membranes are moist  NECK: supple, trachea midline LYMPH:  not examined BREAST:not examined LUNGS: not examined HEART: not examined ABDOMEN:abdomen soft, obese and normal bowel sounds BACK: Back symmetric, no curvature. EXTREMITIES:less then 2 second capillary refill, no joint deformities, effusion, or inflammation, no skin discoloration, no cyanosis.  B/L LE edema, R > L with some erythema in lower half of leg.  Varicosities noted.  No pain on palpation.  Negative Homan's sign.  No popliteal discomfort.  No excess heat. NEURO: alert & oriented x 3 with fluent speech, no focal motor/sensory deficits, gait normal   LABORATORY DATA: CBC    Component Value Date/Time   WBC 4.1 04/23/2014 0844   RBC 4.41 04/23/2014 0844   RBC 5.00 02/06/2014 1530   HGB 14.4 04/23/2014 0844   HCT 41.0 04/23/2014 0844   PLT 160 04/23/2014 0844   MCV 93.0 04/23/2014 0844   MCH 32.7 04/23/2014 0844   MCHC 35.1 04/23/2014 0844   RDW 15.7* 04/23/2014 0844   LYMPHSABS 0.5* 04/23/2014 0844   MONOABS 0.3 04/23/2014 0844   EOSABS 0.2 04/23/2014 0844   BASOSABS 0.0 04/23/2014 0844      Chemistry      Component Value Date/Time   NA 137 04/23/2014 0844   K 4.1 04/23/2014 0844   CL 100 04/23/2014 0844   CO2 25 04/23/2014 0844   BUN 18 04/23/2014 0844   CREATININE 0.83 04/23/2014 0844      Component Value Date/Time   CALCIUM 8.9 04/23/2014 0844   ALKPHOS 73 04/23/2014 0844   AST 14 04/23/2014 0844   ALT 13 04/23/2014 0844   BILITOT 0.4 04/23/2014 0844       ASSESSMENT:  1. Mantle cell lymphoma, at  least stage III, for cycle 2 of chemotherapy today  2. Chronic obstructive pulmonary disease.  3. Degenerative joint disease lumbar spine.  4. Coronary artery disease without evidence of heart failure or dysrhythmia.  5. Lower extremity varicosities.  6. Obstructive sleep apnea syndrome.  7. Restless leg syndrome.  8. Insomnia with obstructive sleep apnea, on CPAP, improved with flurazepam.  9. Allergic rhinitis, controlled.  Patient Active Problem List   Diagnosis Date Noted  . Allergic rhinitis 03/05/2014  . 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 Chronic bronchitis 08/01/2013  . Varicose veins of lower extremities with other complications 46/96/2952  . Lower extremity edema 04/23/2013  . Osteoarthritis   . CAD (coronary artery disease)   . Anxiety   . CAD, NATIVE VESSEL 10/07/2009  . RESTLESS LEG SYNDROME 04/24/2009  . INSOMNIA 04/24/2009  . TOBACCO ABUSE 02/29/2008  .  OSA (obstructive sleep apnea) 02/29/2008    PLAN:  1. I personally reviewed and went over laboratory results with the patient.  The results are noted within this dictation. 2. Pre-chemo labs as planned 3. Cycle 3 of Bendamustine/Rituxan as planned 4. PET scan in 3 weeks to evaluate response to therapy. 5. Right LE [place] to evaluate for DVT 6. Return in 3-4 weeks for follow-up   THERAPY PLAN:  After 3 cycles, PET scan will be done to assess response. If a complete response is found, 2 additional cycles of treatment will be given followed by Rituxan maintenance.   All questions were answered. The patient knows to call the clinic with any problems, questions or concerns. We can certainly see the patient much sooner if necessary.  Patient and plan discussed with Dr. Farrel Gobble and he is in agreement with the aforementioned.   KEFALAS,THOMAS 04/23/2014

## 2014-04-22 ENCOUNTER — Other Ambulatory Visit (HOSPITAL_COMMUNITY): Payer: Self-pay | Admitting: Hematology and Oncology

## 2014-04-23 ENCOUNTER — Encounter (HOSPITAL_BASED_OUTPATIENT_CLINIC_OR_DEPARTMENT_OTHER): Payer: Medicare Other | Admitting: Oncology

## 2014-04-23 ENCOUNTER — Encounter (HOSPITAL_COMMUNITY): Payer: Medicare Other | Attending: Hematology and Oncology

## 2014-04-23 ENCOUNTER — Encounter (HOSPITAL_COMMUNITY): Payer: Self-pay

## 2014-04-23 ENCOUNTER — Other Ambulatory Visit (HOSPITAL_COMMUNITY): Payer: Self-pay | Admitting: Hematology and Oncology

## 2014-04-23 VITALS — BP 148/82 | HR 90 | Temp 98.5°F | Resp 20 | Wt 307.0 lb

## 2014-04-23 DIAGNOSIS — M7989 Other specified soft tissue disorders: Secondary | ICD-10-CM

## 2014-04-23 DIAGNOSIS — C8318 Mantle cell lymphoma, lymph nodes of multiple sites: Secondary | ICD-10-CM

## 2014-04-23 DIAGNOSIS — Z5111 Encounter for antineoplastic chemotherapy: Secondary | ICD-10-CM

## 2014-04-23 DIAGNOSIS — Z5112 Encounter for antineoplastic immunotherapy: Secondary | ICD-10-CM

## 2014-04-23 LAB — COMPREHENSIVE METABOLIC PANEL
ALBUMIN: 3.7 g/dL (ref 3.5–5.2)
ALT: 13 U/L (ref 0–53)
AST: 14 U/L (ref 0–37)
Alkaline Phosphatase: 73 U/L (ref 39–117)
Anion gap: 12 (ref 5–15)
BUN: 18 mg/dL (ref 6–23)
CHLORIDE: 100 meq/L (ref 96–112)
CO2: 25 mEq/L (ref 19–32)
CREATININE: 0.83 mg/dL (ref 0.50–1.35)
Calcium: 8.9 mg/dL (ref 8.4–10.5)
GFR calc Af Amer: >90 mL/min (ref >90)
GFR calc non Af Amer: 86 mL/min — ABNORMAL LOW (ref >90)
Glucose, Bld: 112 mg/dL — ABNORMAL HIGH (ref 70–99)
Potassium: 4.1 mEq/L (ref 3.7–5.3)
SODIUM: 137 meq/L (ref 137–147)
Total Bilirubin: 0.4 mg/dL (ref 0.3–1.2)
Total Protein: 6.8 g/dL (ref 6.0–8.3)

## 2014-04-23 LAB — CBC WITH DIFFERENTIAL/PLATELET
BASOS PCT: 1 % (ref 0–1)
Basophils Absolute: 0 10*3/uL (ref 0.0–0.1)
Eosinophils Absolute: 0.2 10*3/uL (ref 0.0–0.7)
Eosinophils Relative: 4 % (ref 0–5)
HEMATOCRIT: 41 % (ref 39.0–52.0)
Hemoglobin: 14.4 g/dL (ref 13.0–17.0)
LYMPHS PCT: 13 % (ref 12–46)
Lymphs Abs: 0.5 10*3/uL — ABNORMAL LOW (ref 0.7–4.0)
MCH: 32.7 pg (ref 26.0–34.0)
MCHC: 35.1 g/dL (ref 30.0–36.0)
MCV: 93 fL (ref 78.0–100.0)
MONO ABS: 0.3 10*3/uL (ref 0.1–1.0)
Monocytes Relative: 6 % (ref 3–12)
NEUTROS ABS: 3.1 10*3/uL (ref 1.7–7.7)
NEUTROS PCT: 76 % (ref 43–77)
PLATELETS: 160 10*3/uL (ref 150–400)
RBC: 4.41 MIL/uL (ref 4.22–5.81)
RDW: 15.7 % — AB (ref 11.5–15.5)
WBC: 4.1 10*3/uL (ref 4.0–10.5)

## 2014-04-23 LAB — URIC ACID: URIC ACID, SERUM: 4 mg/dL (ref 4.0–7.8)

## 2014-04-23 MED ORDER — HEPARIN SOD (PORK) LOCK FLUSH 100 UNIT/ML IV SOLN
INTRAVENOUS | Status: AC
  Filled 2014-04-23: qty 5

## 2014-04-23 MED ORDER — SODIUM CHLORIDE 0.9 % IV SOLN
6.0000 mg | Freq: Once | INTRAVENOUS | Status: AC
  Administered 2014-04-23: 6 mg via INTRAVENOUS
  Filled 2014-04-23: qty 4

## 2014-04-23 MED ORDER — DIPHENHYDRAMINE HCL 25 MG PO CAPS
50.0000 mg | ORAL_CAPSULE | Freq: Once | ORAL | Status: AC
  Administered 2014-04-23: 50 mg via ORAL

## 2014-04-23 MED ORDER — SODIUM CHLORIDE 0.9 % IV SOLN
375.0000 mg/m2 | Freq: Once | INTRAVENOUS | Status: AC
  Administered 2014-04-23: 1000 mg via INTRAVENOUS
  Filled 2014-04-23: qty 100

## 2014-04-23 MED ORDER — SODIUM CHLORIDE 0.9 % IV SOLN
70.0000 mg/m2 | Freq: Once | INTRAVENOUS | Status: AC
  Administered 2014-04-23: 180 mg via INTRAVENOUS
  Filled 2014-04-23: qty 2

## 2014-04-23 MED ORDER — DIPHENHYDRAMINE HCL 25 MG PO CAPS
ORAL_CAPSULE | ORAL | Status: AC
  Filled 2014-04-23: qty 2

## 2014-04-23 MED ORDER — SODIUM CHLORIDE 0.9 % IV SOLN
Freq: Once | INTRAVENOUS | Status: AC
  Administered 2014-04-23: 16 mg via INTRAVENOUS
  Filled 2014-04-23: qty 8

## 2014-04-23 MED ORDER — ACETAMINOPHEN 325 MG PO TABS
ORAL_TABLET | ORAL | Status: AC
Start: 2014-04-23 — End: 2014-04-23
  Filled 2014-04-23: qty 2

## 2014-04-23 MED ORDER — FLURAZEPAM HCL 30 MG PO CAPS: ORAL_CAPSULE | ORAL | Status: DC

## 2014-04-23 MED ORDER — HEPARIN SOD (PORK) LOCK FLUSH 100 UNIT/ML IV SOLN
500.0000 [IU] | Freq: Once | INTRAVENOUS | Status: AC | PRN
  Administered 2014-04-23: 500 [IU]

## 2014-04-23 MED ORDER — SODIUM CHLORIDE 0.9 % IJ SOLN
10.0000 mL | INTRAMUSCULAR | Status: DC | PRN
  Administered 2014-04-23: 10 mL

## 2014-04-23 MED ORDER — ACETAMINOPHEN 325 MG PO TABS
650.0000 mg | ORAL_TABLET | Freq: Once | ORAL | Status: AC
  Administered 2014-04-23: 650 mg via ORAL

## 2014-04-23 MED ORDER — SODIUM CHLORIDE 0.9 % IV SOLN
Freq: Once | INTRAVENOUS | Status: AC
  Administered 2014-04-23: 09:00:00 via INTRAVENOUS

## 2014-04-23 NOTE — Patient Instructions (Signed)
[facility] Discharge Instructions for Patients Receiving Chemotherapy  Today you received the following chemotherapy agents Cycle 3 Day 1 Rituxan and Bendamustine. Return to clinic tomorrow for Day 2 Bendamustine and on Thursday Day 3 for Neulasta injection. We will schedule PET scan in next couple of weeks. If scans reveal complete response then we will plan for maintenance chemotherapy. If scans reveal decrease disease but not a complete response then we will plan for additional cycles of the same chemotherapy. To help prevent nausea and vomiting after your treatment, we encourage you to take your nausea medication as instructed. If you develop nausea and vomiting that is not controlled by your nausea medication, call the clinic. If it is after clinic hours your family physician or the after hours number for the clinic or go to the Emergency Department.   BELOW ARE SYMPTOMS THAT SHOULD BE REPORTED IMMEDIATELY:  *FEVER GREATER THAN 101.0 F  *CHILLS WITH OR WITHOUT FEVER  NAUSEA AND VOMITING THAT IS NOT CONTROLLED WITH YOUR NAUSEA MEDICATION  *UNUSUAL SHORTNESS OF BREATH  *UNUSUAL BRUISING OR BLEEDING  TENDERNESS IN MOUTH AND THROAT WITH OR WITHOUT PRESENCE OF ULCERS  *URINARY PROBLEMS  *BOWEL PROBLEMS  UNUSUAL RASH Items with * indicate a potential emergency and should be followed up as soon as possible.  I have been informed and understand all the instructions given to me. I know to contact the clinic, my physician, or go to the Emergency Department if any problems should occur. I do not have any questions at this time, but understand that I may call the clinic during office hours or the Patient Navigator at [phone] should I have any questions or need assistance in obtaining follow up care.    __________________________________________  _____________  __________ Signature of Patient or Authorized Representative            Date                    Time    __________________________________________ Nurse's Signature

## 2014-04-23 NOTE — Addendum Note (Signed)
Addended by: Baird Cancer on: 04/23/2014 03:28 PM   Modules accepted: Level of Service

## 2014-04-23 NOTE — Patient Instructions (Signed)
[facility] Discharge Instructions  RECOMMENDATIONS MADE BY THE CONSULTANT AND ANY TEST RESULTS WILL BE SENT TO YOUR REFERRING PHYSICIAN.  EXAM FINDINGS BY THE PHYSICIAN TODAY AND SIGNS OR SYMPTOMS TO REPORT TO CLINIC OR PRIMARY PHYSICIAN: Exam and findings as discussed by [name] Pane, PA-C.  Will check your response to therapy with PET scan in a couple of weeks.  Nothing to eat or drink 6 hours prior to the scan.  No candy, sugar or chewing gum prior to the PET scan.    INSTRUCTIONS/FOLLOW-UP: Chemotherapy as scheduled. PET scan in 2 - 3 weeks and office visit afterwards.  Thank you for choosing [facility] to provide your oncology and hematology care.  To afford each patient quality time with our providers, please arrive at least 15 minutes before your scheduled appointment time.  With your help, our goal is to use those 15 minutes to complete the necessary work-up to ensure our physicians have the information they need to help with your evaluation and healthcare recommendations.    Effective January 1st, 2014, we ask that you re-schedule your appointment with our physicians should you arrive 10 or more minutes late for your appointment.  We strive to give you quality time with our providers, and arriving late affects you and other patients whose appointments are after yours.    Again, thank you for choosing [facility].  Our hope is that these requests will decrease the amount of time that you wait before being seen by our physicians.       _____________________________________________________________  Should you have questions after your visit to [facility], please contact our office at [phone] between the hours of 8:30 a.m. and 4:30 p.m.  Voicemails left after 4:30 p.m. will not be returned until the following business day.  For prescription refill requests, have your pharmacy contact our office with your prescription  refill request.    _______________________________________________________________  We hope that we have given you very good care.  You may receive a patient satisfaction survey in the mail, please complete it and return it as soon as possible.  We value your feedback!  _______________________________________________________________  Have you asked about our STAR program?  STAR stands for Survivorship Training and Rehabilitation, and this is a nationally recognized cancer care program that focuses on survivorship and rehabilitation.  Cancer and cancer treatments may cause problems, such as, pain, making you feel tired and keeping you from doing the things that you need or want to do. Cancer rehabilitation can help. Our goal is to reduce these troubling effects and help you have the best quality of life possible.  You may receive a survey from a nurse that asks questions about your current state of health.  Based on the survey results, all eligible patients will be referred to the [facility] program for an evaluation so we can better serve you!  A frequently asked questions sheet is available upon request. Positron Emission Tomography (PET Scan) PET stands for positron emission tomography. This is a test similar to an X-ray. Pictures can be taken of a body part after injection of a very small dose of a chemical called a radionuclide. This is combined with sugar, water, or ammonia to give off tiny particles called positrons. The positrons emitted are like small bursts of energy that can be detected by a scanner. They are processed by a computer to create images. These images can be used to study different diseases.  They are often used to study cancer and cancer therapy. A scan of the entire body can be done and used to study all its parts. Because this test is tagged to a sugar used by cells, the bursts of energy show up differently in cells that use sugar faster. The computer is able to produce a color-coded  picture based on this. The colors and amount of brightness on a PET image show different levels of tissue or organ function. For example, a cancer grows faster than healthy tissue and uses more sugar than normal tissue. It will absorb more of the substance injected. This causes it to appear brighter than normal tissue on the PET image. A specialist will read and explain the images. Other examinations, such as recent CT (or CAT) scans or MRI scans may help with interpretation and should be brought along. There are usually no restrictions after the test. You should drink plenty of fluids to flush the radioactive substance from your body. BEFORE THE PROCEDURE   PET is usually an outpatient procedure. Wear comfortable, loose-fitting clothes.  Do not eat for four hours before the scan. You will be encouraged to drink water.  Your caregiver will instruct you regarding the use of medications before the test.  Note: Diabetic patients should ask for any specific diet guidelines to control glucose (sugar) levels during the day of the test. There are limitations with the test if your blood sugar is not controlled during or before the test.  Be on time because of the rapid decay of the radioactive material that must be injected. PROCEDURE  Before the procedure begins a small amount of harmless radioactive material will be injected into a vein. This means you will have a needle stick. It will take from 30 minutes to one hour for the material to travel around your body in preparation for the scan. You will lie on a cushioned table and be moved through the center of a machine that looks like a large doughnut. This is the machine that detects the positrons. It is connected to a computer that produces images that can be viewed on a monitor. This will take about 30 minutes to an hour, during which you must remain still. Let your caregiver know if this will be difficult for you. Also, let your caregiver know if you need  a sedative or help dealing with claustrophobia (feeling uncomfortable in enclosed spaces). HOME CARE INSTRUCTIONS   For the protection of your privacy, test results can not be given over the phone. Make sure you receive the results of your test. Ask as to how these results are to be obtained if you have not been informed. It is your responsibility to obtain your test results.  Drink several 8-once glasses of water following the test to flush the small amount of radioactive material out of your body.  Keep your follow-up appointments. Document Released: 02/20/2003 Document Revised: 11/08/2011 Document Reviewed: 11/28/2013 [facility] Patient Information 2015 [place], [place]. This information is not intended to replace advice given to you by your health care provider. Make sure you discuss any questions you have with your health care provider.

## 2014-04-23 NOTE — Progress Notes (Signed)
Tolerated well

## 2014-04-23 NOTE — Addendum Note (Signed)
Addended by: Baird Cancer on: 04/23/2014 03:28 PM   Modules accepted: Orders

## 2014-04-24 ENCOUNTER — Encounter (HOSPITAL_BASED_OUTPATIENT_CLINIC_OR_DEPARTMENT_OTHER): Payer: Medicare Other

## 2014-04-24 ENCOUNTER — Ambulatory Visit (HOSPITAL_COMMUNITY)
Admission: RE | Admit: 2014-04-24 | Discharge: 2014-04-24 | Disposition: A | Discharge status: Home / Self Care | Payer: Medicare Other | Source: Ambulatory Visit | Attending: Oncology | Admitting: Oncology

## 2014-04-24 ENCOUNTER — Encounter: Payer: Self-pay | Admitting: *Deleted

## 2014-04-24 ENCOUNTER — Other Ambulatory Visit (HOSPITAL_COMMUNITY): Payer: Self-pay | Admitting: Hematology and Oncology

## 2014-04-24 VITALS — BP 134/72 | HR 70 | Temp 98.5°F | Resp 20 | Wt 307.4 lb

## 2014-04-24 DIAGNOSIS — M7989 Other specified soft tissue disorders: Secondary | ICD-10-CM | POA: Diagnosis not present

## 2014-04-24 DIAGNOSIS — Z5111 Encounter for antineoplastic chemotherapy: Secondary | ICD-10-CM

## 2014-04-24 DIAGNOSIS — C8318 Mantle cell lymphoma, lymph nodes of multiple sites: Secondary | ICD-10-CM

## 2014-04-24 MED ORDER — HEPARIN SOD (PORK) LOCK FLUSH 100 UNIT/ML IV SOLN
INTRAVENOUS | Status: AC
  Filled 2014-04-24: qty 5

## 2014-04-24 MED ORDER — SODIUM CHLORIDE 0.9 % IV SOLN
70.0000 mg/m2 | Freq: Once | INTRAVENOUS | Status: AC
  Administered 2014-04-24: 180 mg via INTRAVENOUS
  Filled 2014-04-24: qty 2

## 2014-04-24 MED ORDER — HEPARIN SOD (PORK) LOCK FLUSH 100 UNIT/ML IV SOLN
500.0000 [IU] | Freq: Once | INTRAVENOUS | Status: AC | PRN
  Administered 2014-04-24: 500 [IU]

## 2014-04-24 MED ORDER — SODIUM CHLORIDE 0.9 % IV SOLN
Freq: Once | INTRAVENOUS | Status: AC
  Administered 2014-04-24: 16 mg via INTRAVENOUS
  Filled 2014-04-24: qty 8

## 2014-04-24 MED ORDER — SODIUM CHLORIDE 0.9 % IJ SOLN
10.0000 mL | INTRAMUSCULAR | Status: DC | PRN
  Administered 2014-04-24: 10 mL

## 2014-04-24 MED ORDER — SODIUM CHLORIDE 0.9 % IV SOLN
6.0000 mg | Freq: Once | INTRAVENOUS | Status: AC
  Administered 2014-04-24: 6 mg via INTRAVENOUS
  Filled 2014-04-24: qty 4

## 2014-04-24 MED ORDER — SODIUM CHLORIDE 0.9 % IV SOLN
Freq: Once | INTRAVENOUS | Status: AC
  Administered 2014-04-24: 09:00:00 via INTRAVENOUS

## 2014-04-24 NOTE — Progress Notes (Signed)
[facility] Clinical Social Work  Clinical Social Work met briefly with pt in the infusion room to check in and introduce self.   Clinical Social Worker explained role of CSW and assistance available. Pt denied current concerns, but agrees to reach out to CSW if future needs arise.   Clinical Social Work interventions: Education about role of [place], Programme researcher, broadcasting/film/video

## 2014-04-24 NOTE — Progress Notes (Signed)
Tolerated well

## 2014-04-25 ENCOUNTER — Ambulatory Visit (HOSPITAL_COMMUNITY)
Admission: RE | Admit: 2014-04-25 | Discharge: 2014-04-25 | Disposition: A | Discharge status: Home / Self Care | Payer: Medicare Other | Source: Ambulatory Visit | Attending: Hematology and Oncology | Admitting: Hematology and Oncology

## 2014-04-25 ENCOUNTER — Ambulatory Visit (HOSPITAL_COMMUNITY): Payer: Medicare Other

## 2014-04-25 ENCOUNTER — Telehealth (HOSPITAL_COMMUNITY): Payer: Self-pay | Admitting: Emergency Medicine

## 2014-04-25 DIAGNOSIS — R6889 Other general symptoms and signs: Secondary | ICD-10-CM | POA: Insufficient documentation

## 2014-04-25 DIAGNOSIS — IMO0001 Reserved for inherently not codable concepts without codable children: Secondary | ICD-10-CM | POA: Insufficient documentation

## 2014-04-25 DIAGNOSIS — Z7409 Other reduced mobility: Secondary | ICD-10-CM | POA: Insufficient documentation

## 2014-04-25 MED ORDER — PEGFILGRASTIM INJECTION 6 MG/0.6ML
SUBCUTANEOUS | Status: AC
  Filled 2014-04-25: qty 0.6

## 2014-04-25 NOTE — Progress Notes (Signed)
Speech Language Pathology Screen Patient Details  Name: [name] [name] MRN: [id number] Date of Birth: 1940-12-19  Today's Date: 04/25/2014 Time: 3:00 PM  - 3:15 PM    HPI: Mr. [name] is a 73 yo STAR referral from Dr. Barnet Glasgow. Pt was screened by SLP and found to have no changes in swallow, language, or cognitive linguistic function. No further SLP services indicated at this time. PT was recommended, however pt states that he thinks he can do exercises on his own at home as he worries about gas money to get to therapy. He was assured that we could schedule his appointments to coincide with his other appointments in [address] if that helps and he stated that he will think about it and call back if he wants to schedule more appointments with PT.   Pain Assessment Currently in Pain?: Yes Pain Score: 5  Pain Location: Leg Pain Orientation: Right;Left Pain Type: Other (Comment) (post chemotherapy) Pain Onset: More than a month ago Pain Frequency: Constant Pain Relieving Factors: sleeping, laying down Effect of Pain on Daily Activities: decreased endurance, difficulty with prolonged walking, quick to fatigue with walking,  Thank you,  Genene Churn, [place]     [name] 04/25/2014, 3:21 PM

## 2014-04-25 NOTE — Evaluation (Signed)
Occupational Therapy Evaluation  Patient Details  Name: [name] [name] MRN: [id number] Date of Birth: 08-Apr-1941  Today's Date: 04/25/2014 Time: 1350-1420 OT Time Calculation (min): 30 min OT eval 1350-1405 15' Therex 4403-4742 15'  Visit#: 1 of 1  Re-eval:    Assessment Diagnosis: Lymphoma Prior Therapy: None  Authorization: Medicare  Authorization Time Period:    Authorization Visit#:   of     Past Medical History:  Past Medical History  Diagnosis Date  . Hypertension   . Hyperlipidemia   . Rhinitis   . Peripheral edema   . Osteoarthritis   . Obesity     morbid  . CAD (coronary artery disease)     s/p angioplasty  . ED (erectile dysfunction)   . OSA (obstructive sleep apnea)     severe, on CPAP 12  . Restless leg syndrome   . Insomnia   . Anxiety   . Bilateral lower extremity edema   . Tobacco abuse    Past Surgical History:  Past Surgical History  Procedure Laterality Date  . Total knee arthroplasty      left  . Hemorrhoid surgery    . Back surgery  10/2009  . Eye surgery      lazer bil  . Coronary angioplasty    . Total knee arthroplasty  08/04/2012    right knee  . Total knee arthroplasty  08/04/2012    Procedure: TOTAL KNEE ARTHROPLASTY;  Surgeon: Yvette Rack., MD;  Location: [facility];  Service: Orthopedics;  Laterality: Right;  WITH PATELLA RESURFACING  . Lymph node biopsy Right 01/2014    groin area    Subjective Symptoms/Limitations Symptoms: S: I can't walk as far. I get tired quicker. Pertinent History: [name] presents to OT with Mantle Cell Lymphoma which was dianosed on 01/29/14 with a lymph node biopsy. Pt started chemotherapy 02/26/14 and is presently receiving rounds. Pt reports decreased endurance, BUE and BLE weakness. Dr. Barnet Glasgow has referred patient to  occupational therapy for evaluation and treatment.  Special Tests: Timed Up and Go (TUG): 13.75" Visual Analog Scale (VAS): Fatigue: 5.6 Pain: 4.6 Distress: 2.3 FACIT-F Total  Score:117  Patient Stated Goals: To not get tired quickly. Pain Assessment Currently in Pain?: Yes Pain Score: 5  Pain Location: Leg Pain Orientation: Right;Left Pain Type: Other (Comment) (post chemotherapy) Pain Onset: More than a month ago Pain Frequency: Constant Pain Relieving Factors: sleeping, laying down Effect of Pain on Daily Activities: decreased endurance, difficulty with prolonged walking, quick to fatigue with walking,  Precautions/Restrictions  Precautions Precautions: None  Balance Screening Balance Screen Has the patient fallen in the past 6 months: No  Prior [place] expects to be discharged to:: Private residence Living Arrangements: Children (older daughter) Type of Home: House Home Access: Stairs to enter [employer] of Steps: 2-3 Prior Function Level of Independence: Independent with basic ADLs;Independent with gait  Able to Take Stairs?: Yes Driving: Yes Vocation: Full time employment 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 He becomes tired quickly. Dominant Hand: Right Vision - History Baseline Vision: No visual deficits  Cognition/Observation Cognition Overall Cognitive Status: Within Functional Limits for tasks assessed Arousal/Alertness: Awake/alert Orientation Level: Oriented X4   Additional Assessments RUE AROM (degrees) RUE Overall AROM Comments: AROM WFL in all ranges RUE Strength Right Shoulder Flexion: 5/5 Right Shoulder ABduction: 5/5  Right Shoulder Internal Rotation: 4/5 Right Shoulder External Rotation: 5/5 Grip (lbs): 77 Lateral Pinch: 16 lbs 3 Point Pinch: 16 lbs LUE AROM (degrees) LUE Overall AROM Comments: AROM WFL in all ranges LUE Strength Left Shoulder Flexion: 5/5 Left Shoulder ABduction: 5/5 Left Shoulder  Internal Rotation: 4/5 Left Shoulder External Rotation: 5/5 Grip (lbs): 75 Lateral Pinch: 15 lbs 3 Point Pinch: 16 lbs   Exercise/Treatments Seated Protraction: Theraband;5 reps;Both Theraband Level (Shoulder Protraction): Level 4 (Blue) Horizontal ABduction: Theraband;5 reps;Both Theraband Level (Shoulder Horizontal ABduction): Level 4 (Blue) External Rotation: Theraband;5 reps;Both Theraband Level (Shoulder External Rotation): Level 4 (Blue) Internal Rotation: Theraband;5 reps;Both Theraband Level (Shoulder Internal Rotation): Level 4 (Blue) Flexion: Theraband;5 reps;Both Theraband Level (Shoulder Flexion): Level 4 (Blue) Other Seated Exercises: PNF patterns; blue theraband; 1 set; 5X; both       Occupational Therapy Assessment and Plan OT Assessment and Plan Clinical Impression Statement: A: patient is a 73 y/o male diagnosed with lymphoma presenting to OT with complaints of decreased endurance and BUE and BLE weakness resulting in difficulty completing work and leisure tasks.  Pt will benefit from skilled therapeutic intervention in order to improve on the following deficits: Abnormal gait;Decreased activity tolerance;Decreased strength;Difficulty walking;Pain;Impaired flexibility;Decreased range of motion;Increased fascial restricitons;Decreased mobility Rehab Potential: Excellent OT Frequency: Min 1X/week OT Duration:  (1 week) OT Treatment/Interventions: Therapeutic exercise;Patient/family education OT Plan: P: 1 time eval only. Due to patient driving back and forth from [facility] patient was evaluated and given a HEP to work on UE strength and endurance at home independently. Pt agreed with recommendation.    Goals Short Term Goals Time to Complete Short Term Goals:  (1 week) Short Term Goal 1: Patient will be educated on HEP. Short Term Goal 1 Progress: Met  Problem List Patient Active Problem List   Diagnosis Date Noted  . Decreased mobility and endurance  04/25/2014  . Allergic rhinitis 03/05/2014  . 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 Chronic bronchitis 08/01/2013  . Varicose veins of lower extremities with other complications 86/57/8469  . Lower extremity edema 04/23/2013  . Osteoarthritis   . CAD (coronary artery disease)   . Anxiety   . CAD, NATIVE VESSEL 10/07/2009  . RESTLESS LEG SYNDROME 04/24/2009  . INSOMNIA 04/24/2009  . TOBACCO ABUSE 02/29/2008  . OSA (obstructive sleep apnea) 02/29/2008    End of Session Activity Tolerance: Patient tolerated treatment well General Behavior During Therapy: WFL for tasks assessed/performed OT Plan of Care OT Home Exercise Plan: Blue theraband exercises OT Patient Instructions: handout (scanned) Consulted and Agree with Plan of Care: Patient  GO Functional Assessment Tool Used: Clinical judgement Functional Limitation: Carrying, moving and handling objects Carrying, Moving and Handling Objects Current Status (G2952): 0 percent impaired, limited or restricted Carrying, Moving and Handling Objects Goal Status (W4132): 0 percent impaired, limited or restricted Carrying, Moving and Handling Objects Discharge Status [id number]): 0 percent impaired, limited or restricted  Ailene Ravel, OTR/L,CBIS   04/25/2014, 4:03 PM  Physician Documentation Your signature is required to indicate approval of the treatment plan as stated above.  Please sign and either send electronically or make a copy of this report for your files and return this physician signed original.  Please mark one 1.__approve of plan  2. ___approve of plan with the following conditions.   ______________________________  _____________________ Physician Signature                                                                                                             Date

## 2014-04-25 NOTE — Telephone Encounter (Signed)
Telephone encounter.

## 2014-04-25 NOTE — Evaluation (Addendum)
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 Hypertension   . Hyperlipidemia   . Rhinitis   . Peripheral edema   . Osteoarthritis   . Obesity     morbid  . CAD (coronary artery disease)     s/p angioplasty  . ED (erectile dysfunction)   . OSA (obstructive sleep apnea)     severe, on CPAP 12  . Restless leg syndrome   . Insomnia   . Anxiety   . Bilateral lower extremity edema   . Tobacco abuse    Past Surgical History:  Past Surgical History  Procedure Laterality Date  . Total knee arthroplasty      left  . Hemorrhoid surgery    . Back surgery  10/2009  . Eye surgery      lazer bil  . Coronary angioplasty    . Total knee arthroplasty  08/04/2012    right knee  . Total knee arthroplasty  08/04/2012    Procedure: TOTAL KNEE ARTHROPLASTY;  Surgeon: Yvette Rack., MD;  Location: [facility];  Service: Orthopedics;  Laterality: Right;  WITH PATELLA RESURFACING  . Lymph node biopsy Right 01/2014    groin area    Subjective Symptoms/Limitations Symptoms: "I feel weak" patient walks, but not far secondary to swelling in legs and fatigue following treatments. I think my pain is due to my swelling, no numbness and tingling. No head aches since first chemo bout.  Pertinent History: Patient is in 3rd cycle of chemo and will be undergoing testign soon to make sure it is working, History of back pain, patient went in for MRI for low back pain that found a ruptured disc and cancer 4 months ago. Lt TKA, Rt TKA, Cervical spine fusion, "trimming of"  thoracic spine disc, and  future surgery for ruptured disc." Special Tests: Timed Up and Go (TUG): 13.75" Visual Analog Scale (VAS): FACIT:  Patient Stated Goals: decrease pain and be able to walk further Pain Assessment Currently in Pain?: Yes Pain Score: 5  Pain Location: Leg Pain Orientation: Right;Left Pain Type: Other (Comment) (post chemotherapy) Pain Onset: More than a month ago Pain Frequency: Constant Pain Relieving Factors: sleeping, laying down Effect of Pain on Daily Activities: decreased endurance, difficulty with prolonged walking, quick to fatigue with walking,  Precautions/Restrictions  Precautions Precautions: None  Balance Screening Balance Screen Has the patient fallen in the past 6 months: No  Prior [place] expects to be discharged to:: Private residence Living Arrangements: Children (older daughter) Type of Home: House Home Access: Stairs to enter [employer] of Steps: 2-3 Prior Function Level of Independence: Independent with basic ADLs;Independent with gait  Able to Take Stairs?: Yes Driving: Yes Vocation: Full time employment 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  Cognition/Observation Cognition Overall Cognitive Status: Within Functional Limits for tasks assessed Arousal/Alertness: Awake/alert Orientation Level: Oriented X4 Observation/Other Assessments Observations: Gait: Right sisded trendelenberg gait > Lt sided trendelenberg, excessive toe out,   Sensation/Coordination/Flexibility/Functional Tests Flexibility Thomas: Positive  Obers: Positive 90/90: Positive Functional Tests Functional Tests: 47min walk:  Functional Tests: TUG: 13.685 Functional Tests: Positive ely's test, +pirifomirs test  Assessment  FACIT-F total score 117/160 VAS -Fatigue 5.6 VAS - Pain 4.6 VAS - Distress 2.3  RUE AROM (degrees) RUE Overall AROM Comments: AROM WFL in all ranges RUE Strength Right Shoulder  Flexion: 5/5 Right Shoulder ABduction: 5/5 Right Shoulder Internal Rotation: 4/5 Right Shoulder External Rotation: 5/5 Grip (lbs): 77 Lateral Pinch: 16 lbs 3 Point Pinch: 16 lbs LUE AROM (degrees) LUE Overall AROM Comments: AROM WFL in all ranges LUE Strength Left Shoulder Flexion: 5/5 Left Shoulder ABduction: 5/5 Left Shoulder Internal Rotation: 4/5 Left Shoulder External Rotation: 5/5 Grip (lbs): 75 Lateral Pinch: 15 lbs 3 Point Pinch: 16 lbs RLE Strength Right Hip Flexion: 4/5 Right Hip ABduction: 3/5 Right Knee Flexion: 4/5 Right Knee Extension: 4/5 Right Ankle Dorsiflexion: 5/5 LLE Strength Left Hip Flexion: 4/5 Left Hip ABduction: 3/5 Left Knee Flexion: 4/5 Left Knee Extension: 4/5 Left Ankle Dorsiflexion: 5/5  Physical Therapy Assessment and Plan PT Assessment and Plan Clinical Impression Statement: Patient displays decreased activity tolerance and abnormal gait sedondary to weakness attributed to reatments for pelvic cancer. Patient will benefit from skilled phsyical therapy to address then above listed limitations and increase activity tolerance so patient can ambualte > 46minutes withtou needed to rest due to pain/fatigue. Patient states limited financial resources and wishes to attend therapy no more than once a month for education for performance of strengthening exercises to decrease pain. This session focused on educating patient in performance of a walking program and  stretchign exercises to maintain AROM as chemotherapy is known for resulting in lossses in AROM.  Pt will benefit from skilled therapeutic intervention in order to improve on the following deficits: Abnormal gait;Decreased activity tolerance;Decreased strength;Difficulty walking;Pain;Impaired flexibility;Decreased range of motion;Increased fascial restricitons;Decreased mobility Rehab Potential: Fair PT Frequency: Min 1X/week PT Duration: 12 weeks PT Treatment/Interventions: Buyer, retail;Therapeutic activities;Therapeutic exercise;Balance training;Modalities;Manual techniques;Patient/family education PT Plan: Advise patient on strengtheing exercises next session    Goals Home Exercise Program Pt/caregiver will Perform Home Exercise Program: For increased ROM PT Goal: Perform Home Exercise Program - Progress: Goal set today PT Short Term Goals Time to Complete Short Term Goals: 4 weeks PT Short Term Goal 1: Patient will be independent with HEP PT Short Term Goal 2: Patient will report increased walking endurance to > 10 minutes   Problem List Patient Active Problem List   Diagnosis Date Noted  . Decreased mobility and endurance 04/25/2014  . Allergic rhinitis 03/05/2014  . 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 Chronic bronchitis 08/01/2013  . Varicose veins of lower extremities with other complications 81/85/6314  . Lower extremity edema 04/23/2013  . Osteoarthritis   . CAD (coronary artery disease)   . Anxiety   . CAD, NATIVE VESSEL 10/07/2009  . RESTLESS LEG SYNDROME 04/24/2009  . INSOMNIA 04/24/2009  . TOBACCO ABUSE 02/29/2008  . OSA (obstructive sleep apnea) 02/29/2008    PT - End of Session Activity Tolerance: Patient tolerated treatment well General Behavior During Therapy: WFL for tasks assessed/performed PT Plan of Care PT Home Exercise Plan: LE stretches given including: pirifomris, hamstring, calf, hip flexor and groin. 3D hip excursions for strenghenign and mobility  GP Functional Assessment Tool Used: Clinical judgement Functional Limitation: Mobility: Walking and moving around Mobility: Walking and Moving Around Current Status [phone]): At least 40 percent but less than 60 percent impaired, limited or restricted  Mobility: Walking and Moving Around Goal Status [phone]): At least 20 percent but less than 40 percent impaired, limited or restricted  Leia Alf 04/25/2014, 3:58 PM  Physician  Documentation Your signature is required to indicate approval of the treatment plan as stated above.  Please sign and either send electronically or make a copy of this report for your files and return this physician signed original.   Please mark one 1.__approve of plan  2. ___approve of plan with the following conditions.   ______________________________                                                          _____________________ Physician Signature                                                                                                             Date

## 2014-05-05 ENCOUNTER — Other Ambulatory Visit: Payer: Self-pay | Admitting: Pulmonary Disease

## 2014-05-13 ENCOUNTER — Encounter (HOSPITAL_COMMUNITY): Payer: Self-pay

## 2014-05-13 ENCOUNTER — Encounter (HOSPITAL_COMMUNITY)
Admission: RE | Admit: 2014-05-13 | Discharge: 2014-05-13 | Disposition: A | Discharge status: Home / Self Care | Payer: Medicare Other | Source: Ambulatory Visit | Attending: Oncology | Admitting: Oncology

## 2014-05-13 DIAGNOSIS — C8318 Mantle cell lymphoma, lymph nodes of multiple sites: Secondary | ICD-10-CM | POA: Diagnosis present

## 2014-05-13 LAB — GLUCOSE, CAPILLARY: Glucose-Capillary: 100 mg/dL — ABNORMAL HIGH (ref 70–99)

## 2014-05-13 MED ORDER — FLUDEOXYGLUCOSE F - 18 (FDG) INJECTION
15.2900 | Freq: Once | INTRAVENOUS | Status: AC | PRN
  Administered 2014-05-13: 15.29 via INTRAVENOUS

## 2014-05-14 NOTE — Progress Notes (Signed)
This encounter was created in error - please disregard.

## 2014-05-16 ENCOUNTER — Ambulatory Visit (HOSPITAL_COMMUNITY): Payer: Medicare Other | Admitting: Oncology

## 2014-05-16 ENCOUNTER — Encounter (HOSPITAL_COMMUNITY): Payer: Medicare Other | Attending: Hematology and Oncology | Admitting: Oncology

## 2014-05-16 VITALS — BP 120/67 | HR 73 | Temp 98.0°F | Resp 18 | Wt 306.0 lb

## 2014-05-16 DIAGNOSIS — C8318 Mantle cell lymphoma, lymph nodes of multiple sites: Secondary | ICD-10-CM

## 2014-05-16 DIAGNOSIS — M7989 Other specified soft tissue disorders: Secondary | ICD-10-CM | POA: Insufficient documentation

## 2014-05-16 DIAGNOSIS — R350 Frequency of micturition: Secondary | ICD-10-CM

## 2014-05-16 DIAGNOSIS — G4700 Insomnia, unspecified: Secondary | ICD-10-CM

## 2014-05-16 MED ORDER — TEMAZEPAM 30 MG PO CAPS: 30.0000 mg | ORAL_CAPSULE | Freq: Every evening | ORAL | Status: DC | PRN

## 2014-05-16 MED ORDER — TAMSULOSIN HCL 0.4 MG PO CAPS: 0.4000 mg | ORAL_CAPSULE | Freq: Every day | ORAL | Status: DC

## 2014-05-16 NOTE — Patient Instructions (Signed)
[name] [name] Discharge Instructions  RECOMMENDATIONS MADE BY THE CONSULTANT AND ANY TEST RESULTS WILL BE SENT TO YOUR REFERRING PHYSICIAN.  EXAM FINDINGS BY THE PHYSICIAN TODAY AND SIGNS OR SYMPTOMS TO REPORT TO CLINIC OR PRIMARY PHYSICIAN: Exam and findings as discussed by [name], PA.  You will have Chemo infusion next week 9/22.  Your final chemo date will be 10/20 and you will see [name] that day.  You were given 2 new prescriptions today, Restoril for insomnia and Flomax for the urinary frequency.  Please take as prescribed.  Call office with any questions or concerns.    Thank you for choosing [facility] to provide your oncology and hematology care.  To afford each patient quality time with our providers, please arrive at least 15 minutes before your scheduled appointment time.  With your help, our goal is to use those 15 minutes to complete the necessary work-up to ensure our physicians have the information they need to help with your evaluation and healthcare recommendations.    Effective January 1st, 2014, we ask that you re-schedule your appointment with our physicians should you arrive 10 or more minutes late for your appointment.  We strive to give you quality time with our providers, and arriving late affects you and other patients whose appointments are after yours.    Again, thank you for choosing [facility].  Our hope is that these requests will decrease the amount of time that you wait before being seen by our physicians.       _____________________________________________________________  Should you have questions after your visit to [facility], please contact our office at [phone] between the hours of 8:30 a.m. and 4:30 p.m.  Voicemails left after 4:30 p.m. will not be returned until the following business day.  For prescription refill requests, have your pharmacy contact our office with your  prescription refill request.    _______________________________________________________________  We hope that we have given you very good care.  You may receive a patient satisfaction survey in the mail, please complete it and return it as soon as possible.  We value your feedback!  _______________________________________________________________  Have you asked about our STAR program?  STAR stands for Survivorship Training and Rehabilitation, and this is a nationally recognized cancer care program that focuses on survivorship and rehabilitation.  Cancer and cancer treatments may cause problems, such as, pain, making you feel tired and keeping you from doing the things that you need or want to do. Cancer rehabilitation can help. Our goal is to reduce these troubling effects and help you have the best quality of life possible.  You may receive a survey from a nurse that asks questions about your current state of health.  Based on the survey results, all eligible patients will be referred to the [facility] program for an evaluation so we can better serve you!  A frequently asked questions sheet is available upon request.

## 2014-05-16 NOTE — Progress Notes (Signed)
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 male returns for  regular  visit for followup of mantle cell lymphoma diagnosed by inguinal lymph node biopsy 01/30/2014 with first cycle of bendamustine and Rituxan with Neulasta support given on 02/26/2014.     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 There is also splenomegaly. The combination of findings is highly suspicious for lymphoma   01/29/2014 Initial Diagnosis Lymph node, biopsy, Right Groin- LN - MANTLE CELL LYMPHOMA.   02/06/2014 Cancer Staging Interpretation Peripheral Blood Flow Cytometry - A SMALL MONOCLONAL B-CELL POPULATION IDENTIFIED.   02/26/2014 -  Chemotherapy Bendamustine/Rituxan x 5 cycles with Neulasta support   05/13/2014 Remission PET- No evidence for residual or recurrent tumor within the neck, chest, abdomen, or pelvis.     I personally reviewed and went over laboratory results with the patient.  The results are noted within this dictation.  His PET scan the other day, 05/13/2014, demonstrates a complete remission.  With this restaging PET scan, we will administer 2 more cycles of bendamustine and Rituxan, followed by Rituxan maintenance will be given every 90 days for 2 years.  I discussed the risks, benefits, alternatives, and side effects of this maintenance therapy. He understands these are getting Rituxan as part of his induction chemotherapy regimen. This will be continued as a single agent in  a maintenance fashion.  He notes that he is having some difficulty with insomnia. He is on Restoril 15 mg capsules at bedtime. He reports these don't work too well but he does note that when he takes 2 of him at that time, he sleeps much better. As result, I given a prescription for Restoril 30 mg capsules and you take one at bedtime as needed for sleep.  He also notes urinary frequency without any burning, pain, and hematuria. He denies malodorous urine. He notes that he was given a medication by [name] in the past when he had similar symptoms which was very effective for him. I suspect this medication was Flomax and the patient reports that that sounds familiar. As result, I'll give him a prescription for Flomax with refills.  Hematologically, the patient denies any complaints and ROS questioning is negative.  The patient is extremely pleased with the results of his PET scan, as are we.   Past Medical History  Diagnosis Date  . Hypertension   . Hyperlipidemia   . Rhinitis   . Peripheral edema   . Osteoarthritis   . Obesity     morbid  . CAD (coronary artery disease)     s/p angioplasty  . ED (erectile dysfunction)   . OSA (obstructive sleep apnea)     severe, on CPAP 12  . Restless leg syndrome   . Insomnia   . Anxiety   . Bilateral lower extremity edema   . 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     has No Known Allergies.  [name] [name] had no medications administered during this visit.  Past Surgical History  Procedure Laterality Date  . Total knee arthroplasty      left  . Hemorrhoid surgery    .  Back surgery  10/2009  . Eye surgery      lazer bil  . Coronary angioplasty    . Total knee arthroplasty  08/04/2012    right knee  . Total knee arthroplasty  08/04/2012    Procedure: TOTAL KNEE ARTHROPLASTY;  Surgeon: Yvette Rack., MD;  Location: [place];  Service: Orthopedics;  Laterality: Right;  WITH PATELLA RESURFACING  . Lymph node biopsy Right 01/2014    groin area    Denies any headaches, dizziness, double vision, fevers, chills, night sweats, nausea, vomiting, diarrhea, constipation, chest pain, heart palpitations, shortness of breath, blood in stool, black tarry stool, urinary pain, urinary burning, urinary frequency, hematuria.   PHYSICAL EXAMINATION  ECOG PERFORMANCE STATUS: 1 - Symptomatic but completely ambulatory  Filed Vitals:   05/16/14 1023  BP: 120/67  Pulse: 73  Temp: 98 F (36.7 C)  Resp: 18    GENERAL:alert, no distress, well nourished, well developed, comfortable, cooperative, obese, smiling and chronically ill appearing SKIN: skin color, texture, turgor are normal, no rashes or significant lesions HEAD: Normocephalic, No masses, lesions, tenderness or abnormalities EYES: normal, PERRLA, EOMI, Conjunctiva are pink and non-injected EARS: External ears normal OROPHARYNX:mucous membranes are moist  NECK: supple, no adenopathy, thyroid normal size, non-tender, without nodularity, no stridor, non-tender, trachea midline LYMPH:  no palpable lymphadenopathy, no hepatosplenomegaly BREAST:not examined LUNGS: clear to auscultation and percussion HEART: regular rate & rhythm, no murmurs and no gallops ABDOMEN:abdomen soft, non-tender, obese, normal bowel sounds and no masses or organomegaly BACK: Back symmetric, no curvature. EXTREMITIES:less then 2 second capillary refill, no joint deformities, effusion, or inflammation, no skin discoloration, no clubbing, no cyanosis, positive findings:  edema bilateral 1-2+ pitting edema pre-tibially, in ankles, and feet.  NEURO:  alert & oriented x 3 with fluent speech, no focal motor/sensory deficits, gait normal    LABORATORY DATA: CBC    Component Value Date/Time   WBC 4.1 04/23/2014 0844   RBC 4.41 04/23/2014 0844   RBC 5.00 02/06/2014 1530   HGB 14.4 04/23/2014 0844   HCT 41.0 04/23/2014 0844   PLT 160 04/23/2014 0844   MCV 93.0 04/23/2014 0844   MCH 32.7 04/23/2014 0844   MCHC 35.1 04/23/2014 0844   RDW 15.7* 04/23/2014 0844   LYMPHSABS 0.5* 04/23/2014 0844   MONOABS 0.3 04/23/2014 0844   EOSABS 0.2 04/23/2014 0844   BASOSABS 0.0 04/23/2014 0844      Chemistry      Component Value Date/Time   NA 137 04/23/2014 0844   K 4.1 04/23/2014 0844   CL 100 04/23/2014 0844   CO2 25 04/23/2014 0844   BUN 18 04/23/2014 0844   CREATININE 0.83 04/23/2014 0844      Component Value Date/Time   CALCIUM 8.9 04/23/2014 0844   ALKPHOS 73 04/23/2014 0844   AST 14 04/23/2014 0844   ALT 13 04/23/2014 0844   BILITOT 0.4 04/23/2014 0844        RADIOGRAPHIC STUDIES:  05/13/2014  CLINICAL DATA: Subsequent treatment strategy for lymphoma.  EXAM:  NUCLEAR MEDICINE  PET SKULL BASE TO THIGH  TECHNIQUE:  15.29 mCi F-18 FDG was injected intravenously. Full-ring PET imaging  was performed from the skull base to thigh after the radiotracer. CT  data was obtained and used for attenuation correction and anatomic  localization.  FASTING BLOOD GLUCOSE: Value: 100 mg/dl  COMPARISON: None.  FINDINGS:  NECK  No hypermetabolic lymph nodes in the neck.  CHEST  There is no airspace consolidation or atelectasis. No suspicious  pulmonary nodules or mass is identified.  ABDOMEN/PELVIS  No abnormal hypermetabolic activity within the liver, pancreas,  adrenal glands, or spleen. Multiple stones are noted within the  gallbladder. No hypermetabolic lymph nodes in the abdomen or pelvis.  Calcified atherosclerotic disease involves the abdominal aorta. The  aorta measures 3.1 cm in AP dimension.  SKELETON  No focal hypermetabolic activity to  suggest skeletal metastasis.  IMPRESSION:  1. No evidence for residual or recurrent tumor within the neck chest  abdomen or pelvis.  2. Atherosclerotic disease. 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 Recommend follow up by [place] in 3years. This  recommendation follows ACR consensus guidelines: White Paper of the  ACR Incidental Findings Committee II on Vascular Findings. J Am Coll  [id number]; 85:[id number].  3. Gallstones.  Electronically Signed  By: Kerby Moors M.D.  On: 05/13/2014 13:22     ASSESSMENT:  1. Mantle cell lymphoma, at least stage III.  CR following 3 cycles of BR therapy. 2. Chronic obstructive pulmonary disease.  3. Degenerative joint disease lumbar spine.  4. Coronary artery disease without evidence of heart failure or dysrhythmia.  5. Lower extremity varicosities.  6. Obstructive sleep apnea syndrome.  7. Restless leg syndrome.  8. Insomnia with obstructive sleep apnea, on CPAP, improved with flurazepam.  9. Allergic rhinitis, controlled. 10. Insomnia 11. Urinary frequency  Patient Active Problem List   Diagnosis Date Noted  . Decreased mobility and endurance 04/25/2014  . Allergic rhinitis 03/05/2014  . 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 Chronic bronchitis 08/01/2013  . Varicose veins of lower extremities with other complications 50/04/3817  . Lower extremity edema 04/23/2013  . Osteoarthritis   . CAD (coronary artery disease)   . Anxiety   . CAD, NATIVE VESSEL 10/07/2009  . RESTLESS LEG SYNDROME 04/24/2009  . INSOMNIA 04/24/2009  . TOBACCO ABUSE 02/29/2008  . OSA (obstructive sleep apnea) 02/29/2008     PLAN:  1. I personally reviewed and went over laboratory results with the patient.  The results are noted within this dictation. 2. I personally reviewed and went over radiographic studies with the patient.  The results are noted within this dictation.   3. 2 more cycles of BR therapy, followed  by Rituxan maintenance every 90 days x 2 years. 4. Development of 2 more cycles of chemotherapy. 5. Risks, benefits, alternatives, and side effects of maintenance Rituxan discussed. 6. Cycle 4 of chemotherapy (BR) as planend starting on 05/21/2014. 7. Deletion of Rasburicase from treatment plan for cycles 4 and 5. 8. Rx for Restoril 30 mg at HS PRN insomnia 9. Rx for FloMax 0.4 mg daily.  10. Return in 10/20 for follow-up and development of Rituxan maintenance.   THERAPY PLAN:  His PET scan on 05/13/2014 demonstrates a complete remission/complete response following 3 cycles of bendamustine/Rituxan. As result, he will undergo 2 more cycles of bendamustine/Rituxan followed by Rituxan maintenance every 90 days for 2 years. Restaging PET scan may be appropriate following this cycle of bendamustine/Rituxan to  confirm complete remission prior to moving on to maintenance Rituxan.  All questions were answered. The patient knows to call the clinic with any problems, questions or concerns. We can certainly see the patient much sooner if necessary.  Patient and plan discussed with Dr. Farrel Gobble and he is in agreement with the aforementioned.   KEFALAS,THOMAS 05/16/2014

## 2014-05-18 ENCOUNTER — Other Ambulatory Visit: Payer: Self-pay | Admitting: Pulmonary Disease

## 2014-05-21 ENCOUNTER — Encounter (HOSPITAL_BASED_OUTPATIENT_CLINIC_OR_DEPARTMENT_OTHER): Payer: Medicare Other

## 2014-05-21 VITALS — BP 128/68 | HR 70 | Temp 98.3°F | Resp 20 | Wt 305.4 lb

## 2014-05-21 DIAGNOSIS — M7989 Other specified soft tissue disorders: Secondary | ICD-10-CM | POA: Diagnosis present

## 2014-05-21 DIAGNOSIS — C8318 Mantle cell lymphoma, lymph nodes of multiple sites: Secondary | ICD-10-CM | POA: Diagnosis not present

## 2014-05-21 DIAGNOSIS — Z5111 Encounter for antineoplastic chemotherapy: Secondary | ICD-10-CM

## 2014-05-21 DIAGNOSIS — Z5112 Encounter for antineoplastic immunotherapy: Secondary | ICD-10-CM

## 2014-05-21 LAB — CBC WITH DIFFERENTIAL/PLATELET
BASOS PCT: 1 % (ref 0–1)
Basophils Absolute: 0 10*3/uL (ref 0.0–0.1)
EOS ABS: 0.1 10*3/uL (ref 0.0–0.7)
Eosinophils Relative: 4 % (ref 0–5)
HEMATOCRIT: 41.2 % (ref 39.0–52.0)
HEMOGLOBIN: 14.7 g/dL (ref 13.0–17.0)
Lymphocytes Relative: 13 % (ref 12–46)
Lymphs Abs: 0.4 10*3/uL — ABNORMAL LOW (ref 0.7–4.0)
MCH: 33.1 pg (ref 26.0–34.0)
MCHC: 35.7 g/dL (ref 30.0–36.0)
MCV: 92.8 fL (ref 78.0–100.0)
MONO ABS: 0.2 10*3/uL (ref 0.1–1.0)
MONOS PCT: 7 % (ref 3–12)
Neutro Abs: 2.5 10*3/uL (ref 1.7–7.7)
Neutrophils Relative %: 75 % (ref 43–77)
Platelets: 145 10*3/uL — ABNORMAL LOW (ref 150–400)
RBC: 4.44 MIL/uL (ref 4.22–5.81)
RDW: 14.7 % (ref 11.5–15.5)
WBC: 3.3 10*3/uL — ABNORMAL LOW (ref 4.0–10.5)

## 2014-05-21 LAB — COMPREHENSIVE METABOLIC PANEL
ALBUMIN: 3.7 g/dL (ref 3.5–5.2)
ALT: 16 U/L (ref 0–53)
ANION GAP: 11 (ref 5–15)
AST: 18 U/L (ref 0–37)
Alkaline Phosphatase: 71 U/L (ref 39–117)
BUN: 15 mg/dL (ref 6–23)
CO2: 24 mEq/L (ref 19–32)
Calcium: 8.8 mg/dL (ref 8.4–10.5)
Chloride: 101 mEq/L (ref 96–112)
Creatinine, Ser: 0.87 mg/dL (ref 0.50–1.35)
GFR calc non Af Amer: 85 mL/min — ABNORMAL LOW (ref >90)
GLUCOSE: 110 mg/dL — AB (ref 70–99)
Potassium: 4.1 mEq/L (ref 3.7–5.3)
Sodium: 136 mEq/L — ABNORMAL LOW (ref 137–147)
TOTAL PROTEIN: 7.1 g/dL (ref 6.0–8.3)
Total Bilirubin: 0.5 mg/dL (ref 0.3–1.2)

## 2014-05-21 LAB — URIC ACID: URIC ACID, SERUM: 3.6 mg/dL — AB (ref 4.0–7.8)

## 2014-05-21 MED ORDER — DIPHENHYDRAMINE HCL 25 MG PO CAPS
ORAL_CAPSULE | ORAL | Status: AC
  Filled 2014-05-21: qty 2

## 2014-05-21 MED ORDER — DIPHENHYDRAMINE HCL 25 MG PO CAPS
50.0000 mg | ORAL_CAPSULE | Freq: Once | ORAL | Status: AC
  Administered 2014-05-21: 50 mg via ORAL

## 2014-05-21 MED ORDER — SODIUM CHLORIDE 0.9 % IJ SOLN
10.0000 mL | INTRAMUSCULAR | Status: DC | PRN
Start: 2014-05-21 — End: 2014-05-21
  Administered 2014-05-21: 10 mL

## 2014-05-21 MED ORDER — ACETAMINOPHEN 325 MG PO TABS
ORAL_TABLET | ORAL | Status: AC
  Filled 2014-05-21: qty 2

## 2014-05-21 MED ORDER — SODIUM CHLORIDE 0.9 % IV SOLN
70.0000 mg/m2 | Freq: Once | INTRAVENOUS | Status: AC
  Administered 2014-05-21: 180 mg via INTRAVENOUS
  Filled 2014-05-21: qty 2

## 2014-05-21 MED ORDER — SODIUM CHLORIDE 0.9 % IV SOLN
Freq: Once | INTRAVENOUS | Status: AC
  Administered 2014-05-21: 16 mg via INTRAVENOUS
  Filled 2014-05-21: qty 8

## 2014-05-21 MED ORDER — SODIUM CHLORIDE 0.9 % IV SOLN
Freq: Once | INTRAVENOUS | Status: AC
  Administered 2014-05-21: 09:00:00 via INTRAVENOUS

## 2014-05-21 MED ORDER — HEPARIN SOD (PORK) LOCK FLUSH 100 UNIT/ML IV SOLN
500.0000 [IU] | Freq: Once | INTRAVENOUS | Status: AC | PRN
  Administered 2014-05-21: 500 [IU]
  Filled 2014-05-21: qty 5

## 2014-05-21 MED ORDER — SODIUM CHLORIDE 0.9 % IV SOLN
375.0000 mg/m2 | Freq: Once | INTRAVENOUS | Status: AC
  Administered 2014-05-21: 1000 mg via INTRAVENOUS
  Filled 2014-05-21: qty 100

## 2014-05-21 MED ORDER — ACETAMINOPHEN 325 MG PO TABS
650.0000 mg | ORAL_TABLET | Freq: Once | ORAL | Status: AC
  Administered 2014-05-21: 650 mg via ORAL

## 2014-05-21 NOTE — Patient Instructions (Signed)
[facility] Discharge Instructions for Patients Receiving Chemotherapy  Today you received the following chemotherapy agents Day 1 Cycle 4 Rituxan and Bendamustine. Return to clinic tomorrow as scheduled for Day 2. To help prevent nausea and vomiting after your treatment, we encourage you to take your nausea medication as instructed.  If you develop nausea and vomiting that is not controlled by your nausea medication, call the clinic. If it is after clinic hours your family physician or the after hours number for the clinic or go to the Emergency Department.   BELOW ARE SYMPTOMS THAT SHOULD BE REPORTED IMMEDIATELY:  *FEVER GREATER THAN 101.0 F  *CHILLS WITH OR WITHOUT FEVER  NAUSEA AND VOMITING THAT IS NOT CONTROLLED WITH YOUR NAUSEA MEDICATION  *UNUSUAL SHORTNESS OF BREATH  *UNUSUAL BRUISING OR BLEEDING  TENDERNESS IN MOUTH AND THROAT WITH OR WITHOUT PRESENCE OF ULCERS  *URINARY PROBLEMS  *BOWEL PROBLEMS  UNUSUAL RASH Items with * indicate a potential emergency and should be followed up as soon as possible.  One of the nurses will contact you 24 hours after your treatment. Please let the nurse know about any problems that you may have experienced. Feel free to call the clinic you have any questions or concerns. The clinic phone number is [phone].   I have been informed and understand all the instructions given to me. I know to contact the clinic, my physician, or go to the Emergency Department if any problems should occur. I do not have any questions at this time, but understand that I may call the clinic during office hours or the Patient Navigator at [phone] should I have any questions or need assistance in obtaining follow up care.    __________________________________________  _____________  __________ Signature of Patient or Authorized Representative            Date                    Time    __________________________________________ Nurse's Signature

## 2014-05-21 NOTE — Progress Notes (Signed)
Tolerated chemotherapy well.

## 2014-05-22 ENCOUNTER — Encounter (HOSPITAL_BASED_OUTPATIENT_CLINIC_OR_DEPARTMENT_OTHER): Payer: Medicare Other

## 2014-05-22 ENCOUNTER — Encounter (HOSPITAL_COMMUNITY): Payer: Self-pay

## 2014-05-22 VITALS — BP 132/71 | HR 76 | Temp 98.3°F | Resp 20 | Wt 304.0 lb

## 2014-05-22 DIAGNOSIS — Z5111 Encounter for antineoplastic chemotherapy: Secondary | ICD-10-CM

## 2014-05-22 DIAGNOSIS — C8318 Mantle cell lymphoma, lymph nodes of multiple sites: Secondary | ICD-10-CM

## 2014-05-22 MED ORDER — SODIUM CHLORIDE 0.9 % IJ SOLN
10.0000 mL | INTRAMUSCULAR | Status: DC | PRN
  Administered 2014-05-22: 10 mL

## 2014-05-22 MED ORDER — SODIUM CHLORIDE 0.9 % IV SOLN
Freq: Once | INTRAVENOUS | Status: AC
  Administered 2014-05-22: 16 mg via INTRAVENOUS
  Filled 2014-05-22: qty 8

## 2014-05-22 MED ORDER — SODIUM CHLORIDE 0.9 % IV SOLN
70.0000 mg/m2 | Freq: Once | INTRAVENOUS | Status: AC
  Administered 2014-05-22: 180 mg via INTRAVENOUS
  Filled 2014-05-22: qty 2

## 2014-05-22 MED ORDER — HEPARIN SOD (PORK) LOCK FLUSH 100 UNIT/ML IV SOLN
500.0000 [IU] | Freq: Once | INTRAVENOUS | Status: AC | PRN
Start: 2014-05-22 — End: 2014-05-22
  Administered 2014-05-22: 500 [IU]
  Filled 2014-05-22: qty 5

## 2014-05-22 MED ORDER — SODIUM CHLORIDE 0.9 % IV SOLN
Freq: Once | INTRAVENOUS | Status: AC
  Administered 2014-05-22: 10:00:00 via INTRAVENOUS

## 2014-05-22 NOTE — Progress Notes (Signed)
Patient tolerated chemotherapy well.

## 2014-05-22 NOTE — Patient Instructions (Signed)
[facility] Discharge Instructions for Patients Receiving Chemotherapy  Today you received the following chemotherapy agents bendamustine  To help prevent nausea and vomiting after your treatment, we encourage you to take your nausea medication compazine every 6 hours as needed.    If you develop nausea and vomiting, or diarrhea that is not controlled by your medication, call the clinic.  The clinic phone number is [phone]. Office hours are Monday-Friday 8:30am-5:00pm.  BELOW ARE SYMPTOMS THAT SHOULD BE REPORTED IMMEDIATELY:  *FEVER GREATER THAN 101.0 F  *CHILLS WITH OR WITHOUT FEVER  NAUSEA AND VOMITING THAT IS NOT CONTROLLED WITH YOUR NAUSEA MEDICATION  *UNUSUAL SHORTNESS OF BREATH  *UNUSUAL BRUISING OR BLEEDING  TENDERNESS IN MOUTH AND THROAT WITH OR WITHOUT PRESENCE OF ULCERS  *URINARY PROBLEMS  *BOWEL PROBLEMS  UNUSUAL RASH Items with * indicate a potential emergency and should be followed up as soon as possible. If you have an emergency after office hours please contact your primary care physician or go to the nearest emergency department.  Please call the clinic during office hours if you have any questions or concerns.   You may also contact the Patient Navigator at [phone] should you have any questions or need assistance in obtaining follow up care. _____________________________________________________________________ Have you asked about our STAR program?    STAR stands for Survivorship Training and Rehabilitation, and this is a nationally recognized cancer care program that focuses on survivorship and rehabilitation.  Cancer and cancer treatments may cause problems, such as, pain, making you feel tired and keeping you from doing the things that you need or want to do. Cancer rehabilitation can help. Our goal is to reduce these troubling effects and help you have the best quality of life possible.  You may receive a survey from a nurse  that asks questions about your current state of health.  Based on the survey results, all eligible patients will be referred to the [facility] program for an evaluation so we can better serve you! A frequently asked questions sheet is available upon request.

## 2014-05-23 ENCOUNTER — Ambulatory Visit (HOSPITAL_COMMUNITY): Payer: Medicare Other

## 2014-06-03 NOTE — Progress Notes (Signed)
  Patient Details  Name: [name] [name] MRN: [id number] Date of Birth: [birth year]  Today's Date: 06/03/2014  Patient discharged due to not returning to therapy.  [name] [name] R 06/03/2014, 1:36 PM

## 2014-06-03 NOTE — Addendum Note (Signed)
Encounter addended by: Leia Alf, PT on: 06/03/2014  1:42 PM<BR>     Documentation filed: Episodes

## 2014-06-03 NOTE — Addendum Note (Signed)
Encounter addended by: Leia Alf, PT on: 06/03/2014  1:37 PM<BR>     Documentation filed: Letters, Notes Section

## 2014-06-05 ENCOUNTER — Other Ambulatory Visit (HOSPITAL_COMMUNITY): Payer: Self-pay | Admitting: Hematology and Oncology

## 2014-06-07 ENCOUNTER — Other Ambulatory Visit (HOSPITAL_COMMUNITY): Payer: Self-pay | Admitting: Hematology and Oncology

## 2014-06-07 DIAGNOSIS — C8318 Mantle cell lymphoma, lymph nodes of multiple sites: Secondary | ICD-10-CM

## 2014-06-07 MED ORDER — ACYCLOVIR 400 MG PO TABS: 400.0000 mg | ORAL_TABLET | Freq: Every day | ORAL | Status: DC

## 2014-06-07 MED ORDER — LIDOCAINE-PRILOCAINE 2.5-2.5 % EX CREA: TOPICAL_CREAM | CUTANEOUS | Status: DC

## 2014-06-11 ENCOUNTER — Other Ambulatory Visit: Payer: Self-pay | Admitting: Cardiovascular Disease

## 2014-06-11 NOTE — Telephone Encounter (Signed)
Rx was sent to pharmacy electronically. 

## 2014-06-16 NOTE — Progress Notes (Signed)
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 male returns for  regular  visit for followup of mantle cell lymphoma diagnosed by inguinal lymph node biopsy 01/30/2014 with first cycle of bendamustine and Rituxan with Neulasta support given on 02/26/2014.     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 There is also splenomegaly. The combination of findings is highly suspicious for lymphoma   01/29/2014 Initial Diagnosis Lymph node, biopsy, Right Groin- LN - MANTLE CELL LYMPHOMA.   02/06/2014 Cancer Staging Interpretation Peripheral Blood Flow Cytometry - A SMALL MONOCLONAL B-CELL POPULATION IDENTIFIED.   02/26/2014 -  Chemotherapy Bendamustine/Rituxan x 5 cycles with Neulasta support   05/13/2014 Remission PET- No evidence for residual or recurrent tumor within the neck, chest, abdomen, or pelvis.      I personally reviewed and went over laboratory results with the patient.  The results are noted within this dictation.  [name] [name] is here for his final planned cycle of BR therapy.  Following completion of this, he will move on to maintenance Rituxan therapy every 90 days x 2 years.  He is agreeable to this plan.  He tolerated cycle 4 well without any complaints.  He did not return for day 3 of cycle 4 for Neulasta injection.  He denies any complications associated with this.  Therefore, he requests day 3 of cycle 3 be cancelled too.   Hematologically, he denies any complaints and ROS questioning is negative.   Past  Medical History  Diagnosis Date  . Hypertension   . Hyperlipidemia   . Rhinitis   . Peripheral edema   . Osteoarthritis   . Obesity     morbid  . CAD (coronary artery disease)     s/p angioplasty  . ED (erectile dysfunction)   . OSA (obstructive sleep apnea)     severe, on CPAP 12  . Restless leg syndrome   . Insomnia   . Anxiety   . Bilateral lower extremity edema   . Tobacco abuse   . 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     has No Known Allergies.  [name] [name] had no medications administered during this visit.  Past Surgical History  Procedure Laterality Date  . Total knee arthroplasty      left  . Hemorrhoid surgery    . Back surgery  10/2009  . Eye surgery      lazer bil  . Coronary angioplasty    . Total knee arthroplasty  08/04/2012    right knee  . Total knee arthroplasty  08/04/2012    Procedure: TOTAL KNEE ARTHROPLASTY;  Surgeon: Yvette Rack., MD;  Location: [place];  Service: Orthopedics;  Laterality: Right;  WITH PATELLA RESURFACING  .  Lymph node biopsy Right 01/2014    groin area  . Portacath placement Right 2015    Denies any headaches, dizziness, double vision, fevers, chills, night sweats, nausea, vomiting, diarrhea, constipation, chest pain, heart palpitations, shortness of breath, blood in stool, black tarry stool, urinary pain, urinary burning, urinary frequency, hematuria.   PHYSICAL EXAMINATION  ECOG PERFORMANCE STATUS: 1 - Symptomatic but completely ambulatory  There were no vitals filed for this visit.  GENERAL:alert, no distress, well nourished, well  developed, comfortable, cooperative, obese and smiling SKIN: skin color, texture, turgor are normal, no rashes or significant lesions HEAD: Normocephalic, No masses, lesions, tenderness or abnormalities EYES: normal, PERRLA, EOMI, Conjunctiva are pink and non-injected EARS: External ears normal OROPHARYNX:lips, buccal mucosa, and tongue normal and mucous membranes are moist  NECK: supple, no adenopathy, thyroid normal size, non-tender, without nodularity, no stridor, non-tender, trachea midline LYMPH:  no palpable lymphadenopathy BREAST:not examined LUNGS: decreased breath sounds HEART: regular rate & rhythm, no murmurs, no gallops, S1 normal and S2 normal ABDOMEN:abdomen soft, non-tender, obese, normal bowel sounds and no masses or organomegaly BACK: Back symmetric, no curvature., No CVA tenderness EXTREMITIES:less then 2 second capillary refill, no joint deformities, effusion, or inflammation, skin discoloration, no clubbing, no cyanosis. B/L LE edema, 2-3+ pitting, chronic. NEURO: alert & oriented x 3 with fluent speech, no focal motor/sensory deficits    LABORATORY DATA: CBC    Component Value Date/Time   WBC 3.5* 06/18/2014 0935   RBC 4.35 06/18/2014 0935   RBC 5.00 02/06/2014 1530   HGB 14.3 06/18/2014 0935   HCT 40.6 06/18/2014 0935   PLT 156 06/18/2014 0935   MCV 93.3 06/18/2014 0935   MCH 32.9 06/18/2014 0935   MCHC 35.2 06/18/2014 0935   RDW 14.1 06/18/2014 0935   LYMPHSABS 0.4* 06/18/2014 0935   MONOABS 0.3 06/18/2014 0935   EOSABS 0.1 06/18/2014 0935   BASOSABS 0.0 06/18/2014 0935      Chemistry      Component Value Date/Time   NA 141 06/18/2014 0935   K 4.0 06/18/2014 0935   CL 104 06/18/2014 0935   CO2 24 06/18/2014 0935   BUN 17 06/18/2014 0935   CREATININE 0.99 06/18/2014 0935      Component Value Date/Time   CALCIUM 9.0 06/18/2014 0935   ALKPHOS 80 06/18/2014 0935   AST 15 06/18/2014 0935   ALT 14 06/18/2014 0935   BILITOT 0.4 06/18/2014 0935         ASSESSMENT:  1. Mantle cell lymphoma, at least stage III. CR following 3 cycles of BR therapy.  2. Chronic obstructive pulmonary disease.  3. Degenerative joint disease lumbar spine.  4. Coronary artery disease without evidence of heart failure or dysrhythmia.  5. Lower extremity varicosities.  6. Obstructive sleep apnea syndrome.  7. Restless leg syndrome.  8. Insomnia with obstructive sleep apnea, on CPAP, improved with flurazepam.  9. Allergic rhinitis, controlled. 10. Chronic LE edema  Patient Active Problem List   Diagnosis Date Noted  . Decreased mobility and endurance 04/25/2014  . Allergic rhinitis 03/05/2014  . 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 Chronic bronchitis 08/01/2013  . Varicose veins of lower extremities with other complications 24/23/5361  . Lower extremity edema 04/23/2013  . Osteoarthritis   . CAD (coronary artery disease)   . Anxiety   . CAD, NATIVE VESSEL 10/07/2009  . RESTLESS LEG SYNDROME 04/24/2009  . INSOMNIA 04/24/2009  . TOBACCO ABUSE 02/29/2008  . OSA (obstructive sleep  apnea) 02/29/2008     PLAN:  1. I personally reviewed and went over laboratory results with the patient.  The results are noted within this dictation. 2. Pre-chemotherapy labs 3. Final cycle of chemotherapy today, cycle 5. 4. Development of future Rituxan maintenance therapy following cycle 5. Cancel of day 3 of cycle 5, at patient's request 6. Labs in 3 months: CBC diff, CMET, LDH 7. Return in 12 weeks for follow-up and maintenance therapy.   THERAPY PLAN:  He is completing cycle 5 of systemic BR therapy after a complete response following cycle 3 of chemotherapy.  He will complete cycle 5 and then move on to maintenance Rituxan every 90 days.  Antibody plan is developed for this treatment.  Per NCCN guidelines, we will see him every 3-6 months for 5 years and then annually or as clinically indicated for follow-up.  All questions were  answered. The patient knows to call the clinic with any problems, questions or concerns. We can certainly see the patient much sooner if necessary.  Patient and plan discussed with Dr. Farrel Gobble and he is in agreement with the aforementioned.   KEFALAS,THOMAS 06/18/2014

## 2014-06-18 ENCOUNTER — Encounter (HOSPITAL_BASED_OUTPATIENT_CLINIC_OR_DEPARTMENT_OTHER): Payer: Medicare Other | Admitting: Oncology

## 2014-06-18 ENCOUNTER — Encounter (HOSPITAL_COMMUNITY): Payer: Self-pay | Admitting: Oncology

## 2014-06-18 ENCOUNTER — Encounter (HOSPITAL_COMMUNITY): Payer: Medicare Other | Attending: Hematology and Oncology

## 2014-06-18 VITALS — BP 128/65 | HR 71 | Temp 98.2°F | Resp 20 | Wt 302.0 lb

## 2014-06-18 DIAGNOSIS — C8318 Mantle cell lymphoma, lymph nodes of multiple sites: Secondary | ICD-10-CM | POA: Insufficient documentation

## 2014-06-18 DIAGNOSIS — C8310 Mantle cell lymphoma, unspecified site: Secondary | ICD-10-CM | POA: Diagnosis present

## 2014-06-18 DIAGNOSIS — Z5111 Encounter for antineoplastic chemotherapy: Secondary | ICD-10-CM

## 2014-06-18 LAB — CBC WITH DIFFERENTIAL/PLATELET
Basophils Absolute: 0 10*3/uL (ref 0.0–0.1)
Basophils Relative: 1 % (ref 0–1)
Eosinophils Absolute: 0.1 10*3/uL (ref 0.0–0.7)
Eosinophils Relative: 4 % (ref 0–5)
HCT: 40.6 % (ref 39.0–52.0)
HEMOGLOBIN: 14.3 g/dL (ref 13.0–17.0)
Lymphocytes Relative: 12 % (ref 12–46)
Lymphs Abs: 0.4 10*3/uL — ABNORMAL LOW (ref 0.7–4.0)
MCH: 32.9 pg (ref 26.0–34.0)
MCHC: 35.2 g/dL (ref 30.0–36.0)
MCV: 93.3 fL (ref 78.0–100.0)
MONOS PCT: 8 % (ref 3–12)
Monocytes Absolute: 0.3 10*3/uL (ref 0.1–1.0)
NEUTROS ABS: 2.7 10*3/uL (ref 1.7–7.7)
Neutrophils Relative %: 75 % (ref 43–77)
PLATELETS: 156 10*3/uL (ref 150–400)
RBC: 4.35 MIL/uL (ref 4.22–5.81)
RDW: 14.1 % (ref 11.5–15.5)
WBC: 3.5 10*3/uL — ABNORMAL LOW (ref 4.0–10.5)

## 2014-06-18 LAB — COMPREHENSIVE METABOLIC PANEL
ALK PHOS: 80 U/L (ref 39–117)
ALT: 14 U/L (ref 0–53)
ANION GAP: 13 (ref 5–15)
AST: 15 U/L (ref 0–37)
Albumin: 3.6 g/dL (ref 3.5–5.2)
BILIRUBIN TOTAL: 0.4 mg/dL (ref 0.3–1.2)
BUN: 17 mg/dL (ref 6–23)
CHLORIDE: 104 meq/L (ref 96–112)
CO2: 24 mEq/L (ref 19–32)
Calcium: 9 mg/dL (ref 8.4–10.5)
Creatinine, Ser: 0.99 mg/dL (ref 0.50–1.35)
GFR calc Af Amer: >90 mL/min (ref >90)
GFR calc non Af Amer: 80 mL/min — ABNORMAL LOW (ref >90)
Glucose, Bld: 124 mg/dL — ABNORMAL HIGH (ref 70–99)
Potassium: 4 mEq/L (ref 3.7–5.3)
SODIUM: 141 meq/L (ref 137–147)
TOTAL PROTEIN: 7 g/dL (ref 6.0–8.3)

## 2014-06-18 LAB — URIC ACID: Uric Acid, Serum: 3.9 mg/dL — ABNORMAL LOW (ref 4.0–7.8)

## 2014-06-18 MED ORDER — SODIUM CHLORIDE 0.9 % IJ SOLN: 10.0000 mL | INTRAMUSCULAR | Status: DC | PRN

## 2014-06-18 MED ORDER — SODIUM CHLORIDE 0.9 % IV SOLN
Freq: Once | INTRAVENOUS | Status: AC
  Administered 2014-06-18: 16 mg via INTRAVENOUS
  Filled 2014-06-18: qty 8

## 2014-06-18 MED ORDER — HEPARIN SOD (PORK) LOCK FLUSH 100 UNIT/ML IV SOLN
500.0000 [IU] | Freq: Once | INTRAVENOUS | Status: AC
  Administered 2014-06-18: 500 [IU] via INTRAVENOUS

## 2014-06-18 MED ORDER — SODIUM CHLORIDE 0.9 % IV SOLN
375.0000 mg/m2 | Freq: Once | INTRAVENOUS | Status: AC
  Administered 2014-06-18: 1000 mg via INTRAVENOUS
  Filled 2014-06-18: qty 100

## 2014-06-18 MED ORDER — SODIUM CHLORIDE 0.9 % IV SOLN
70.0000 mg/m2 | Freq: Once | INTRAVENOUS | Status: AC
  Administered 2014-06-18: 180 mg via INTRAVENOUS
  Filled 2014-06-18: qty 2

## 2014-06-18 MED ORDER — SODIUM CHLORIDE 0.9 % IV SOLN
Freq: Once | INTRAVENOUS | Status: AC
  Administered 2014-06-18: 10:00:00 via INTRAVENOUS

## 2014-06-18 MED ORDER — DIPHENHYDRAMINE HCL 25 MG PO CAPS
50.0000 mg | ORAL_CAPSULE | Freq: Once | ORAL | Status: AC
  Administered 2014-06-18: 50 mg via ORAL
  Filled 2014-06-18: qty 2

## 2014-06-18 MED ORDER — HEPARIN SOD (PORK) LOCK FLUSH 100 UNIT/ML IV SOLN
500.0000 [IU] | Freq: Once | INTRAVENOUS | Status: AC | PRN
  Administered 2014-06-18: 500 [IU]
  Filled 2014-06-18: qty 5

## 2014-06-18 MED ORDER — ACETAMINOPHEN 325 MG PO TABS
650.0000 mg | ORAL_TABLET | Freq: Once | ORAL | Status: AC
  Administered 2014-06-18: 650 mg via ORAL
  Filled 2014-06-18: qty 2

## 2014-06-18 NOTE — Progress Notes (Signed)
Tolerated treatment well.  A&Ox4; in no apparent distress.

## 2014-06-18 NOTE — Patient Instructions (Signed)
[facility] Discharge Instructions  RECOMMENDATIONS MADE BY THE CONSULTANT AND ANY TEST RESULTS WILL BE SENT TO YOUR REFERRING PHYSICIAN.  EXAM FINDINGS BY THE PHYSICIAN TODAY AND SIGNS OR SYMPTOMS TO REPORT TO CLINIC OR PRIMARY PHYSICIAN: Exam and findings as discussed by [name], PA-C.  After completion of this cycle of chemotherapy we will begin maintenance therapy. Report fevers, night sweats or other concerns.  MEDICATIONS PRESCRIBED:  none  INSTRUCTIONS/FOLLOW-UP: Follow-up in 3 months with labs, office visit and chemotherapy.  Thank you for choosing [facility] to provide your oncology and hematology care.  To afford each patient quality time with our providers, please arrive at least 15 minutes before your scheduled appointment time.  With your help, our goal is to use those 15 minutes to complete the necessary work-up to ensure our physicians have the information they need to help with your evaluation and healthcare recommendations.    Effective January 1st, 2014, we ask that you re-schedule your appointment with our physicians should you arrive 10 or more minutes late for your appointment.  We strive to give you quality time with our providers, and arriving late affects you and other patients whose appointments are after yours.    Again, thank you for choosing [facility].  Our hope is that these requests will decrease the amount of time that you wait before being seen by our physicians.       _____________________________________________________________  Should you have questions after your visit to [facility], please contact our office at [phone] between the hours of 8:30 a.m. and 4:30 p.m.  Voicemails left after 4:30 p.m. will not be returned until the following business day.  For prescription refill requests, have your pharmacy contact our office with your prescription refill request.     _______________________________________________________________  We hope that we have given you very good care.  You may receive a patient satisfaction survey in the mail, please complete it and return it as soon as possible.  We value your feedback!  _______________________________________________________________  Have you asked about our STAR program?  STAR stands for Survivorship Training and Rehabilitation, and this is a nationally recognized cancer care program that focuses on survivorship and rehabilitation.  Cancer and cancer treatments may cause problems, such as, pain, making you feel tired and keeping you from doing the things that you need or want to do. Cancer rehabilitation can help. Our goal is to reduce these troubling effects and help you have the best quality of life possible.  You may receive a survey from a nurse that asks questions about your current state of health.  Based on the survey results, all eligible patients will be referred to the [facility] program for an evaluation so we can better serve you!  A frequently asked questions sheet is available upon request.

## 2014-06-19 ENCOUNTER — Encounter (HOSPITAL_BASED_OUTPATIENT_CLINIC_OR_DEPARTMENT_OTHER): Payer: Medicare Other

## 2014-06-19 VITALS — BP 134/79 | HR 74 | Temp 98.2°F | Resp 20

## 2014-06-19 DIAGNOSIS — Z5111 Encounter for antineoplastic chemotherapy: Secondary | ICD-10-CM

## 2014-06-19 DIAGNOSIS — C8318 Mantle cell lymphoma, lymph nodes of multiple sites: Secondary | ICD-10-CM

## 2014-06-19 MED ORDER — BENDAMUSTINE HCL CHEMO INJECTION 180 MG/2ML
70.0000 mg/m2 | Freq: Once | INTRAVENOUS | Status: AC
  Administered 2014-06-19: 180 mg via INTRAVENOUS
  Filled 2014-06-19: qty 2

## 2014-06-19 MED ORDER — HEPARIN SOD (PORK) LOCK FLUSH 100 UNIT/ML IV SOLN
INTRAVENOUS | Status: AC
  Filled 2014-06-19: qty 5

## 2014-06-19 MED ORDER — SODIUM CHLORIDE 0.9 % IV SOLN
Freq: Once | INTRAVENOUS | Status: AC
Start: 2014-06-19 — End: 2014-06-19
  Administered 2014-06-19: 11:00:00 via INTRAVENOUS

## 2014-06-19 MED ORDER — HEPARIN SOD (PORK) LOCK FLUSH 100 UNIT/ML IV SOLN
500.0000 [IU] | Freq: Once | INTRAVENOUS | Status: AC | PRN
  Administered 2014-06-19: 500 [IU]

## 2014-06-19 MED ORDER — ONDANSETRON HCL 40 MG/20ML IJ SOLN
Freq: Once | INTRAMUSCULAR | Status: AC
  Administered 2014-06-19: 16 mg via INTRAVENOUS
  Filled 2014-06-19: qty 8

## 2014-06-19 MED ORDER — SODIUM CHLORIDE 0.9 % IJ SOLN
10.0000 mL | INTRAMUSCULAR | Status: DC | PRN
  Administered 2014-06-19: 10 mL

## 2014-06-19 NOTE — Progress Notes (Signed)
Tolerated well. Patient reports he has not been taking neulasta injection because he hurts too bad afterwards, and he is not coming tomorrow for injection.

## 2014-06-19 NOTE — Progress Notes (Signed)
Consent signed for Rituxan as a single agent today.

## 2014-06-19 NOTE — Patient Instructions (Signed)
[facility] Discharge Instructions for Patients Receiving Chemotherapy  Today you received the following chemotherapy agents Bendamustine Cycle 5 Day 2. Return to clinic tomorrow foe Neulasta injection. Return to clinic every 6 weeks for port flush. Return to clinic in 12 weeks for maintenance Rituxan. Report any issues/concerns to clinic prior to appointments.  To help prevent nausea and vomiting after your treatment, we encourage you to take your nausea medication as instructed.   If you develop nausea and vomiting that is not controlled by your nausea medication, call the clinic. If it is after clinic hours your family physician or the after hours number for the clinic or go to the Emergency Department.   BELOW ARE SYMPTOMS THAT SHOULD BE REPORTED IMMEDIATELY:  *FEVER GREATER THAN 101.0 F  *CHILLS WITH OR WITHOUT FEVER  NAUSEA AND VOMITING THAT IS NOT CONTROLLED WITH YOUR NAUSEA MEDICATION  *UNUSUAL SHORTNESS OF BREATH  *UNUSUAL BRUISING OR BLEEDING  TENDERNESS IN MOUTH AND THROAT WITH OR WITHOUT PRESENCE OF ULCERS  *URINARY PROBLEMS  *BOWEL PROBLEMS  UNUSUAL RASH Items with * indicate a potential emergency and should be followed up as soon as possible.  I have been informed and understand all the instructions given to me. I know to contact the clinic, my physician, or go to the Emergency Department if any problems should occur. I do not have any questions at this time, but understand that I may call the clinic during office hours or the Patient Navigator at [phone] should I have any questions or need assistance in obtaining follow up care.    __________________________________________  _____________  __________ Signature of Patient or Authorized Representative            Date                   Time    __________________________________________ Nurse's Signature

## 2014-06-20 ENCOUNTER — Ambulatory Visit (HOSPITAL_COMMUNITY): Payer: Medicare Other

## 2014-06-27 ENCOUNTER — Other Ambulatory Visit (HOSPITAL_COMMUNITY): Payer: Self-pay | Admitting: Hematology and Oncology

## 2014-06-27 MED ORDER — LORATADINE-PSEUDOEPHEDRINE ER 10-240 MG PO TB24: 1.0000 | ORAL_TABLET | Freq: Every day | ORAL | Status: DC

## 2014-07-31 ENCOUNTER — Encounter (HOSPITAL_COMMUNITY): Payer: Medicare Other | Attending: Hematology and Oncology

## 2014-07-31 VITALS — BP 113/64 | HR 76 | Resp 18

## 2014-07-31 DIAGNOSIS — C8310 Mantle cell lymphoma, unspecified site: Secondary | ICD-10-CM | POA: Insufficient documentation

## 2014-07-31 DIAGNOSIS — Z452 Encounter for adjustment and management of vascular access device: Secondary | ICD-10-CM

## 2014-07-31 DIAGNOSIS — C8318 Mantle cell lymphoma, lymph nodes of multiple sites: Secondary | ICD-10-CM

## 2014-07-31 DIAGNOSIS — Z95828 Presence of other vascular implants and grafts: Secondary | ICD-10-CM

## 2014-07-31 MED ORDER — HEPARIN SOD (PORK) LOCK FLUSH 100 UNIT/ML IV SOLN
500.0000 [IU] | Freq: Once | INTRAVENOUS | Status: AC
  Administered 2014-07-31: 500 [IU] via INTRAVENOUS
  Filled 2014-07-31: qty 5

## 2014-07-31 MED ORDER — SODIUM CHLORIDE 0.9 % IJ SOLN
10.0000 mL | INTRAMUSCULAR | Status: DC | PRN
  Administered 2014-07-31: 10 mL via INTRAVENOUS
  Filled 2014-07-31: qty 10

## 2014-07-31 NOTE — Progress Notes (Signed)
[name] [name] presented for Portacath access and flush.  Proper placement of portacath confirmed by CXR.  Portacath located right chest wall accessed with  H 20 needle.  Good blood return present. Portacath flushed with 36ml NS and 500U/67ml Heparin and needle removed intact.  Procedure tolerated well and without incident.

## 2014-07-31 NOTE — Patient Instructions (Signed)
[facility] Discharge Instructions  RECOMMENDATIONS MADE BY THE CONSULTANT AND ANY TEST RESULTS WILL BE SENT TO YOUR REFERRING PHYSICIAN.  You had a port flush.  Please call the clinic if you have any questions or concerns.  Thank you for choosing [facility] to provide your oncology and hematology care.  To afford each patient quality time with our providers, please arrive at least 15 minutes before your scheduled appointment time.  With your help, our goal is to use those 15 minutes to complete the necessary work-up to ensure our physicians have the information they need to help with your evaluation and healthcare recommendations.    Effective January 1st, 2014, we ask that you re-schedule your appointment with our physicians should you arrive 10 or more minutes late for your appointment.  We strive to give you quality time with our providers, and arriving late affects you and other patients whose appointments are after yours.    Again, thank you for choosing [facility].  Our hope is that these requests will decrease the amount of time that you wait before being seen by our physicians.       _____________________________________________________________  Should you have questions after your visit to [facility], please contact our office at [phone] between the hours of 8:30 a.m. and 5:00 p.m.  Voicemails left after 4:30 p.m. will not be returned until the following business day.  For prescription refill requests, have your pharmacy contact our office with your prescription refill request.

## 2014-08-07 ENCOUNTER — Other Ambulatory Visit: Payer: Self-pay | Admitting: Cardiovascular Disease

## 2014-08-07 NOTE — Telephone Encounter (Signed)
Rx was sent to pharmacy electronically. 

## 2014-08-20 ENCOUNTER — Other Ambulatory Visit: Payer: Self-pay | Admitting: Cardiovascular Disease

## 2014-08-21 NOTE — Telephone Encounter (Signed)
Rx(s) sent to pharmacy electronically.  

## 2014-08-22 ENCOUNTER — Other Ambulatory Visit (HOSPITAL_COMMUNITY): Payer: Self-pay | Admitting: Hematology and Oncology

## 2014-08-23 ENCOUNTER — Other Ambulatory Visit (HOSPITAL_COMMUNITY): Payer: Self-pay | Admitting: Hematology and Oncology

## 2014-08-23 MED ORDER — LORATADINE-PSEUDOEPHEDRINE ER 10-240 MG PO TB24: 1.0000 | ORAL_TABLET | Freq: Every day | ORAL | Status: DC

## 2014-09-04 ENCOUNTER — Encounter: Payer: Self-pay | Admitting: Internal Medicine

## 2014-09-04 ENCOUNTER — Ambulatory Visit (INDEPENDENT_AMBULATORY_CARE_PROVIDER_SITE_OTHER): Payer: Medicare Other | Admitting: Internal Medicine

## 2014-09-04 VITALS — BP 144/78 | HR 71 | Ht 68.0 in | Wt 303.0 lb

## 2014-09-04 DIAGNOSIS — R0789 Other chest pain: Secondary | ICD-10-CM

## 2014-09-04 DIAGNOSIS — I8390 Asymptomatic varicose veins of unspecified lower extremity: Secondary | ICD-10-CM

## 2014-09-04 DIAGNOSIS — I2510 Atherosclerotic heart disease of native coronary artery without angina pectoris: Secondary | ICD-10-CM | POA: Diagnosis not present

## 2014-09-04 DIAGNOSIS — I868 Varicose veins of other specified sites: Secondary | ICD-10-CM

## 2014-09-04 DIAGNOSIS — R079 Chest pain, unspecified: Secondary | ICD-10-CM | POA: Insufficient documentation

## 2014-09-04 NOTE — Patient Instructions (Signed)
Follow up with Dr.Hilty as needed.
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
## 2014-09-10 ENCOUNTER — Encounter: Payer: Self-pay | Admitting: Internal Medicine

## 2014-09-13 ENCOUNTER — Other Ambulatory Visit: Payer: Self-pay | Admitting: Pulmonary Disease

## 2014-09-15 NOTE — Progress Notes (Signed)
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 There is also splenomegaly. The combination of findings is highly suspicious for lymphoma   01/29/2014 Initial Diagnosis Lymph node, biopsy, Right Groin- LN - MANTLE CELL LYMPHOMA.   02/06/2014 Cancer Staging Interpretation Peripheral Blood Flow Cytometry - A SMALL MONOCLONAL B-CELL POPULATION IDENTIFIED.   02/26/2014 - 06/19/2014 Chemotherapy Bendamustine/Rituxan x 5 cycles with Neulasta support   05/13/2014 Remission PET- No evidence for residual or recurrent tumor within the neck, chest, abdomen, or pelvis.    09/17/2014 -  Chemotherapy Rituxan maintenance every 60 days x  2 years     I personally reviewed and went over laboratory results with the patient.  The results are noted within this dictation.  Chart is reviewed.  It is noted that the patient was seen by Dr. Debara Pickett (Cardiologist) for chest pain.  It was recommended that [facility] undergo a stress test, but he declined.   Hematologically, he denies any complaints and ROS questioning is negative.   Past Medical History  Diagnosis Date  . Hypertension   . Hyperlipidemia   . Rhinitis   . Peripheral edema     . Osteoarthritis   . Obesity     morbid  . CAD (coronary artery disease)     s/p angioplasty  . ED (erectile dysfunction)   . OSA (obstructive sleep apnea)     severe, on CPAP 12  . Restless leg syndrome   . Insomnia   . Anxiety   . Bilateral lower extremity edema   . Tobacco abuse   . 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     has No Known Allergies.  [name] [name] had no medications administered during this visit.  Past Surgical History  Procedure Laterality Date  . Total knee arthroplasty      left  . Hemorrhoid surgery    . Back surgery  10/2009  . Eye surgery      lazer bil  . Coronary angioplasty    . Total knee arthroplasty  08/04/2012    right knee  . Total knee arthroplasty  08/04/2012    Procedure: TOTAL KNEE ARTHROPLASTY;  Surgeon: Yvette Rack., MD;  Location: [place];  Service: Orthopedics;  Laterality: Right;  WITH PATELLA RESURFACING  . Lymph node biopsy Right 01/2014    groin area  . Portacath placement Right 2015    Denies any headaches, dizziness, double vision, fevers, chills, night  sweats, nausea, vomiting, diarrhea, constipation, chest pain, heart palpitations, shortness of breath, blood in stool, black tarry stool, urinary pain, urinary burning, urinary frequency, hematuria.   PHYSICAL EXAMINATION  ECOG PERFORMANCE STATUS: 1 - Symptomatic but completely ambulatory  There were no vitals filed for this visit.  GENERAL:alert, no distress, well nourished, well developed, comfortable, cooperative, obese and smiling SKIN: skin color, texture, turgor are normal, no rashes or significant lesions HEAD:  Normocephalic, No masses, lesions, tenderness or abnormalities EYES: normal, PERRLA, EOMI, Conjunctiva are pink and non-injected EARS: External ears normal OROPHARYNX:mucous membranes are moist  NECK: supple, no adenopathy, thyroid normal size, non-tender, without nodularity, no stridor, non-tender, trachea midline LYMPH:  no palpable lymphadenopathy BREAST:not examined LUNGS: clear to auscultation  HEART: regular rate & rhythm ABDOMEN:abdomen soft, non-tender, obese and normal bowel sounds BACK: Back symmetric, no curvature. EXTREMITIES:less then 2 second capillary refill, no joint deformities, effusion, or inflammation, no skin discoloration, no cyanosis, positive findings:  edema B/L LE edema, 2+ pitting  NEURO: alert & oriented x 3 with fluent speech, no focal motor/sensory deficits, gait normal   LABORATORY DATA: CBC    Component Value Date/Time   WBC 3.5* 09/17/2014 0945   RBC 3.95* 09/17/2014 0945   RBC 5.00 02/06/2014 1530   HGB 13.4 09/17/2014 0945   HCT 38.5* 09/17/2014 0945   PLT 162 09/17/2014 0945   MCV 97.5 09/17/2014 0945   MCH 33.9 09/17/2014 0945   MCHC 34.8 09/17/2014 0945   RDW 13.5 09/17/2014 0945   LYMPHSABS 0.2* 09/17/2014 0945   MONOABS 0.5 09/17/2014 0945   EOSABS 0.1 09/17/2014 0945   BASOSABS 0.0 09/17/2014 0945      Chemistry      Component Value Date/Time   NA 138 09/17/2014 0945   K 4.1 09/17/2014 0945   CL 108 09/17/2014 0945   CO2 24 09/17/2014 0945   BUN 15 09/17/2014 0945   CREATININE 0.93 09/17/2014 0945      Component Value Date/Time   CALCIUM 8.5 09/17/2014 0945   ALKPHOS 68 09/17/2014 0945   AST 18 09/17/2014 0945   ALT 15 09/17/2014 0945   BILITOT 0.8 09/17/2014 0945      ASSESSMENT AND PLAN:  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 S/P 5 cycles of  Bendamustine/Rituxan after complete remission was noted on PET scan following cycle 2 of therapy.  Now on maintenance Rituxan beginning today.  Labs today and labs in 2 months: CBC diff, CMET, LDH, B2M.  Return in 2 months for follow-up and next Rituxan infusion.      THERAPY PLAN:  Continue maintenance Rituxan every 2 months x 2 years.   All questions were answered. The patient knows to call the clinic with any problems, questions or concerns. We can certainly see the patient much sooner if necessary.  Patient and plan discussed with Dr. Ancil Linsey and she is in agreement with the aforementioned.   [name] [name] 09/17/2014

## 2014-09-15 NOTE — Assessment & Plan Note (Addendum)
Mantle cell lymphoma diagnosed by inguinal lymph node biopsy 01/30/2014 with first cycle of bendamustine and Rituxan with Neulasta support given on 02/26/2014. S/P 5 cycles of Bendamustine/Rituxan after complete remission was noted on PET scan following cycle 2 of therapy.  Now on maintenance Rituxan beginning today.  Labs today and labs in 2 months: CBC diff, CMET, LDH, B2M.  Return in 2 months for follow-up and next Rituxan infusion.

## 2014-09-17 ENCOUNTER — Encounter (HOSPITAL_BASED_OUTPATIENT_CLINIC_OR_DEPARTMENT_OTHER): Payer: Medicare Other | Admitting: Oncology

## 2014-09-17 ENCOUNTER — Encounter (HOSPITAL_COMMUNITY): Payer: Self-pay

## 2014-09-17 ENCOUNTER — Encounter (HOSPITAL_COMMUNITY): Payer: Medicare Other | Attending: Hematology and Oncology

## 2014-09-17 DIAGNOSIS — Z5112 Encounter for antineoplastic immunotherapy: Secondary | ICD-10-CM

## 2014-09-17 DIAGNOSIS — C8310 Mantle cell lymphoma, unspecified site: Secondary | ICD-10-CM | POA: Diagnosis not present

## 2014-09-17 DIAGNOSIS — C8318 Mantle cell lymphoma, lymph nodes of multiple sites: Secondary | ICD-10-CM

## 2014-09-17 LAB — CBC WITH DIFFERENTIAL/PLATELET
BASOS PCT: 0 % (ref 0–1)
Basophils Absolute: 0 10*3/uL (ref 0.0–0.1)
Eosinophils Absolute: 0.1 10*3/uL (ref 0.0–0.7)
Eosinophils Relative: 3 % (ref 0–5)
HCT: 38.5 % — ABNORMAL LOW (ref 39.0–52.0)
Hemoglobin: 13.4 g/dL (ref 13.0–17.0)
LYMPHS ABS: 0.2 10*3/uL — AB (ref 0.7–4.0)
Lymphocytes Relative: 5 % — ABNORMAL LOW (ref 12–46)
MCH: 33.9 pg (ref 26.0–34.0)
MCHC: 34.8 g/dL (ref 30.0–36.0)
MCV: 97.5 fL (ref 78.0–100.0)
MONOS PCT: 13 % — AB (ref 3–12)
Monocytes Absolute: 0.5 10*3/uL (ref 0.1–1.0)
Neutro Abs: 2.8 10*3/uL (ref 1.7–7.7)
Neutrophils Relative %: 79 % — ABNORMAL HIGH (ref 43–77)
Platelets: 162 10*3/uL (ref 150–400)
RBC: 3.95 MIL/uL — ABNORMAL LOW (ref 4.22–5.81)
RDW: 13.5 % (ref 11.5–15.5)
WBC: 3.5 10*3/uL — ABNORMAL LOW (ref 4.0–10.5)

## 2014-09-17 LAB — COMPREHENSIVE METABOLIC PANEL
ALT: 15 U/L (ref 0–53)
AST: 18 U/L (ref 0–37)
Albumin: 4 g/dL (ref 3.5–5.2)
Alkaline Phosphatase: 68 U/L (ref 39–117)
Anion gap: 6 (ref 5–15)
BUN: 15 mg/dL (ref 6–23)
CHLORIDE: 108 meq/L (ref 96–112)
CO2: 24 mmol/L (ref 19–32)
Calcium: 8.5 mg/dL (ref 8.4–10.5)
Creatinine, Ser: 0.93 mg/dL (ref 0.50–1.35)
GFR calc Af Amer: >90 mL/min (ref >90)
GFR, EST NON AFRICAN AMERICAN: 83 mL/min — AB (ref >90)
Glucose, Bld: 109 mg/dL — ABNORMAL HIGH (ref 70–99)
Potassium: 4.1 mmol/L (ref 3.5–5.1)
Sodium: 138 mmol/L (ref 135–145)
Total Bilirubin: 0.8 mg/dL (ref 0.3–1.2)
Total Protein: 6.6 g/dL (ref 6.0–8.3)

## 2014-09-17 LAB — LACTATE DEHYDROGENASE: LDH: 122 U/L (ref 94–250)

## 2014-09-17 MED ORDER — DIPHENHYDRAMINE HCL 25 MG PO CAPS
50.0000 mg | ORAL_CAPSULE | Freq: Once | ORAL | Status: AC
  Administered 2014-09-17: 50 mg via ORAL
  Filled 2014-09-17: qty 2

## 2014-09-17 MED ORDER — SODIUM CHLORIDE 0.9 % IJ SOLN: 10.0000 mL | INTRAMUSCULAR | Status: DC | PRN

## 2014-09-17 MED ORDER — CLONAZEPAM 1 MG PO TABS: 1.0000 mg | ORAL_TABLET | Freq: Every day | ORAL | Status: DC

## 2014-09-17 MED ORDER — ACETAMINOPHEN 325 MG PO TABS
650.0000 mg | ORAL_TABLET | Freq: Once | ORAL | Status: AC
  Administered 2014-09-17: 650 mg via ORAL
  Filled 2014-09-17: qty 2

## 2014-09-17 MED ORDER — SODIUM CHLORIDE 0.9 % IV SOLN
375.0000 mg/m2 | Freq: Once | INTRAVENOUS | Status: AC
Start: 2014-09-17 — End: 2014-09-17
  Administered 2014-09-17: 1000 mg via INTRAVENOUS
  Filled 2014-09-17: qty 100

## 2014-09-17 MED ORDER — HEPARIN SOD (PORK) LOCK FLUSH 100 UNIT/ML IV SOLN
500.0000 [IU] | Freq: Once | INTRAVENOUS | Status: AC | PRN
  Administered 2014-09-17: 500 [IU]
  Filled 2014-09-17 (×2): qty 5

## 2014-09-17 MED ORDER — SODIUM CHLORIDE 0.9 % IV SOLN
Freq: Once | INTRAVENOUS | Status: AC
  Administered 2014-09-17: 10:00:00 via INTRAVENOUS

## 2014-09-17 NOTE — Progress Notes (Signed)
Patient tolerated treatment well.

## 2014-09-17 NOTE — Patient Instructions (Signed)
[place] at [facility]  Discharge Instructions:  We will perform labs in 2 months. Return in 2 months for your next Rituxan infusion and office appointment.   Please call the [facility] with any questions or concerns.   _______________________________________________________________  Thank you for choosing [place] at [facility] to provide your oncology and hematology care.  To afford each patient quality time with our providers, please arrive at least 15 minutes before your scheduled appointment.  You need to re-schedule your appointment if you arrive 10 or more minutes late.  We strive to give you quality time with our providers, and arriving late affects you and other patients whose appointments are after yours.  Also, if you no show three or more times for appointments you may be dismissed from the clinic.  Again, thank you for choosing [place] [facility] [place] hope is that these requests will allow you access to exceptional care and in a timely manner. _______________________________________________________________  If you have questions after your visit, please contact our office at [phone] between the hours of 8:30 a.m. and 5:00 p.m. Voicemails left after 4:30 p.m. will not be returned until the following business day. _______________________________________________________________  For prescription refill requests, have your pharmacy contact our office. _______________________________________________________________  Recommendations made by the consultant and any test results will be sent to your referring physician. _______________________________________________________________

## 2014-09-24 ENCOUNTER — Other Ambulatory Visit (HOSPITAL_COMMUNITY): Payer: Self-pay | Admitting: Hematology and Oncology

## 2014-10-11 ENCOUNTER — Other Ambulatory Visit: Payer: Self-pay | Admitting: Pulmonary Disease

## 2014-10-23 ENCOUNTER — Encounter (HOSPITAL_COMMUNITY): Payer: Self-pay | Admitting: Family Medicine

## 2014-10-23 ENCOUNTER — Ambulatory Visit (HOSPITAL_COMMUNITY): Admit: 2014-10-23 | Payer: Self-pay | Admitting: Cardiology

## 2014-10-23 ENCOUNTER — Inpatient Hospital Stay (HOSPITAL_COMMUNITY)
Admission: EM | Admit: 2014-10-23 | Discharge: 2014-10-26 | DRG: 270 | Disposition: A | Discharge status: Home / Self Care | Payer: Medicare Other | Source: Ambulatory Visit | Attending: Cardiology | Admitting: Cardiology

## 2014-10-23 ENCOUNTER — Encounter (HOSPITAL_COMMUNITY): Payer: Self-pay

## 2014-10-23 ENCOUNTER — Encounter (HOSPITAL_COMMUNITY)
Admission: EM | Disposition: A | Discharge status: Home / Self Care | Payer: Medicare Other | Source: Ambulatory Visit | Attending: Cardiology

## 2014-10-23 DIAGNOSIS — C8318 Mantle cell lymphoma, lymph nodes of multiple sites: Secondary | ICD-10-CM | POA: Diagnosis present

## 2014-10-23 DIAGNOSIS — Z7982 Long term (current) use of aspirin: Secondary | ICD-10-CM | POA: Diagnosis not present

## 2014-10-23 DIAGNOSIS — Z6841 Body Mass Index (BMI) 40.0 and over, adult: Secondary | ICD-10-CM | POA: Diagnosis not present

## 2014-10-23 DIAGNOSIS — I255 Ischemic cardiomyopathy: Secondary | ICD-10-CM | POA: Diagnosis present

## 2014-10-23 DIAGNOSIS — E785 Hyperlipidemia, unspecified: Secondary | ICD-10-CM | POA: Diagnosis present

## 2014-10-23 DIAGNOSIS — R570 Cardiogenic shock: Secondary | ICD-10-CM | POA: Diagnosis not present

## 2014-10-23 DIAGNOSIS — Z9861 Coronary angioplasty status: Secondary | ICD-10-CM

## 2014-10-23 DIAGNOSIS — Z96653 Presence of artificial knee joint, bilateral: Secondary | ICD-10-CM | POA: Diagnosis present

## 2014-10-23 DIAGNOSIS — I2109 ST elevation (STEMI) myocardial infarction involving other coronary artery of anterior wall: Secondary | ICD-10-CM | POA: Diagnosis not present

## 2014-10-23 DIAGNOSIS — N400 Enlarged prostate without lower urinary tract symptoms: Secondary | ICD-10-CM | POA: Diagnosis not present

## 2014-10-23 DIAGNOSIS — Z720 Tobacco use: Secondary | ICD-10-CM

## 2014-10-23 DIAGNOSIS — F17200 Nicotine dependence, unspecified, uncomplicated: Secondary | ICD-10-CM | POA: Diagnosis present

## 2014-10-23 DIAGNOSIS — G4733 Obstructive sleep apnea (adult) (pediatric): Secondary | ICD-10-CM | POA: Diagnosis not present

## 2014-10-23 DIAGNOSIS — C8310 Mantle cell lymphoma, unspecified site: Secondary | ICD-10-CM

## 2014-10-23 DIAGNOSIS — F17210 Nicotine dependence, cigarettes, uncomplicated: Secondary | ICD-10-CM | POA: Diagnosis present

## 2014-10-23 DIAGNOSIS — I2102 ST elevation (STEMI) myocardial infarction involving left anterior descending coronary artery: Secondary | ICD-10-CM | POA: Diagnosis not present

## 2014-10-23 DIAGNOSIS — I2510 Atherosclerotic heart disease of native coronary artery without angina pectoris: Secondary | ICD-10-CM

## 2014-10-23 DIAGNOSIS — I959 Hypotension, unspecified: Secondary | ICD-10-CM | POA: Diagnosis present

## 2014-10-23 DIAGNOSIS — I739 Peripheral vascular disease, unspecified: Secondary | ICD-10-CM | POA: Diagnosis not present

## 2014-10-23 DIAGNOSIS — R079 Chest pain, unspecified: Secondary | ICD-10-CM | POA: Diagnosis not present

## 2014-10-23 DIAGNOSIS — Z8572 Personal history of non-Hodgkin lymphomas: Secondary | ICD-10-CM | POA: Diagnosis not present

## 2014-10-23 DIAGNOSIS — Z79899 Other long term (current) drug therapy: Secondary | ICD-10-CM

## 2014-10-23 DIAGNOSIS — G2581 Restless legs syndrome: Secondary | ICD-10-CM | POA: Diagnosis not present

## 2014-10-23 DIAGNOSIS — M1990 Unspecified osteoarthritis, unspecified site: Secondary | ICD-10-CM | POA: Diagnosis present

## 2014-10-23 DIAGNOSIS — I10 Essential (primary) hypertension: Secondary | ICD-10-CM | POA: Diagnosis not present

## 2014-10-23 DIAGNOSIS — R600 Localized edema: Secondary | ICD-10-CM | POA: Diagnosis present

## 2014-10-23 DIAGNOSIS — I213 ST elevation (STEMI) myocardial infarction of unspecified site: Secondary | ICD-10-CM

## 2014-10-23 DIAGNOSIS — F419 Anxiety disorder, unspecified: Secondary | ICD-10-CM | POA: Diagnosis not present

## 2014-10-23 DIAGNOSIS — J9811 Atelectasis: Secondary | ICD-10-CM | POA: Diagnosis not present

## 2014-10-23 DIAGNOSIS — Z79891 Long term (current) use of opiate analgesic: Secondary | ICD-10-CM

## 2014-10-23 DIAGNOSIS — R0989 Other specified symptoms and signs involving the circulatory and respiratory systems: Secondary | ICD-10-CM | POA: Diagnosis not present

## 2014-10-23 DIAGNOSIS — I8390 Asymptomatic varicose veins of unspecified lower extremity: Secondary | ICD-10-CM | POA: Diagnosis present

## 2014-10-23 HISTORY — DX: Atherosclerotic heart disease of native coronary artery without angina pectoris: I25.10

## 2014-10-23 HISTORY — PX: PERCUTANEOUS CORONARY STENT INTERVENTION (PCI-S): SHX5485

## 2014-10-23 HISTORY — PX: INTRA-AORTIC BALLOON PUMP INSERTION: SHX5475

## 2014-10-23 HISTORY — PX: LEFT HEART CATHETERIZATION WITH CORONARY ANGIOGRAM: SHX5451

## 2014-10-23 HISTORY — DX: Ischemic cardiomyopathy: I25.5

## 2014-10-23 LAB — LIPID PANEL
CHOLESTEROL: 145 mg/dL (ref 0–200)
HDL: 27 mg/dL — ABNORMAL LOW (ref >39)
LDL Cholesterol: 94 mg/dL (ref 0–99)
TRIGLYCERIDES: 122 mg/dL (ref <150)
Total CHOL/HDL Ratio: 5.4 RATIO
VLDL: 24 mg/dL (ref 0–40)

## 2014-10-23 LAB — COMPREHENSIVE METABOLIC PANEL
ALT: 14 U/L (ref 0–53)
ANION GAP: 8 (ref 5–15)
AST: 15 U/L (ref 0–37)
Albumin: 3.9 g/dL (ref 3.5–5.2)
Alkaline Phosphatase: 71 U/L (ref 39–117)
BUN: 14 mg/dL (ref 6–23)
CALCIUM: 8.7 mg/dL (ref 8.4–10.5)
CO2: 24 mmol/L (ref 19–32)
CREATININE: 1.18 mg/dL (ref 0.50–1.35)
Chloride: 105 mmol/L (ref 96–112)
GFR calc Af Amer: 69 mL/min — ABNORMAL LOW (ref >90)
GFR, EST NON AFRICAN AMERICAN: 60 mL/min — AB (ref >90)
Glucose, Bld: 143 mg/dL — ABNORMAL HIGH (ref 70–99)
Potassium: 3.9 mmol/L (ref 3.5–5.1)
Sodium: 137 mmol/L (ref 135–145)
Total Bilirubin: 0.8 mg/dL (ref 0.3–1.2)
Total Protein: 6.4 g/dL (ref 6.0–8.3)

## 2014-10-23 LAB — CK TOTAL AND CKMB (NOT AT ARMC)
CK TOTAL: 43 U/L (ref 7–232)
CK, MB: 1.2 ng/mL (ref 0.3–4.0)
Relative Index: INVALID (ref 0.0–2.5)

## 2014-10-23 LAB — PROTIME-INR
INR: 1.05 (ref 0.00–1.49)
PROTHROMBIN TIME: 13.8 s (ref 11.6–15.2)

## 2014-10-23 LAB — CBC
HEMATOCRIT: 42 % (ref 39.0–52.0)
HEMOGLOBIN: 14.5 g/dL (ref 13.0–17.0)
MCH: 33.4 pg (ref 26.0–34.0)
MCHC: 34.5 g/dL (ref 30.0–36.0)
MCV: 96.8 fL (ref 78.0–100.0)
Platelets: 158 10*3/uL (ref 150–400)
RBC: 4.34 MIL/uL (ref 4.22–5.81)
RDW: 13 % (ref 11.5–15.5)
WBC: 5.8 10*3/uL (ref 4.0–10.5)

## 2014-10-23 LAB — MRSA PCR SCREENING: MRSA by PCR: NEGATIVE

## 2014-10-23 LAB — POCT I-STAT, CHEM 8
BUN: 15 mg/dL (ref 6–23)
CALCIUM ION: 1.19 mmol/L (ref 1.13–1.30)
CREATININE: 1.1 mg/dL (ref 0.50–1.35)
Chloride: 104 mmol/L (ref 96–112)
Glucose, Bld: 143 mg/dL — ABNORMAL HIGH (ref 70–99)
HEMATOCRIT: 45 % (ref 39.0–52.0)
Hemoglobin: 15.3 g/dL (ref 13.0–17.0)
Potassium: 4 mmol/L (ref 3.5–5.1)
Sodium: 139 mmol/L (ref 135–145)
TCO2: 19 mmol/L (ref 0–100)

## 2014-10-23 LAB — TROPONIN I
Troponin I: <0.03 ng/mL (ref <0.031)
Troponin I: >80 ng/mL (ref <0.031)

## 2014-10-23 LAB — POCT ACTIVATED CLOTTING TIME: Activated Clotting Time: 589 seconds

## 2014-10-23 LAB — APTT: aPTT: 29 seconds (ref 24–37)

## 2014-10-23 SURGERY — LEFT HEART CATHETERIZATION WITH CORONARY ANGIOGRAM

## 2014-10-23 MED ORDER — ADULT MULTIVITAMIN W/MINERALS CH
2.0000 | ORAL_TABLET | Freq: Every day | ORAL | Status: DC
  Administered 2014-10-24 – 2014-10-26 (×3): 2 via ORAL
  Filled 2014-10-23 (×3): qty 2

## 2014-10-23 MED ORDER — OXYCODONE-ACETAMINOPHEN 5-325 MG PO TABS
1.0000 | ORAL_TABLET | Freq: Four times a day (QID) | ORAL | Status: DC | PRN
  Administered 2014-10-23 – 2014-10-25 (×3): 1 via ORAL
  Filled 2014-10-23 (×3): qty 1

## 2014-10-23 MED ORDER — SODIUM CHLORIDE 0.9 % IV SOLN
4.0000 ug/kg/min | INTRAVENOUS | Status: DC
  Filled 2014-10-23: qty 50

## 2014-10-23 MED ORDER — TICAGRELOR 90 MG PO TABS
ORAL_TABLET | ORAL | Status: AC
  Filled 2014-10-23: qty 2

## 2014-10-23 MED ORDER — ATORVASTATIN CALCIUM 80 MG PO TABS
80.0000 mg | ORAL_TABLET | Freq: Every day | ORAL | Status: DC
  Administered 2014-10-23 – 2014-10-25 (×3): 80 mg via ORAL
  Filled 2014-10-23 (×4): qty 1

## 2014-10-23 MED ORDER — CANGRELOR TETRASODIUM 50 MG IV SOLR
INTRAVENOUS | Status: AC
  Filled 2014-10-23: qty 50

## 2014-10-23 MED ORDER — GABAPENTIN 300 MG PO CAPS
300.0000 mg | ORAL_CAPSULE | Freq: Three times a day (TID) | ORAL | Status: DC | PRN
  Filled 2014-10-23: qty 2

## 2014-10-23 MED ORDER — TICAGRELOR 90 MG PO TABS
90.0000 mg | ORAL_TABLET | Freq: Two times a day (BID) | ORAL | Status: DC
  Administered 2014-10-23 – 2014-10-26 (×6): 90 mg via ORAL
  Filled 2014-10-23 (×7): qty 1

## 2014-10-23 MED ORDER — NITROGLYCERIN 1 MG/10 ML FOR IR/CATH LAB
INTRA_ARTERIAL | Status: AC
  Filled 2014-10-23: qty 10

## 2014-10-23 MED ORDER — MIDAZOLAM HCL 2 MG/2ML IJ SOLN
INTRAMUSCULAR | Status: AC
  Filled 2014-10-23: qty 2

## 2014-10-23 MED ORDER — NOREPINEPHRINE BITARTRATE 1 MG/ML IV SOLN
0.0000 ug/min | INTRAVENOUS | Status: AC
  Administered 2014-10-23: 2.5 ug/min via INTRAVENOUS
  Filled 2014-10-23: qty 4

## 2014-10-23 MED ORDER — SODIUM CHLORIDE 0.9 % IJ SOLN
10.0000 mL | Freq: Two times a day (BID) | INTRAMUSCULAR | Status: DC
  Administered 2014-10-24 – 2014-10-25 (×3): 10 mL

## 2014-10-23 MED ORDER — CLONAZEPAM 1 MG PO TABS
1.0000 mg | ORAL_TABLET | Freq: Every day | ORAL | Status: DC
  Administered 2014-10-23 – 2014-10-24 (×2): 1 mg via ORAL
  Filled 2014-10-23 (×2): qty 1

## 2014-10-23 MED ORDER — SODIUM CHLORIDE 0.9 % IJ SOLN
10.0000 mL | INTRAMUSCULAR | Status: DC | PRN
  Administered 2014-10-26: 10 mL
  Filled 2014-10-23: qty 40

## 2014-10-23 MED ORDER — VERAPAMIL HCL 2.5 MG/ML IV SOLN
INTRAVENOUS | Status: AC
  Filled 2014-10-23: qty 2

## 2014-10-23 MED ORDER — BIVALIRUDIN 250 MG IV SOLR
INTRAVENOUS | Status: AC
  Filled 2014-10-23: qty 250

## 2014-10-23 MED ORDER — ONDANSETRON HCL 4 MG/2ML IJ SOLN: 4.0000 mg | Freq: Four times a day (QID) | INTRAMUSCULAR | Status: DC | PRN

## 2014-10-23 MED ORDER — ASPIRIN 81 MG PO CHEW
81.0000 mg | CHEWABLE_TABLET | Freq: Every day | ORAL | Status: DC
  Administered 2014-10-24 – 2014-10-26 (×3): 81 mg via ORAL
  Filled 2014-10-23 (×3): qty 1

## 2014-10-23 MED ORDER — OXYCODONE-ACETAMINOPHEN 10-325 MG PO TABS: 1.0000 | ORAL_TABLET | Freq: Four times a day (QID) | ORAL | Status: DC | PRN

## 2014-10-23 MED ORDER — SODIUM CHLORIDE 0.9 % IV SOLN: INTRAVENOUS | Status: DC

## 2014-10-23 MED ORDER — ROPINIROLE HCL 1 MG PO TABS
2.0000 mg | ORAL_TABLET | Freq: Every day | ORAL | Status: DC
  Administered 2014-10-23 – 2014-10-25 (×3): 2 mg via ORAL
  Filled 2014-10-23 (×4): qty 2

## 2014-10-23 MED ORDER — HEPARIN SODIUM (PORCINE) 1000 UNIT/ML IJ SOLN
INTRAMUSCULAR | Status: AC
  Filled 2014-10-23: qty 1

## 2014-10-23 MED ORDER — SODIUM CHLORIDE 0.9 % IV SOLN
4.0000 ug/kg/min | INTRAVENOUS | Status: DC
  Administered 2014-10-23: 4 ug/kg/min via INTRAVENOUS
  Filled 2014-10-23 (×2): qty 50

## 2014-10-23 MED ORDER — INFLUENZA VAC SPLIT QUAD 0.5 ML IM SUSY
0.5000 mL | PREFILLED_SYRINGE | INTRAMUSCULAR | Status: AC
  Administered 2014-10-25: 0.5 mL via INTRAMUSCULAR
  Filled 2014-10-23 (×2): qty 0.5

## 2014-10-23 MED ORDER — ATROPINE SULFATE 0.1 MG/ML IJ SOLN
INTRAMUSCULAR | Status: AC
  Filled 2014-10-23: qty 10

## 2014-10-23 MED ORDER — OXYCODONE HCL 5 MG PO TABS
5.0000 mg | ORAL_TABLET | Freq: Four times a day (QID) | ORAL | Status: DC | PRN
  Administered 2014-10-23 – 2014-10-24 (×2): 5 mg via ORAL
  Filled 2014-10-23 (×2): qty 1

## 2014-10-23 MED ORDER — HEPARIN (PORCINE) IN NACL 2-0.9 UNIT/ML-% IJ SOLN
INTRAMUSCULAR | Status: AC
  Filled 2014-10-23: qty 1000

## 2014-10-23 MED ORDER — NITROGLYCERIN 0.4 MG SL SUBL
0.4000 mg | SUBLINGUAL_TABLET | SUBLINGUAL | Status: DC | PRN
  Administered 2014-10-24 (×3): 0.4 mg via SUBLINGUAL
  Filled 2014-10-23 (×3): qty 1

## 2014-10-23 MED ORDER — HEPARIN SODIUM (PORCINE) 5000 UNIT/ML IJ SOLN
5000.0000 [IU] | Freq: Three times a day (TID) | INTRAMUSCULAR | Status: DC
  Administered 2014-10-24 – 2014-10-26 (×7): 5000 [IU] via SUBCUTANEOUS
  Filled 2014-10-23 (×11): qty 1

## 2014-10-23 MED ORDER — FENTANYL CITRATE 0.05 MG/ML IJ SOLN
INTRAMUSCULAR | Status: AC
  Filled 2014-10-23: qty 2

## 2014-10-23 MED ORDER — BIVALIRUDIN 250 MG IV SOLR
0.2500 mg/kg/h | INTRAVENOUS | Status: DC
  Filled 2014-10-23: qty 250

## 2014-10-23 MED ORDER — MORPHINE SULFATE 2 MG/ML IJ SOLN
2.0000 mg | INTRAMUSCULAR | Status: DC | PRN
  Administered 2014-10-23 – 2014-10-24 (×4): 2 mg via INTRAVENOUS
  Filled 2014-10-23 (×4): qty 1

## 2014-10-23 MED ORDER — CETYLPYRIDINIUM CHLORIDE 0.05 % MT LIQD: 7.0000 mL | Freq: Two times a day (BID) | OROMUCOSAL | Status: DC

## 2014-10-23 MED ORDER — ACETAMINOPHEN 325 MG PO TABS: 650.0000 mg | ORAL_TABLET | ORAL | Status: DC | PRN

## 2014-10-23 MED ORDER — SODIUM CHLORIDE 0.9 % IV SOLN: 0.2500 mg/kg/h | INTRAVENOUS | Status: DC

## 2014-10-23 MED ORDER — LIDOCAINE HCL (PF) 1 % IJ SOLN
INTRAMUSCULAR | Status: AC
  Filled 2014-10-23: qty 30

## 2014-10-23 NOTE — Progress Notes (Signed)
Patient experiencing chest pain 4/10. Patient stated it is difficult to describe just that it hurts/aches. Patient stated chest pain prior to hospital arrival "a 40 out of 10". EKG completed and morphine given. Paged MD Claiborne Billings, MD Claiborne Billings reviewed EKG and compared to previous EKG.  Will continue to assess patient status and obtain follow up EKG if needed. Vicie Mutters, RN

## 2014-10-23 NOTE — CV Procedure (Signed)
Cardiac Catheterization Procedure Note  Name: [name] [name] MRN: [id number] DOB: 10-01-1940  Procedure: Left Heart Cath, Selective Coronary Angiography,  PTCA and stenting of the proximal LAD, IABP placement.  Indication: 74 yo WM presents with an anterior STEMI associated with cardiogenic shock.  Procedural Details:  There was some delay in initiating procedure due to lack of IV access and in order to access his Portacath. The right wrist was prepped, draped, and anesthetized with 1% lidocaine. Using the modified Seldinger technique, a 6 French slender sheath was introduced into the right radial artery. 3 mg of verapamil was administered through the sheath, weight-based unfractionated heparin was administered intravenously. Standard Judkins catheters were used for selective coronary angiography and left ventricular pressures. Catheter exchanges were performed over an exchange length guidewire.  PROCEDURAL FINDINGS Hemodynamics: AO 88/44 LV 88/33 mm Hg   Coronary angiography: Coronary dominance: left  Left mainstem: Normal  Left anterior descending (LAD): The LAD is occluded 100% in the proximal vessel. The proximal to mid vessel is calcified.  Left circumflex (LCx): Large dominant vessel. It gives off a large bifurcating OM branch before terminating in the PL and PDA branches. The OM has a 60% stenosis prior to the bifurcation into 2 large OM branches. The more lateral branch has a 90% stenosis in the mid vessel.   Right coronary artery (RCA): The RCA is a nondominant vessel. There is a long segment of 80% stenosis in the proximal to mid vessel with a focal 80-90% stenosis in the mid vessel.  Left ventriculography: Not done due to elevated EDP.  PCI Note:  Following the diagnostic procedure, the decision was made to proceed with PCI of the LAD.  Weight-based bivalirudin was given for anticoagulation. The patient was initiated on IV Cangrelor. Once reperfusion was obtained he  was loaded with po Brilinta. Once a therapeutic ACT was achieved, a 6 [place] XBLAD 3.5  guide catheter was inserted.  A prowater coronary guidewire was used to cross the lesion.  The lesion was predilated with a 2.5 mm balloon.  The lesion was then stented with a 3.5 x 32 mm Synergy  stent.  The stent was postdilated with a 3.75 mm noncompliant balloon.  Following PCI, there was 0% residual stenosis and TIMI-3 flow. Final angiography confirmed an excellent result.  During the procedure the patient demonstrated persistent cardiogenic shock with hypotension and elevated LVEDP. He was placed on  IV levophed. The right groin was prepped and draped in a sterile fashion. The right femoral artery was accessed using a modified Seldinger technique. An IABP was placed and positioned distal the there left subclavian artery. With IABP and IV levophed his BP did improve. he did have a lot of ventricular ectopy with reperfusion but this improved by the end of procedure.  The patient tolerated the procedure well. There were no immediate procedural complications. A TR band was used for radial hemostasis. The patient was transferred to the post catheterization recovery area for further monitoring.  PCI Data: Vessel - LAD/Segment - proximal Percent Stenosis (pre)  100% TIMI-flow 0 Stent 3.5 x 32 mm Synergy stent Percent Stenosis (post) 0% TIMI-flow (post) 3  Final Conclusions:   1. Severe 3 vessel obstructive CAD. Culprit lesion is proximal LAD occlusion 2. Cardiogenic shock 3. Successful stenting of the proximal LAD with a DES.  4. IABP placement.    Recommendations:  DAPT for at least one year. Will resume IV heparin approximately 2 hours after radial band removed if no  bleeding. Continue hemodynamic support with IABP and IV pressors. Check Echo for LV function.  Breahna Boylen [place], [place] 10/23/2014, 12:06 PM

## 2014-10-23 NOTE — H&P (Signed)
History and Physical  Patient ID: [name] [name] MRN: [id number], SOB: [birth year] 74 y.o. Date of Encounter: 10/23/2014, 1:23 PM  Primary Physician: Alonza Bogus, MD Primary Cardiologist: Dr. Debara Pickett  Chief Complaint: Chest pain  HPI: 74 y.o. male w/ PMHx significant for HTN, HLD, h/o Mantle Cell Lymphoma s/p chemotherapy (completed 2-3 months ago), PVD, OSA on CPAP qhs, CAD (remote h/o catheterization), and tobacco abuse, presented to [facility] on 10/23/2014 with complaints of chest pain. Patient states he started having chest pain about 2 months ago intermittently, worsened w/ movement and bending over. The patient recently saw Dr. Debara Pickett who had suggested a stress test, however, the patient declined. This AM, patient started having severe left-sided chest pain, crushing in nature, associated w/ diaphoresis, nausea, SOB, dizziness, and lightheadedness. The patient took ASA 325 and 4 NTG (not at the same time). Patient's family called EMS, EKG showed junctional rhythm, w/ anterior ST elevations. Activated Code STEMI and patient was transported emergently to the cath lab. Patient quite hypotensive initially.    Past Medical History  Diagnosis Date  . Hypertension   . Hyperlipidemia   . Rhinitis   . Peripheral edema   . Osteoarthritis   . Obesity     morbid  . CAD (coronary artery disease)     s/p angioplasty  . ED (erectile dysfunction)   . OSA (obstructive sleep apnea)     severe, on CPAP 12  . Restless leg syndrome   . Insomnia   . Anxiety   . Bilateral lower extremity edema   . Tobacco abuse   . Cancer     Mantle cell lymphoma     Surgical History:  Past Surgical History  Procedure Laterality Date  . Total knee arthroplasty      left  . Hemorrhoid surgery    . Back surgery  10/2009  . Eye surgery      lazer bil  . Coronary angioplasty    . Total knee arthroplasty  08/04/2012    right knee  . Total knee arthroplasty  08/04/2012    Procedure: TOTAL  KNEE ARTHROPLASTY;  Surgeon: Yvette Rack., MD;  Location: [place];  Service: Orthopedics;  Laterality: Right;  WITH PATELLA RESURFACING  . Lymph node biopsy Right 01/2014    groin area  . Portacath placement Right 2015     Home Meds: Prior to Admission medications   Medication Sig Start Date End Date Taking? Authorizing Provider  acetaminophen (TYLENOL) 325 MG tablet Take 650 mg by mouth every 4 (four) hours as needed. Take 2 prior to rituxan    Historical Provider, MD  celecoxib (CELEBREX) 200 MG capsule Take 200 mg by mouth 2 (two) times daily as needed for moderate pain.     Historical Provider, MD  clonazePAM (KLONOPIN) 1 MG tablet Take 1-2 tablets (1-2 mg total) by mouth at bedtime. 09/17/14   Baird Cancer, PA-C  diphenhydrAMINE (BENADRYL) 25 MG tablet Take 25 mg by mouth every 6 (six) hours as needed. Take 2 prior to rituxan    Historical Provider, MD  gabapentin (NEURONTIN) 300 MG capsule Take 300-600 mg by mouth 3 (three) times daily as needed (for pain). Take 2 capsules in the morning and 1 capsule upto twice a day    Historical Provider, MD  lidocaine-prilocaine (EMLA) cream Apply a quarter size amount to port site 1 hour prior to chemo. Do not rub in. Cover with plastic wrap. 06/07/14   Farrel Gobble,  MD  loratadine-pseudoephedrine (CLARITIN-D 24 HOUR) 10-240 MG per 24 hr tablet Take 1 tablet by mouth daily. 08/23/14   Farrel Gobble, MD  Multiple Vitamin (MULTIVITAMIN WITH MINERALS) TABS tablet Take 2 tablets by mouth daily.    Historical Provider, MD  NITROSTAT 0.4 MG SL tablet Place 0.4 mg under the tongue every 5 (five) minutes as needed.  08/20/14   Historical Provider, MD  oxyCODONE-acetaminophen (PERCOCET) 10-325 MG per tablet Take 1 tablet by mouth. "I usually take three or four tablets daily" 07/31/14   Historical Provider, MD  rOPINIRole (REQUIP) 1 MG tablet TAKE 2 TABLETS BY MOUTH AT BEDTIME 05/24/14   Chesley Mires, MD  tiZANidine (ZANAFLEX) 4 MG tablet Take 1 tablet by  mouth every 8 (eight) hours as needed for muscle spasms.  04/13/13   Historical Provider, MD  VOLTAREN 1 % GEL Apply topically as needed.  07/19/14   Historical Provider, MD    Allergies: No Known Allergies  History   Social History  . Marital Status: Widowed    Spouse Name: N/A  . Number of Children: 2  . Years of Education: N/A   Occupational History  . Disabled, Retired Dealer   . RETIRED    Social History Main Topics  . Smoking status: Current Every Day Smoker -- 1.00 packs/day for 55 years    Types: Cigarettes  . Smokeless tobacco: Never Used     Comment: <1 ppd  . Alcohol Use: No  . Drug Use: No  . Sexual Activity: Not on file   Other Topics Concern  . Not on file   Social History Narrative     Family History  Problem Relation Age of Onset  . Coronary artery disease Neg Hx     Review of Systems  General: Positive for diaphoresis. Denies fever, appetite change, and fatigue.  Respiratory: Positive for SOB. Denies cough and wheezing.   Cardiovascular: Positive for chest pain. Denies palpitations.  Gastrointestinal: Positive for nausea. Denies vomiting, abdominal pain, and diarrhea Musculoskeletal: Denies myalgias, arthralgias, back pain, and gait problem.  Neurological: Positive for dizziness, lightheadedness. Denies syncope, weakness, and headaches.  Psychiatric/Behavioral: Denies mood changes, sleep disturbance, and agitation.  Physical Exam: Filed Vitals:   10/23/14 1315  BP: 115/78  Pulse: 93  Resp: 26    General: Overweight white male, alert, cooperative, NAD. Multiple tattoos.  HEENT: PERRL, EOMI. Moist mucus membranes. Neck: Full range of motion without pain, supple, no lymphadenopathy or carotid bruits. Lungs: Clear to ascultation bilaterally, normal work of respiration, no wheezes, rales, rhonchi. Heart: RRR, no murmurs, gallops, or rubs. Abdomen: Soft, non-tender, non-distended, BS +. Extremities: No clubbing. LE's w/ mild venous stasis  changes, +1 pitting edema bilaterally.  Neurologic: Alert & oriented x3, cranial nerves II-XII intact, strength grossly intact, sensation intact to light touch.   Labs:   Lab Results  Component Value Date   WBC 5.8 10/23/2014   HGB 14.5 10/23/2014   HCT 42.0 10/23/2014   MCV 96.8 10/23/2014   PLT 158 10/23/2014     Recent Labs Lab 10/23/14 1105  NA 137  K 3.9  CL 105  CO2 24  BUN 14  CREATININE 1.18  CALCIUM 8.7  PROT 6.4  BILITOT 0.8  ALKPHOS 71  ALT 14  AST 15  GLUCOSE 143*    Recent Labs  10/23/14 1105  CKTOTAL 43  CKMB 1.2  TROPONINI <0.03   Lab Results  Component Value Date   CHOL 145 10/23/2014   HDL 27* 10/23/2014  LDLCALC 94 10/23/2014   TRIG 122 10/23/2014      EKG: Junctional bradycardia, ST elevations V1-V3.  CARDIAC STUDIES:  Procedure: Left Heart Cath, Selective Coronary Angiography, PTCA and stenting of the proximal LAD, IABP placement.  Indication: 74 yo WM presents with an anterior STEMI associated with cardiogenic shock.  Procedural Details: There was some delay in initiating procedure due to lack of IV access and in order to access his Portacath. The right wrist was prepped, draped, and anesthetized with 1% lidocaine. Using the modified Seldinger technique, a 6 French slender sheath was introduced into the right radial artery. 3 mg of verapamil was administered through the sheath, weight-based unfractionated heparin was administered intravenously. Standard Judkins catheters were used for selective coronary angiography and left ventricular pressures. Catheter exchanges were performed over an exchange length guidewire.  PROCEDURAL FINDINGS Hemodynamics: AO 88/44 LV 88/33 mm Hg  Coronary angiography: Coronary dominance: left  Left mainstem: Normal  Left anterior descending (LAD): The LAD is occluded 100% in the proximal vessel. The proximal to mid vessel is calcified.  Left circumflex (LCx): Large dominant vessel. It  gives off a large bifurcating OM branch before terminating in the PL and PDA branches. The OM has a 60% stenosis prior to the bifurcation into 2 large OM branches. The more lateral branch has a 90% stenosis in the mid vessel.   Right coronary artery (RCA): The RCA is a nondominant vessel. There is a long segment of 80% stenosis in the proximal to mid vessel with a focal 80-90% stenosis in the mid vessel.  Left ventriculography: Not done due to elevated EDP.  PCI Note: Following the diagnostic procedure, the decision was made to proceed with PCI of the LAD. Weight-based bivalirudin was given for anticoagulation. The patient was initiated on IV Cangrelor. Once reperfusion was obtained he was loaded with po Brilinta. Once a therapeutic ACT was achieved, a 6 [place] XBLAD 3.5 guide catheter was inserted. A prowater coronary guidewire was used to cross the lesion. The lesion was predilated with a 2.5 mm balloon. The lesion was then stented with a 3.5 x 32 mm Synergy stent. The stent was postdilated with a 3.75 mm noncompliant balloon. Following PCI, there was 0% residual stenosis and TIMI-3 flow. Final angiography confirmed an excellent result.  During the procedure the patient demonstrated persistent cardiogenic shock with hypotension and elevated LVEDP. He was placed on IV levophed. The right groin was prepped and draped in a sterile fashion. The right femoral artery was accessed using a modified Seldinger technique. An IABP was placed and positioned distal the there left subclavian artery. With IABP and IV levophed his BP did improve. he did have a lot of ventricular ectopy with reperfusion but this improved by the end of procedure.  The patient tolerated the procedure well. There were no immediate procedural complications. A TR band was used for radial hemostasis. The patient was transferred to the post catheterization recovery area for further monitoring.  PCI Data: Vessel - LAD/Segment -  proximal Percent Stenosis (pre) 100% TIMI-flow 0 Stent 3.5 x 32 mm Synergy stent Percent Stenosis (post) 0% TIMI-flow (post) 3  Final Conclusions:  1. Severe 3 vessel obstructive CAD. Culprit lesion is proximal LAD occlusion 2. Cardiogenic shock 3. Successful stenting of the proximal LAD with a DES.  4. IABP placement.   Recommendations:  DAPT for at least one year. Will resume IV heparin approximately 2 hours after radial band removed if no bleeding. Continue hemodynamic support with IABP and IV  pressors. Check Echo for LV function.   ASSESSMENT AND PLAN:   74 y.o. male w/ PMHx significant for HTN, HLD, h/o Mantle Cell Lymphoma s/p chemotherapy (completed 2-3 months ago), PVD, OSA on CPAP qhs, CAD (remote h/o stent placement), and tobacco abuse, admitted for STEMI.   STEMI: Patient w/ acute onset chest pain, brought in as Code STEMI by EMS. EKG significant for anterior ST elevations. Presented severely hypotensive required Levophed during emergent cardiac catheterization. Cath showed total proximal LAD occlusion. Received DES to LAD w/ good flow s/p PCI. Cath also significant for chronic disease in LCx and OM branches. Patient also w/ poor systolic function, intra-aortic balloon pump placed.  -Admit to CCU -Continue Heparin gtt while IABP in place -Continue Levophed gtt -Continue ASA + Brilinta -Lipitor 80 mg qhs -Hold BB for now given hypotension/cardiogenic shock -ECHO in AM -Continue to cycle troponins  Cardiogenic Shock: Patient w/ severe hypotension d/t total occlusion of LAD described above. Required Levophed gtt during cath. IABP placed in cath lab. Pressures imprved at this time, Levophed still running.  -Continue Levophed for now; taper off as pressure tolerates.  -IABP in place; likely to be removed in AM -Heparin gtt while IABP in place -HOLD antihypertensives, BB for now, may resume in AM -ECHO  HTN: Hypotensive on admission as above. No HTN medications on home  med list.  -Levophed as above -Hold antihypertensives  HLD: Lipid panel pending -Statin as above  BPH: Stable. -Hold Flomax 0.4 mg daily for now    Signed, Luanne Bras MD PGY-2 Internal Medicine Pager: [phone] 10/23/2014, 1:23 PM Patient seen and examined and history reviewed. Agree with above findings and plan. Unfortunate 74 yo WM with prior history of PTCA over 10 yrs ago. Recent chest pain over the past 2 months. Seen by Dr. Debara Pickett in early January but patient deferred further work- states his pain was getting better. Now presents with an anterior STEMI with cardiogenic shock. Reperfusion as noted above with persistent shock requiring Levophed and IABP. Will check Echo in am to assess LV function. Resume IV heparin while IABP in. DAPT. He may be a candidate for PCI of the OM. I would not recommend PCI of the RCA since it is diffusely diseased and nondominant.   Peter Martinique, [place] 10/23/2014 1:31 PM

## 2014-10-23 NOTE — Care Management Note (Addendum)
    Page 1 of 1   10/23/2014     1:57:57 PM CARE MANAGEMENT NOTE 10/23/2014  Patient:  [name] [name], [name] [name]   Account Number:  [id number]  Date Initiated:  10/23/2014  Documentation initiated by:  Elissa Hefty  Subjective/Objective Assessment:   adm w mi     Action/Plan:   lives alone, pcp dr ed Luan Pulling   Anticipated DC Date:     Anticipated DC Plan:  [place]  CM consult  Medication Assistance      Choice offered to / List presented to:             Status of service:   Medicare Important Message given?   (If response is "NO", the following Medicare IM given date fields will be blank) Date Medicare IM given:   Medicare IM given by:   Date Additional Medicare IM given:   Additional Medicare IM given by:    Discharge Disposition:    Per UR Regulation:  Reviewed for med. necessity/level of care/duration of stay  If discussed at [place] of Stay Meetings, dates discussed:    Comments:  2/24 1357 debbie Yannick Steuber rn,bsn gave pt 30day free brilinta card.

## 2014-10-23 NOTE — Progress Notes (Signed)
Site area: RFA IABP catheter and sheath Site Prior to Removal:  Level 0 Pressure Applied For:55minManual:    Patient Status During Pull:  stable Post Pull Site:  Level 0 Post Pull Instructions Given:  yes Post Pull Pulses Present: palpable Dressing Applied:  clear Bedrest begins @ 1900 Comments:

## 2014-10-23 NOTE — Progress Notes (Addendum)
CRITICAL VALUE ALERT  Critical value received:  Trop > 80  Date of notification:  10/23/2014  Time of notification:  6579  Critical value read back:Yes.    Nurse who received alert:  Deberah Castle   MD notified (1st page):  Ignacia Bayley, NP  Time of first page:  1855  MD notified (2nd page):  Time of second page:  Responding MD:  Ignacia Bayley, NP  Time MD responded:  [phone]

## 2014-10-23 NOTE — Progress Notes (Signed)
ANTICOAGULATION CONSULT NOTE - Initial Consult  Pharmacy Consult for Heparin  Indication: s/p cath with IABP   No Known Allergies  Patient Measurements: Height: 5\' 11"  (180.3 cm) Weight: 299 lb 13.2 oz (136 kg) IBW/kg (Calculated) : 75.3 Heparin Dosing Weight: 100.5 kg   Vital Signs: BP: 115/78 mmHg (02/24 1315) Pulse Rate: 93 (02/24 1315)  Labs:  Recent Labs  10/23/14 1105  HGB 14.5  HCT 42.0  PLT 158  APTT 29  LABPROT 13.8  INR 1.05  CREATININE 1.18  CKTOTAL 43  CKMB 1.2  TROPONINI <0.03    Estimated Creatinine Clearance: 79.7 mL/min (by C-G formula based on Cr of 1.18).   Medical History: Past Medical History  Diagnosis Date  . Hypertension   . Hyperlipidemia   . Rhinitis   . Peripheral edema   . Osteoarthritis   . Obesity     morbid  . CAD (coronary artery disease)     s/p angioplasty  . ED (erectile dysfunction)   . OSA (obstructive sleep apnea)     severe, on CPAP 12  . Restless leg syndrome   . Insomnia   . Anxiety   . Bilateral lower extremity edema   . Tobacco abuse   . Cancer     Mantle cell lymphoma    Medications:  Prescriptions prior to admission  Medication Sig Dispense Refill Last Dose  . acetaminophen (TYLENOL) 325 MG tablet Take 650 mg by mouth every 4 (four) hours as needed. Take 2 prior to rituxan   Taking  . clonazePAM (KLONOPIN) 1 MG tablet Take 1-2 tablets (1-2 mg total) by mouth at bedtime. 60 tablet 2   . gabapentin (NEURONTIN) 300 MG capsule Take 300-600 mg by mouth 3 (three) times daily as needed (for pain). Take 2 capsules in the morning and 1 capsule upto twice a day   Taking  . lidocaine-prilocaine (EMLA) cream Apply a quarter size amount to port site 1 hour prior to chemo. Do not rub in. Cover with plastic wrap. 30 g 3 Taking  . loratadine-pseudoephedrine (CLARITIN-D 24 HOUR) 10-240 MG per 24 hr tablet Take 1 tablet by mouth daily. 30 tablet 0 Taking  . Multiple Vitamin (MULTIVITAMIN WITH MINERALS) TABS tablet Take 2  tablets by mouth daily.   Taking  . NITROSTAT 0.4 MG SL tablet Place 0.4 mg under the tongue every 5 (five) minutes as needed.    Taking  . oxyCODONE-acetaminophen (PERCOCET) 10-325 MG per tablet Take 1 tablet by mouth. "I usually take three or four tablets daily"  0 Taking  . rOPINIRole (REQUIP) 1 MG tablet TAKE 2 TABLETS BY MOUTH AT BEDTIME 60 tablet 0 Taking  . VOLTAREN 1 % GEL Apply topically as needed.   1 Taking  . [DISCONTINUED] celecoxib (CELEBREX) 200 MG capsule Take 200 mg by mouth 2 (two) times daily as needed for moderate pain.    Taking  . [DISCONTINUED] diphenhydrAMINE (BENADRYL) 25 MG tablet Take 25 mg by mouth every 6 (six) hours as needed. Take 2 prior to rituxan   Taking  . [DISCONTINUED] tiZANidine (ZANAFLEX) 4 MG tablet Take 1 tablet by mouth every 8 (eight) hours as needed for muscle spasms.    Taking    Assessment: 74 yo male here with STEMI/cardiogenic shock and s/p cath with stent placed to LAD. He has a IABP in place and to begin heparin 4 hours post TR band removal. Currently on cangrelor infusion which is to stop at 1400 per RN. TR band deflation will  start ~ 1400. CBC wnl   Goal of Therapy:  Heparin level 0.2-0.5 units/ml Monitor platelets by anticoagulation protocol: Yes   Plan:  -Begin heparin at 1000 units/hr (~ 10 units/kg/hr) four hours after TR band removal per MD. NO BOLUS  -Heparin level in 8 hrs and daily with CBC daily -Monitor closely for s/s of bleeding   Albertina Parr, PharmD., BCPS Clinical Pharmacist Pager [phone]

## 2014-10-23 NOTE — Progress Notes (Signed)
Orthopedic Tech Progress Note Patient Details:  [name] [name] Apr 25, 1941 [id number]  Ortho Devices Type of Ortho Device: Knee Immobilizer Ortho Device/Splint Location: RLE Ortho Device/Splint Interventions: Ordered, Application   Braulio Bosch 10/23/2014, 7:13 PM

## 2014-10-23 NOTE — Progress Notes (Signed)
Right radial band removed, site level 0 and radial pulses +2 bilaterally.  Gauze and tegaderm dressing applied.  Patient verbalized instructions for arm movement restrictions.  Will continue to monitor. [name] [name], Ardeth Sportsman

## 2014-10-24 ENCOUNTER — Encounter (HOSPITAL_COMMUNITY): Payer: Self-pay | Admitting: Cardiology

## 2014-10-24 ENCOUNTER — Inpatient Hospital Stay (HOSPITAL_COMMUNITY): Payer: Medicare Other

## 2014-10-24 DIAGNOSIS — I2510 Atherosclerotic heart disease of native coronary artery without angina pectoris: Secondary | ICD-10-CM

## 2014-10-24 DIAGNOSIS — I255 Ischemic cardiomyopathy: Secondary | ICD-10-CM

## 2014-10-24 HISTORY — DX: Ischemic cardiomyopathy: I25.5

## 2014-10-24 LAB — BASIC METABOLIC PANEL
Anion gap: 8 (ref 5–15)
BUN: 12 mg/dL (ref 6–23)
CALCIUM: 8.7 mg/dL (ref 8.4–10.5)
CHLORIDE: 105 mmol/L (ref 96–112)
CO2: 23 mmol/L (ref 19–32)
Creatinine, Ser: 0.85 mg/dL (ref 0.50–1.35)
GFR calc Af Amer: >90 mL/min (ref >90)
GFR, EST NON AFRICAN AMERICAN: 85 mL/min — AB (ref >90)
Glucose, Bld: 126 mg/dL — ABNORMAL HIGH (ref 70–99)
Potassium: 4 mmol/L (ref 3.5–5.1)
SODIUM: 136 mmol/L (ref 135–145)

## 2014-10-24 LAB — CBC
HCT: 38.5 % — ABNORMAL LOW (ref 39.0–52.0)
Hemoglobin: 13.2 g/dL (ref 13.0–17.0)
MCH: 32.4 pg (ref 26.0–34.0)
MCHC: 34.3 g/dL (ref 30.0–36.0)
MCV: 94.6 fL (ref 78.0–100.0)
PLATELETS: 151 10*3/uL (ref 150–400)
RBC: 4.07 MIL/uL — AB (ref 4.22–5.81)
RDW: 13.1 % (ref 11.5–15.5)
WBC: 7.9 10*3/uL (ref 4.0–10.5)

## 2014-10-24 LAB — TROPONIN I
TROPONIN I: 52.63 ng/mL — AB (ref <0.031)
Troponin I: >80 ng/mL (ref <0.031)
Troponin I: >80 ng/mL (ref <0.031)

## 2014-10-24 LAB — HEMOGLOBIN A1C
HEMOGLOBIN A1C: 5.2 % (ref 4.8–5.6)
Mean Plasma Glucose: 103 mg/dL

## 2014-10-24 LAB — MAGNESIUM: Magnesium: 1.7 mg/dL (ref 1.5–2.5)

## 2014-10-24 MED ORDER — HEART ATTACK BOUNCING BOOK
Freq: Once | Status: AC
  Administered 2014-10-24: 10:00:00
  Filled 2014-10-24: qty 1

## 2014-10-24 MED ORDER — HYDROMORPHONE HCL 1 MG/ML IJ SOLN
0.5000 mg | Freq: Once | INTRAMUSCULAR | Status: AC
Start: 2014-10-24 — End: 2014-10-24
  Administered 2014-10-24: 0.5 mg via INTRAVENOUS

## 2014-10-24 MED ORDER — CLONAZEPAM 1 MG PO TABS
1.0000 mg | ORAL_TABLET | Freq: Every evening | ORAL | Status: DC | PRN
Start: 2014-10-24 — End: 2014-10-24

## 2014-10-24 MED ORDER — HYDROMORPHONE HCL 1 MG/ML IJ SOLN: 0.5000 mg | INTRAMUSCULAR | Status: DC | PRN

## 2014-10-24 MED ORDER — NICOTINE 14 MG/24HR TD PT24
14.0000 mg | MEDICATED_PATCH | Freq: Every day | TRANSDERMAL | Status: DC
  Administered 2014-10-24 – 2014-10-26 (×3): 14 mg via TRANSDERMAL
  Filled 2014-10-24 (×3): qty 1

## 2014-10-24 MED ORDER — PANTOPRAZOLE SODIUM 40 MG PO TBEC
40.0000 mg | DELAYED_RELEASE_TABLET | Freq: Every day | ORAL | Status: DC
  Administered 2014-10-24 – 2014-10-26 (×3): 40 mg via ORAL
  Filled 2014-10-24 (×3): qty 1

## 2014-10-24 MED ORDER — CARVEDILOL 3.125 MG PO TABS
3.1250 mg | ORAL_TABLET | Freq: Two times a day (BID) | ORAL | Status: DC
  Administered 2014-10-24 – 2014-10-26 (×4): 3.125 mg via ORAL
  Filled 2014-10-24 (×7): qty 1

## 2014-10-24 MED ORDER — DICLOFENAC SODIUM 1 % TD GEL
4.0000 g | Freq: Four times a day (QID) | TRANSDERMAL | Status: DC | PRN
  Administered 2014-10-24 – 2014-10-25 (×3): 4 g via TOPICAL
  Filled 2014-10-24: qty 100

## 2014-10-24 MED ORDER — HYDROMORPHONE HCL 1 MG/ML IJ SOLN
INTRAMUSCULAR | Status: AC
  Administered 2014-10-24: 0.5 mg via INTRAVENOUS
  Filled 2014-10-24: qty 1

## 2014-10-24 MED ORDER — PERFLUTREN LIPID MICROSPHERE
1.0000 mL | INTRAVENOUS | Status: AC | PRN
  Filled 2014-10-24: qty 10

## 2014-10-24 MED ORDER — PERFLUTREN LIPID MICROSPHERE
INTRAVENOUS | Status: AC
  Administered 2014-10-24: 2 mL
  Filled 2014-10-24: qty 10

## 2014-10-24 MED ORDER — LISINOPRIL 2.5 MG PO TABS
2.5000 mg | ORAL_TABLET | Freq: Every day | ORAL | Status: DC
  Administered 2014-10-24 – 2014-10-25 (×2): 2.5 mg via ORAL
  Filled 2014-10-24 (×3): qty 1

## 2014-10-24 MED FILL — Sodium Chloride IV Soln 0.9%: INTRAVENOUS | Qty: 50 | Status: AC

## 2014-10-24 NOTE — Progress Notes (Signed)
  Echocardiogram 2D Echocardiogram with Definity has been performed.  [name] [name] 10/24/2014, 12:40 PM

## 2014-10-24 NOTE — Progress Notes (Signed)
Progress Note  Subjective:    Continued chest pain overnight, improved this AM. BP stable.   Objective:   Temp:  [97.6 F (36.4 C)-98.4 F (36.9 C)] 98.4 F (36.9 C) (02/25 0400) Pulse Rate:  [38-93] 76 (02/25 0700) Resp:  [17-32] 22 (02/25 0700) BP: (89-131)/(33-81) 131/51 mmHg (02/25 0700) SpO2:  [92 %-100 %] 98 % (02/25 0700) Weight:  [299 lb 13.2 oz (136 kg)] 299 lb 13.2 oz (136 kg) (02/24 1100) Last BM Date: 10/22/14  Filed Weights   10/23/14 1100  Weight: 299 lb 13.2 oz (136 kg)    Intake/Output Summary (Last 24 hours) at 10/24/14 0746 Last data filed at 10/24/14 0700  Gross per 24 hour  Intake 1582.77 ml  Output   2225 ml  Net -642.23 ml    Telemetry: NSR, PVC's  Physical Exam: General: Overweight white male, alert, cooperative, NAD. Multiple tattoos.  HEENT: PERRL, EOMI. Moist mucus membranes. Neck: Full range of motion without pain, supple, no lymphadenopathy or carotid bruits. Right chest port.  Lungs: Clear to ascultation bilaterally, normal work of respiration, no wheezes, rales, rhonchi. Heart: RRR, no murmurs, gallops, or rubs. Abdomen: Soft, non-tender, non-distended, BS +. Extremities: No clubbing. LE's w/ mild venous stasis changes, +1 pitting edema bilaterally. Right radial access site w/out bleeding. Pulses intact. Right femoral access site w/out obvious hematoma. Bruising present near site.  Neurologic: Alert & oriented x3, cranial nerves II-XII intact, strength grossly intact, sensation intact to light touch.   Lab Results:  Basic Metabolic Panel:  Recent Labs Lab 10/23/14 1105 10/23/14 1111 10/24/14 0445  NA 137 139 136  K 3.9 4.0 4.0  CL 105 104 105  CO2 24  --  23  GLUCOSE 143* 143* 126*  BUN 14 15 12   CREATININE 1.18 1.10 0.85  CALCIUM 8.7  --  8.7  MG  --   --  1.7    Liver Function Tests:  Recent Labs Lab 10/23/14 1105  AST 15  ALT 14  ALKPHOS 71  BILITOT 0.8  PROT 6.4  ALBUMIN 3.9    CBC:  Recent  Labs Lab 10/23/14 1105 10/23/14 1111 10/24/14 0445  WBC 5.8  --  7.9  HGB 14.5 15.3 13.2  HCT 42.0 45.0 38.5*  MCV 96.8  --  94.6  PLT 158  --  151    Cardiac Enzymes:  Recent Labs Lab 10/23/14 1105 10/23/14 1700 10/23/14 2300 10/24/14 0445  CKTOTAL 43  --   --   --   CKMB 1.2  --   --   --   TROPONINI <0.03 >80.00* >80.00* >80.00*    Coagulation:  Recent Labs Lab 10/23/14 1105  INR 1.05    ECG: NSR, PVC's, septal Q-waves   Medications:   Scheduled Medications: . antiseptic oral rinse  7 mL Mouth Rinse BID  . aspirin  81 mg Oral Daily  . atorvastatin  80 mg Oral q1800  . clonazePAM  1 mg Oral QHS  . heparin subcutaneous  5,000 Units Subcutaneous 3 times per day  . Influenza vac split quadrivalent PF  0.5 mL Intramuscular Tomorrow-1000  . multivitamin with minerals  2 tablet Oral Daily  . rOPINIRole  2 mg Oral QHS  . sodium chloride  10-40 mL Intracatheter Q12H  . ticagrelor  90 mg Oral BID     Infusions: . sodium chloride 10 mL/hr at 10/24/14 0700     PRN Medications:  acetaminophen, gabapentin, HYDROmorphone (DILAUDID) injection, morphine injection, nitroGLYCERIN, ondansetron (ZOFRAN)  IV, oxyCODONE-acetaminophen **AND** oxyCODONE, sodium chloride   Assessment and Plan:  74 y.o. male w/ PMHx significant for HTN, HLD, h/o Mantle Cell Lymphoma s/p chemotherapy (completed 2-3 months ago), PVD, OSA on CPAP qhs, CAD (remote h/o stent placement), and tobacco abuse, admitted for STEMI.   STEMI: Patient w/ acute onset chest pain, brought in as Code STEMI by EMS. EKG significant for anterior ST elevations on admission. Cath showed total proximal LAD occlusion, received DES to LAD w/ good flow s/p PCI. Cath also significant for chronic disease in LCx and OM branches.  -Continue ASA + Brilinta -Lipitor 80 mg qhs -ECHO today -Start BB when BP able to tolerate  Cardiogenic Shock: Resolved. Patient w/ severe hypotension d/t total occlusion of LAD described  above. Required Levophed gtt during cath. IABP placed in cath lab. Pressures improved, no longer requiring pressors, IABP removed last night.  -BB when BP tolerates -ECHO as above  HTN: Hypotensive on admission as above. BP stable at this time.  -Hold antihypertensives for now  HLD: Lipid panel w/ HDL of 27, LDL 94, triglycerides 122.  -Statin as above  BPH: Stable. -Hold Flomax 0.4 mg daily for now   Natasha Bence, MD PGY-2 Internal Medicine Pager: [phone] Patient seen and examined and history reviewed. Agree with above findings and plan. Feels well now. He had recurrent chest pain last night relieved with IV Dilaudid. BP improved. Off pressors and IABP removed yesterday PM. With chest pain Ecg showed no acute changes. ST elevation resolved post PCI but Ecg shows evidence of recent anterior MI. Will start PPI, low dose Coreg, and low dose ACEi today. Check Echo and CXR. He has frequent ventricular bigeminy on monitor. Will watch post reperfusion and on beta blocker. Discussed smoking cessation and diet. Will keep in ICU today.  Lota Leamer [place], [place] 10/24/2014 10:11 AM

## 2014-10-24 NOTE — Progress Notes (Signed)
Patient unable to sleep due to chest pain, stated chest pain 6/10.  Gave patient 3 sublingual nitroglycerin. Patient rated chest pain 3/10.  Repeat EKG done and MD Claiborne Billings called. New orders received. Vicie Mutters, RN

## 2014-10-24 NOTE — Progress Notes (Signed)
CARDIAC REHAB PHASE I   PRE:  Rate/Rhythm: 84 SR  BP:  Supine:   Sitting: 112/57  Standing:    SaO2: 94%RA  MODE:  Ambulation: 270 ft   POST:  Rate/Rhythm: 102 ST  BP:  Supine:   Sitting: 116/67  Standing:    SaO2: 95%RA 1335-1406 Pt walked 270 ft on RA with steady gait. Breathes heavily which is normal for him. He stated has CP was about 2 before walking and stayed 2. Did not worsen with activity. Also having foot pain which again did not give worse or better with walk. Wants daughter here for ed. Will follow up tomorrow. In recliner with call bell.   Graylon Good, RN BSN  10/24/2014 2:02 PM

## 2014-10-24 NOTE — Progress Notes (Signed)
Came to ambulate but pt is having echo. Will f/u later. Yves Dill CES, ACSM 11:46 AM 10/24/2014

## 2014-10-24 NOTE — Progress Notes (Signed)
0.5 mg Dilaudid given, patient rated chest pain 1 out of 10.  MD Claiborne Billings reviewed EKG and discussed pain with patient.  If pain reoccurs, MD Claiborne Billings advised RN to give dose of 0.5 mg dilaudid and perform repeat EKG. Vicie Mutters, RN

## 2014-10-24 NOTE — Progress Notes (Signed)
Patient refuses CPAP at this time. He did not tolerate the mask last night and wants to have some one from home bring his tomorrow. RT will continue to monitor.

## 2014-10-25 DIAGNOSIS — G4733 Obstructive sleep apnea (adult) (pediatric): Secondary | ICD-10-CM | POA: Diagnosis not present

## 2014-10-25 DIAGNOSIS — Z6841 Body Mass Index (BMI) 40.0 and over, adult: Secondary | ICD-10-CM | POA: Diagnosis not present

## 2014-10-25 DIAGNOSIS — I8390 Asymptomatic varicose veins of unspecified lower extremity: Secondary | ICD-10-CM | POA: Diagnosis not present

## 2014-10-25 DIAGNOSIS — E785 Hyperlipidemia, unspecified: Secondary | ICD-10-CM | POA: Diagnosis not present

## 2014-10-25 DIAGNOSIS — I10 Essential (primary) hypertension: Secondary | ICD-10-CM | POA: Diagnosis not present

## 2014-10-25 DIAGNOSIS — Z8572 Personal history of non-Hodgkin lymphomas: Secondary | ICD-10-CM | POA: Diagnosis not present

## 2014-10-25 DIAGNOSIS — R570 Cardiogenic shock: Secondary | ICD-10-CM | POA: Diagnosis not present

## 2014-10-25 DIAGNOSIS — M1990 Unspecified osteoarthritis, unspecified site: Secondary | ICD-10-CM | POA: Diagnosis not present

## 2014-10-25 DIAGNOSIS — I2109 ST elevation (STEMI) myocardial infarction involving other coronary artery of anterior wall: Secondary | ICD-10-CM | POA: Diagnosis not present

## 2014-10-25 DIAGNOSIS — F419 Anxiety disorder, unspecified: Secondary | ICD-10-CM | POA: Diagnosis not present

## 2014-10-25 DIAGNOSIS — Z7982 Long term (current) use of aspirin: Secondary | ICD-10-CM | POA: Diagnosis not present

## 2014-10-25 DIAGNOSIS — N400 Enlarged prostate without lower urinary tract symptoms: Secondary | ICD-10-CM | POA: Diagnosis not present

## 2014-10-25 DIAGNOSIS — Z79899 Other long term (current) drug therapy: Secondary | ICD-10-CM | POA: Diagnosis not present

## 2014-10-25 DIAGNOSIS — Z96653 Presence of artificial knee joint, bilateral: Secondary | ICD-10-CM | POA: Diagnosis not present

## 2014-10-25 DIAGNOSIS — I739 Peripheral vascular disease, unspecified: Secondary | ICD-10-CM | POA: Diagnosis not present

## 2014-10-25 DIAGNOSIS — G2581 Restless legs syndrome: Secondary | ICD-10-CM | POA: Diagnosis not present

## 2014-10-25 DIAGNOSIS — Z79891 Long term (current) use of opiate analgesic: Secondary | ICD-10-CM | POA: Diagnosis not present

## 2014-10-25 DIAGNOSIS — F17210 Nicotine dependence, cigarettes, uncomplicated: Secondary | ICD-10-CM | POA: Diagnosis not present

## 2014-10-25 LAB — CBC
HEMATOCRIT: 37.7 % — AB (ref 39.0–52.0)
HEMOGLOBIN: 13 g/dL (ref 13.0–17.0)
MCH: 32.7 pg (ref 26.0–34.0)
MCHC: 34.5 g/dL (ref 30.0–36.0)
MCV: 94.7 fL (ref 78.0–100.0)
Platelets: 133 10*3/uL — ABNORMAL LOW (ref 150–400)
RBC: 3.98 MIL/uL — ABNORMAL LOW (ref 4.22–5.81)
RDW: 13.2 % (ref 11.5–15.5)
WBC: 6 10*3/uL (ref 4.0–10.5)

## 2014-10-25 MED ORDER — CLONAZEPAM 1 MG PO TABS
2.0000 mg | ORAL_TABLET | Freq: Every day | ORAL | Status: DC
  Administered 2014-10-25: 2 mg via ORAL
  Filled 2014-10-25: qty 2

## 2014-10-25 MED ORDER — TEMAZEPAM 15 MG PO CAPS
30.0000 mg | ORAL_CAPSULE | Freq: Every day | ORAL | Status: DC
Start: 2014-10-25 — End: 2014-10-26
  Administered 2014-10-25: 30 mg via ORAL
  Filled 2014-10-25: qty 2

## 2014-10-25 NOTE — Progress Notes (Signed)
Pt has tried our CPAP and states that our mask is very uncomfortable. Family was supposed to bring his home unit in today and did not do so. Pt states that he is ok and does not wish to try our machine again. Pt understands that he can call if he changes his mind. RT will monitor.

## 2014-10-25 NOTE — Progress Notes (Signed)
Progress Note  Subjective:    No complaints this AM. Denies chest pain or SOB, no dizziness or lightheadedness. BP somewhat soft overnight, this AM, asymptomatic.   Objective:   Temp:  [98.1 F (36.7 C)-99.2 F (37.3 C)] 98.7 F (37.1 C) (02/26 0400) Pulse Rate:  [71-86] 71 (02/26 0400) Resp:  [16-29] 22 (02/26 0400) BP: (85-126)/(46-74) 88/53 mmHg (02/26 0400) SpO2:  [92 %-100 %] 92 % (02/26 0400) Last BM Date: 10/24/14  Filed Weights   10/23/14 1100  Weight: 299 lb 13.2 oz (136 kg)    Intake/Output Summary (Last 24 hours) at 10/25/14 8299 Last data filed at 10/25/14 0600  Gross per 24 hour  Intake    610 ml  Output    600 ml  Net     10 ml    Telemetry: NSR, PVC's  Physical Exam: General: Overweight white male, alert, cooperative, NAD. Multiple tattoos.  HEENT: PERRL, EOMI. Moist mucus membranes. Neck: Full range of motion without pain, supple, no lymphadenopathy or carotid bruits. Right chest port.  Lungs: Clear to ascultation bilaterally, normal work of respiration, no wheezes, rales, rhonchi. Heart: RRR, no murmurs, gallops, or rubs. Abdomen: Soft, non-tender, non-distended, BS +. Extremities: No clubbing. LE's w/ mild venous stasis changes, +1 pitting edema bilaterally. Pulses intact. Right femoral access site w/out obvious hematoma. Bruising present near site.  Neurologic: Alert & oriented x3, cranial nerves II-XII intact, strength grossly intact, sensation intact to light touch.   Lab Results:  Basic Metabolic Panel:  Recent Labs Lab 10/23/14 1105 10/23/14 1111 10/24/14 0445  NA 137 139 136  K 3.9 4.0 4.0  CL 105 104 105  CO2 24  --  23  GLUCOSE 143* 143* 126*  BUN 14 15 12   CREATININE 1.18 1.10 0.85  CALCIUM 8.7  --  8.7  MG  --   --  1.7    Liver Function Tests:  Recent Labs Lab 10/23/14 1105  AST 15  ALT 14  ALKPHOS 71  BILITOT 0.8  PROT 6.4  ALBUMIN 3.9    CBC:  Recent Labs Lab 10/23/14 1105 10/23/14 1111  10/24/14 0445 10/25/14 0257  WBC 5.8  --  7.9 6.0  HGB 14.5 15.3 13.2 13.0  HCT 42.0 45.0 38.5* 37.7*  MCV 96.8  --  94.6 94.7  PLT 158  --  151 133*    Cardiac Enzymes:  Recent Labs Lab 10/23/14 1105  10/23/14 2300 10/24/14 0445 10/24/14 1100  CKTOTAL 43  --   --   --   --   CKMB 1.2  --   --   --   --   TROPONINI <0.03  < > >80.00* >80.00* 52.63*  < > = values in this interval not displayed.  Coagulation:  Recent Labs Lab 10/23/14 1105  INR 1.05    ECG: NSR, PVC's, septal Q-waves   Medications:   Scheduled Medications: . aspirin  81 mg Oral Daily  . atorvastatin  80 mg Oral q1800  . carvedilol  3.125 mg Oral BID WC  . clonazePAM  1 mg Oral QHS  . heparin subcutaneous  5,000 Units Subcutaneous 3 times per day  . Influenza vac split quadrivalent PF  0.5 mL Intramuscular Tomorrow-1000  . lisinopril  2.5 mg Oral Daily  . multivitamin with minerals  2 tablet Oral Daily  . nicotine  14 mg Transdermal Daily  . pantoprazole  40 mg Oral Daily  . rOPINIRole  2 mg Oral QHS  . sodium  chloride  10-40 mL Intracatheter Q12H  . ticagrelor  90 mg Oral BID    Infusions: . sodium chloride Stopped (10/24/14 0800)    PRN Medications: acetaminophen, diclofenac sodium, gabapentin, HYDROmorphone (DILAUDID) injection, morphine injection, nitroGLYCERIN, ondansetron (ZOFRAN) IV, oxyCODONE-acetaminophen **AND** oxyCODONE, sodium chloride   Assessment and Plan:  74 y.o. male w/ PMHx significant for HTN, HLD, h/o Mantle Cell Lymphoma s/p chemotherapy (completed 2-3 months ago), PVD, OSA on CPAP qhs, CAD (remote h/o stent placement), and tobacco abuse, admitted for STEMI.   STEMI: Presented on 10/23/14 w/ severe chest pain. EKG significant for anterior ST elevations on admission. Cath showed total proximal LAD occlusion, received DES to LAD w/ good flow s/p PCI. Cath also significant for chronic disease in LCx and OM branches. ECHO performed yesterday showed EF of 40-45%, moderate  focal basal hypertrophy of the septum. Also significant for akinesis of the apicalanteroseptal and apical myocardium. Grade 1 diastolic dysfunction. BP soft overnight, SBP in the 80's. Ambulating well this AM w/ no complaints, asymptomatic.  -Continue ASA + Brilinta -Lipitor 80 mg qhs -Continue Coreg 3.125 mg bid -Lisinopril 2.5 mg daily  ICM: ECHO results as above. Had some hypotension overnight but doing well this AM.  -Coreg, Lisinopril as above  HLD: Lipid panel w/ HDL of 27, LDL 94, triglycerides 122.  -Statin as above  BPH: Stable. -Hold Flomax 0.4 mg daily for now   Natasha Bence, MD PGY-2 Internal Medicine Pager: [phone]   Patient seen and examined and history reviewed. Agree with above findings and plan. He is feeling very well. No chest pain or SOB. Monitor shows PVCs with some couplets. Echo looked better than anticipated. EF 40-45%. BP soft so cannot titrate ACEi or Coreg further. Will transfer to telemetry today. Ambulate. Possible DC tomorrow if stable. Follow up with Dr. Debara Pickett as outpatient. Would repeat Echo in 3 months. Continue DAPT for one year. Smoking cessation advised.   Kealey Kemmer [place], [place] 10/25/2014 9:12 AM

## 2014-10-25 NOTE — Progress Notes (Signed)
CARDIAC REHAB PHASE I   PRE:  Rate/Rhythm: 86 SR  BP:  Supine:   Sitting: 99/69  Standing:    SaO2:   MODE:  Ambulation: 350 ft   POST:  Rate/Rhythm: 98 SR PVCs  BP:  Supine:   Sitting: 116/52  Standing:    SaO2:  1350-1435 Pt walked 350 ft on RA with steady gait. Tolerated well. No CP. MI education completed with pt who voiced understanding. Pt stated he could not quit cold [place] but would try to cut down gradually to nothing. Offered fake cigarette but did not want. Gave smoking cessation handouts. Reviewed importance of brilinta with stent, NTG use, ex ed and heart healthy diet. Not interested in Phase 2 CRP as he stated too busy.   Graylon Good, RN BSN  10/25/2014 2:30 PM

## 2014-10-26 ENCOUNTER — Encounter (HOSPITAL_COMMUNITY): Payer: Self-pay | Admitting: Cardiology

## 2014-10-26 DIAGNOSIS — I255 Ischemic cardiomyopathy: Secondary | ICD-10-CM | POA: Diagnosis present

## 2014-10-26 DIAGNOSIS — I959 Hypotension, unspecified: Secondary | ICD-10-CM | POA: Diagnosis present

## 2014-10-26 DIAGNOSIS — E785 Hyperlipidemia, unspecified: Secondary | ICD-10-CM | POA: Diagnosis present

## 2014-10-26 LAB — CBC
HEMATOCRIT: 39.8 % (ref 39.0–52.0)
Hemoglobin: 13.9 g/dL (ref 13.0–17.0)
MCH: 33.7 pg (ref 26.0–34.0)
MCHC: 34.9 g/dL (ref 30.0–36.0)
MCV: 96.4 fL (ref 78.0–100.0)
Platelets: 136 10*3/uL — ABNORMAL LOW (ref 150–400)
RBC: 4.13 MIL/uL — ABNORMAL LOW (ref 4.22–5.81)
RDW: 13.1 % (ref 11.5–15.5)
WBC: 6.2 10*3/uL (ref 4.0–10.5)

## 2014-10-26 MED ORDER — HEPARIN SOD (PORK) LOCK FLUSH 100 UNIT/ML IV SOLN
500.0000 [IU] | INTRAVENOUS | Status: AC | PRN
  Administered 2014-10-26: 500 [IU]

## 2014-10-26 MED ORDER — TICAGRELOR 90 MG PO TABS: 90.0000 mg | ORAL_TABLET | Freq: Two times a day (BID) | ORAL | Status: DC

## 2014-10-26 MED ORDER — ASPIRIN 81 MG PO CHEW: 81.0000 mg | CHEWABLE_TABLET | Freq: Every day | ORAL | Status: DC

## 2014-10-26 MED ORDER — DICLOFENAC SODIUM 1 % TD GEL: 4.0000 g | Freq: Four times a day (QID) | TRANSDERMAL | Status: AC | PRN

## 2014-10-26 MED ORDER — CARVEDILOL 3.125 MG PO TABS: 3.1250 mg | ORAL_TABLET | Freq: Two times a day (BID) | ORAL | Status: DC

## 2014-10-26 MED ORDER — ATORVASTATIN CALCIUM 80 MG PO TABS: 80.0000 mg | ORAL_TABLET | Freq: Every day | ORAL | Status: DC

## 2014-10-26 MED ORDER — PANTOPRAZOLE SODIUM 40 MG PO TBEC: 40.0000 mg | DELAYED_RELEASE_TABLET | Freq: Every day | ORAL | Status: DC

## 2014-10-26 MED ORDER — LISINOPRIL 2.5 MG PO TABS: 2.5000 mg | ORAL_TABLET | Freq: Every day | ORAL | Status: DC

## 2014-10-26 MED ORDER — NICOTINE 14 MG/24HR TD PT24: 14.0000 mg | MEDICATED_PATCH | Freq: Every day | TRANSDERMAL | Status: DC

## 2014-10-26 NOTE — Discharge Instructions (Signed)
Myocardial Infarction °A myocardial infarction (MI) is also called a heart attack. It causes damage to the heart that cannot be fixed. An MI often happens when a blood clot or other blockage cuts blood flow to the heart. When this happens, certain areas of the heart begin to die. This is an emergency. °HOME CARE °· Take medicine as told by your doctor. °· Change certain behaviors as told by your doctor. This may include: °¨ Quitting smoking. °¨ Being active. °¨ Keeping a healthy weight. °¨ Eating a heart-healthy diet. Ask your doctor for help with this diet. °¨ Keeping your diabetes under control. °¨ Lessening stress. °¨ Limiting how much alcohol you drink. °GET HELP RIGHT AWAY IF: °· You have crushing or pressure-like chest pain that spreads to the arms, back, neck, or jaw. Call your local emergency services (911 in [place] Do not drive yourself to the hospital. °· You have severe chest pain. °· You have shortness of breath during rest, sleep, or with activity. °· You have sudden sweating or clammy skin. °· You feel sick to your stomach (nauseous) and throw up (vomit). °· You suddenly get lightheaded or dizzy. °· You feel your heart beating fast or skipping beats. °MAKE SURE YOU:  °· Understand these instructions. °· Will watch your condition. °· Will get help right away if you are not doing well or get worse. °Document Released: 02/15/2012 Document Reviewed: 10/19/2013 °ExitCare® Patient Information ©2015 [employer]. This information is not intended to replace advice given to you by your health care provider. Make sure you discuss any questions you have with your health care provider. °Coronary Angiogram With Stent, Care After °Refer to this sheet in the next few weeks. These instructions provide you with information on caring for yourself after your procedure. Your health care provider may also give you more specific instructions. Your treatment has been planned according to current medical practices, but  problems sometimes occur. Call your health care provider if you have any problems or questions after your procedure.  °WHAT TO EXPECT AFTER THE PROCEDURE  °The insertion site may be tender for a few days after your procedure. °HOME CARE INSTRUCTIONS  °· Take medicines only as directed by your health care provider. Blood thinners may be prescribed after your procedure to improve blood flow through the stent. °· Change any bandages (dressings) as directed by your health care provider.   °· Check your insertion site every day for redness, swelling, or fluid leaking from the insertion.   °· Do not take baths, swim, or use a hot tub until your health care provider approves. You may shower. Pat the insertion area dry. Do not rub the insertion area with a washcloth or towel.   °· Eat a heart-healthy diet. This should include plenty of fresh fruits and vegetables. Meat should be lean cuts. Avoid the following types of food:   °¨ Food that is high in salt.   °¨ Canned or highly processed food.   °¨ Food that is high in saturated fat or sugar.   °¨ Fried food.   °· Make any other lifestyle changes recommended by your health care provider. This may include:   °¨ Not using any tobacco products including cigarettes, chewing tobacco, or electronic cigarettes.  °¨ Managing your weight.   °¨ Getting regular exercise.   °¨ Managing your blood pressure.   °¨ Limiting your alcohol intake.   °¨ Managing other health problems, such as diabetes.   °· If you need an MRI after your heart stent was placed, be sure to tell the health care provider   who orders the MRI that you have a heart stent.   °· Keep all follow-up visits as directed by your health care provider.   °SEEK IMMEDIATE MEDICAL CARE IF:  °· You develop chest pain, shortness of breath, feel faint, or pass out. °· You have bleeding, swelling larger than a walnut, or drainage from the catheter insertion site. °· You develop pain, discoloration, coldness, or severe bruising in the  leg or arm that held the catheter. °· You develop bleeding from any other place such as from the bowels. There may be bright red blood in the urine or stools, or it may appear as black, tarry stools. °· You have a fever or chills. °MAKE SURE YOU: °· Understand these instructions. °· Will watch your condition. °· Will get help right away if you are not doing well or get worse. °Document Released: 03/05/2005 Document Revised: 12/31/2013 Document Reviewed: 01/17/2013 °ExitCare® Patient Information ©2015 [employer]. This information is not intended to replace advice given to you by your health care provider. Make sure you discuss any questions you have with your health care provider. ° °

## 2014-10-26 NOTE — Progress Notes (Signed)
CARDIAC REHAB PHASE I   PRE:  Rate/Rhythm: 81 sr  BP:  Sitting: 91/51      SaO2: 94% ra  MODE:  Ambulation: 500 ft   POST:  Rate/Rhythm: 90 sr  BP:  Sitting: 100/63     SaO2: 97% ra  9:48am-10:06am Patient stated that he has already walked x2 this morning.  Patient ambulated at a slow and steady pace.  Patient had no complaints. Placed in chair with call bell in reach.  Nykira Reddix, [place], [place] 10/26/2014 10:05 AM

## 2014-10-26 NOTE — Discharge Summary (Signed)
Patient ID: [name] [name],  MRN: [id number], DOB/AGE: Oct 15, 1940 74 y.o.  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 male w/ PMHx significant for HTN, HLD, h/o Mantle Cell Lymphoma s/p chemotherapy (completed 2-3 months ago), PVD, OSA on CPAP qhs, CAD (remote h/o catheterization and ? PTCA), and tobacco abuse, presented to [facility] on 10/23/2014 with complaints of chest pain. Patient states he started having chest pain about 2 months ago intermittently, worsened w/ movement and bending over. The patient recently saw Dr. Debara Pickett in Jan 2016 who had suggested a stress test, however, the patient declined. The morning of admission the patient started having severe left-sided chest pain, crushing in nature, associated w/ diaphoresis, nausea, SOB, dizziness, and lightheadedness. The patient took ASA 325 and 4 NTG (not at the same time). Patient's family called EMS, EKG showed junctional rhythm, w/ anterior ST elevations. Activated Code STEMI and patient was transported emergently to the cath lab. Patient quite hypotensive initially. Cath done by Dr Martinique revealed total LAD occlusion. This was treated with a DES. The procedure was complicated by cardiogenic shock requiring an IABP and IV Levophed. The pt did have residual CAD-  The LCx was a large dominant vessel. It gave off a large bifurcating OM branch before terminating in the PL and PDA branches. The OM has a 60% stenosis prior to the bifurcation into 2 large OM branches. The more lateral branch has a 90% stenosis in the mid vessel. The RCA was a nondominant vessel. There was a long segment of 80% stenosis in the proximal to mid vessel with a focal 80-90% stenosis in the mid vessel.  The pt was admitted to the CCU post op and was stable. His pressure remained low throughout his hospitalization, restricting medical Rx. Echo done post MI revealed an EF of 40-45%. The pt was seen by Dr Harrington Challenger the morning of the 27 th and felt to be stable for discharge.   Discharge Vitals:  Blood pressure 95/48, pulse 86, temperature 98.7 F (37.1 C), temperature source Oral, resp. rate 18, height 5\' 11"  (1.803 m), weight 299 lb 13.2 oz (136 kg), SpO2 94 %.    Labs: Results for orders placed or performed during the hospital encounter of 10/23/14 (from the past 24 hour(s))  CBC     Status: Abnormal   Collection Time: 10/26/14  3:24 AM  Result Value Ref Range   WBC 6.2 4.0 - 10.5 K/uL   RBC 4.13 (L) 4.22 - 5.81 MIL/uL   Hemoglobin 13.9 13.0 - 17.0 g/dL   HCT 39.8 39.0 - 52.0 %   MCV 96.4 78.0 - 100.0 fL   MCH 33.7 26.0 - 34.0 pg   MCHC 34.9 30.0 - 36.0 g/dL   RDW 13.1 11.5 - 15.5 %  Platelets 136 (L) 150 - 400 K/uL    Disposition:      Follow-up Information    Follow up with Pixie Casino, MD.   Specialty:  Cardiology   Why:  office will call you   Contact information:   [place] [address] [phone]       Discharge Medications:    Medication List    STOP taking these medications        aspirin 325 MG tablet  Replaced by:  aspirin 81 MG chewable tablet     celecoxib 200 MG capsule  Commonly known as:  CELEBREX     loratadine-pseudoephedrine 10-240 MG per 24 hr tablet  Commonly known as:  CLARITIN-D 24 HOUR     ROZEREM 8  MG tablet  Generic drug:  ramelteon      TAKE these medications        acetaminophen 325 MG tablet  Commonly known as:  TYLENOL  Take 650 mg by mouth every 4 (four) hours as needed. Take 2 prior to rituxan     aspirin 81 MG chewable tablet  Chew 1 tablet (81 mg total) by mouth daily.     atorvastatin 80 MG tablet  Commonly known as:  LIPITOR  Take 1 tablet (80 mg total) by mouth daily at 6 PM.     carvedilol 3.125 MG tablet  Commonly known as:  COREG  Take 1 tablet (3.125 mg total) by mouth 2 (two) times daily with a meal.     clonazePAM 1 MG tablet  Commonly known as:  KLONOPIN  Take 1-2 tablets (1-2 mg total) by mouth at bedtime.     diclofenac sodium 1 % Gel  Commonly known as:  VOLTAREN  Apply 4 g topically 4 (four) times daily as needed (for leg soreness).     gabapentin 300 MG capsule  Commonly known as:  NEURONTIN  Take 300-600 mg by mouth 3 (three) times daily as needed (for pain). Take 2 capsules in the morning and 1 capsule upto twice a day     HYDROcodone-acetaminophen 10-325 MG per tablet  Commonly known as:  NORCO  Take by mouth 4 (four) times daily as needed. For pain     lidocaine-prilocaine cream  Commonly known as:  EMLA  Apply a quarter size amount to port site 1 hour prior to chemo. Do not rub in. Cover with plastic wrap.     lisinopril 2.5 MG tablet  Commonly known as:  PRINIVIL,ZESTRIL  Take 1 tablet (2.5 mg total) by mouth at bedtime.     multivitamin with minerals Tabs tablet  Take 2 tablets by mouth daily.     nicotine 14 mg/24hr patch  Commonly known as:  NICODERM CQ - dosed in mg/24 hours  Place 1 patch (14 mg total) onto the skin daily.     NITROSTAT 0.4 MG SL tablet  Generic drug:  nitroGLYCERIN  Place 0.4 mg under the tongue every 5 (five) minutes as needed.     pantoprazole 40 MG tablet  Commonly known as:  PROTONIX  Take 1 tablet (40 mg total) by mouth daily.     rOPINIRole 1 MG tablet  Commonly known as:  REQUIP  TAKE 2  TABLETS BY MOUTH AT BEDTIME     tamsulosin 0.4 MG Caps capsule  Commonly known as:  FLOMAX  Take 0.4 mg by mouth daily.     temazepam 30 MG capsule  Commonly known as:  RESTORIL  Take  30 mg by mouth at bedtime.     ticagrelor 90 MG Tabs tablet  Commonly known as:  BRILINTA  Take 1 tablet (90 mg total) by mouth 2 (two) times daily.     tiZANidine 4 MG tablet  Commonly known as:  ZANAFLEX  Take 4-8 mg by mouth 2 (two) times daily as needed.         Duration of Discharge Encounter: Greater than 30 minutes including physician time.  Angelena Form PA-C 10/26/2014 10:48 AM

## 2014-10-26 NOTE — Progress Notes (Signed)
   Subjective: No CP  NO SOB No dizziness Objective: Filed Vitals:   10/25/14 1505 10/25/14 2053 10/26/14 0509 10/26/14 0943  BP: 94/58 92/44 95/58  95/48  Pulse: 83 78 81 86  Temp: 98.1 F (36.7 C) 99.8 F (37.7 C) 98.7 F (37.1 C)   TempSrc: Oral Oral Oral   Resp: 20 18 18    Height:      Weight:      SpO2: 96% 100% 94%    Weight change:   Intake/Output Summary (Last 24 hours) at 10/26/14 1008 Last data filed at 10/26/14 0857  Gross per 24 hour  Intake    440 ml  Output      0 ml  Net    440 ml    General: Alert, awake, oriented x3, in no acute distress Neck:  JVP is normal Heart: Regular rate and rhythm, without murmurs, rubs, gallops.  Lungs: Clear to auscultation.  No rales or wheezes. Exemities:  No edema.   Neuro: Grossly intact, nonfocal.  Tele:  SR   Lab Results: Results for orders placed or performed during the hospital encounter of 10/23/14 (from the past 24 hour(s))  CBC     Status: Abnormal   Collection Time: 10/26/14  3:24 AM  Result Value Ref Range   WBC 6.2 4.0 - 10.5 K/uL   RBC 4.13 (L) 4.22 - 5.81 MIL/uL   Hemoglobin 13.9 13.0 - 17.0 g/dL   HCT 39.8 39.0 - 52.0 %   MCV 96.4 78.0 - 100.0 fL   MCH 33.7 26.0 - 34.0 pg   MCHC 34.9 30.0 - 36.0 g/dL   RDW 13.1 11.5 - 15.5 %   Platelets 136 (L) 150 - 400 K/uL    Studies/Results: No results found.  Medications: reviewed    @PROBHOSP @  1  CAD  S/p STEMI  LVEF 40 to 45%  DES to LAD  Will d/c today with f/u with C Hilty    HL  COntinue lipitor 80 mg     LOS: 3 days   Dorris Carnes 10/26/2014, 10:08 AM

## 2014-10-26 NOTE — Progress Notes (Signed)
Pt discharge home with daughter. Discharge instructions reviewed with pt, pt vu. Encouraged to call with needs.

## 2014-11-01 DIAGNOSIS — G4733 Obstructive sleep apnea (adult) (pediatric): Secondary | ICD-10-CM | POA: Diagnosis not present

## 2014-11-12 DIAGNOSIS — M5441 Lumbago with sciatica, right side: Secondary | ICD-10-CM | POA: Diagnosis not present

## 2014-11-13 ENCOUNTER — Encounter: Payer: Self-pay | Admitting: Internal Medicine

## 2014-11-13 ENCOUNTER — Ambulatory Visit (INDEPENDENT_AMBULATORY_CARE_PROVIDER_SITE_OTHER): Payer: Medicare Other | Admitting: Internal Medicine

## 2014-11-13 VITALS — BP 144/62 | HR 61 | Ht 69.0 in | Wt 296.3 lb

## 2014-11-13 DIAGNOSIS — R931 Abnormal findings on diagnostic imaging of heart and coronary circulation: Secondary | ICD-10-CM

## 2014-11-13 DIAGNOSIS — R570 Cardiogenic shock: Secondary | ICD-10-CM | POA: Diagnosis not present

## 2014-11-13 DIAGNOSIS — I2510 Atherosclerotic heart disease of native coronary artery without angina pectoris: Secondary | ICD-10-CM | POA: Diagnosis not present

## 2014-11-13 DIAGNOSIS — Z9861 Coronary angioplasty status: Secondary | ICD-10-CM | POA: Diagnosis not present

## 2014-11-13 DIAGNOSIS — I2109 ST elevation (STEMI) myocardial infarction involving other coronary artery of anterior wall: Secondary | ICD-10-CM | POA: Diagnosis not present

## 2014-11-13 DIAGNOSIS — I255 Ischemic cardiomyopathy: Secondary | ICD-10-CM

## 2014-11-13 NOTE — Patient Instructions (Signed)
Your physician has requested that you have an echocardiogram. Echocardiography is a painless test that uses sound waves to create images of your heart. It provides your doctor with information about the size and shape of your heart and how well your heart's chambers and valves are working. This procedure takes approximately one hour. There are no restrictions for this procedure. >> please schedule for June 2016  You have been referred to Cardiac Rehab @ [facility] recommends that you schedule a follow-up appointment in: 3 months with Dr. Debara Pickett

## 2014-11-14 ENCOUNTER — Telehealth (HOSPITAL_COMMUNITY): Payer: Self-pay | Admitting: *Deleted

## 2014-11-14 ENCOUNTER — Other Ambulatory Visit (HOSPITAL_COMMUNITY): Payer: Self-pay | Admitting: Specialist

## 2014-11-14 DIAGNOSIS — M545 Low back pain: Secondary | ICD-10-CM

## 2014-11-14 NOTE — Progress Notes (Signed)
Alonza Bogus, MD [place] [address]  Mantle Cell Lymphoma   CURRENT THERAPY: S/P 5 cycles of Bendamustine/ Rituxan with Neulasta support starting on 02/26/2014 and finishing on 06/19/2014  INTERVAL HISTORY: GODSON POLLAN 74 y.o. 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 He continues on maintenance Rituxan.  He complains today of right lower extremity swelling. He notes that his lower extremity swelling is chronic. He emphasizes today however the right leg has been bothering him much more. He also has been seen in the emergency department. He states he had an episode where he felt like "an elephant was on his chest." He has seen a cardiologist and sees him again in June. He had an MI 3 weeks ago and a stent was placed. He wants to continue forward with his Rituxan therapy.     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 There is also splenomegaly. The combination of findings is highly suspicious for lymphoma   01/29/2014 Initial Diagnosis Lymph node, biopsy, Right Groin- LN - MANTLE CELL LYMPHOMA.   02/06/2014 Cancer Staging Interpretation Peripheral Blood Flow Cytometry - A SMALL MONOCLONAL B-CELL POPULATION IDENTIFIED.   02/26/2014 - 06/19/2014 Chemotherapy Bendamustine/Rituxan x 5 cycles with Neulasta support   05/13/2014 Remission PET- No evidence for residual or recurrent tumor within the neck, chest, abdomen, or pelvis.    09/17/2014 -  Chemotherapy Rituxan maintenance every 60 days x  2 years      Past Medical History  Diagnosis Date  . Hypertension   . Hyperlipidemia   . Rhinitis   . Peripheral edema   .  Osteoarthritis   . Obesity     morbid  . CAD (coronary artery disease) 10/23/14    s/p LAD DES / STEMI with CGS  . ED (erectile dysfunction)   . OSA (obstructive sleep apnea)     severe, on CPAP 12  . Restless leg syndrome   . Insomnia   . Anxiety   . Bilateral lower extremity edema   . Tobacco abuse   . Cancer     Mantle cell lymphoma  . 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     has No Known Allergies.  Mr. Chapple had no medications administered during this visit.  Past Surgical History  Procedure Laterality Date  . Total knee arthroplasty      left  . Hemorrhoid surgery    . Back surgery  10/2009  . Eye surgery      lazer bil  . Coronary angioplasty    . Total knee arthroplasty  08/04/2012    right knee  . Total knee arthroplasty  08/04/2012    Procedure:  TOTAL KNEE ARTHROPLASTY;  Surgeon: Yvette Rack., MD;  Location: [facility];  Service: Orthopedics;  Laterality: Right;  WITH PATELLA RESURFACING  . Lymph node biopsy Right 01/2014    groin area  . Portacath placement Right 2015  . Rotator cuff repair  2011  . Left heart catheterization with coronary angiogram N/A 10/23/2014    Procedure: LEFT HEART CATHETERIZATION WITH CORONARY ANGIOGRAM;  Surgeon: Peter M Martinique, MD;  Location: [facility] CATH LAB;  Service: Cardiovascular;  Laterality: N/A;  . Percutaneous coronary stent intervention (pci-s)  10/23/2014     Procedure: PERCUTANEOUS CORONARY STENT INTERVENTION (PCI-S);  Surgeon: Peter M Martinique, MD;  Location: [facility] CATH LAB;  Service: Cardiovascular;;  . Intra-aortic balloon pump insertion  10/23/2014    Procedure: INTRA-AORTIC BALLOON PUMP INSERTION;  Surgeon: Peter M Martinique, MD;  Location: [facility] CATH LAB;  Service: Cardiovascular;;    Denies any headaches, dizziness, double vision, fevers, chills, night sweats, nausea, vomiting, diarrhea, constipation, chest pain, heart palpitations, shortness of breath, blood in stool, black tarry stool, urinary pain, urinary burning, urinary frequency, hematuria.   PHYSICAL EXAMINATION  ECOG PERFORMANCE STATUS: 1 - Symptomatic but completely ambulatory  Filed Vitals:   11/15/14 0929  BP: 87/53  Pulse: 65  Temp: 98.2 F (36.8 C)  Resp: 22    GENERAL:alert, no distress, well nourished, well developed, comfortable, cooperative, obese and smiling SKIN: skin color, texture, turgor are normal, no rashes or significant lesions HEAD: Normocephalic, No masses, lesions, tenderness or abnormalities EYES: normal, PERRLA, EOMI, Conjunctiva are pink and non-injected EARS: External ears normal OROPHARYNX:mucous membranes are moist  NECK: supple, no adenopathy, thyroid normal size, non-tender, without nodularity, no stridor, non-tender, trachea midline LYMPH:  no palpable lymphadenopathy BREAST:not examined LUNGS: clear to auscultation  HEART: regular rate & rhythm ABDOMEN:abdomen soft, non-tender, obese and normal bowel sounds BACK: Back symmetric, no curvature. EXTREMITIES:  edema B/L LE edema, 2+ pitting RLE slightly larger than left NEURO: alert & oriented x 3 with fluent speech, no focal motor/sensory deficits, gait normal   LABORATORY DATA: CBC    Component Value Date/Time   WBC 6.2 10/26/2014 0324   RBC 4.13* 10/26/2014 0324   RBC 5.00 02/06/2014 1530   HGB 13.9 10/26/2014 0324   HCT 39.8 10/26/2014 0324   PLT 136* 10/26/2014 0324   MCV 96.4 10/26/2014  0324   MCH 33.7 10/26/2014 0324   MCHC 34.9 10/26/2014 0324   RDW 13.1 10/26/2014 0324   LYMPHSABS 0.2* 09/17/2014 0945   MONOABS 0.5 09/17/2014 0945   EOSABS 0.1 09/17/2014 0945   BASOSABS 0.0 09/17/2014 0945      Chemistry      Component Value Date/Time   NA 136 10/24/2014 0445   K 4.0 10/24/2014 0445   CL 105 10/24/2014 0445   CO2 23 10/24/2014 0445   BUN 12 10/24/2014 0445   CREATININE 0.85 10/24/2014 0445      Component Value Date/Time   CALCIUM 8.7 10/24/2014 0445   ALKPHOS 71 10/23/2014 1105   AST 15 10/23/2014 1105   ALT 14 10/23/2014 1105   BILITOT 0.8 10/23/2014 1105      ASSESSMENT AND PLAN:   Mantle cell lymphoma Recent MRI with coronary artery stenting Right lower extremity worsening edema in the setting of chronic lower extremity edema  74 year old male with mantle cell lymphoma currently on maintenance Rituxan. He has just undergone coronary artery stent placement for an MI. I did talk to his cardiologist today, Dr. Claudina Lick. He states the patient is  cleared for Rituxan. Therefore we will proceed. I have advised the patient to notify us if he has any difficulties during his infusion.  In regards to his lower extremity. We will check a right lower extremity ultrasound. He does have chronic edema. The leg is slightly larger than the other side. He also reports increasing discomfort. We will arrange for a lower extremity ultrasound today. We will keep him apprised of the results today prior to his departure from the clinic.  We will schedule him for two-month follow-up and ongoing Rituxan maintenance.  THERAPY PLAN:  Continue maintenance Rituxan every 2 months x 2 years.   All questions were answered. The patient knows to call the clinic with any problems, questions or concerns. We can certainly see the patient much sooner if necessary.  Molli Hazard MD 11/15/2014

## 2014-11-14 NOTE — Telephone Encounter (Signed)
Received unsigned referral for cardiac rehab.  Pt previously declined phase II cardiac rehab while in hospital.  Message left for pt to please contact cardiac rehab.   MD Order sent to Dr. Debara Pickett office to review and sign. Cherre Huger, BSN

## 2014-11-15 ENCOUNTER — Encounter (HOSPITAL_COMMUNITY): Payer: Medicare Other | Attending: Hematology and Oncology

## 2014-11-15 ENCOUNTER — Ambulatory Visit (HOSPITAL_COMMUNITY)
Admission: RE | Admit: 2014-11-15 | Discharge: 2014-11-15 | Disposition: A | Discharge status: Home / Self Care | Payer: Medicare Other | Source: Ambulatory Visit | Attending: Hematology & Oncology | Admitting: Hematology & Oncology

## 2014-11-15 ENCOUNTER — Encounter (HOSPITAL_COMMUNITY): Payer: Self-pay | Admitting: Hematology & Oncology

## 2014-11-15 ENCOUNTER — Telehealth: Payer: Self-pay | Admitting: *Deleted

## 2014-11-15 ENCOUNTER — Encounter (HOSPITAL_BASED_OUTPATIENT_CLINIC_OR_DEPARTMENT_OTHER): Payer: Medicare Other | Admitting: Hematology & Oncology

## 2014-11-15 VITALS — BP 87/53 | HR 65 | Temp 98.2°F | Resp 22 | Wt 296.3 lb

## 2014-11-15 VITALS — BP 117/66 | HR 65 | Temp 98.1°F | Resp 20

## 2014-11-15 DIAGNOSIS — M79604 Pain in right leg: Secondary | ICD-10-CM

## 2014-11-15 DIAGNOSIS — M79661 Pain in right lower leg: Secondary | ICD-10-CM

## 2014-11-15 DIAGNOSIS — M7989 Other specified soft tissue disorders: Secondary | ICD-10-CM

## 2014-11-15 DIAGNOSIS — C8310 Mantle cell lymphoma, unspecified site: Secondary | ICD-10-CM | POA: Insufficient documentation

## 2014-11-15 DIAGNOSIS — Z5112 Encounter for antineoplastic immunotherapy: Secondary | ICD-10-CM | POA: Diagnosis not present

## 2014-11-15 DIAGNOSIS — R600 Localized edema: Secondary | ICD-10-CM | POA: Insufficient documentation

## 2014-11-15 DIAGNOSIS — C8318 Mantle cell lymphoma, lymph nodes of multiple sites: Secondary | ICD-10-CM

## 2014-11-15 LAB — CBC WITH DIFFERENTIAL/PLATELET
BASOS ABS: 0 10*3/uL (ref 0.0–0.1)
BASOS PCT: 1 % (ref 0–1)
EOS PCT: 3 % (ref 0–5)
Eosinophils Absolute: 0.1 10*3/uL (ref 0.0–0.7)
HEMATOCRIT: 39.7 % (ref 39.0–52.0)
HEMOGLOBIN: 13.4 g/dL (ref 13.0–17.0)
LYMPHS PCT: 9 % — AB (ref 12–46)
Lymphs Abs: 0.3 10*3/uL — ABNORMAL LOW (ref 0.7–4.0)
MCH: 32.8 pg (ref 26.0–34.0)
MCHC: 33.8 g/dL (ref 30.0–36.0)
MCV: 97.3 fL (ref 78.0–100.0)
Monocytes Absolute: 0.3 10*3/uL (ref 0.1–1.0)
Monocytes Relative: 9 % (ref 3–12)
NEUTROS ABS: 3.1 10*3/uL (ref 1.7–7.7)
Neutrophils Relative %: 80 % — ABNORMAL HIGH (ref 43–77)
Platelets: 176 10*3/uL (ref 150–400)
RBC: 4.08 MIL/uL — ABNORMAL LOW (ref 4.22–5.81)
RDW: 13.4 % (ref 11.5–15.5)
WBC: 3.9 10*3/uL — ABNORMAL LOW (ref 4.0–10.5)

## 2014-11-15 LAB — COMPREHENSIVE METABOLIC PANEL
ALT: 17 U/L (ref 0–53)
ANION GAP: 9 (ref 5–15)
AST: 17 U/L (ref 0–37)
Albumin: 3.9 g/dL (ref 3.5–5.2)
Alkaline Phosphatase: 69 U/L (ref 39–117)
BILIRUBIN TOTAL: 0.8 mg/dL (ref 0.3–1.2)
BUN: 19 mg/dL (ref 6–23)
CO2: 24 mmol/L (ref 19–32)
CREATININE: 0.98 mg/dL (ref 0.50–1.35)
Calcium: 8.8 mg/dL (ref 8.4–10.5)
Chloride: 105 mmol/L (ref 96–112)
GFR calc Af Amer: >90 mL/min (ref >90)
GFR, EST NON AFRICAN AMERICAN: 80 mL/min — AB (ref >90)
GLUCOSE: 105 mg/dL — AB (ref 70–99)
POTASSIUM: 4.2 mmol/L (ref 3.5–5.1)
Sodium: 138 mmol/L (ref 135–145)
Total Protein: 6.9 g/dL (ref 6.0–8.3)

## 2014-11-15 MED ORDER — SODIUM CHLORIDE 0.9 % IV SOLN
INTRAVENOUS | Status: DC
  Administered 2014-11-15: 13:00:00 via INTRAVENOUS

## 2014-11-15 MED ORDER — DIPHENHYDRAMINE HCL 25 MG PO CAPS
50.0000 mg | ORAL_CAPSULE | Freq: Once | ORAL | Status: AC
  Administered 2014-11-15: 50 mg via ORAL

## 2014-11-15 MED ORDER — HEPARIN SOD (PORK) LOCK FLUSH 100 UNIT/ML IV SOLN
INTRAVENOUS | Status: AC
  Filled 2014-11-15: qty 5

## 2014-11-15 MED ORDER — ACETAMINOPHEN 325 MG PO TABS
650.0000 mg | ORAL_TABLET | Freq: Once | ORAL | Status: AC
  Administered 2014-11-15: 650 mg via ORAL

## 2014-11-15 MED ORDER — HEPARIN SOD (PORK) LOCK FLUSH 100 UNIT/ML IV SOLN
500.0000 [IU] | Freq: Once | INTRAVENOUS | Status: AC
  Administered 2014-11-15: 500 [IU] via INTRAVENOUS

## 2014-11-15 MED ORDER — ACETAMINOPHEN 325 MG PO TABS
ORAL_TABLET | ORAL | Status: AC
  Filled 2014-11-15: qty 2

## 2014-11-15 MED ORDER — SODIUM CHLORIDE 0.9 % IV SOLN
375.0000 mg/m2 | Freq: Once | INTRAVENOUS | Status: AC
  Administered 2014-11-15: 1000 mg via INTRAVENOUS
  Filled 2014-11-15: qty 100

## 2014-11-15 MED ORDER — DIPHENHYDRAMINE HCL 25 MG PO CAPS
ORAL_CAPSULE | ORAL | Status: AC
  Filled 2014-11-15: qty 2

## 2014-11-15 NOTE — Telephone Encounter (Signed)
Faxed orders for phase 2 cardiac rehab to [facility]

## 2014-11-15 NOTE — Progress Notes (Signed)
[name] [name] Tolerated chemotherapy well.  Discharged ambulatory

## 2014-11-15 NOTE — Patient Instructions (Signed)
[facility] Discharge Instructions for Patients Receiving Chemotherapy  Today you received the following chemotherapy agents rituxan Please call the clinic if you have any questions are concerns.  Follow up as scheduled   To help prevent nausea and vomiting after your treatment, we encourage you to take your nausea medication    If you develop nausea and vomiting that is not controlled by your nausea medication, call the clinic. If it is after clinic hours your family physician or the after hours number for the clinic or go to the Emergency Department.   BELOW ARE SYMPTOMS THAT SHOULD BE REPORTED IMMEDIATELY:  *FEVER GREATER THAN 101.0 F  *CHILLS WITH OR WITHOUT FEVER  NAUSEA AND VOMITING THAT IS NOT CONTROLLED WITH YOUR NAUSEA MEDICATION  *UNUSUAL SHORTNESS OF BREATH  *UNUSUAL BRUISING OR BLEEDING  TENDERNESS IN MOUTH AND THROAT WITH OR WITHOUT PRESENCE OF ULCERS  *URINARY PROBLEMS  *BOWEL PROBLEMS  UNUSUAL RASH Items with * indicate a potential emergency and should be followed up as soon as possible.  One of the nurses will contact you 24 hours after your treatment. Please let the nurse know about any problems that you may have experienced. Feel free to call the clinic you have any questions or concerns. The clinic phone number is [phone].   I have been informed and understand all the instructions given to me. I know to contact the clinic, my physician, or go to the Emergency Department if any problems should occur. I do not have any questions at this time, but understand that I may call the clinic during office hours or the Patient Navigator at [phone] should I have any questions or need assistance in obtaining follow up care.
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
## 2014-11-15 NOTE — Patient Instructions (Addendum)
[place] at [facility] Discharge Instructions  RECOMMENDATIONS MADE BY THE CONSULTANT AND ANY TEST RESULTS WILL BE SENT TO YOUR REFERRING PHYSICIAN.  Exam and discussion by Dr. Whitney Muse. Ultrasound of leg does not reveal any clot. MD has talked with your cardiologist and it is ok to proceed with chemotherapy.  Call with any concerns or issues.  Chemotherapy and follow-up in 2 months.    Thank you for choosing [place] at [facility] to provide your oncology and hematology care.  To afford each patient quality time with our provider, please arrive at least 15 minutes before your scheduled appointment time.    You need to re-schedule your appointment should you arrive 10 or more minutes late.  We strive to give you quality time with our providers, and arriving late affects you and other patients whose appointments are after yours.  Also, if you no show three or more times for appointments you may be dismissed from the clinic at the providers discretion.     Again, thank you for choosing [facility].  Our hope is that these requests will decrease the amount of time that you wait before being seen by our physicians.       _____________________________________________________________  Should you have questions after your visit to [facility], please contact our office at [phone] between the hours of 8:30 a.m. and 4:30 p.m.  Voicemails left after 4:30 p.m. will not be returned until the following business day.  For prescription refill requests, have your pharmacy contact our office.

## 2014-11-20 NOTE — Addendum Note (Signed)
Addended by: Chauncy Lean. on: 11/20/2014 09:46 AM   Modules accepted: Orders

## 2014-11-21 ENCOUNTER — Emergency Department (HOSPITAL_COMMUNITY)
Admission: EM | Admit: 2014-11-21 | Discharge: 2014-11-21 | Disposition: A | Discharge status: Home / Self Care | Payer: Medicare Other | Attending: Emergency Medicine | Admitting: Emergency Medicine

## 2014-11-21 ENCOUNTER — Encounter (HOSPITAL_COMMUNITY): Payer: Self-pay | Admitting: Emergency Medicine

## 2014-11-21 DIAGNOSIS — E785 Hyperlipidemia, unspecified: Secondary | ICD-10-CM | POA: Insufficient documentation

## 2014-11-21 DIAGNOSIS — T2641XA Burn of right eye and adnexa, part unspecified, initial encounter: Secondary | ICD-10-CM | POA: Diagnosis not present

## 2014-11-21 DIAGNOSIS — T2632XA Burns of other specified parts of left eye and adnexa, initial encounter: Secondary | ICD-10-CM | POA: Insufficient documentation

## 2014-11-21 DIAGNOSIS — Z720 Tobacco use: Secondary | ICD-10-CM | POA: Diagnosis not present

## 2014-11-21 DIAGNOSIS — Z9861 Coronary angioplasty status: Secondary | ICD-10-CM | POA: Insufficient documentation

## 2014-11-21 DIAGNOSIS — Z8673 Personal history of transient ischemic attack (TIA), and cerebral infarction without residual deficits: Secondary | ICD-10-CM | POA: Insufficient documentation

## 2014-11-21 DIAGNOSIS — Z9889 Other specified postprocedural states: Secondary | ICD-10-CM | POA: Insufficient documentation

## 2014-11-21 DIAGNOSIS — Y998 Other external cause status: Secondary | ICD-10-CM | POA: Insufficient documentation

## 2014-11-21 DIAGNOSIS — F419 Anxiety disorder, unspecified: Secondary | ICD-10-CM | POA: Diagnosis not present

## 2014-11-21 DIAGNOSIS — Z7982 Long term (current) use of aspirin: Secondary | ICD-10-CM | POA: Diagnosis not present

## 2014-11-21 DIAGNOSIS — Z791 Long term (current) use of non-steroidal anti-inflammatories (NSAID): Secondary | ICD-10-CM | POA: Diagnosis not present

## 2014-11-21 DIAGNOSIS — Y9389 Activity, other specified: Secondary | ICD-10-CM | POA: Diagnosis not present

## 2014-11-21 DIAGNOSIS — T2631XA Burns of other specified parts of right eye and adnexa, initial encounter: Secondary | ICD-10-CM | POA: Diagnosis not present

## 2014-11-21 DIAGNOSIS — I10 Essential (primary) hypertension: Secondary | ICD-10-CM | POA: Insufficient documentation

## 2014-11-21 DIAGNOSIS — Z8589 Personal history of malignant neoplasm of other organs and systems: Secondary | ICD-10-CM | POA: Diagnosis not present

## 2014-11-21 DIAGNOSIS — I2510 Atherosclerotic heart disease of native coronary artery without angina pectoris: Secondary | ICD-10-CM | POA: Diagnosis not present

## 2014-11-21 DIAGNOSIS — Z8669 Personal history of other diseases of the nervous system and sense organs: Secondary | ICD-10-CM | POA: Insufficient documentation

## 2014-11-21 DIAGNOSIS — X088XXA Exposure to other specified smoke, fire and flames, initial encounter: Secondary | ICD-10-CM | POA: Insufficient documentation

## 2014-11-21 DIAGNOSIS — Z79899 Other long term (current) drug therapy: Secondary | ICD-10-CM | POA: Diagnosis not present

## 2014-11-21 DIAGNOSIS — M1990 Unspecified osteoarthritis, unspecified site: Secondary | ICD-10-CM | POA: Insufficient documentation

## 2014-11-21 DIAGNOSIS — T2642XA Burn of left eye and adnexa, part unspecified, initial encounter: Secondary | ICD-10-CM | POA: Diagnosis not present

## 2014-11-21 DIAGNOSIS — S0592XA Unspecified injury of left eye and orbit, initial encounter: Secondary | ICD-10-CM | POA: Diagnosis present

## 2014-11-21 DIAGNOSIS — Y9289 Other specified places as the place of occurrence of the external cause: Secondary | ICD-10-CM | POA: Diagnosis not present

## 2014-11-21 MED ORDER — ATROPINE SULFATE 1 % OP SOLN
1.0000 [drp] | Freq: Four times a day (QID) | OPHTHALMIC | Status: DC
  Administered 2014-11-21: 1 [drp] via OPHTHALMIC
  Filled 2014-11-21: qty 2

## 2014-11-21 MED ORDER — FLUORESCEIN SODIUM 1 MG OP STRP
1.0000 | ORAL_STRIP | Freq: Once | OPHTHALMIC | Status: AC
  Administered 2014-11-21: 1 via OPHTHALMIC
  Filled 2014-11-21: qty 1

## 2014-11-21 MED ORDER — TETRACAINE HCL 0.5 % OP SOLN
1.0000 [drp] | Freq: Once | OPHTHALMIC | Status: DC
Start: 2014-11-21 — End: 2014-11-21

## 2014-11-21 MED ORDER — TETRACAINE HCL 0.5 % OP SOLN
1.0000 [drp] | Freq: Once | OPHTHALMIC | Status: AC
  Administered 2014-11-21: 1 [drp] via OPHTHALMIC
  Filled 2014-11-21: qty 2

## 2014-11-21 MED ORDER — FLUORESCEIN SODIUM 1 MG OP STRP: 1.0000 | ORAL_STRIP | Freq: Once | OPHTHALMIC | Status: DC

## 2014-11-21 MED ORDER — KETOROLAC TROMETHAMINE 0.5 % OP SOLN
1.0000 [drp] | OPHTHALMIC | Status: AC
  Administered 2014-11-21: 1 [drp] via OPHTHALMIC
  Filled 2014-11-21: qty 3
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
## 2014-11-21 NOTE — ED Notes (Signed)
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 Has changes in vision since.

## 2014-11-21 NOTE — Discharge Instructions (Signed)
You have been given to eyedrops in the emergency department 1.  Ketorolac is an anti-inflammatory.  Please use this 1 drop to each eye 4 times a day.  2. You've also been given a second bottle of eyedrop called atropine.  This is a dilating drop it will make the pupil larger and less mobile, which will decrease the pain.  You may need to use sunglasses in bright light for the next several days while you use these drops.  I would use both drops on a regular basis for 3 days, then the ketorolac anti-inflammatory drop for another 2 days if you still having eye pain after that point.  Please make an appointment with your ophthalmologist with the ophthalmologist that I've referred you to\ Please use the drops in this order.  The atropine to dilate the pupil first, wait 10 minutes and then put in the ketorolac anti-inflammatory drop.

## 2014-11-21 NOTE — ED Notes (Signed)
Declined W/C at D/C and was escorted to lobby by RN. 

## 2014-11-27 DIAGNOSIS — I2510 Atherosclerotic heart disease of native coronary artery without angina pectoris: Secondary | ICD-10-CM | POA: Diagnosis not present

## 2014-11-27 DIAGNOSIS — J449 Chronic obstructive pulmonary disease, unspecified: Secondary | ICD-10-CM | POA: Diagnosis not present

## 2014-11-27 DIAGNOSIS — M5410 Radiculopathy, site unspecified: Secondary | ICD-10-CM | POA: Diagnosis not present

## 2014-11-27 DIAGNOSIS — M545 Low back pain: Secondary | ICD-10-CM | POA: Diagnosis not present

## 2014-11-28 ENCOUNTER — Other Ambulatory Visit (HOSPITAL_COMMUNITY): Payer: Self-pay | Admitting: Oncology

## 2014-11-28 ENCOUNTER — Telehealth (HOSPITAL_COMMUNITY): Payer: Self-pay

## 2014-11-28 DIAGNOSIS — G4700 Insomnia, unspecified: Secondary | ICD-10-CM

## 2014-11-28 MED ORDER — TEMAZEPAM 30 MG PO CAPS: 30.0000 mg | ORAL_CAPSULE | Freq: Every day | ORAL | Status: DC

## 2014-11-28 NOTE — Telephone Encounter (Signed)
Patient verified that he is taking Klonopin for sleep and  [facility] pharmacy notified to cancel refill for Temazepam.

## 2014-12-02 DIAGNOSIS — M545 Low back pain: Secondary | ICD-10-CM | POA: Diagnosis not present

## 2014-12-03 ENCOUNTER — Other Ambulatory Visit: Payer: Self-pay

## 2014-12-03 ENCOUNTER — Other Ambulatory Visit (HOSPITAL_COMMUNITY): Payer: Self-pay | Admitting: Oncology

## 2014-12-03 ENCOUNTER — Other Ambulatory Visit: Payer: Self-pay | Admitting: *Deleted

## 2014-12-03 DIAGNOSIS — N400 Enlarged prostate without lower urinary tract symptoms: Secondary | ICD-10-CM

## 2014-12-03 MED ORDER — TAMSULOSIN HCL 0.4 MG PO CAPS: 0.4000 mg | ORAL_CAPSULE | Freq: Every day | ORAL | Status: DC

## 2014-12-03 MED ORDER — LISINOPRIL 2.5 MG PO TABS: 2.5000 mg | ORAL_TABLET | Freq: Every day | ORAL | Status: DC

## 2014-12-03 MED ORDER — LISINOPRIL 2.5 MG PO TABS
2.5000 mg | ORAL_TABLET | Freq: Every day | ORAL | Status: DC
Start: 2014-12-03 — End: 2015-10-06

## 2014-12-03 NOTE — Telephone Encounter (Signed)
Rx(s) sent to pharmacy electronically.  

## 2014-12-10 ENCOUNTER — Other Ambulatory Visit (HOSPITAL_COMMUNITY): Payer: Self-pay | Admitting: Hematology and Oncology

## 2014-12-16 ENCOUNTER — Other Ambulatory Visit (HOSPITAL_COMMUNITY): Payer: Self-pay | Admitting: Oncology

## 2014-12-16 DIAGNOSIS — C8318 Mantle cell lymphoma, lymph nodes of multiple sites: Secondary | ICD-10-CM

## 2014-12-16 MED ORDER — CLONAZEPAM 1 MG PO TABS: 2.0000 mg | ORAL_TABLET | Freq: Every day | ORAL | Status: AC

## 2015-01-15 ENCOUNTER — Ambulatory Visit (HOSPITAL_COMMUNITY): Payer: Medicare Other | Admitting: Hematology & Oncology

## 2015-01-15 ENCOUNTER — Inpatient Hospital Stay (HOSPITAL_COMMUNITY): Payer: Medicare Other

## 2015-01-16 NOTE — Progress Notes (Signed)
This encounter was created in error - please disregard.

## 2015-01-21 ENCOUNTER — Encounter (HOSPITAL_COMMUNITY): Payer: Self-pay

## 2015-02-13 DIAGNOSIS — G4733 Obstructive sleep apnea (adult) (pediatric): Secondary | ICD-10-CM | POA: Diagnosis not present

## 2015-02-18 ENCOUNTER — Inpatient Hospital Stay (HOSPITAL_COMMUNITY): Admission: RE | Admit: 2015-02-18 | Payer: Medicare Other | Source: Ambulatory Visit

## 2015-02-18 ENCOUNTER — Ambulatory Visit (HOSPITAL_COMMUNITY): Payer: Medicare Other

## 2015-03-07 ENCOUNTER — Other Ambulatory Visit (HOSPITAL_COMMUNITY): Payer: Self-pay | Admitting: Oncology

## 2015-03-07 DIAGNOSIS — C8318 Mantle cell lymphoma, lymph nodes of multiple sites: Secondary | ICD-10-CM

## 2015-04-16 DIAGNOSIS — J449 Chronic obstructive pulmonary disease, unspecified: Secondary | ICD-10-CM | POA: Diagnosis not present

## 2015-04-16 DIAGNOSIS — C8590 Non-Hodgkin lymphoma, unspecified, unspecified site: Secondary | ICD-10-CM | POA: Diagnosis not present

## 2015-04-16 DIAGNOSIS — M545 Low back pain: Secondary | ICD-10-CM | POA: Diagnosis not present

## 2015-04-16 DIAGNOSIS — I2510 Atherosclerotic heart disease of native coronary artery without angina pectoris: Secondary | ICD-10-CM | POA: Diagnosis not present

## 2015-05-06 ENCOUNTER — Telehealth: Payer: Self-pay | Admitting: Internal Medicine

## 2015-05-06 NOTE — Telephone Encounter (Signed)
Pt called in wanting to know if he could have a steroid shot in his back while he was on the his blood thinner, Brilinta. Please f/u with him  Thanks

## 2015-05-06 NOTE — Telephone Encounter (Signed)
He cannot have a back injection at this time - cannot stop Brillinta until at least 10/24/2015.  Dr. Lemmie Evens

## 2015-05-07 NOTE — Telephone Encounter (Signed)
Patient called and notified of MD recommendations/advice. He voiced understanding.

## 2015-05-19 DIAGNOSIS — G4733 Obstructive sleep apnea (adult) (pediatric): Secondary | ICD-10-CM | POA: Diagnosis not present

## 2015-08-14 ENCOUNTER — Other Ambulatory Visit: Payer: Self-pay | Admitting: Nurse Practitioner

## 2015-08-14 DIAGNOSIS — J449 Chronic obstructive pulmonary disease, unspecified: Secondary | ICD-10-CM | POA: Diagnosis not present

## 2015-08-14 DIAGNOSIS — M545 Low back pain: Secondary | ICD-10-CM | POA: Diagnosis not present

## 2015-08-14 DIAGNOSIS — G4730 Sleep apnea, unspecified: Secondary | ICD-10-CM | POA: Diagnosis not present

## 2015-08-14 DIAGNOSIS — C8590 Non-Hodgkin lymphoma, unspecified, unspecified site: Secondary | ICD-10-CM | POA: Diagnosis not present

## 2015-08-20 DIAGNOSIS — G4733 Obstructive sleep apnea (adult) (pediatric): Secondary | ICD-10-CM | POA: Diagnosis not present

## 2015-08-21 ENCOUNTER — Telehealth: Payer: Self-pay

## 2015-08-27 ENCOUNTER — Other Ambulatory Visit: Payer: Self-pay

## 2015-08-27 ENCOUNTER — Other Ambulatory Visit: Payer: Self-pay | Admitting: Internal Medicine

## 2015-08-27 ENCOUNTER — Ambulatory Visit (HOSPITAL_COMMUNITY): Payer: Medicare Other | Attending: Cardiovascular Disease

## 2015-08-27 DIAGNOSIS — F17200 Nicotine dependence, unspecified, uncomplicated: Secondary | ICD-10-CM | POA: Diagnosis not present

## 2015-08-27 DIAGNOSIS — I340 Nonrheumatic mitral (valve) insufficiency: Secondary | ICD-10-CM | POA: Diagnosis not present

## 2015-08-27 DIAGNOSIS — I10 Essential (primary) hypertension: Secondary | ICD-10-CM | POA: Diagnosis not present

## 2015-08-27 DIAGNOSIS — Z6839 Body mass index (BMI) 39.0-39.9, adult: Secondary | ICD-10-CM | POA: Insufficient documentation

## 2015-08-27 DIAGNOSIS — I517 Cardiomegaly: Secondary | ICD-10-CM | POA: Insufficient documentation

## 2015-08-27 DIAGNOSIS — E785 Hyperlipidemia, unspecified: Secondary | ICD-10-CM | POA: Diagnosis not present

## 2015-08-27 DIAGNOSIS — G4733 Obstructive sleep apnea (adult) (pediatric): Secondary | ICD-10-CM | POA: Insufficient documentation

## 2015-08-27 DIAGNOSIS — R931 Abnormal findings on diagnostic imaging of heart and coronary circulation: Secondary | ICD-10-CM | POA: Diagnosis not present

## 2015-09-09 ENCOUNTER — Ambulatory Visit (INDEPENDENT_AMBULATORY_CARE_PROVIDER_SITE_OTHER): Payer: Medicare Other | Admitting: Internal Medicine

## 2015-09-09 VITALS — BP 120/78 | HR 66 | Ht 71.0 in | Wt 296.9 lb

## 2015-09-09 DIAGNOSIS — I255 Ischemic cardiomyopathy: Secondary | ICD-10-CM

## 2015-09-09 DIAGNOSIS — F17200 Nicotine dependence, unspecified, uncomplicated: Secondary | ICD-10-CM | POA: Diagnosis not present

## 2015-09-09 DIAGNOSIS — Z9861 Coronary angioplasty status: Secondary | ICD-10-CM | POA: Diagnosis not present

## 2015-09-09 DIAGNOSIS — I2510 Atherosclerotic heart disease of native coronary artery without angina pectoris: Secondary | ICD-10-CM

## 2015-09-09 DIAGNOSIS — C8318 Mantle cell lymphoma, lymph nodes of multiple sites: Secondary | ICD-10-CM
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
## 2015-09-09 NOTE — Patient Instructions (Addendum)
Medication Instructions:  Your physician has recommended you make the following change in your medication:  1)  AFTER FEB. 24, 2017 - You can STOP taking Birlinta  2) Continue taking Aspirin 81 mg by mouth DAILY   Labwork: none  Testing/Procedures: none  Follow-Up: Your physician wants you to follow-up in: 6 months with Dr. Debara Pickett. You will receive a reminder letter in the mail two months in advance. If you don't receive a letter, please call our office to schedule the follow-up appointment.   Any Other Special Instructions Will Be Listed Below (If Applicable).     If you need a refill on your cardiac medications before your next appointment, please call your pharmacy.

## 2015-09-10 ENCOUNTER — Encounter: Payer: Self-pay | Admitting: Internal Medicine

## 2015-09-26 DIAGNOSIS — L570 Actinic keratosis: Secondary | ICD-10-CM | POA: Diagnosis not present

## 2015-09-26 DIAGNOSIS — D485 Neoplasm of uncertain behavior of skin: Secondary | ICD-10-CM | POA: Diagnosis not present

## 2015-09-26 DIAGNOSIS — C44329 Squamous cell carcinoma of skin of other parts of face: Secondary | ICD-10-CM | POA: Diagnosis not present

## 2015-10-05 ENCOUNTER — Other Ambulatory Visit: Payer: Self-pay | Admitting: Cardiology

## 2015-10-06 NOTE — Telephone Encounter (Signed)
Rx(s) sent to pharmacy electronically.  

## 2015-10-08 ENCOUNTER — Other Ambulatory Visit: Payer: Self-pay | Admitting: *Deleted

## 2015-10-08 MED ORDER — CARVEDILOL 3.125 MG PO TABS: 3.1250 mg | ORAL_TABLET | Freq: Two times a day (BID) | ORAL | Status: DC

## 2015-10-08 MED ORDER — PANTOPRAZOLE SODIUM 40 MG PO TBEC: 40.0000 mg | DELAYED_RELEASE_TABLET | Freq: Every day | ORAL | Status: DC

## 2015-10-08 MED ORDER — LISINOPRIL 2.5 MG PO TABS: 2.5000 mg | ORAL_TABLET | Freq: Every day | ORAL | Status: DC

## 2015-10-13 ENCOUNTER — Other Ambulatory Visit: Payer: Self-pay | Admitting: *Deleted

## 2015-10-15 ENCOUNTER — Other Ambulatory Visit: Payer: Self-pay | Admitting: *Deleted

## 2015-10-15 MED ORDER — CARVEDILOL 3.125 MG PO TABS: 3.1250 mg | ORAL_TABLET | Freq: Two times a day (BID) | ORAL | Status: DC

## 2015-10-15 MED ORDER — LISINOPRIL 2.5 MG PO TABS: 2.5000 mg | ORAL_TABLET | Freq: Every day | ORAL | Status: DC

## 2015-10-24 ENCOUNTER — Other Ambulatory Visit: Payer: Self-pay | Admitting: Cardiology

## 2015-10-24 NOTE — Telephone Encounter (Signed)
REFILL 

## 2015-11-12 DIAGNOSIS — I2510 Atherosclerotic heart disease of native coronary artery without angina pectoris: Secondary | ICD-10-CM | POA: Diagnosis not present

## 2015-11-12 DIAGNOSIS — Z23 Encounter for immunization: Secondary | ICD-10-CM | POA: Diagnosis not present

## 2015-11-12 DIAGNOSIS — J449 Chronic obstructive pulmonary disease, unspecified: Secondary | ICD-10-CM | POA: Diagnosis not present

## 2015-11-12 DIAGNOSIS — C8590 Non-Hodgkin lymphoma, unspecified, unspecified site: Secondary | ICD-10-CM | POA: Diagnosis not present

## 2015-11-12 DIAGNOSIS — M545 Low back pain: Secondary | ICD-10-CM | POA: Diagnosis not present

## 2015-11-19 ENCOUNTER — Other Ambulatory Visit (HOSPITAL_COMMUNITY): Payer: Self-pay | Admitting: Pulmonary Disease

## 2015-11-19 DIAGNOSIS — M545 Low back pain: Secondary | ICD-10-CM

## 2015-11-26 ENCOUNTER — Ambulatory Visit (HOSPITAL_COMMUNITY)
Admission: RE | Admit: 2015-11-26 | Discharge: 2015-11-26 | Disposition: A | Discharge status: Home / Self Care | Payer: Medicare Other | Source: Ambulatory Visit | Attending: Pulmonary Disease | Admitting: Pulmonary Disease

## 2015-11-26 DIAGNOSIS — I714 Abdominal aortic aneurysm, without rupture: Secondary | ICD-10-CM | POA: Insufficient documentation

## 2015-11-26 DIAGNOSIS — M4186 Other forms of scoliosis, lumbar region: Secondary | ICD-10-CM | POA: Diagnosis not present

## 2015-11-26 DIAGNOSIS — M5126 Other intervertebral disc displacement, lumbar region: Secondary | ICD-10-CM | POA: Diagnosis not present

## 2015-11-26 DIAGNOSIS — M545 Low back pain: Secondary | ICD-10-CM | POA: Insufficient documentation

## 2015-11-26 DIAGNOSIS — M5117 Intervertebral disc disorders with radiculopathy, lumbosacral region: Secondary | ICD-10-CM | POA: Diagnosis not present

## 2015-11-26 DIAGNOSIS — M4806 Spinal stenosis, lumbar region: Secondary | ICD-10-CM | POA: Diagnosis not present

## 2015-12-01 DIAGNOSIS — G4733 Obstructive sleep apnea (adult) (pediatric): Secondary | ICD-10-CM | POA: Diagnosis not present

## 2015-12-17 DIAGNOSIS — M5416 Radiculopathy, lumbar region: Secondary | ICD-10-CM | POA: Diagnosis not present

## 2016-01-15 DIAGNOSIS — M5136 Other intervertebral disc degeneration, lumbar region: Secondary | ICD-10-CM | POA: Diagnosis not present

## 2016-01-15 DIAGNOSIS — M5416 Radiculopathy, lumbar region: Secondary | ICD-10-CM | POA: Diagnosis not present

## 2016-02-21 IMAGING — CR DG CHEST 1V PORT
1 series · 1 of 1 positions shown · non-contrast
Comparison: 05/23/2014

CLINICAL DATA: Chest discomfort. ST segment elevation myocardial
infarction.

EXAM:
PORTABLE CHEST - 1 VIEW

[AP]
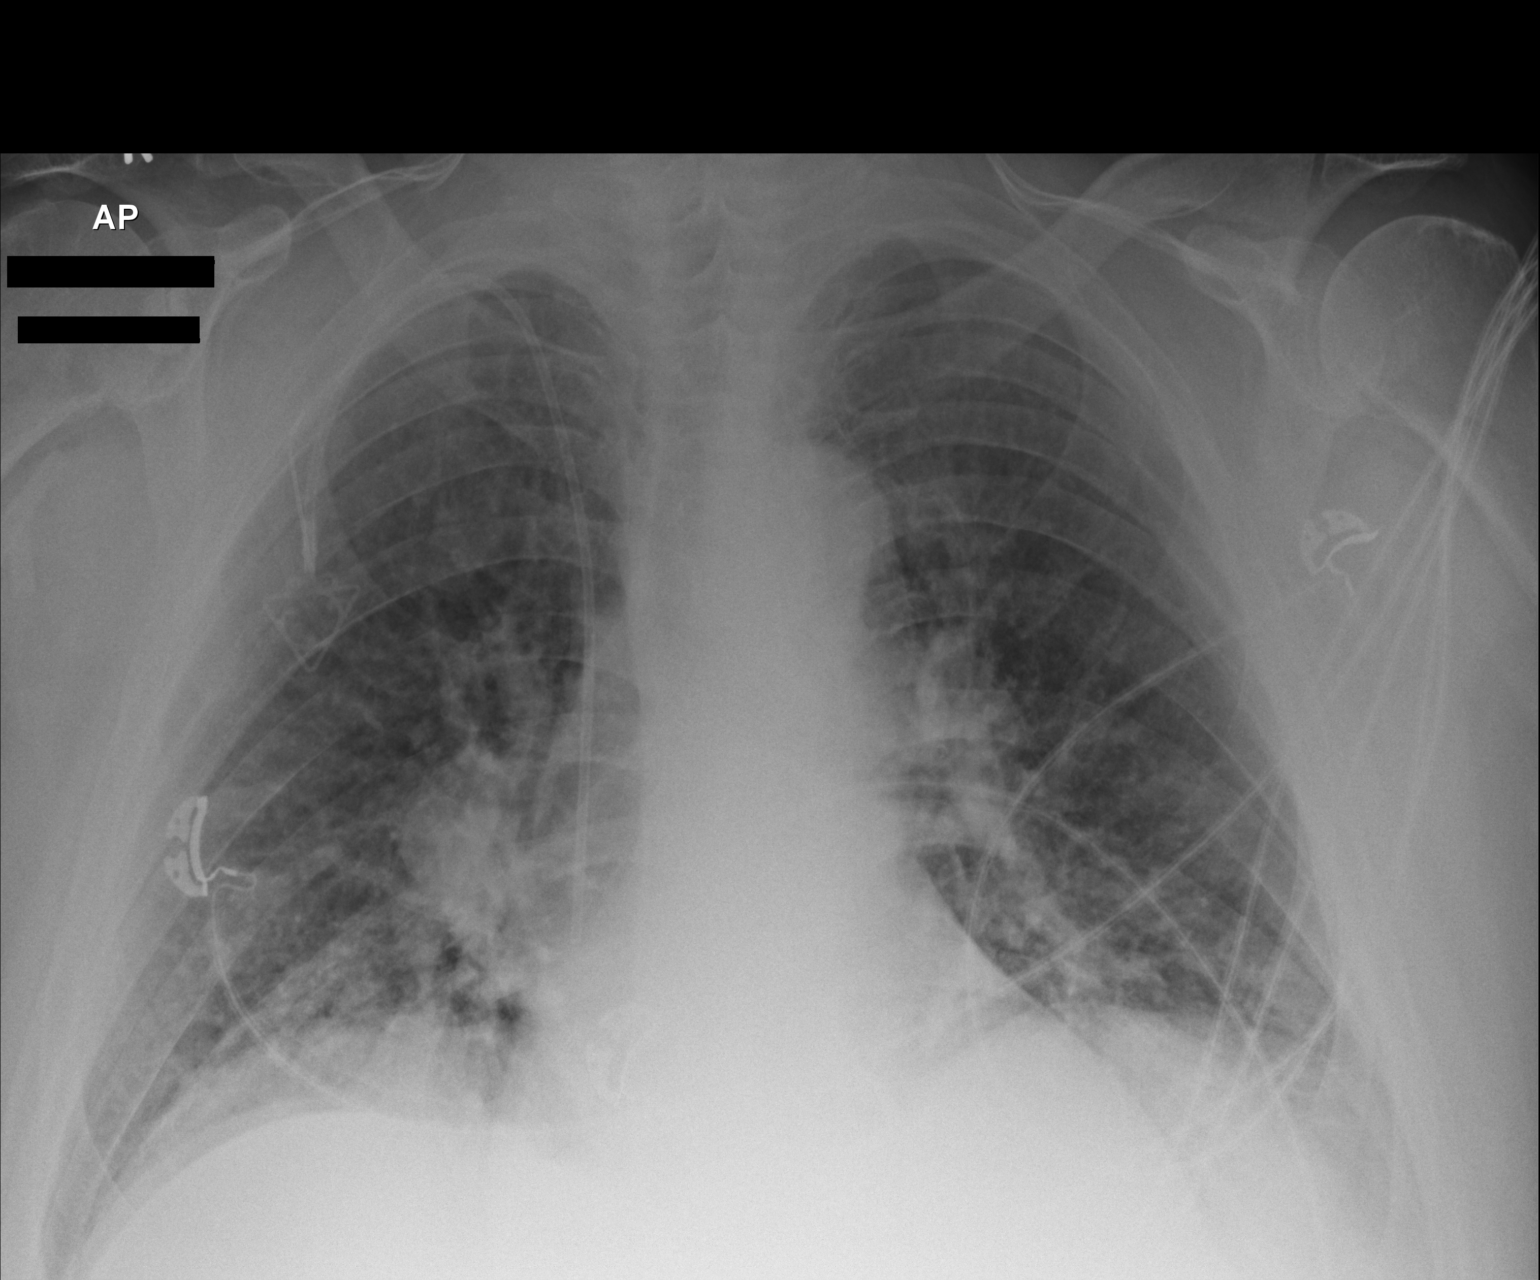

[1 of 1 positions shown; findings below may reference images not displayed]

FINDINGS: There is a right chest wall port a catheter with tip in the
cavoatrial junction. The heart size appears normal. The lung volumes
are low there is pulmonary vascular congestion. Atelectasis noted in
the lung bases.
IMPRESSION: 1. Low lung volumes with bibasilar atelectasis.
2. Pulmonary vascular congestion.

## 2016-02-27 ENCOUNTER — Other Ambulatory Visit: Payer: Self-pay | Admitting: Nurse Practitioner

## 2016-03-23 DIAGNOSIS — M5136 Other intervertebral disc degeneration, lumbar region: Secondary | ICD-10-CM | POA: Diagnosis not present

## 2016-03-23 DIAGNOSIS — M5416 Radiculopathy, lumbar region: Secondary | ICD-10-CM | POA: Diagnosis not present

## 2016-04-22 DIAGNOSIS — M545 Low back pain: Secondary | ICD-10-CM | POA: Diagnosis not present

## 2016-04-22 DIAGNOSIS — G4733 Obstructive sleep apnea (adult) (pediatric): Secondary | ICD-10-CM | POA: Diagnosis not present

## 2016-04-22 DIAGNOSIS — C8590 Non-Hodgkin lymphoma, unspecified, unspecified site: Secondary | ICD-10-CM | POA: Diagnosis not present

## 2016-04-22 DIAGNOSIS — J449 Chronic obstructive pulmonary disease, unspecified: Secondary | ICD-10-CM | POA: Diagnosis not present

## 2016-05-05 DIAGNOSIS — G4733 Obstructive sleep apnea (adult) (pediatric): Secondary | ICD-10-CM | POA: Diagnosis not present

## 2016-05-06 DIAGNOSIS — M5136 Other intervertebral disc degeneration, lumbar region: Secondary | ICD-10-CM | POA: Diagnosis not present

## 2016-05-06 DIAGNOSIS — M5416 Radiculopathy, lumbar region: Secondary | ICD-10-CM | POA: Diagnosis not present

## 2016-06-01 DIAGNOSIS — M5416 Radiculopathy, lumbar region: Secondary | ICD-10-CM | POA: Diagnosis not present

## 2016-06-01 DIAGNOSIS — M5136 Other intervertebral disc degeneration, lumbar region: Secondary | ICD-10-CM | POA: Diagnosis not present

## 2016-06-11 ENCOUNTER — Telehealth: Payer: Self-pay | Admitting: Internal Medicine

## 2016-06-11 NOTE — Telephone Encounter (Signed)
Pt wants to know if you received clearance from Dr Irven Baltimore office. He needs clearance for his back surgery. Will he need to see Dr Debara Pickett to get clearance?

## 2016-06-11 NOTE — Telephone Encounter (Signed)
Patient aware I have not seen electronic documentation of request to schedule surgery.  Dr. Trenton Gammon is going to do a procedure - patient understands he may need to come back in for OV prior to surgical clearance. Routed to [facility] to review. Note Dr. Irven Baltimore office phone [phone] - patient asks to call them if you have not received fax from them.

## 2016-06-11 NOTE — Telephone Encounter (Signed)
Spoke with pt, Follow up scheduled  

## 2016-06-16 ENCOUNTER — Encounter: Payer: Self-pay | Admitting: Internal Medicine

## 2016-06-16 ENCOUNTER — Ambulatory Visit (INDEPENDENT_AMBULATORY_CARE_PROVIDER_SITE_OTHER): Payer: Medicare Other | Admitting: Internal Medicine

## 2016-06-16 VITALS — BP 130/80 | HR 69 | Ht 71.0 in | Wt 305.4 lb

## 2016-06-16 DIAGNOSIS — I2510 Atherosclerotic heart disease of native coronary artery without angina pectoris: Secondary | ICD-10-CM

## 2016-06-16 DIAGNOSIS — I255 Ischemic cardiomyopathy: Secondary | ICD-10-CM | POA: Diagnosis not present

## 2016-06-16 DIAGNOSIS — Z9861 Coronary angioplasty status: Secondary | ICD-10-CM | POA: Diagnosis not present

## 2016-06-16 NOTE — Patient Instructions (Signed)
Medication Instructions:  Your physician recommends that you continue on your current medications as directed. Please refer to the Current Medication list given to you today.  Labwork: None   Testing/Procedures: None   Follow-Up: Your physician wants you to follow-up in: 6 months with Dr Debara Pickett. You will receive a reminder letter in the mail two months in advance. If you don't receive a letter, please call our office to schedule the follow-up appointment.  Any Other Special Instructions Will Be Listed Below (If Applicable).     If you need a refill on your cardiac medications before your next appointment, please call your pharmacy.
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
## 2016-06-28 ENCOUNTER — Other Ambulatory Visit: Payer: Self-pay | Admitting: Neurosurgery

## 2016-07-01 ENCOUNTER — Encounter (HOSPITAL_COMMUNITY): Payer: Self-pay

## 2016-07-01 ENCOUNTER — Encounter (HOSPITAL_COMMUNITY)
Admission: RE | Admit: 2016-07-01 | Discharge: 2016-07-01 | Disposition: A | Discharge status: Home / Self Care | Payer: Medicare Other | Source: Ambulatory Visit | Attending: Neurosurgery | Admitting: Neurosurgery

## 2016-07-01 DIAGNOSIS — Z01812 Encounter for preprocedural laboratory examination: Secondary | ICD-10-CM

## 2016-07-01 HISTORY — DX: Dyspnea, unspecified: R06.00

## 2016-07-01 LAB — CBC WITH DIFFERENTIAL/PLATELET
BASOS ABS: 0 10*3/uL (ref 0.0–0.1)
BASOS PCT: 0 %
Eosinophils Absolute: 0.1 10*3/uL (ref 0.0–0.7)
Eosinophils Relative: 2 %
HEMATOCRIT: 42.3 % (ref 39.0–52.0)
HEMOGLOBIN: 14.5 g/dL (ref 13.0–17.0)
Lymphocytes Relative: 10 %
Lymphs Abs: 0.6 10*3/uL — ABNORMAL LOW (ref 0.7–4.0)
MCH: 32.2 pg (ref 26.0–34.0)
MCHC: 34.3 g/dL (ref 30.0–36.0)
MCV: 93.8 fL (ref 78.0–100.0)
MONOS PCT: 8 %
Monocytes Absolute: 0.5 10*3/uL (ref 0.1–1.0)
NEUTROS ABS: 4.9 10*3/uL (ref 1.7–7.7)
NEUTROS PCT: 80 %
Platelets: 151 10*3/uL (ref 150–400)
RBC: 4.51 MIL/uL (ref 4.22–5.81)
RDW: 14 % (ref 11.5–15.5)
WBC: 6.2 10*3/uL (ref 4.0–10.5)

## 2016-07-01 LAB — BASIC METABOLIC PANEL
Anion gap: 8 (ref 5–15)
BUN: 13 mg/dL (ref 6–20)
CALCIUM: 8.8 mg/dL — AB (ref 8.9–10.3)
CO2: 23 mmol/L (ref 22–32)
CREATININE: 1.07 mg/dL (ref 0.61–1.24)
Chloride: 105 mmol/L (ref 101–111)
GFR calc non Af Amer: >60 mL/min (ref >60)
Glucose, Bld: 109 mg/dL — ABNORMAL HIGH (ref 65–99)
Potassium: 4.1 mmol/L (ref 3.5–5.1)
SODIUM: 136 mmol/L (ref 135–145)

## 2016-07-01 LAB — TYPE AND SCREEN
ABO/RH(D): B POS
ANTIBODY SCREEN: NEGATIVE

## 2016-07-01 LAB — SURGICAL PCR SCREEN
MRSA, PCR: NEGATIVE
STAPHYLOCOCCUS AUREUS: NEGATIVE

## 2016-07-01 MED ORDER — DEXTROSE 5 % IV SOLN
3.0000 g | INTRAVENOUS | Status: AC
  Administered 2016-07-02: 3 g via INTRAVENOUS
  Filled 2016-07-01 (×2): qty 3000

## 2016-07-01 NOTE — Progress Notes (Signed)
Anesthesia Chart Review: Patient is a 75 year old male scheduled for L3-4 anterolateral fusion on 07/02/16 by Dr. Annette Stable.  History includes smoking, HTN, HLD, peripheral edema, CAD/anterior STEMI with cardiogenic shock s/p DES LAD 10/23/14, ischemic cardiomyopathy, severe OSA (CPAP), anxiety, ED, morbid obesity, mantle cell lymphoma s/p chemotherapy '15, right TKA '13.   - PCP is Dr. Sinda Du. - HEM-ONC is Dr. Ancil Linsey. - Cardiologist is Dr. Lyman Bishop, last visit 06/16/16 for follow-up and preoperative risk assessment. He wrote, "Mr. Statz is doing well without recurrent chest pain. He is contemplating upcoming surgery for low back pain. He should be at low risk for this and is currently only on aspirin monotherapy. He is more than a year and a half out since his drug-eluting stent placement. I would recommend holding aspirin 7 days prior to surgery and restarting afterwards. I've encouraged him on cutting back on his smoking as he is down now to 1 pack per day. Hopefully during his hospitalization he could be encouraged to stop smoking and quit permanently. His EF has improved up to 45-50% by echo in December 2016."  Meds include ASA 81 mg (on hold), Celebrex, Klonopin, gabapentin, Norco, Nitro, primidone, Requip, Rozerem, Flomax, Zanaflex.    BP 125/61   Pulse 66   Temp 36.8 C   Resp 20   Ht '5\' 11"'$  (1.803 m)   Wt (!) 311 lb 14.4 oz (141.5 kg)   SpO2 96%   BMI 43.50 kg/m    09/09/15 EKG: SR, occasional PVC, low voltage QRS.  08/27/15 Echo: Study Conclusions - Left ventricle: The cavity size was normal. There was mild   concentric hypertrophy. Systolic function was mildly reduced. The   estimated ejection fraction was in the range of 45% to 50%.   Hypokinesis of the anteroseptal, anterior and apical myocardium.   Doppler parameters are consistent with abnormal left ventricular   relaxation (grade 1 diastolic dysfunction). Doppler parameters   are consistent with high  ventricular filling pressure. - Mitral valve: There was mild regurgitation. - Left atrium: The atrium was severely dilated. - Right ventricle: The cavity size was mildly dilated. Wall   thickness was normal. Systolic function was normal. - Atrial septum: No defect or patent foramen ovale was identified. - Inferior vena cava: The vessel was normal in size. The   respirophasic diameter changes were in the normal range (= 50%),   consistent with normal central venous pressure. (Comparison EF 40-45% by 10/24/14 echo.)  10/23/14 Cardiac cath: Coronary dominance: left Left mainstem: Normal Left anterior descending (LAD): The LAD is occluded 100% in the proximal vessel. The proximal to mid vessel is calcified. Left circumflex (LCx): Large dominant vessel. It gives off a large bifurcating OM branch before terminating in the PL and PDA branches. The OM has a 60% stenosis prior to the bifurcation into 2 large OM branches. The more lateral branch has a 90% stenosis in the mid vessel.  Right coronary artery (RCA): The RCA is a nondominant vessel. There is a long segment of 80% stenosis in the proximal to mid vessel with a focal 80-90% stenosis in the mid vessel. Left ventriculography: Not done due to elevated EDP. IABP placement. PCI: Stent 3.5 x 32 mm Synergy stent to proximal LAD. 0% (post).  09/03/13 PFTs: FVC 3.07 (72%), FEV1 2.19 (70%), DLCO unc 21.50 (69%).  Preoperative labs noted.   He has cardiac clearance. If no acute changes then I anticipate that he can proceed as planned.  Myra Gianotti,  PA-C [facility] Stay [facility]/Anesthesiology Phone [phone] 07/01/2016 4:44 PM

## 2016-07-01 NOTE — Pre-Procedure Instructions (Signed)
    [name] [name]  07/01/2016    Your procedure is scheduled on Friday, November 3.  Report to [facility] Admitting at 8:30 AM                 Your surgery or procedure is scheduled for 10:30 AM   Call this number if you have problems the morning of surgery:[phone]   Remember:  Do not eat food or drink liquids after midnight.  Take these medicines the morning of surgery with A SIP OF WATER:gabapentin (NEURONTIN), tamsulosin (FLOMAX). May use eye drops if needed, may take nitroGLYCERIN (NITROSTAT),tiZANidine (ZANAFLEX), if needed.                    Stop taking Aspirin, Aspirin Products and herbal medications.  Do not take any NSAIDS ie:  Ibuprofen, Advil, Naproxen, celecoxib (CELEBREX).   Do not wear jewelry, make-up or nail polish.  Do not wear lotions, powders, or perfumes, or deodorant.             Men may shave face and neck.  Do not bring valuables to the hospital.  [facility] is not responsible for any belongings or valuables.  Contacts, dentures or bridgework may not be worn into surgery.  Leave your suitcase in the car.  After surgery it may be brought to your room.  For patients admitted to the hospital, discharge time will be determined by your treatment team.  Special instructions:  Review  [name] - Preparing For Surgery.  Please read over the following fact sheets that you were  given: [name]- Preparing For Surgery and Patient Instructions for Mupirocin Application, Coughing and Deep Breathing and Pain Management

## 2016-07-02 ENCOUNTER — Encounter (HOSPITAL_COMMUNITY)
Admission: RE | Disposition: A | Discharge status: Home / Self Care | Payer: Self-pay | Source: Ambulatory Visit | Attending: Neurosurgery

## 2016-07-02 ENCOUNTER — Inpatient Hospital Stay (HOSPITAL_COMMUNITY): Payer: Medicare Other | Admitting: Vascular Surgery

## 2016-07-02 ENCOUNTER — Encounter (HOSPITAL_COMMUNITY): Payer: Self-pay | Admitting: Urology

## 2016-07-02 ENCOUNTER — Inpatient Hospital Stay (HOSPITAL_COMMUNITY): Payer: Medicare Other

## 2016-07-02 ENCOUNTER — Inpatient Hospital Stay (HOSPITAL_COMMUNITY)
Admission: RE | Admit: 2016-07-02 | Discharge: 2016-07-04 | DRG: 460 | Disposition: A | Discharge status: Home / Self Care | Payer: Medicare Other | Source: Ambulatory Visit | Attending: Neurosurgery | Admitting: Neurosurgery

## 2016-07-02 ENCOUNTER — Inpatient Hospital Stay (HOSPITAL_COMMUNITY): Payer: Medicare Other | Admitting: Certified Registered"

## 2016-07-02 DIAGNOSIS — M48061 Spinal stenosis, lumbar region without neurogenic claudication: Secondary | ICD-10-CM | POA: Diagnosis present

## 2016-07-02 DIAGNOSIS — I255 Ischemic cardiomyopathy: Secondary | ICD-10-CM | POA: Diagnosis present

## 2016-07-02 DIAGNOSIS — Z79899 Other long term (current) drug therapy: Secondary | ICD-10-CM | POA: Diagnosis not present

## 2016-07-02 DIAGNOSIS — G4733 Obstructive sleep apnea (adult) (pediatric): Secondary | ICD-10-CM | POA: Diagnosis present

## 2016-07-02 DIAGNOSIS — I2510 Atherosclerotic heart disease of native coronary artery without angina pectoris: Secondary | ICD-10-CM | POA: Diagnosis present

## 2016-07-02 DIAGNOSIS — M5136 Other intervertebral disc degeneration, lumbar region: Secondary | ICD-10-CM | POA: Diagnosis present

## 2016-07-02 DIAGNOSIS — Z8249 Family history of ischemic heart disease and other diseases of the circulatory system: Secondary | ICD-10-CM

## 2016-07-02 DIAGNOSIS — Z8572 Personal history of non-Hodgkin lymphomas: Secondary | ICD-10-CM | POA: Diagnosis not present

## 2016-07-02 DIAGNOSIS — Z9889 Other specified postprocedural states: Secondary | ICD-10-CM

## 2016-07-02 DIAGNOSIS — F17210 Nicotine dependence, cigarettes, uncomplicated: Secondary | ICD-10-CM | POA: Diagnosis present

## 2016-07-02 DIAGNOSIS — I252 Old myocardial infarction: Secondary | ICD-10-CM

## 2016-07-02 DIAGNOSIS — M1990 Unspecified osteoarthritis, unspecified site: Secondary | ICD-10-CM | POA: Diagnosis present

## 2016-07-02 DIAGNOSIS — Z7982 Long term (current) use of aspirin: Secondary | ICD-10-CM | POA: Diagnosis not present

## 2016-07-02 DIAGNOSIS — Z96653 Presence of artificial knee joint, bilateral: Secondary | ICD-10-CM | POA: Diagnosis present

## 2016-07-02 DIAGNOSIS — M129 Arthropathy, unspecified: Secondary | ICD-10-CM | POA: Diagnosis present

## 2016-07-02 DIAGNOSIS — Z9861 Coronary angioplasty status: Secondary | ICD-10-CM

## 2016-07-02 DIAGNOSIS — M479 Spondylosis, unspecified: Secondary | ICD-10-CM | POA: Diagnosis present

## 2016-07-02 DIAGNOSIS — Z419 Encounter for procedure for purposes other than remedying health state, unspecified: Secondary | ICD-10-CM

## 2016-07-02 DIAGNOSIS — I10 Essential (primary) hypertension: Secondary | ICD-10-CM | POA: Diagnosis present

## 2016-07-02 DIAGNOSIS — Z23 Encounter for immunization: Secondary | ICD-10-CM

## 2016-07-02 HISTORY — PX: ANTERIOR LAT LUMBAR FUSION: SHX1168

## 2016-07-02 LAB — GLUCOSE, CAPILLARY: GLUCOSE-CAPILLARY: 163 mg/dL — AB (ref 65–99)

## 2016-07-02 SURGERY — ANTERIOR LATERAL LUMBAR FUSION 1 LEVEL
Anesthesia: General

## 2016-07-02 MED ORDER — ASPIRIN 81 MG PO CHEW
81.0000 mg | CHEWABLE_TABLET | Freq: Every day | ORAL | Status: DC
  Administered 2016-07-02 – 2016-07-04 (×3): 81 mg via ORAL
  Filled 2016-07-02 (×3): qty 1

## 2016-07-02 MED ORDER — INSULIN ASPART 100 UNIT/ML ~~LOC~~ SOLN
0.0000 [IU] | Freq: Three times a day (TID) | SUBCUTANEOUS | Status: DC
  Administered 2016-07-03: 2 [IU] via SUBCUTANEOUS
  Administered 2016-07-03: 3 [IU] via SUBCUTANEOUS

## 2016-07-02 MED ORDER — PHENOL 1.4 % MT LIQD: 1.0000 | OROMUCOSAL | Status: DC | PRN

## 2016-07-02 MED ORDER — DEXTROSE 5 % IV SOLN
INTRAVENOUS | Status: DC | PRN
  Administered 2016-07-02: 30 ug/min via INTRAVENOUS

## 2016-07-02 MED ORDER — ONDANSETRON HCL 4 MG/2ML IJ SOLN
INTRAMUSCULAR | Status: AC
  Filled 2016-07-02: qty 2

## 2016-07-02 MED ORDER — OXYCODONE-ACETAMINOPHEN 5-325 MG PO TABS
ORAL_TABLET | ORAL | Status: AC
  Filled 2016-07-02: qty 2

## 2016-07-02 MED ORDER — OXYCODONE HCL 5 MG PO TABS
5.0000 mg | ORAL_TABLET | Freq: Once | ORAL | Status: AC | PRN
  Administered 2016-07-02: 5 mg via ORAL

## 2016-07-02 MED ORDER — BUPIVACAINE HCL (PF) 0.25 % IJ SOLN
INTRAMUSCULAR | Status: DC | PRN
  Administered 2016-07-02: 30 mL

## 2016-07-02 MED ORDER — FENTANYL CITRATE (PF) 100 MCG/2ML IJ SOLN
INTRAMUSCULAR | Status: AC
  Filled 2016-07-02: qty 4

## 2016-07-02 MED ORDER — LIDOCAINE HCL (CARDIAC) 20 MG/ML IV SOLN
INTRAVENOUS | Status: DC | PRN
  Administered 2016-07-02: 70 mg via INTRAVENOUS

## 2016-07-02 MED ORDER — HYDROMORPHONE HCL 1 MG/ML IJ SOLN
INTRAMUSCULAR | Status: AC
  Filled 2016-07-02: qty 0.5

## 2016-07-02 MED ORDER — THROMBIN 5000 UNITS EX SOLR
CUTANEOUS | Status: DC | PRN
  Administered 2016-07-02 (×2): 5000 [IU] via TOPICAL

## 2016-07-02 MED ORDER — FENTANYL CITRATE (PF) 100 MCG/2ML IJ SOLN
INTRAMUSCULAR | Status: AC
  Filled 2016-07-02: qty 2

## 2016-07-02 MED ORDER — MIDAZOLAM HCL 2 MG/2ML IJ SOLN
INTRAMUSCULAR | Status: AC
  Filled 2016-07-02: qty 2

## 2016-07-02 MED ORDER — ROPINIROLE HCL 1 MG PO TABS
2.0000 mg | ORAL_TABLET | Freq: Every day | ORAL | Status: DC
  Administered 2016-07-02 – 2016-07-03 (×2): 2 mg via ORAL
  Filled 2016-07-02 (×2): qty 2

## 2016-07-02 MED ORDER — CHLORHEXIDINE GLUCONATE CLOTH 2 % EX PADS: 6.0000 | MEDICATED_PAD | Freq: Once | CUTANEOUS | Status: DC

## 2016-07-02 MED ORDER — PRIMIDONE 50 MG PO TABS: 50.0000 mg | ORAL_TABLET | Freq: Three times a day (TID) | ORAL | Status: DC | PRN

## 2016-07-02 MED ORDER — ONDANSETRON HCL 4 MG/2ML IJ SOLN
INTRAMUSCULAR | Status: DC | PRN
  Administered 2016-07-02: 4 mg via INTRAVENOUS

## 2016-07-02 MED ORDER — HYDROCODONE-ACETAMINOPHEN 5-325 MG PO TABS
1.0000 | ORAL_TABLET | ORAL | Status: DC | PRN
  Administered 2016-07-03 (×2): 2 via ORAL
  Filled 2016-07-02 (×3): qty 2

## 2016-07-02 MED ORDER — KETOROLAC TROMETHAMINE 30 MG/ML IJ SOLN
INTRAMUSCULAR | Status: AC
  Filled 2016-07-02: qty 1

## 2016-07-02 MED ORDER — MENTHOL 3 MG MT LOZG: 1.0000 | LOZENGE | OROMUCOSAL | Status: DC | PRN

## 2016-07-02 MED ORDER — PROPOFOL 10 MG/ML IV BOLUS
INTRAVENOUS | Status: AC
  Filled 2016-07-02: qty 20

## 2016-07-02 MED ORDER — SODIUM CHLORIDE 0.9 % IV SOLN
250.0000 mL | INTRAVENOUS | Status: DC
  Administered 2016-07-02: 250 mL via INTRAVENOUS

## 2016-07-02 MED ORDER — BUPIVACAINE HCL (PF) 0.25 % IJ SOLN
INTRAMUSCULAR | Status: AC
  Filled 2016-07-02: qty 30

## 2016-07-02 MED ORDER — HYDROMORPHONE HCL 2 MG/ML IJ SOLN
INTRAMUSCULAR | Status: AC
  Filled 2016-07-02: qty 1

## 2016-07-02 MED ORDER — HYDROMORPHONE HCL 1 MG/ML IJ SOLN
0.2500 mg | INTRAMUSCULAR | Status: DC | PRN
  Administered 2016-07-02 (×4): 0.5 mg via INTRAVENOUS

## 2016-07-02 MED ORDER — SODIUM CHLORIDE 0.9 % IR SOLN
Status: DC | PRN
  Administered 2016-07-02: 13:00:00

## 2016-07-02 MED ORDER — FENTANYL CITRATE (PF) 100 MCG/2ML IJ SOLN
INTRAMUSCULAR | Status: DC | PRN
  Administered 2016-07-02 (×4): 50 ug via INTRAVENOUS
  Administered 2016-07-02: 100 ug via INTRAVENOUS

## 2016-07-02 MED ORDER — ROCURONIUM BROMIDE 10 MG/ML (PF) SYRINGE
PREFILLED_SYRINGE | INTRAVENOUS | Status: AC
  Filled 2016-07-02: qty 10

## 2016-07-02 MED ORDER — DEXAMETHASONE SODIUM PHOSPHATE 10 MG/ML IJ SOLN
INTRAMUSCULAR | Status: AC
  Filled 2016-07-02: qty 1

## 2016-07-02 MED ORDER — HEMOSTATIC AGENTS (NO CHARGE) OPTIME
TOPICAL | Status: DC | PRN
  Administered 2016-07-02: 1 via TOPICAL

## 2016-07-02 MED ORDER — DIAZEPAM 5 MG PO TABS
ORAL_TABLET | ORAL | Status: AC
  Filled 2016-07-02: qty 1

## 2016-07-02 MED ORDER — PROPOFOL 10 MG/ML IV BOLUS
INTRAVENOUS | Status: DC | PRN
  Administered 2016-07-02: 180 mg via INTRAVENOUS

## 2016-07-02 MED ORDER — ADULT MULTIVITAMIN W/MINERALS CH
2.0000 | ORAL_TABLET | Freq: Every day | ORAL | Status: DC
  Administered 2016-07-02 – 2016-07-04 (×3): 2 via ORAL
  Filled 2016-07-02 (×4): qty 2

## 2016-07-02 MED ORDER — ONDANSETRON HCL 4 MG/2ML IJ SOLN: 4.0000 mg | INTRAMUSCULAR | Status: DC | PRN

## 2016-07-02 MED ORDER — OXYCODONE HCL 5 MG PO TABS
ORAL_TABLET | ORAL | Status: AC
  Filled 2016-07-02: qty 1

## 2016-07-02 MED ORDER — GABAPENTIN 300 MG PO CAPS
600.0000 mg | ORAL_CAPSULE | Freq: Two times a day (BID) | ORAL | Status: DC
  Administered 2016-07-02 – 2016-07-04 (×4): 600 mg via ORAL
  Filled 2016-07-02 (×4): qty 2

## 2016-07-02 MED ORDER — CLONAZEPAM 1 MG PO TABS
2.0000 mg | ORAL_TABLET | Freq: Every day | ORAL | Status: DC
  Administered 2016-07-02 – 2016-07-03 (×2): 2 mg via ORAL
  Filled 2016-07-02 (×2): qty 2

## 2016-07-02 MED ORDER — CEFAZOLIN IN D5W 1 GM/50ML IV SOLN
1.0000 g | Freq: Three times a day (TID) | INTRAVENOUS | Status: AC
  Administered 2016-07-02 – 2016-07-03 (×2): 1 g via INTRAVENOUS
  Filled 2016-07-02 (×2): qty 50

## 2016-07-02 MED ORDER — 0.9 % SODIUM CHLORIDE (POUR BTL) OPTIME
TOPICAL | Status: DC | PRN
  Administered 2016-07-02: 1000 mL

## 2016-07-02 MED ORDER — RAMELTEON 8 MG PO TABS
8.0000 mg | ORAL_TABLET | Freq: Every evening | ORAL | Status: DC | PRN
  Filled 2016-07-02: qty 1

## 2016-07-02 MED ORDER — DEXAMETHASONE SODIUM PHOSPHATE 10 MG/ML IJ SOLN
10.0000 mg | INTRAMUSCULAR | Status: AC
  Administered 2016-07-02: 10 mg via INTRAVENOUS

## 2016-07-02 MED ORDER — TESTOSTERONE 20.25 MG/ACT (1.62%) TD GEL: 1.0000 "application " | Freq: Every day | TRANSDERMAL | Status: DC

## 2016-07-02 MED ORDER — THROMBIN 5000 UNITS EX SOLR
CUTANEOUS | Status: AC
  Filled 2016-07-02: qty 10000

## 2016-07-02 MED ORDER — SODIUM CHLORIDE 0.9% FLUSH
3.0000 mL | Freq: Two times a day (BID) | INTRAVENOUS | Status: DC
  Administered 2016-07-02 – 2016-07-03 (×2): 3 mL via INTRAVENOUS

## 2016-07-02 MED ORDER — NITROGLYCERIN 0.4 MG SL SUBL: 0.4000 mg | SUBLINGUAL_TABLET | SUBLINGUAL | Status: DC | PRN

## 2016-07-02 MED ORDER — GABAPENTIN 300 MG PO CAPS
300.0000 mg | ORAL_CAPSULE | Freq: Every day | ORAL | Status: DC
  Administered 2016-07-03: 300 mg via ORAL
  Filled 2016-07-02: qty 1

## 2016-07-02 MED ORDER — LACTATED RINGERS IV SOLN
INTRAVENOUS | Status: DC
  Administered 2016-07-02 (×3): via INTRAVENOUS

## 2016-07-02 MED ORDER — OXYCODONE HCL 5 MG/5ML PO SOLN: 5.0000 mg | Freq: Once | ORAL | Status: AC | PRN

## 2016-07-02 MED ORDER — ONDANSETRON HCL 4 MG/2ML IJ SOLN: 4.0000 mg | Freq: Four times a day (QID) | INTRAMUSCULAR | Status: DC | PRN

## 2016-07-02 MED ORDER — SODIUM CHLORIDE 0.9% FLUSH: 3.0000 mL | INTRAVENOUS | Status: DC | PRN

## 2016-07-02 MED ORDER — GABAPENTIN 300 MG PO CAPS
300.0000 mg | ORAL_CAPSULE | Freq: Three times a day (TID) | ORAL | Status: DC
Start: 2016-07-02 — End: 2016-07-02

## 2016-07-02 MED ORDER — HYDROMORPHONE HCL 1 MG/ML IJ SOLN
0.5000 mg | INTRAMUSCULAR | Status: DC | PRN
  Administered 2016-07-02 – 2016-07-03 (×4): 0.5 mg via INTRAVENOUS
  Administered 2016-07-03: 1 mg via INTRAVENOUS
  Administered 2016-07-03: 0.5 mg via INTRAVENOUS
  Administered 2016-07-04: 1 mg via INTRAVENOUS
  Filled 2016-07-02 (×6): qty 1

## 2016-07-02 MED ORDER — ACETAMINOPHEN 650 MG RE SUPP: 650.0000 mg | RECTAL | Status: DC | PRN

## 2016-07-02 MED ORDER — ACETAMINOPHEN 325 MG PO TABS
650.0000 mg | ORAL_TABLET | ORAL | Status: DC | PRN
  Administered 2016-07-03: 650 mg via ORAL
  Filled 2016-07-02: qty 2

## 2016-07-02 MED ORDER — DIAZEPAM 5 MG PO TABS
5.0000 mg | ORAL_TABLET | Freq: Four times a day (QID) | ORAL | Status: DC | PRN
  Administered 2016-07-02: 5 mg via ORAL

## 2016-07-02 MED ORDER — LIDOCAINE 2% (20 MG/ML) 5 ML SYRINGE
INTRAMUSCULAR | Status: AC
  Filled 2016-07-02: qty 5

## 2016-07-02 MED ORDER — TAMSULOSIN HCL 0.4 MG PO CAPS
0.4000 mg | ORAL_CAPSULE | Freq: Every day | ORAL | Status: DC
  Administered 2016-07-02 – 2016-07-04 (×3): 0.4 mg via ORAL
  Filled 2016-07-02 (×3): qty 1

## 2016-07-02 MED ORDER — LIDOCAINE HCL 4 % EX SOLN
CUTANEOUS | Status: DC | PRN
  Administered 2016-07-02: 2 mL via TOPICAL

## 2016-07-02 MED ORDER — SUCCINYLCHOLINE CHLORIDE 20 MG/ML IJ SOLN
INTRAMUSCULAR | Status: DC | PRN
  Administered 2016-07-02: 140 mg via INTRAVENOUS

## 2016-07-02 MED ORDER — OXYCODONE-ACETAMINOPHEN 5-325 MG PO TABS
1.0000 | ORAL_TABLET | ORAL | Status: DC | PRN
  Administered 2016-07-02: 1 via ORAL
  Administered 2016-07-03 – 2016-07-04 (×4): 2 via ORAL
  Filled 2016-07-02 (×5): qty 2

## 2016-07-02 SURGICAL SUPPLY — 63 items
ADH SKN CLS APL DERMABOND .7 (GAUZE/BANDAGES/DRESSINGS) ×1
APL SKNCLS STERI-STRIP NONHPOA (GAUZE/BANDAGES/DRESSINGS) ×2
BAG DECANTER FOR FLEXI CONT (MISCELLANEOUS) ×4 IMPLANT
BENZOIN TINCTURE PRP APPL 2/3 (GAUZE/BANDAGES/DRESSINGS) ×6 IMPLANT
BLADE CLIPPER SURG (BLADE) IMPLANT
BONE MATRIX OSTEOCEL PRO MED (Bone Implant) ×2 IMPLANT
CLOSURE WOUND 1/2 X4 (GAUZE/BANDAGES/DRESSINGS) ×2
CONT SPEC 4OZ CLIKSEAL STRL BL (MISCELLANEOUS) ×1 IMPLANT
CORENT WIDE 10X22X55 (Orthopedic Implant) ×3 IMPLANT
COROENT WIDE 10X22X55 (Orthopedic Implant) IMPLANT
COVER BACK TABLE 24X17X13 BIG (DRAPES) IMPLANT
COVER BACK TABLE 60X90IN (DRAPES) ×1 IMPLANT
DERMABOND ADVANCED (GAUZE/BANDAGES/DRESSINGS) ×2
DERMABOND ADVANCED .7 DNX12 (GAUZE/BANDAGES/DRESSINGS) ×2 IMPLANT
DRAPE C-ARM 42X72 X-RAY (DRAPES) ×4 IMPLANT
DRAPE C-ARMOR (DRAPES) ×6 IMPLANT
DRAPE LAPAROTOMY 100X72X124 (DRAPES) ×4 IMPLANT
DRAPE POUCH INSTRU U-SHP 10X18 (DRAPES) ×3 IMPLANT
DRAPE SURG 17X23 STRL (DRAPES) ×10 IMPLANT
DRSG OPSITE POSTOP 4X6 (GAUZE/BANDAGES/DRESSINGS) ×2 IMPLANT
DRSG OPSITE POSTOP 4X8 (GAUZE/BANDAGES/DRESSINGS) ×2 IMPLANT
ELECT BLADE 4.0 EZ CLEAN MEGAD (MISCELLANEOUS)
ELECT BLADE 6.5 EXT (BLADE) IMPLANT
ELECT REM PT RETURN 9FT ADLT (ELECTROSURGICAL) ×3
ELECTRODE BLDE 4.0 EZ CLN MEGD (MISCELLANEOUS) IMPLANT
ELECTRODE REM PT RTRN 9FT ADLT (ELECTROSURGICAL) ×2 IMPLANT
EVACUATOR 1/8 PVC DRAIN (DRAIN) IMPLANT
GAUZE SPONGE 4X4 12PLY STRL (GAUZE/BANDAGES/DRESSINGS) ×1 IMPLANT
GAUZE SPONGE 4X4 16PLY XRAY LF (GAUZE/BANDAGES/DRESSINGS) IMPLANT
GLOVE ECLIPSE 9.0 STRL (GLOVE) ×4 IMPLANT
GLOVE EXAM NITRILE LRG STRL (GLOVE) IMPLANT
GLOVE EXAM NITRILE XL STR (GLOVE) IMPLANT
GLOVE EXAM NITRILE XS STR PU (GLOVE) IMPLANT
GOWN STRL REUS W/ TWL LRG LVL3 (GOWN DISPOSABLE) IMPLANT
GOWN STRL REUS W/ TWL XL LVL3 (GOWN DISPOSABLE) ×3 IMPLANT
GOWN STRL REUS W/TWL 2XL LVL3 (GOWN DISPOSABLE) IMPLANT
GOWN STRL REUS W/TWL LRG LVL3 (GOWN DISPOSABLE)
GOWN STRL REUS W/TWL XL LVL3 (GOWN DISPOSABLE) ×6
GUIDEWIRE NITINOL BEVEL TIP (WIRE) ×2 IMPLANT
KIT BASIN OR (CUSTOM PROCEDURE TRAY) ×4 IMPLANT
KIT DILATOR XLIF 5 (KITS) IMPLANT
KIT ROOM TURNOVER OR (KITS) ×4 IMPLANT
KIT SURGICAL ACCESS MAXCESS 4 (KITS) ×2 IMPLANT
KIT XLIF (KITS) ×2
MODULE NVM5 NEXT GEN EMG (NEEDLE) ×2 IMPLANT
NDL I-PASS III (NEEDLE) IMPLANT
NEEDLE HYPO 22GX1.5 SAFETY (NEEDLE) ×4 IMPLANT
NEEDLE I-PASS III (NEEDLE) ×3 IMPLANT
NS IRRIG 1000ML POUR BTL (IV SOLUTION) ×4 IMPLANT
PACK LAMINECTOMY NEURO (CUSTOM PROCEDURE TRAY) ×4 IMPLANT
PUTTY BONE DBX 2.5 MIS (Bone Implant) ×2 IMPLANT
ROD RELINE MAS LORD 5.5X45MM (Rod) ×2 IMPLANT
SCREW LOCK RELINE 5.5 TULIP (Screw) ×4 IMPLANT
SCREW MAS RELINE 6.5X45 POLY (Screw) ×4 IMPLANT
SPONGE LAP 4X18 X RAY DECT (DISPOSABLE) IMPLANT
SPONGE SURGIFOAM ABS GEL SZ50 (HEMOSTASIS) IMPLANT
STRIP CLOSURE SKIN 1/2X4 (GAUZE/BANDAGES/DRESSINGS) ×3 IMPLANT
SUT VIC AB 2-0 CT1 18 (SUTURE) ×9 IMPLANT
SUT VIC AB 3-0 SH 8-18 (SUTURE) ×7 IMPLANT
TOWEL OR 17X24 6PK STRL BLUE (TOWEL DISPOSABLE) ×4 IMPLANT
TOWEL OR 17X26 10 PK STRL BLUE (TOWEL DISPOSABLE) ×4 IMPLANT
TRAY FOLEY W/METER SILVER 16FR (SET/KITS/TRAYS/PACK) ×3 IMPLANT
WATER STERILE IRR 1000ML POUR (IV SOLUTION) ×4 IMPLANT

## 2016-07-02 NOTE — Op Note (Signed)
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  Left L3-4 posterior percutaneous pedicle screw fixation  Surgeon:Anitra Doxtater A.Gabriele Loveland, M.D.  Asst. Surgeon: Saintclair Halsted  Anesthesia: General  Indication: 75 year old male with multilevel disc degeneration and severe spondylosis. Patient's primary issue appears to be at L3-4 with evidence of marked facet arthropathy with facet joint diastases and instability. Patient presents now for L3 for decompression and fusion in hopes of improving his symptoms.  Operative note: After induction of anesthesia, patient position in the right lateral decubitus position. Patient's left flank and lumbar region prepped and draped sterilely. Incision made overlying L3-4. A secondary incision made in the patient's left flank. Exploring the left flank incision blunt dissection was then made into the retroperitoneal space. Peroneal sac was mobilized and swept anteriorly. Dilator was then passed through the lateral incision and docked upon the L3-4 disc space. Using intraoperative neural monitoring it was determined there were no adjacent nerves. The dilators were sequentially enlarged with continuous monitoring. The self retainer retractor was placed. This was secured in bed and it was expanded. The disc space was inspected directly. Once again there is no evidence of overlying lumbar plexus. The disc space and surrounding areas were directly stimulated and no evidence of occult nerve presence was ascertained. Disc spaces then docked and a shim was placed. Discectomies and performed using various instruments. Contralateral Lee's was performed. Disc space was then  sequentially sized and a 10 mm lordotic implant was found to be most appropriate. A 10 mm lordotic by 22 mm x 55 mm cage was packed with DBX putty and osteo-cell plus. This was then impacted into place and found to be well-positioned both the AP and lateral planes. Retractor and apply her were removed. Final images revealed good position of the cage with improved alignment of spine at the proper upper level. With patient still in the right lateral decubitus position the day had was placed in a neutral position. Entry sites for pedicle screw fixation on the left at L3 and L4 were determined. Stab incisions were made. Jamshidi needles were then introduced into the pedicles of L3 and L4. This was done under continuous neural monitoring. Guidewires were placed. The pedicles were tapped. 6.5 x 45 mm screws were placed on the left-sided L3 and L4. 45 mm rod was then placed through the screw heads and towers. Locking caps were then then applied and secured sequentially. The appliers were removed. Final tightening had been achieved. Final images revealed good position of the cage and pedicle fixation at the proper upper level with normal alignment of the spine. Wounds were then irrigated with and bike solution and then closed in layers. Steri-Strips and sterile dressing were applied. There were no apparent complications. Patient tolerated the procedure well and he returns to the recovery room postop.

## 2016-07-02 NOTE — Anesthesia Preprocedure Evaluation (Signed)
Anesthesia Evaluation  Patient identified by MRN, date of birth, ID band Patient awake    Reviewed: Allergy & Precautions, H&P , NPO status , Patient's Chart, lab work & pertinent test results  Airway Mallampati: II   Neck ROM: full    Dental   Pulmonary sleep apnea , Current Smoker,    breath sounds clear to auscultation       Cardiovascular hypertension, + CAD and + Past MI   Rhythm:regular Rate:Normal  Cleared by cardiologist as low risk.   Neuro/Psych PSYCHIATRIC DISORDERS Anxiety    GI/Hepatic   Endo/Other    Renal/GU      Musculoskeletal  (+) Arthritis ,   Abdominal   Peds  Hematology   Anesthesia Other Findings   Reproductive/Obstetrics                             Anesthesia Physical Anesthesia Plan  ASA: III  Anesthesia Plan: General   Post-op Pain Management:    Induction: Intravenous  Airway Management Planned: Oral ETT  Additional Equipment:   Intra-op Plan:   Post-operative Plan: Extubation in OR  Informed Consent: I have reviewed the patients History and Physical, chart, labs and discussed the procedure including the risks, benefits and alternatives for the proposed anesthesia with the patient or authorized representative who has indicated his/her understanding and acceptance.     Plan Discussed with: CRNA, Anesthesiologist and Surgeon  Anesthesia Plan Comments:         Anesthesia Quick Evaluation

## 2016-07-02 NOTE — Anesthesia Procedure Notes (Signed)
Procedure Name: Intubation Date/Time: 07/02/2016 12:10 PM Performed by: Gaylene Brooks Pre-anesthesia Checklist: Patient identified, Emergency Drugs available, Suction available and Patient being monitored Patient Re-evaluated:Patient Re-evaluated prior to inductionOxygen Delivery Method: Circle System Utilized Preoxygenation: Pre-oxygenation with 100% oxygen Intubation Type: IV induction Laryngoscope Size: Miller and 2 Grade View: Grade I Tube type: Oral Tube size: 7.5 mm Number of attempts: 1 Airway Equipment and Method: Stylet and LTA kit utilized Placement Confirmation: ETT inserted through vocal cords under direct vision,  positive ETCO2 and breath sounds checked- equal and bilateral Secured at: 23 cm Tube secured with: Tape Dental Injury: Teeth and Oropharynx as per pre-operative assessment

## 2016-07-02 NOTE — Anesthesia Postprocedure Evaluation (Signed)
Anesthesia Post Note  Patient: [name] [name]  Procedure(s) Performed: Procedure(s) (LRB): LUMBAR THREE - FOUR ANTERIOR LATERAL LUMBAR FUSION WITH PERCUTANEOUS SCREWS LEFT (N/A)  Patient location during evaluation: PACU Anesthesia Type: General Level of consciousness: awake and alert and patient cooperative Pain management: pain level controlled Vital Signs Assessment: post-procedure vital signs reviewed and stable Respiratory status: spontaneous breathing and respiratory function stable Cardiovascular status: stable Anesthetic complications: no    Last Vitals:  Vitals:   07/02/16 1530 07/02/16 1533  BP:  (!) 133/93  Pulse: 74 71  Resp: 17 15  Temp:      Last Pain:  Vitals:   07/02/16 1518  TempSrc:   PainSc: [place]

## 2016-07-02 NOTE — Progress Notes (Signed)
Patient arrived from [place] via staff. Patient oriented, vitals stable, oriented to room/unit. Continue to monitor progress.

## 2016-07-02 NOTE — Transfer of Care (Signed)
Immediate Anesthesia Transfer of Care Note  Patient: [name] [name]  Procedure(s) Performed: Procedure(s): LUMBAR THREE - FOUR ANTERIOR LATERAL LUMBAR FUSION WITH PERCUTANEOUS SCREWS LEFT (N/A)  Patient Location: PACU  Anesthesia Type:General  Level of Consciousness: awake, alert  and oriented  Airway & Oxygen Therapy: Patient Spontanous Breathing and Patient connected to face mask oxygen  Post-op Assessment: Report given to RN, Post -op Vital signs reviewed and stable and Patient moving all extremities X 4  Post vital signs: Reviewed and stable  Last Vitals:  Vitals:   07/02/16 0904  BP: (!) 102/56  Pulse: 60  Resp: 20  Temp: 37 C    Last Pain:  Vitals:   07/02/16 0904  TempSrc: Oral         Complications: No apparent anesthesia complications

## 2016-07-02 NOTE — Brief Op Note (Signed)
07/02/2016  1:58 PM  PATIENT:  [name] [name]  75 y.o. male  PRE-OPERATIVE DIAGNOSIS:  Stenosis - lumbar  POST-OPERATIVE DIAGNOSIS:  Stenosis - lumbar  PROCEDURE:  Procedure(s): LUMBAR THREE - FOUR ANTERIOR LATERAL LUMBAR FUSION WITH PERCUTANEOUS SCREWS LEFT (N/A)  SURGEON:  Surgeon(s) and Role:    * Earnie Larsson, MD - Primary    * Kary Kos, MD - Assisting  PHYSICIAN ASSISTANT:   ASSISTANTS:    ANESTHESIA:   general  EBL:  Total I/O In: 1000 [I.V.:1000] Out: 675 [Urine:575; Blood:100]  BLOOD ADMINISTERED:none  DRAINS: none   LOCAL MEDICATIONS USED:  MARCAINE     SPECIMEN:  No Specimen  DISPOSITION OF SPECIMEN:  N/A  COUNTS:  YES  TOURNIQUET:  * No tourniquets in log *  DICTATION: .Dragon Dictation  PLAN OF CARE: Admit to inpatient   PATIENT DISPOSITION:  PACU - hemodynamically stable.   Delay start of Pharmacological VTE agent (>24hrs) due to surgical blood loss or risk of bleeding: yes

## 2016-07-02 NOTE — H&P (Signed)
[name] [name] is an 75 y.o. male.   Chief Complaint: Back pain HPI: 75 year old male with severe back pain with radiation into his lower extremities. Patient status post extensive previous decompressive surgery. He has marked multilevel disc degeneration and spondylosis. He has evidence of significant facet diastases and arthropathy worse at L3-4 and this is felt to be his most symptomatic level. The patient is a poor candidate for multilevel fusion and has decided to proceed with L3 for surgery alone in hopes of giving him some relief.  Past Medical History:  Diagnosis Date  . Anxiety    situational  . Bilateral lower extremity edema   . CAD (coronary artery disease) 10/23/14   s/p LAD DES / STEMI with CGS  . Cancer (HCC)    Mantle cell lymphoma  . Dyspnea    with exertion  . ED (erectile dysfunction)   . Hyperlipidemia   . Hypertension   . Insomnia   . Ischemic cardiomyopathy 10/24/14   EF 40-45% by echo  . Obesity    morbid  . OSA (obstructive sleep apnea)    severe, on CPAP 12  . Osteoarthritis   . Peripheral edema   . Restless leg syndrome   . Rhinitis   . Tobacco abuse     Past Surgical History:  Procedure Laterality Date  . BACK SURGERY  10/2009  . CORONARY ANGIOPLASTY    . EYE SURGERY     lazer bil  . HEMORRHOID SURGERY    . INTRA-AORTIC BALLOON PUMP INSERTION  10/23/2014   Procedure: INTRA-AORTIC BALLOON PUMP INSERTION;  Surgeon: Peter M Martinique, MD;  Location: [facility] CATH LAB;  Service: Cardiovascular;;  . LEFT HEART CATHETERIZATION WITH CORONARY ANGIOGRAM N/A 10/23/2014   Procedure: LEFT HEART CATHETERIZATION WITH CORONARY ANGIOGRAM;  Surgeon: Peter M Martinique, MD;  Location: [facility] CATH LAB;  Service: Cardiovascular;  Laterality: N/A;  . LYMPH NODE BIOPSY Right 01/2014   groin area  . PERCUTANEOUS CORONARY STENT INTERVENTION (PCI-S)  10/23/2014   Procedure: PERCUTANEOUS CORONARY STENT INTERVENTION (PCI-S);  Surgeon: Peter M Martinique, MD;  Location: [facility] CATH LAB;  Service:  Cardiovascular;;  . PORTACATH PLACEMENT Right 2015  . ROTATOR CUFF REPAIR  2011  . TOTAL KNEE ARTHROPLASTY     left  . TOTAL KNEE ARTHROPLASTY  08/04/2012   right knee  . TOTAL KNEE ARTHROPLASTY  08/04/2012   Procedure: TOTAL KNEE ARTHROPLASTY;  Surgeon: Yvette Rack., MD;  Location: [place];  Service: Orthopedics;  Laterality: Right;  WITH PATELLA RESURFACING    Family History  Problem Relation Age of Onset  . Coronary artery disease Neg Hx    Social History:  reports that he has been smoking Cigarettes.  He has a 55.00 pack-year smoking history. He has never used smokeless tobacco. He reports that he does not drink alcohol or use drugs.  Allergies:  Allergies  Allergen Reactions  . No Known Allergies     Medications Prior to Admission  Medication Sig Dispense Refill  . acetaminophen (TYLENOL) 500 MG tablet Take 1,000 mg by mouth every 6 (six) hours as needed for headache.    . ANDROGEL PUMP 20.25 MG/ACT (1.62%) GEL Apply 1 application topically daily.    Marland Kitchen aspirin 81 MG chewable tablet Chew 1 tablet (81 mg total) by mouth daily.    . celecoxib (CELEBREX) 200 MG capsule Take 1 capsule by mouth. 1-2 times daily with food as needed for pain  12  . clonazePAM (KLONOPIN) 1 MG tablet Take 2  tablets (2 mg total) by mouth at bedtime. 60 tablet 2  . diclofenac sodium (VOLTAREN) 1 % GEL Apply 4 g topically 4 (four) times daily as needed (for leg soreness). 1 Tube 2  . gabapentin (NEURONTIN) 300 MG capsule Take 300-600 mg by mouth 3 (three) times daily. Take '600mg'$ s in the morning, '300mg'$ s at lunch, and '600mg'$ s at night    . HYDROcodone-acetaminophen (NORCO) 10-325 MG per tablet Take 1 tablet by mouth 4 (four) times daily as needed for moderate pain. For pain  0  . Multiple Vitamin (MULTIVITAMIN WITH MINERALS) TABS tablet Take 2 tablets by mouth daily.    . primidone (MYSOLINE) 50 MG tablet Take 50 mg by mouth 3 (three) times daily as needed. (shakes)  1  . rOPINIRole (REQUIP) 1 MG tablet TAKE  2 TABLETS BY MOUTH AT BEDTIME 60 tablet 0  . ROZEREM 8 MG tablet Take 8 mg by mouth at bedtime as needed. for sleep  5  . tamsulosin (FLOMAX) 0.4 MG CAPS capsule Take 1 capsule (0.4 mg total) by mouth daily. 30 capsule 6  . tiZANidine (ZANAFLEX) 4 MG tablet Take 4 mg to 8 mg by mouth twice daily as needed for muscle spasms.  12  . tobramycin-dexamethasone (TOBRADEX) ophthalmic solution Place 2 drops into both eyes 4 (four) times daily as needed. (eye burning)  2  . nitroGLYCERIN (NITROSTAT) 0.4 MG SL tablet Place 0.4 mg under the tongue every 5 (five) minutes as needed for chest pain (Max 3 doses within 15 minutes. Call 911 at 3rd dose).      Results for orders placed or performed during the hospital encounter of 07/01/16 (from the past 48 hour(s))  Surgical pcr screen     Status: None   Collection Time: 07/01/16  2:31 PM  Result Value Ref Range   MRSA, PCR NEGATIVE NEGATIVE   Staphylococcus aureus NEGATIVE NEGATIVE    Comment:        The Xpert SA Assay (FDA approved for NASAL specimens in patients over 70 years of age), is one component of a comprehensive surveillance program.  Test performance has been validated by [facility] for patients greater than or equal to 66 year old. It is not intended to diagnose infection nor to guide or monitor treatment.   Type and screen     Status: None   Collection Time: 07/01/16  2:42 PM  Result Value Ref Range   ABO/RH(D) B POS    Antibody Screen NEG    Sample Expiration 07/15/2016    Extend sample reason NO TRANSFUSIONS OR PREGNANCY IN THE PAST 3 MONTHS   CBC WITH DIFFERENTIAL     Status: Abnormal   Collection Time: 07/01/16  2:55 PM  Result Value Ref Range   WBC 6.2 4.0 - 10.5 K/uL   RBC 4.51 4.22 - 5.81 MIL/uL   Hemoglobin 14.5 13.0 - 17.0 g/dL   HCT 42.3 39.0 - 52.0 %   MCV 93.8 78.0 - 100.0 fL   MCH 32.2 26.0 - 34.0 pg   MCHC 34.3 30.0 - 36.0 g/dL   RDW 14.0 11.5 - 15.5 %   Platelets 151 150 - 400 K/uL   Neutrophils Relative %  80 %   Neutro Abs 4.9 1.7 - 7.7 K/uL   Lymphocytes Relative 10 %   Lymphs Abs 0.6 (L) 0.7 - 4.0 K/uL   Monocytes Relative 8 %   Monocytes Absolute 0.5 0.1 - 1.0 K/uL   Eosinophils Relative 2 %   Eosinophils Absolute  0.1 0.0 - 0.7 K/uL   Basophils Relative 0 %   Basophils Absolute 0.0 0.0 - 0.1 K/uL  Basic metabolic panel     Status: Abnormal   Collection Time: 07/01/16  2:55 PM  Result Value Ref Range   Sodium 136 135 - 145 mmol/L   Potassium 4.1 3.5 - 5.1 mmol/L   Chloride 105 101 - 111 mmol/L   CO2 23 22 - 32 mmol/L   Glucose, Bld 109 (H) 65 - 99 mg/dL   BUN 13 6 - 20 mg/dL   Creatinine, Ser 1.07 0.61 - 1.24 mg/dL   Calcium 8.8 (L) 8.9 - 10.3 mg/dL   GFR calc non Af Amer >60 >60 mL/min   GFR calc Af Amer >60 >60 mL/min    Comment: (NOTE) The eGFR has been calculated using the CKD EPI equation. This calculation has not been validated in all clinical situations. eGFR's persistently <60 mL/min signify possible Chronic Kidney Disease.    Anion gap 8 5 - 15   No results found.  Pertinent items noted in HPI and remainder of comprehensive ROS otherwise negative.  Blood pressure (!) 102/56, pulse 60, temperature 98.6 F (37 C), temperature source Oral, resp. rate 20, height '5\' 11"'$  (1.803 m), weight (!) 141.1 kg (311 lb), SpO2 99 %.  Patient is awake and alert. He is oriented and appropriate. He is obviously uncomfortable. His speech is fluent. His judgment and insight are intact. Cranial nerve function is intact. Motor and sensory function of the extremities nonfocal. Reflexes hypoactive but symmetric. No evidence of long track signs. Gait and posture antalgic with flexed posture. Examination head ears eyes and throat is unremarkable. Chest and abdomen are benign. Extremities are free from injury deformity. Assessment/Plan L3-4 spondylosis with stenosis. Plan left L3-4 anterior lateral retroperitoneal interbody decompression and fusion with posterior percutaneous pedicle screw  fixation in hopes of improving his symptoms. Risks and benefits of been explained. Patient wishes to proceed.  Cuyler Vandyken A 07/02/2016, 9:37 AM

## 2016-07-03 LAB — GLUCOSE, CAPILLARY
GLUCOSE-CAPILLARY: 165 mg/dL — AB (ref 65–99)
GLUCOSE-CAPILLARY: 125 mg/dL — AB (ref 65–99)
GLUCOSE-CAPILLARY: 110 mg/dL — AB (ref 65–99)
Glucose-Capillary: 120 mg/dL — ABNORMAL HIGH (ref 65–99)

## 2016-07-03 MED ORDER — INFLUENZA VAC SPLIT QUAD 0.5 ML IM SUSY
0.5000 mL | PREFILLED_SYRINGE | INTRAMUSCULAR | Status: AC
  Administered 2016-07-04: 0.5 mL via INTRAMUSCULAR
  Filled 2016-07-03: qty 0.5

## 2016-07-03 NOTE — Progress Notes (Signed)
Foley d/c'd this am, unable to ambulate as brace not here--daughter to bring this AM.  Continue to monitor patient.

## 2016-07-03 NOTE — Evaluation (Signed)
Physical Therapy Evaluation Patient Details Name: [name] [name] MRN: [id number] DOB: [birth year] Today's Date: 07/03/2016   History of Present Illness  Pt is a 75 y.o. male s/p LUMBAR THREE - FOUR ANTERIOR LATERAL LUMBAR FUSION. PMHx: Anxiety, CAD, Cancer, Dyspnea with exertion, HTN, Ischemic cardiomyopathy, Obesity, OSA, Osteoarthritis, bil TKA, Rotator cuff repair, Back sx in 2011.   Clinical Impression  Pt did well - all education complete.  Focus on back precautions and body mechanics and home modification ideas.  Pt ready for DC from PT standpoint.    Follow Up Recommendations No PT follow up (pt agrees he doesnt need any HH follow up)    Equipment Recommendations       Recommendations for Other Services       Precautions / Restrictions Precautions Precautions: Fall;Back Precaution Booklet Issued: Yes (comment) Precaution Comments: Educated pt on back precautions Required Braces or Orthoses: Spinal Brace Spinal Brace: Lumbar corset Restrictions Weight Bearing Restrictions: No Other Position/Activity Restrictions: pt educated on back precautions and body mechanics      Mobility  Bed Mobility               General bed mobility comments: pt sitting EOB - verbally reveiwed correct technique and pt abel to verbalize  Transfers Overall transfer level: Needs assistance Equipment used: Rolling walker (2 wheeled) Transfers: Sit to/from Stand Sit to Stand: Supervision         General transfer comment: pt cued to look up when standing and sitting to keep trunk erect/neutral.  pt did well  Ambulation/Gait Ambulation/Gait assistance: Min guard Ambulation Distance (Feet): 300 Feet Assistive device: Rolling walker (2 wheeled);None Gait Pattern/deviations: Trunk flexed     General Gait Details: pt walked total of 300 feet - first 200 with RW and cues for posture.  last 100 with no device. pt with improved posture but still has habit of 10 degrees of forward  flexion.  pt with no gait deviations.  pt did get SOB - encouraged him not to talk while walking - he likes to talk.  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  Stairs            Wheelchair Mobility    Modified Rankin (Stroke Patients Only)       Balance Overall balance assessment: Needs assistance Sitting-balance support: Feet supported;No upper extremity supported Sitting balance-Leahy Scale: Good     Standing balance support: No upper extremity supported;During functional activity Standing balance-Leahy Scale: Fair                               Pertinent Vitals/Pain Pain Assessment: 0-10 Pain Score: 6  Faces Pain Scale: Hurts little more Pain Location:  (back and both thighs) Pain Descriptors / Indicators: Sore Pain Intervention(s): Monitored during session;Repositioned    Home Living Family/patient expects to be discharged to:: Private residence Living Arrangements: Children Available Help at Discharge: Family;Available 24 hours/day Type of Home: House Home Access: Stairs to enter   [employer] of Steps: 1 Home Layout: One level Home Equipment: Grab bars - toilet;Grab bars - tub/shower;Walker - 2 wheels;Cane - single point      Prior Function Level of Independence: Needs assistance   Gait / Transfers Assistance Needed: cane for mobility  ADL's / Homemaking Assistance Needed: daughter assists with donning socks. and daughters do the cooking  and cleaning  Comments: pt has had back brace since April of last year.  wearing it when he works to help with pain management.  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  pt denies history of falls    Exercises Other Exercises Other Exercises: showed pt mini squats at sink - encouraged him to do this exercise to help strengthen quads to help wtih good body mechanics Other Exercises: discussed walking program and how to progress   Assessment/Plan    PT Assessment Patent does not need any further PT services  PT Problem List            PT Treatment Interventions      PT Goals (Current goals can be found in the Care Plan section)  Acute Rehab PT Goals Patient Stated Goal: home today PT Goal Formulation: With patient Potential to Achieve Goals: Good    Frequency     Barriers to discharge        Co-evaluation               End of Session Equipment Utilized During Treatment: Gait belt;Back brace Activity Tolerance: Patient tolerated treatment well Patient left: in chair;with call bell/phone within reach Nurse Communication: Mobility status         Time: 1230-1300 PT Time Calculation (min) (ACUTE ONLY): 30 min   Charges:   PT Evaluation $PT Eval Low Complexity: 1 Procedure PT Treatments $Gait Training: 8-22 mins   PT G Codes:        Loyal Buba 07/03/2016, 1:14 PM 07/03/2016   Rande Lawman, PT

## 2016-07-03 NOTE — Progress Notes (Signed)
Patient ID: [name] [name], male   DOB: 11/26/1940, 75 y.o.   MRN: [id number] Patient doing well condition of back pain and left thigh pain strength 5 out of 5  Wounds clean dry and intact  Mobilized today with physical occupational therapy.

## 2016-07-03 NOTE — Evaluation (Signed)
Occupational Therapy Evaluation Patient Details Name: [name] [name] MRN: [id number] DOB: 1941/03/01 Today's Date: 07/03/2016    History of Present Illness Pt is a 75 y.o. male s/p LUMBAR THREE - FOUR ANTERIOR LATERAL LUMBAR FUSION. PMHx: Anxiety, CAD, Cancer, Dyspnea with exertion, HTN, Ischemic cardiomyopathy, Obesity, OSA, Osteoarthritis, bil TKA, Rotator cuff repair, Back sx in 2011.    Clinical Impression   Pt reports his daughter assisted with donning socks but was otherwise independent with ADL PTA. Currently pt overall supervision for safety with ADL and functional mobility with the exception of min assist for LB ADL. Began back, safety, and ADL education with pt. Pt with increased SOB during functional activity, SpO2=90% on RA. Educated on deep breathing strategies. Pt planning to d/c home with 24/7 supervision from family. Pt would benefit from continued skilled OT to address established goals.    Follow Up Recommendations  No OT follow up;Supervision/Assistance - 24 hour    Equipment Recommendations  None recommended by OT    Recommendations for Other Services PT consult     Precautions / Restrictions Precautions Precautions: Back Precaution Booklet Issued: Yes (comment) Precaution Comments: Educated pt on back precautions Required Braces or Orthoses: Spinal Brace Spinal Brace: Lumbar corset Restrictions Weight Bearing Restrictions: No      Mobility Bed Mobility               General bed mobility comments: Pt sitting EOB upon arrival.  Transfers Overall transfer level: Needs assistance Equipment used: Rolling walker (2 wheeled) Transfers: Sit to/from Stand Sit to Stand: Supervision         General transfer comment: for safety.    Balance Overall balance assessment: Needs assistance Sitting-balance support: Feet supported;No upper extremity supported Sitting balance-Leahy Scale: Good     Standing balance support: No upper extremity  supported;During functional activity Standing balance-Leahy Scale: Fair                              ADL Overall ADL's : Needs assistance/impaired Eating/Feeding: Independent;Sitting   Grooming: Supervision/safety;Standing   Upper Body Bathing: Supervision/ safety;Sitting   Lower Body Bathing: Supervison/ safety;Sit to/from stand   Upper Body Dressing : Set up;Sitting Upper Body Dressing Details (indicate cue type and reason): Pt reports no difficulties donning back brace Lower Body Dressing: Minimal assistance;Sit to/from stand Lower Body Dressing Details (indicate cue type and reason): Assist to don socks, daughter to assist as needed. Toilet Transfer: Supervision/safety;Ambulation;Comfort height toilet;RW Armed forces technical officer Details (indicate cue type and reason): Simulated by sit to stand from EOB with functional mobility.       Tub/Shower Transfer Details (indicate cue type and reason): Pt unable to perform tub transfer at this time due to bil LE soreness. Discussed sponge bathing initially until he is able to manage tub. Supervision for safety when he is able to perform tub transfers initially. Functional mobility during ADLs: Supervision/safety;Rolling walker General ADL Comments: Educated pt on maintaining back precautions during functional activities, log roll technique for bed mobility. Pt noted to have SOB with functional mobility; SpO2=90% on RA. Educated pt on deep breathing strategies; SpO2 up in mid 90s.     Vision Vision Assessment?: No apparent visual deficits   Perception     Praxis      Pertinent Vitals/Pain Pain Assessment: Faces Faces Pain Scale: Hurts little more Pain Location: back, bil LEs Pain Descriptors / Indicators: Sore Pain Intervention(s): Monitored during session;Repositioned  Hand Dominance     Extremity/Trunk Assessment Upper Extremity Assessment Upper Extremity Assessment: Overall WFL for tasks assessed   Lower Extremity  Assessment Lower Extremity Assessment: Defer to PT evaluation   Cervical / Trunk Assessment Cervical / Trunk Assessment: Other exceptions Cervical / Trunk Exceptions: s/p spinal sx   Communication Communication Communication: No difficulties   Cognition Arousal/Alertness: Awake/alert Behavior During Therapy: WFL for tasks assessed/performed Overall Cognitive Status: Within Functional Limits for tasks assessed                     General Comments       Exercises       Shoulder Instructions      Home Living Family/patient expects to be discharged to:: Private residence Living Arrangements: Children Available Help at Discharge: Family;Available 24 hours/day Type of Home: House Home Access: Stairs to enter [employer] of Steps: 1   Home Layout: One level     Bathroom Shower/Tub: Tub/shower unit Shower/tub characteristics: Curtain Biochemist, clinical: Handicapped height     Home Equipment: Grab bars - toilet;Grab bars - tub/shower;Walker - 2 wheels;Cane - single point          Prior Functioning/Environment Level of Independence: Needs assistance  Gait / Transfers Assistance Needed: cane for mobility ADL's / Homemaking Assistance Needed: daughter assists with donning socks            OT Problem List: Decreased activity tolerance;Impaired balance (sitting and/or standing);Decreased knowledge of precautions;Obesity;Pain   OT Treatment/Interventions: Self-care/ADL training;DME and/or AE instruction;Therapeutic activities;Patient/family education;Balance training    OT Goals(Current goals can be found in the care plan section) Acute Rehab OT Goals Patient Stated Goal: home today OT Goal Formulation: With patient Time For Goal Achievement: 07/17/16 Potential to Achieve Goals: Good ADL Goals Additional ADL Goal #1: Pt will independently verbally recall 3/3 back precautions and maintain throughout ADL. Additional ADL Goal #2: Pt will don/doff back  brace with set up as precursor for ADL/functional mobility. Additional ADL Goal #3: Pt will perform all aspects of bed mobility at mod I level.  OT Frequency: Min 2X/week   Barriers to D/C:            Co-evaluation              End of Session Equipment Utilized During Treatment: Rolling walker;Back brace Nurse Communication: Mobility status  Activity Tolerance: Patient tolerated treatment well Patient left: in chair;with call bell/phone within reach;with nursing/sitter in room   Time: 1209-1224 OT Time Calculation (min): 15 min Charges:  OT General Charges $OT Visit: 1 Procedure OT Evaluation $OT Eval Moderate Complexity: 1 Procedure G-Codes:     Binnie Kand M.S., OTR/L Pager: [phone]  07/03/2016, 12:36 PM

## 2016-07-04 LAB — GLUCOSE, CAPILLARY: Glucose-Capillary: 108 mg/dL — ABNORMAL HIGH (ref 65–99)

## 2016-07-04 MED ORDER — HYDROCODONE-ACETAMINOPHEN 5-325 MG PO TABS: 1.0000 | ORAL_TABLET | ORAL | 0 refills | Status: DC | PRN

## 2016-07-04 NOTE — Progress Notes (Signed)
Pt discharged home with daughter. Discharge instructions were reviewed with pt , and daughter. Pt verbalized understanding.  Arta Silence, RN  07/04/2016 10:41 AM

## 2016-07-04 NOTE — Progress Notes (Signed)
Patient bed alarm going off.Upon entering room patient sitting on side of bed.Bed wet with urine and urine noticed on floor patient urinal on floor.Patient upset wanting to take shower now.Patient does not have shower order explained to patient that he could get bed bath tonight.Patient asking for flomax explained to patient that he already had flomax during day.Patient stated he normally takes flomax at night.Nurst tech to assist patient with bed bath.

## 2016-07-04 NOTE — Discharge Instructions (Signed)
No lifting no bending no twisting no driving °

## 2016-07-04 NOTE — Discharge Summary (Signed)
Physician Discharge Summary  Patient ID: [name] [name] MRN: [id number] DOB/AGE: 1941/07/31 75 y.o.  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 rate 18, height '5\' 11"'$  (1.803 m), weight (!) 143.4 kg (316 lb 3.2 oz), SpO2 95 %. Strength out of 5 wound clean dry and intact  Disposition: home     Medication List    TAKE these medications   acetaminophen 500 MG tablet Commonly known as:  TYLENOL Take 1,000 mg by mouth every 6 (six) hours as needed for headache.   ANDROGEL PUMP 20.25 MG/ACT (1.62%) Gel Generic drug:  Testosterone Apply 1 application topically daily.   aspirin 81 MG chewable tablet Chew 1 tablet (81 mg total) by mouth daily.   celecoxib 200 MG capsule Commonly known as:  CELEBREX Take 1 capsule by mouth. 1-2 times daily with food as needed for pain   clonazePAM 1 MG tablet Commonly known as:  KLONOPIN Take 2 tablets (2 mg total) by mouth at bedtime.   diclofenac sodium 1 % Gel Commonly known as:  VOLTAREN Apply 4 g topically 4 (four) times daily as needed (for leg soreness).   gabapentin 300 MG capsule Commonly known as:  NEURONTIN Take 300-600 mg by mouth 3 (three) times daily. Take '600mg'$ s in the morning, '300mg'$ s at lunch, and '600mg'$ s at night   HYDROcodone-acetaminophen 10-325 MG tablet Commonly known as:  NORCO Take 1 tablet by mouth 4  (four) times daily as needed for moderate pain. For pain What changed:  Another medication with the same name was added. Make sure you understand how and when to take each.   HYDROcodone-acetaminophen 5-325 MG tablet Commonly known as:  NORCO/VICODIN Take 1-2 tablets by mouth every 4 (four) hours as needed (mild pain). What changed:  You were already taking a medication with the same name, and this prescription was added. Make sure you understand how and when to take each.   multivitamin with minerals Tabs tablet Take 2 tablets by mouth daily.   nitroGLYCERIN 0.4 MG SL tablet Commonly known as:  NITROSTAT Place 0.4 mg under the tongue every 5 (five) minutes as needed for chest pain (Max 3 doses within 15 minutes. Call 911 at 3rd dose).   primidone 50 MG tablet Commonly known as:  MYSOLINE Take 50 mg by mouth 3 (three) times daily as needed. (shakes)   rOPINIRole 1 MG tablet Commonly known as:  REQUIP TAKE 2 TABLETS BY MOUTH AT BEDTIME   ROZEREM 8 MG tablet Generic drug:  ramelteon Take 8 mg by mouth at bedtime as needed. for sleep   tamsulosin 0.4 MG Caps capsule Commonly known as:  FLOMAX Take 1 capsule (0.4 mg total) by mouth daily.   tiZANidine 4 MG tablet Commonly known as:  ZANAFLEX Take 4 mg to 8 mg by mouth twice daily as needed for muscle spasms.   tobramycin-dexamethasone ophthalmic solution Commonly known as:  TOBRADEX Place 2 drops into both eyes 4 (four) times daily as needed. (eye burning)  Follow-up Information    POOL,HENRY A, MD Follow up in 3 day(s).   Specialty:  Neurosurgery Contact information: [address] [place] [address] [phone]        Charlie Pitter, MD .   Specialty:  Neurosurgery Contact information: [address] [place] [address] [phone]           Signed: Elaina Hoops 07/04/2016, 8:52 AM

## 2016-07-04 NOTE — Progress Notes (Signed)
Upon entering room saw patient coming from bathroom without back brace on.Asked patient why brace was off.Patient stated,"I had to go to bathroom real quick didn't have time to put it on."Patient was soaked in urine and trail urine was on floor leading to bathroom.This nurse asked patient have seat on bed then noticed bed was wet so asked patient to sit on chair.This nurse went to get supplies to clean patient and bed. Upon entering room patient bending over to pick something up off floor without brace on.This nurse got patient to have seat and applied back brace.Reeducated patient about usage of back brace and no bending and twisting of back.Patient stated." I know all that ."After changing patient clothing and bed assisted patient back in bed.Bed alarm set for safety and call bell within reach asked patient to call when assistance needed for bathroom.Urinal placed on table within reach and patient moved urinal to bed rail.Will continue to monitor.

## 2016-07-04 NOTE — Progress Notes (Signed)
Occupational Therapy Treatment Patient Details Name: [name] [name] MRN: [id number] DOB: 07/22/1941 Today's Date: 07/04/2016    History of present illness Pt is a 75 y.o. male s/p LUMBAR THREE - FOUR ANTERIOR LATERAL LUMBAR FUSION. PMHx: Anxiety, CAD, Cancer, Dyspnea with exertion, HTN, Ischemic cardiomyopathy, Obesity, OSA, Osteoarthritis, bil TKA, Rotator cuff repair, Back sx in 2011.    OT comments  Pt. Seen for final OT tx. Prior to d/c home later today.  Pt. Reports he has his dtr. Available to assist with LB adls.  States he has no need for review of toilet transfer.  Reviewed back precautions and energy conservation strategies with pt.  Eager for d/c home later today.  Had no further questions or concerns.  OTR/L to sign off.    Follow Up Recommendations  No OT follow up;Supervision/Assistance - 24 hour    Equipment Recommendations  None recommended by OT    Recommendations for Other Services      Precautions / Restrictions Precautions Precautions: Fall;Back Precaution Comments: Educated pt on back precautions Required Braces or Orthoses: Spinal Brace Spinal Brace: Lumbar corset Restrictions Weight Bearing Restrictions: No       Mobility Bed Mobility               General bed mobility comments: pt. sitting in recliner prior to arrival in room  Transfers                 General transfer comment: pt. declined transfer practice    Balance                                   ADL Overall ADL's : Needs assistance/impaired               Lower Body Bathing Details (indicate cue type and reason): reviewed compensatory strategies, pt. states his shower is handicap accessible and adapted for him       Lower Body Dressing Details (indicate cue type and reason): states dtr. assists him with LB dressing   Toilet Transfer Details (indicate cue type and reason): pt. declined need for physical review of this transfer, also reports he used a  urinal prior to admit and will also use one at home as needed                  Vision                     Perception     Praxis      Cognition   Behavior During Therapy: [facility] for tasks assessed/performed Overall Cognitive Status: Within Functional Limits for tasks assessed                       Extremity/Trunk Assessment               Exercises     Shoulder Instructions       General Comments  provided examples of energy conservation tech. And and how to incorporate them into daily routine.      Pertinent Vitals/ Pain       Pain Assessment: No/denies pain  Home Living                                          Prior Functioning/Environment  Frequency  Min 2X/week        Progress Toward Goals  OT Goals(current goals can now be found in the care plan section)  Progress towards OT goals: Goals met/education completed, patient discharged from [facility] Discharge plan remains appropriate    Co-evaluation                 End of Session     Activity Tolerance Patient tolerated treatment well   Patient Left in chair;with call bell/phone within reach   Nurse Communication          Time: 6578-4696 OT Time Calculation (min): 9 min  Charges: OT General Charges $OT Visit: 1 Procedure OT Treatments $Self Care/Home Management : 8-22 mins  Janice Coffin, COTA/L 07/04/2016, 9:31 AM

## 2016-07-04 NOTE — Progress Notes (Signed)
Patient ID: [name] [name], male   DOB: 02/09/1941, 75 y.o.   MRN: [id number] Doing well paincontrolled discharge home

## 2016-07-05 ENCOUNTER — Encounter (HOSPITAL_COMMUNITY): Payer: Self-pay | Admitting: Neurosurgery

## 2016-09-04 DIAGNOSIS — G4733 Obstructive sleep apnea (adult) (pediatric): Secondary | ICD-10-CM | POA: Diagnosis not present

## 2016-09-07 DIAGNOSIS — M5416 Radiculopathy, lumbar region: Secondary | ICD-10-CM | POA: Diagnosis not present

## 2016-09-10 ENCOUNTER — Other Ambulatory Visit (INDEPENDENT_AMBULATORY_CARE_PROVIDER_SITE_OTHER): Payer: Self-pay | Admitting: Specialist

## 2016-10-05 DIAGNOSIS — G4733 Obstructive sleep apnea (adult) (pediatric): Secondary | ICD-10-CM | POA: Diagnosis not present

## 2016-10-11 DIAGNOSIS — G4733 Obstructive sleep apnea (adult) (pediatric): Secondary | ICD-10-CM | POA: Diagnosis not present

## 2016-10-27 ENCOUNTER — Ambulatory Visit (HOSPITAL_COMMUNITY)
Admission: RE | Admit: 2016-10-27 | Discharge: 2016-10-27 | Disposition: A | Discharge status: Home / Self Care | Payer: Medicare Other | Source: Ambulatory Visit | Attending: Pulmonary Disease | Admitting: Pulmonary Disease

## 2016-10-27 ENCOUNTER — Other Ambulatory Visit (HOSPITAL_COMMUNITY): Payer: Self-pay | Admitting: Pulmonary Disease

## 2016-10-27 DIAGNOSIS — R2241 Localized swelling, mass and lump, right lower limb: Secondary | ICD-10-CM | POA: Diagnosis not present

## 2016-10-27 DIAGNOSIS — J449 Chronic obstructive pulmonary disease, unspecified: Secondary | ICD-10-CM | POA: Diagnosis not present

## 2016-10-27 DIAGNOSIS — M79604 Pain in right leg: Secondary | ICD-10-CM | POA: Diagnosis not present

## 2016-10-27 DIAGNOSIS — C8590 Non-Hodgkin lymphoma, unspecified, unspecified site: Secondary | ICD-10-CM | POA: Diagnosis not present

## 2016-10-27 DIAGNOSIS — R600 Localized edema: Secondary | ICD-10-CM | POA: Diagnosis not present

## 2016-10-27 DIAGNOSIS — M545 Low back pain: Secondary | ICD-10-CM | POA: Diagnosis not present

## 2016-11-02 DIAGNOSIS — G4733 Obstructive sleep apnea (adult) (pediatric): Secondary | ICD-10-CM | POA: Diagnosis not present

## 2016-12-03 DIAGNOSIS — G4733 Obstructive sleep apnea (adult) (pediatric): Secondary | ICD-10-CM | POA: Diagnosis not present

## 2017-01-02 DIAGNOSIS — G4733 Obstructive sleep apnea (adult) (pediatric): Secondary | ICD-10-CM | POA: Diagnosis not present

## 2017-01-13 DIAGNOSIS — G4733 Obstructive sleep apnea (adult) (pediatric): Secondary | ICD-10-CM | POA: Diagnosis not present

## 2017-01-25 DIAGNOSIS — J449 Chronic obstructive pulmonary disease, unspecified: Secondary | ICD-10-CM | POA: Diagnosis not present

## 2017-01-25 DIAGNOSIS — I2510 Atherosclerotic heart disease of native coronary artery without angina pectoris: Secondary | ICD-10-CM | POA: Diagnosis not present

## 2017-01-25 DIAGNOSIS — M545 Low back pain: Secondary | ICD-10-CM | POA: Diagnosis not present

## 2017-02-02 DIAGNOSIS — G4733 Obstructive sleep apnea (adult) (pediatric): Secondary | ICD-10-CM | POA: Diagnosis not present

## 2017-03-03 ENCOUNTER — Ambulatory Visit (INDEPENDENT_AMBULATORY_CARE_PROVIDER_SITE_OTHER): Payer: Medicare Other | Admitting: Internal Medicine

## 2017-03-03 ENCOUNTER — Encounter: Payer: Self-pay | Admitting: Internal Medicine

## 2017-03-03 VITALS — BP 129/73 | HR 67 | Ht 71.0 in | Wt 310.8 lb

## 2017-03-03 DIAGNOSIS — R600 Localized edema: Secondary | ICD-10-CM

## 2017-03-03 DIAGNOSIS — L989 Disorder of the skin and subcutaneous tissue, unspecified: Secondary | ICD-10-CM

## 2017-03-03 DIAGNOSIS — I5022 Chronic systolic (congestive) heart failure: Secondary | ICD-10-CM | POA: Diagnosis not present

## 2017-03-03 DIAGNOSIS — R0602 Shortness of breath: Secondary | ICD-10-CM

## 2017-03-03 DIAGNOSIS — E785 Hyperlipidemia, unspecified: Secondary | ICD-10-CM | POA: Diagnosis not present

## 2017-03-03 DIAGNOSIS — Z79899 Other long term (current) drug therapy: Secondary | ICD-10-CM

## 2017-03-03 DIAGNOSIS — I255 Ischemic cardiomyopathy: Secondary | ICD-10-CM

## 2017-03-03 NOTE — Patient Instructions (Addendum)
Your physician has requested that you have an echocardiogram @ [address] - [address]rd [address]. Echocardiography is a painless test that uses sound waves to create images of your heart. It provides your doctor with information about the size and shape of your heart and how well your heart's chambers and valves are working. This procedure takes approximately one hour. There are no restrictions for this procedure.  Your physician recommends that you return for lab work FASTING (CMET, pro-BNP, lipid)  You have been referred to Dr. Jarome Matin @ [facility]   Your physician wants you to follow-up in: 6 months with Dr. Debara Pickett. You will receive a reminder letter in the mail two months in advance. If you don't receive a letter, please call our office to schedule the follow-up appointment.
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
## 2017-03-04 DIAGNOSIS — G4733 Obstructive sleep apnea (adult) (pediatric): Secondary | ICD-10-CM | POA: Diagnosis not present

## 2017-03-08 NOTE — Addendum Note (Signed)
Addended by: Leland Johns A on: 03/08/2017 10:37 AM   Modules accepted: Orders

## 2017-03-30 ENCOUNTER — Ambulatory Visit (HOSPITAL_COMMUNITY): Payer: Medicare Other | Attending: Cardiology

## 2017-03-30 ENCOUNTER — Other Ambulatory Visit: Payer: Self-pay

## 2017-03-30 DIAGNOSIS — I10 Essential (primary) hypertension: Secondary | ICD-10-CM | POA: Insufficient documentation

## 2017-03-30 DIAGNOSIS — J449 Chronic obstructive pulmonary disease, unspecified: Secondary | ICD-10-CM | POA: Insufficient documentation

## 2017-03-30 DIAGNOSIS — I255 Ischemic cardiomyopathy: Secondary | ICD-10-CM | POA: Diagnosis not present

## 2017-03-30 DIAGNOSIS — E785 Hyperlipidemia, unspecified: Secondary | ICD-10-CM | POA: Insufficient documentation

## 2017-03-30 DIAGNOSIS — I081 Rheumatic disorders of both mitral and tricuspid valves: Secondary | ICD-10-CM | POA: Diagnosis not present

## 2017-04-04 DIAGNOSIS — G4733 Obstructive sleep apnea (adult) (pediatric): Secondary | ICD-10-CM | POA: Diagnosis not present

## 2017-04-27 DIAGNOSIS — I2510 Atherosclerotic heart disease of native coronary artery without angina pectoris: Secondary | ICD-10-CM | POA: Diagnosis not present

## 2017-04-27 DIAGNOSIS — G4733 Obstructive sleep apnea (adult) (pediatric): Secondary | ICD-10-CM | POA: Diagnosis not present

## 2017-04-27 DIAGNOSIS — J449 Chronic obstructive pulmonary disease, unspecified: Secondary | ICD-10-CM | POA: Diagnosis not present

## 2017-05-05 DIAGNOSIS — G4733 Obstructive sleep apnea (adult) (pediatric): Secondary | ICD-10-CM | POA: Diagnosis not present

## 2017-05-13 ENCOUNTER — Emergency Department (HOSPITAL_COMMUNITY)
Admission: EM | Admit: 2017-05-13 | Discharge: 2017-05-13 | Disposition: A | Discharge status: Home / Self Care | Payer: Medicare Other | Attending: Emergency Medicine | Admitting: Emergency Medicine

## 2017-05-13 ENCOUNTER — Emergency Department (HOSPITAL_COMMUNITY): Payer: Medicare Other

## 2017-05-13 ENCOUNTER — Ambulatory Visit (HOSPITAL_COMMUNITY)
Admission: EM | Admit: 2017-05-13 | Discharge: 2017-05-13 | Disposition: A | Discharge status: Home / Self Care | Payer: Medicare Other | Source: Home / Self Care

## 2017-05-13 ENCOUNTER — Encounter (HOSPITAL_COMMUNITY): Payer: Self-pay | Admitting: Emergency Medicine

## 2017-05-13 ENCOUNTER — Encounter (HOSPITAL_COMMUNITY): Payer: Self-pay

## 2017-05-13 DIAGNOSIS — R0602 Shortness of breath: Secondary | ICD-10-CM | POA: Diagnosis not present

## 2017-05-13 DIAGNOSIS — Z5321 Procedure and treatment not carried out due to patient leaving prior to being seen by health care provider: Secondary | ICD-10-CM | POA: Insufficient documentation

## 2017-05-13 DIAGNOSIS — I4819 Other persistent atrial fibrillation: Secondary | ICD-10-CM

## 2017-05-13 DIAGNOSIS — I481 Persistent atrial fibrillation: Secondary | ICD-10-CM

## 2017-05-13 DIAGNOSIS — R600 Localized edema: Secondary | ICD-10-CM

## 2017-05-13 DIAGNOSIS — R2241 Localized swelling, mass and lump, right lower limb: Secondary | ICD-10-CM | POA: Diagnosis not present

## 2017-05-13 LAB — BASIC METABOLIC PANEL
Anion gap: 9 (ref 5–15)
BUN: 14 mg/dL (ref 6–20)
CALCIUM: 9.2 mg/dL (ref 8.9–10.3)
CO2: 24 mmol/L (ref 22–32)
CREATININE: 1.05 mg/dL (ref 0.61–1.24)
Chloride: 102 mmol/L (ref 101–111)
GFR calc non Af Amer: >60 mL/min (ref >60)
GLUCOSE: 104 mg/dL — AB (ref 65–99)
Potassium: 4.4 mmol/L (ref 3.5–5.1)
Sodium: 135 mmol/L (ref 135–145)

## 2017-05-13 LAB — I-STAT TROPONIN, ED: TROPONIN I, POC: 0.01 ng/mL (ref 0.00–0.08)

## 2017-05-13 LAB — CBC
HCT: 43.9 % (ref 39.0–52.0)
Hemoglobin: 14.5 g/dL (ref 13.0–17.0)
MCH: 31.7 pg (ref 26.0–34.0)
MCHC: 33 g/dL (ref 30.0–36.0)
MCV: 95.9 fL (ref 78.0–100.0)
PLATELETS: 164 10*3/uL (ref 150–400)
RBC: 4.58 MIL/uL (ref 4.22–5.81)
RDW: 15.4 % (ref 11.5–15.5)
WBC: 4.6 10*3/uL (ref 4.0–10.5)

## 2017-05-13 NOTE — ED Triage Notes (Signed)
Pt presents to the ed from urgent care. Reports right leg swelling and shortness of breath x 1 week. At urgent care he was found to be in atrial fib and they are concerned for a blood clot.  Edp notified.

## 2017-05-13 NOTE — ED Notes (Signed)
Per dr. Leonette Monarch no further orders from order set to be done.

## 2017-05-13 NOTE — ED Provider Notes (Signed)
[name]   [id number] 05/13/17 Arrival Time: 1229   SUBJECTIVE:  COLESON [name] is a 76 y.o. male who presents to the urgent care with complaint of   RLE swelling, pain and warm to the touch onset 1.5 week associated w/SOB  Denies inj/trauma  A&O x4... NAD... Ambulatory      Past Medical History:  Diagnosis Date  . Anxiety    situational  . Bilateral lower extremity edema   . CAD (coronary artery disease) 10/23/14   s/p LAD DES / STEMI with CGS  . Cancer (HCC)    Mantle cell lymphoma  . Dyspnea    with exertion  . ED (erectile dysfunction)   . Hyperlipidemia   . Hypertension   . Insomnia   . Ischemic cardiomyopathy 10/24/14   EF 40-45% by echo  . Obesity    morbid  . OSA (obstructive sleep apnea)    severe, on CPAP 12  . Osteoarthritis   . Peripheral edema   . Restless leg syndrome   . Rhinitis   . Tobacco abuse    Family History  Problem Relation Age of Onset  . Coronary artery disease Neg Hx    Social History   Social History  . Marital status: Widowed    Spouse name: N/A  . Number of children: 2  . Years of education: N/A   Occupational History  . Disabled, Retired Dealer   . RETIRED Retired   Social History Main Topics  . Smoking status: Current Every Day Smoker    Packs/day: 1.00    Years: 55.00    Types: Cigarettes  . Smokeless tobacco: Never Used     Comment: <1 ppd  . Alcohol use No  . Drug use: No  . Sexual activity: Not on file   Other Topics Concern  . Not on file   Social History Narrative  . No narrative on file   Current Meds  Medication Sig  . ANDROGEL PUMP 20.25 MG/ACT (1.62%) GEL Apply 1 application topically daily.  Marland Kitchen aspirin 81 MG chewable tablet Chew 1 tablet (81 mg total) by mouth daily.  . celecoxib (CELEBREX) 200 MG capsule Take 1 capsule by mouth. 1-2 times daily with food as needed for pain  . clonazePAM (KLONOPIN) 1 MG tablet Take 2 tablets (2 mg total) by mouth at bedtime.  . diclofenac  sodium (VOLTAREN) 1 % GEL Apply 4 g topically 4 (four) times daily as needed (for leg soreness).  . gabapentin (NEURONTIN) 300 MG capsule Take 300-600 mg by mouth 3 (three) times daily. Take 600mg s in the morning, 300mg s at lunch, and 600mg s at night  . HYDROcodone-acetaminophen (NORCO) 10-325 MG per tablet Take 1 tablet by mouth 4 (four) times daily as needed for moderate pain. For pain  . HYDROcodone-acetaminophen (NORCO/VICODIN) 5-325 MG tablet Take 1-2 tablets by mouth every 4 (four) hours as needed (mild pain).  . Multiple Vitamin (MULTIVITAMIN WITH MINERALS) TABS tablet Take 2 tablets by mouth daily.  Marland Kitchen rOPINIRole (REQUIP) 1 MG tablet TAKE 2 TABLETS BY MOUTH AT BEDTIME  . ROZEREM 8 MG tablet Take 8 mg by mouth at bedtime as needed. for sleep  . tamsulosin (FLOMAX) 0.4 MG CAPS capsule Take 1 capsule (0.4 mg total) by mouth daily.  Marland Kitchen tiZANidine (ZANAFLEX) 4 MG tablet Take 4 mg to 8 mg by mouth twice daily as needed for muscle spasms.   Allergies  Allergen Reactions  . No Known Allergies       ROS:  As per HPI, remainder of ROS negative.   OBJECTIVE:   Vitals:   05/13/17 1335  BP: 133/71  Pulse: 76  Resp: (!) 26  Temp: 98.7 F (37.1 C)  TempSrc: Oral  SpO2: 96%     General appearance: alert; no distress Eyes: PERRL; EOMI; conjunctiva normal HENT: normocephalic; atraumatic; TMs normal, canal normal, external ears normal without trauma; nasal mucosa normal; oral mucosa normal Neck: supple Lungs: bibasilar rales, worse on right; wheezes on expiration, prolonged expiratory phase. Heart: irregular rate and rhythm, no murmur Abdomen: soft, non-tender; bowel sounds normal; no masses or organomegaly; no guarding or rebound tenderness Back: no CVA tenderness Extremities: no cyanosis ; asymmetrical with more edema on the right Skin: warm and dry, large plaque with surrounding erythema on right medial lower leg Neurologic: normal gait; grossly normal Psychological: alert and  cooperative; normal mood and affect   EKG: atrial fibrillation   Labs:  Results for orders placed or performed during the hospital encounter of 07/02/16  Glucose, capillary  Result Value Ref Range   Glucose-Capillary 163 (H) 65 - 99 mg/dL  Glucose, capillary  Result Value Ref Range   Glucose-Capillary 165 (H) 65 - 99 mg/dL   Comment 1 Notify RN    Comment 2 Document in Chart   Glucose, capillary  Result Value Ref Range   Glucose-Capillary 125 (H) 65 - 99 mg/dL   Comment 1 Notify RN    Comment 2 Document in Chart   Glucose, capillary  Result Value Ref Range   Glucose-Capillary 110 (H) 65 - 99 mg/dL  Glucose, capillary  Result Value Ref Range   Glucose-Capillary 120 (H) 65 - 99 mg/dL   Comment 1 Notify RN    Comment 2 Document in Chart   Glucose, capillary  Result Value Ref Range   Glucose-Capillary 108 (H) 65 - 99 mg/dL   Comment 1 Notify RN    Comment 2 Document in Chart     Labs Reviewed - No data to display  No results found.     ASSESSMENT & PLAN:  No diagnosis found.  No orders of the defined types were placed in this encounter.   Reviewed expectations re: course of current medical issues. Questions answered. Outlined signs and symptoms indicating need for more acute intervention. Patient verbalized understanding. After Visit Summary given.    Your new irregular heart beat needs further evaluation and treatment in the emergency room.  You may have developed a clot in your legs which also needs evaluation in the emergency room     Robyn Haber, MD 05/13/17 1409

## 2017-05-13 NOTE — Discharge Instructions (Signed)
Your new irregular heart beat needs further evaluation and treatment in the emergency room.  You may have developed a clot in your legs which also needs evaluation in the emergency room

## 2017-05-13 NOTE — ED Triage Notes (Signed)
Pt here for for RLE swelling, pain and warm to the touch onset 1.5 week associated w/SOB  Denies inj/trauma  A&O x4... NAD... Ambulatory

## 2017-05-16 ENCOUNTER — Ambulatory Visit (HOSPITAL_COMMUNITY)
Admission: RE | Admit: 2017-05-16 | Discharge: 2017-05-16 | Disposition: A | Discharge status: Home / Self Care | Payer: Medicare Other | Source: Ambulatory Visit | Attending: Cardiovascular Disease | Admitting: Cardiovascular Disease

## 2017-05-16 ENCOUNTER — Telehealth: Payer: Self-pay | Admitting: Internal Medicine

## 2017-05-16 ENCOUNTER — Encounter (HOSPITAL_COMMUNITY): Payer: Self-pay | Admitting: Physician Assistant

## 2017-05-16 ENCOUNTER — Ambulatory Visit (INDEPENDENT_AMBULATORY_CARE_PROVIDER_SITE_OTHER): Payer: Self-pay | Admitting: Physician Assistant

## 2017-05-16 ENCOUNTER — Observation Stay (HOSPITAL_COMMUNITY): Payer: Medicare Other

## 2017-05-16 ENCOUNTER — Encounter: Payer: Self-pay | Admitting: Physician Assistant

## 2017-05-16 ENCOUNTER — Inpatient Hospital Stay (HOSPITAL_COMMUNITY)
Admission: AD | Admit: 2017-05-16 | Discharge: 2017-05-18 | DRG: 299 | Discharge status: Against Medical Advice | Payer: Medicare Other | Source: Ambulatory Visit | Attending: Cardiovascular Disease | Admitting: Cardiovascular Disease

## 2017-05-16 VITALS — BP 133/78 | HR 82 | Ht 71.2 in | Wt 319.0 lb

## 2017-05-16 DIAGNOSIS — I872 Venous insufficiency (chronic) (peripheral): Secondary | ICD-10-CM | POA: Diagnosis present

## 2017-05-16 DIAGNOSIS — F17210 Nicotine dependence, cigarettes, uncomplicated: Secondary | ICD-10-CM | POA: Diagnosis present

## 2017-05-16 DIAGNOSIS — I824Z3 Acute embolism and thrombosis of unspecified deep veins of distal lower extremity, bilateral: Secondary | ICD-10-CM | POA: Diagnosis not present

## 2017-05-16 DIAGNOSIS — I481 Persistent atrial fibrillation: Secondary | ICD-10-CM

## 2017-05-16 DIAGNOSIS — I82409 Acute embolism and thrombosis of unspecified deep veins of unspecified lower extremity: Secondary | ICD-10-CM | POA: Diagnosis present

## 2017-05-16 DIAGNOSIS — I255 Ischemic cardiomyopathy: Secondary | ICD-10-CM | POA: Diagnosis not present

## 2017-05-16 DIAGNOSIS — I110 Hypertensive heart disease with heart failure: Secondary | ICD-10-CM | POA: Diagnosis not present

## 2017-05-16 DIAGNOSIS — Z955 Presence of coronary angioplasty implant and graft: Secondary | ICD-10-CM | POA: Diagnosis not present

## 2017-05-16 DIAGNOSIS — I2510 Atherosclerotic heart disease of native coronary artery without angina pectoris: Secondary | ICD-10-CM | POA: Diagnosis present

## 2017-05-16 DIAGNOSIS — I340 Nonrheumatic mitral (valve) insufficiency: Secondary | ICD-10-CM | POA: Diagnosis present

## 2017-05-16 DIAGNOSIS — I4819 Other persistent atrial fibrillation: Secondary | ICD-10-CM

## 2017-05-16 DIAGNOSIS — I5021 Acute systolic (congestive) heart failure: Secondary | ICD-10-CM | POA: Diagnosis not present

## 2017-05-16 DIAGNOSIS — Z79899 Other long term (current) drug therapy: Secondary | ICD-10-CM | POA: Diagnosis not present

## 2017-05-16 DIAGNOSIS — I82403 Acute embolism and thrombosis of unspecified deep veins of lower extremity, bilateral: Principal | ICD-10-CM | POA: Diagnosis present

## 2017-05-16 DIAGNOSIS — Z96653 Presence of artificial knee joint, bilateral: Secondary | ICD-10-CM | POA: Diagnosis not present

## 2017-05-16 DIAGNOSIS — M7989 Other specified soft tissue disorders: Secondary | ICD-10-CM

## 2017-05-16 DIAGNOSIS — Z7982 Long term (current) use of aspirin: Secondary | ICD-10-CM | POA: Diagnosis not present

## 2017-05-16 DIAGNOSIS — Z6841 Body Mass Index (BMI) 40.0 and over, adult: Secondary | ICD-10-CM

## 2017-05-16 DIAGNOSIS — I252 Old myocardial infarction: Secondary | ICD-10-CM | POA: Diagnosis not present

## 2017-05-16 DIAGNOSIS — Z981 Arthrodesis status: Secondary | ICD-10-CM | POA: Diagnosis not present

## 2017-05-16 DIAGNOSIS — E785 Hyperlipidemia, unspecified: Secondary | ICD-10-CM | POA: Diagnosis not present

## 2017-05-16 DIAGNOSIS — G4733 Obstructive sleep apnea (adult) (pediatric): Secondary | ICD-10-CM | POA: Diagnosis present

## 2017-05-16 DIAGNOSIS — F419 Anxiety disorder, unspecified: Secondary | ICD-10-CM | POA: Diagnosis present

## 2017-05-16 DIAGNOSIS — R0602 Shortness of breath: Secondary | ICD-10-CM | POA: Diagnosis not present

## 2017-05-16 DIAGNOSIS — I700 Atherosclerosis of aorta: Secondary | ICD-10-CM | POA: Diagnosis not present

## 2017-05-16 DIAGNOSIS — F17200 Nicotine dependence, unspecified, uncomplicated: Secondary | ICD-10-CM

## 2017-05-16 HISTORY — DX: Other persistent atrial fibrillation: I48.19

## 2017-05-16 LAB — CBC WITH DIFFERENTIAL/PLATELET
BASOS ABS: 0 10*3/uL (ref 0.0–0.1)
Basophils Relative: 0 %
Eosinophils Absolute: 0.1 10*3/uL (ref 0.0–0.7)
Eosinophils Relative: 1 %
HEMATOCRIT: 40.6 % (ref 39.0–52.0)
HEMOGLOBIN: 13.5 g/dL (ref 13.0–17.0)
LYMPHS ABS: 0.6 10*3/uL — AB (ref 0.7–4.0)
LYMPHS PCT: 12 %
MCH: 31.6 pg (ref 26.0–34.0)
MCHC: 33.3 g/dL (ref 30.0–36.0)
MCV: 95.1 fL (ref 78.0–100.0)
Monocytes Absolute: 0.4 10*3/uL (ref 0.1–1.0)
Monocytes Relative: 7 %
NEUTROS ABS: 3.9 10*3/uL (ref 1.7–7.7)
NEUTROS PCT: 80 %
PLATELETS: 159 10*3/uL (ref 150–400)
RBC: 4.27 MIL/uL (ref 4.22–5.81)
RDW: 15.6 % — ABNORMAL HIGH (ref 11.5–15.5)
WBC: 4.9 10*3/uL (ref 4.0–10.5)

## 2017-05-16 LAB — COMPREHENSIVE METABOLIC PANEL
ALBUMIN: 3.8 g/dL (ref 3.5–5.0)
ALK PHOS: 86 U/L (ref 38–126)
ALT: 20 U/L (ref 17–63)
ANION GAP: 9 (ref 5–15)
AST: 20 U/L (ref 15–41)
BILIRUBIN TOTAL: 1.5 mg/dL — AB (ref 0.3–1.2)
BUN: 14 mg/dL (ref 6–20)
CALCIUM: 8.7 mg/dL — AB (ref 8.9–10.3)
CO2: 23 mmol/L (ref 22–32)
Chloride: 103 mmol/L (ref 101–111)
Creatinine, Ser: 1.02 mg/dL (ref 0.61–1.24)
GFR calc Af Amer: >60 mL/min (ref >60)
GFR calc non Af Amer: >60 mL/min (ref >60)
GLUCOSE: 146 mg/dL — AB (ref 65–99)
Potassium: 3.9 mmol/L (ref 3.5–5.1)
SODIUM: 135 mmol/L (ref 135–145)
TOTAL PROTEIN: 6.3 g/dL — AB (ref 6.5–8.1)

## 2017-05-16 LAB — BRAIN NATRIURETIC PEPTIDE: B NATRIURETIC PEPTIDE 5: 652 pg/mL — AB (ref 0.0–100.0)

## 2017-05-16 LAB — TSH: TSH: 1.015 u[IU]/mL (ref 0.350–4.500)

## 2017-05-16 LAB — PROTIME-INR
INR: 1.09
Prothrombin Time: 14 seconds (ref 11.4–15.2)

## 2017-05-16 MED ORDER — TAMSULOSIN HCL 0.4 MG PO CAPS
0.4000 mg | ORAL_CAPSULE | Freq: Every day | ORAL | Status: DC
  Administered 2017-05-17 – 2017-05-18 (×2): 0.4 mg via ORAL
  Filled 2017-05-16 (×2): qty 1

## 2017-05-16 MED ORDER — ALPRAZOLAM 0.25 MG PO TABS: 0.2500 mg | ORAL_TABLET | Freq: Two times a day (BID) | ORAL | Status: DC | PRN

## 2017-05-16 MED ORDER — FUROSEMIDE 10 MG/ML IJ SOLN
40.0000 mg | Freq: Two times a day (BID) | INTRAMUSCULAR | Status: DC
  Administered 2017-05-17: 40 mg via INTRAVENOUS
  Filled 2017-05-16: qty 4

## 2017-05-16 MED ORDER — ACETAMINOPHEN 325 MG PO TABS: 650.0000 mg | ORAL_TABLET | ORAL | Status: DC | PRN

## 2017-05-16 MED ORDER — ONDANSETRON HCL 4 MG/2ML IJ SOLN: 4.0000 mg | Freq: Four times a day (QID) | INTRAMUSCULAR | Status: DC | PRN

## 2017-05-16 MED ORDER — RIVAROXABAN 15 MG PO TABS
15.0000 mg | ORAL_TABLET | Freq: Two times a day (BID) | ORAL | Status: DC
  Administered 2017-05-16 – 2017-05-18 (×4): 15 mg via ORAL
  Filled 2017-05-16 (×4): qty 1

## 2017-05-16 MED ORDER — ROPINIROLE HCL 1 MG PO TABS
2.0000 mg | ORAL_TABLET | Freq: Every day | ORAL | Status: DC
  Administered 2017-05-16 – 2017-05-17 (×2): 2 mg via ORAL
  Filled 2017-05-16 (×3): qty 2

## 2017-05-16 MED ORDER — ZOLPIDEM TARTRATE 5 MG PO TABS: 5.0000 mg | ORAL_TABLET | Freq: Every evening | ORAL | Status: DC | PRN

## 2017-05-16 MED ORDER — IOPAMIDOL (ISOVUE-370) INJECTION 76%
INTRAVENOUS | Status: AC
  Administered 2017-05-16: 100 mL
  Filled 2017-05-16: qty 100

## 2017-05-16 MED ORDER — CLONAZEPAM 0.5 MG PO TABS
2.0000 mg | ORAL_TABLET | Freq: Every day | ORAL | Status: DC
  Administered 2017-05-16 – 2017-05-17 (×2): 2 mg via ORAL
  Filled 2017-05-16 (×2): qty 4

## 2017-05-16 MED ORDER — NITROGLYCERIN 0.4 MG SL SUBL: 0.4000 mg | SUBLINGUAL_TABLET | SUBLINGUAL | Status: DC | PRN

## 2017-05-16 MED ORDER — FUROSEMIDE 20 MG PO TABS: 20.0000 mg | ORAL_TABLET | Freq: Every day | ORAL | 5 refills | Status: DC

## 2017-05-16 MED ORDER — RIVAROXABAN 20 MG PO TABS: 20.0000 mg | ORAL_TABLET | Freq: Every day | ORAL | Status: DC

## 2017-05-16 MED ORDER — RAMELTEON 8 MG PO TABS
8.0000 mg | ORAL_TABLET | Freq: Every evening | ORAL | Status: DC | PRN
  Administered 2017-05-16: 8 mg via ORAL
  Filled 2017-05-16 (×2): qty 1

## 2017-05-16 NOTE — Progress Notes (Signed)
CT called- 20g IV in forearm and creatinine levels before CT angio.

## 2017-05-16 NOTE — Telephone Encounter (Signed)
Spoke to patient. Patient states he did go to ER but left AFTER 5 HOURS did not receive treatment for afib,or right leg swelling.  Informed patient EKG was irregular needs follow up.   appointment schedule for today with extender 2 pm and venous DVT doppler if needed 4:30 pm ( order needs to be placed.

## 2017-05-16 NOTE — Telephone Encounter (Signed)
[name] [name] is calling because he went to [facility] on Friday and they said he had an irregular heart beat , but he is wanting Dr. Debara Pickett or his nurse to check the EKG to see if they are correct and then give him a call to tell him what to do . He went there for right leg swelling .Marland Kitchen... Please call .Marland Kitchen Thanks

## 2017-05-16 NOTE — Progress Notes (Signed)
Cardiology Office Note   Date:  05/16/2017   ID:  ANOTHONY [name], DOB June 30, 1941, MRN [id number]  PCP:  Sinda Du, MD  Cardiologist:  Dr. Debara Pickett, 03/03/2017  [name] Ferries, PA-C   Chief Complaint  Patient presents with  . new AF? leg swelling    History of Present Illness: [name] [name] is a 76 y.o. 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   [facility] visit 05/13/2017 for right lower extremity swelling, pain and erythema for about a week and a half, this was associated with shortness of breath. ECG showed atrial fibrillation, heart rate 90. Patient was sent to the ER but left before being seen after waiting 5 hours.  09/17 Phone notes regarding above symptoms, appointment made Pt had appt at [name] for   [name] presents for cardiology evaluation.  His RLE has always swollen more than the L. He wakes with some edema, it gets worse during the day. When he lays down at night, it hurts more. He has an area of abnormal skin on his shin, but it is not open or draining. He was scheduled to see a dermatologist. He said that when he went into the office he could tell they were not for him and he left without being seen. He is unclear on exactly when this was. He has been I'm able to get on any compression socks or stockings. He has not had any numbness or tingling.  He has no awareness of the atrial fib. He denies presyncope or syncope. Not aware of any rapid or irregular HR.    He has chronic dyspnea on exertion, is not sure that it has changed recently. He has some orthopnea, but denies PND. He feels the orthopnea is related to body habitus. He does not weigh himself daily. His daughter cooks the food, but he does not know if she  pays attention to the amount of sodium in it.   He has not had chest pain with exertion. That said, his exertion level has been poor for a while. He admits that he walks very little. He cannot do steps well.   Past Medical History:  Diagnosis Date  . Anxiety    situational  . Bilateral lower extremity edema   . CAD (coronary artery disease) 10/23/14   s/p LAD DES / STEMI with CGS  . Cancer (HCC)    Mantle cell lymphoma  . Dyspnea    with exertion  . ED (erectile dysfunction)   . Hyperlipidemia   . Hypertension   . Insomnia   . Ischemic cardiomyopathy 10/24/14   EF 40-45% by echo  . Obesity    morbid  . OSA (obstructive sleep apnea)    severe, on CPAP 12  . Osteoarthritis   . Peripheral edema   . Restless leg syndrome   . Rhinitis   . Tobacco abuse     Past Surgical History:  Procedure Laterality Date  . ANTERIOR LAT LUMBAR FUSION N/A 07/02/2016   Procedure: LUMBAR THREE - FOUR ANTERIOR LATERAL LUMBAR FUSION WITH PERCUTANEOUS SCREWS LEFT;  Surgeon: Earnie Larsson, MD;  Location: [facility];  Service: Neurosurgery;  Laterality: N/A;  . BACK SURGERY  10/2009  . CORONARY ANGIOPLASTY    .  EYE SURGERY     lazer bil  . HEMORRHOID SURGERY    . INTRA-AORTIC BALLOON PUMP INSERTION  10/23/2014   Procedure: INTRA-AORTIC BALLOON PUMP INSERTION;  Surgeon: Peter M Martinique, MD;  Location: [facility] CATH LAB;  Service: Cardiovascular;;  . LEFT HEART CATHETERIZATION WITH CORONARY ANGIOGRAM N/A 10/23/2014   Procedure: LEFT HEART CATHETERIZATION WITH CORONARY ANGIOGRAM;  Surgeon: Peter M Martinique, MD;  Location: [facility] CATH LAB;  Service: Cardiovascular;  Laterality: N/A;  . LYMPH NODE BIOPSY Right 01/2014   groin area  . PERCUTANEOUS CORONARY STENT INTERVENTION (PCI-S)  10/23/2014   Procedure: PERCUTANEOUS CORONARY STENT INTERVENTION (PCI-S);  Surgeon: Peter M Martinique, MD;  Location: [facility] CATH LAB;  Service: Cardiovascular;;  . PORTACATH PLACEMENT Right 2015  . ROTATOR CUFF REPAIR  2011  . TOTAL KNEE ARTHROPLASTY       left  . TOTAL KNEE ARTHROPLASTY  08/04/2012   right knee  . TOTAL KNEE ARTHROPLASTY  08/04/2012   Procedure: TOTAL KNEE ARTHROPLASTY;  Surgeon: Yvette Rack., MD;  Location: [place];  Service: Orthopedics;  Laterality: Right;  WITH PATELLA RESURFACING    Current Outpatient Prescriptions  Medication Sig Dispense Refill  . acetaminophen (TYLENOL) 500 MG tablet Take 1,000 mg by mouth every 6 (six) hours as needed for headache.    Marland Kitchen amoxicillin (AMOXIL) 500 MG capsule     . ANDROGEL PUMP 20.25 MG/ACT (1.62%) GEL Apply 1 application topically daily.    Marland Kitchen aspirin 81 MG chewable tablet Chew 1 tablet (81 mg total) by mouth daily.    . celecoxib (CELEBREX) 200 MG capsule Take 1 capsule by mouth. 1-2 times daily with food as needed for pain  12  . clonazePAM (KLONOPIN) 1 MG tablet Take 2 tablets (2 mg total) by mouth at bedtime. 60 tablet 2  . diclofenac sodium (VOLTAREN) 1 % GEL Apply 4 g topically 4 (four) times daily as needed (for leg soreness). 1 Tube 2  . HYDROcodone-acetaminophen (NORCO) 10-325 MG per tablet Take 1 tablet by mouth 4 (four) times daily as needed for moderate pain. For pain  0  . HYDROcodone-acetaminophen (NORCO/VICODIN) 5-325 MG tablet Take 1-2 tablets by mouth every 4 (four) hours as needed (mild pain). 60 tablet 0  . Multiple Vitamin (MULTIVITAMIN WITH MINERALS) TABS tablet Take 2 tablets by mouth daily.    . nitroGLYCERIN (NITROSTAT) 0.4 MG SL tablet Place 0.4 mg under the tongue every 5 (five) minutes as needed for chest pain (Max 3 doses within 15 minutes. Call 911 at 3rd dose).    Marland Kitchen oxyCODONE-acetaminophen (PERCOCET) 10-325 MG tablet     . polyethylene glycol powder (GLYCOLAX/MIRALAX) powder     . primidone (MYSOLINE) 50 MG tablet Take 50 mg by mouth 3 (three) times daily as needed. (shakes)  1  . rOPINIRole (REQUIP) 1 MG tablet TAKE 2 TABLETS BY MOUTH AT BEDTIME 60 tablet 0  . ROZEREM 8 MG tablet Take 8 mg by mouth at bedtime as needed. for sleep  5  . tamsulosin  (FLOMAX) 0.4 MG CAPS capsule Take 1 capsule (0.4 mg total) by mouth daily. 30 capsule 6  . tiZANidine (ZANAFLEX) 4 MG tablet Take 4 mg to 8 mg by mouth twice daily as needed for muscle spasms.  12  . tobramycin-dexamethasone (TOBRADEX) ophthalmic solution Place 2 drops into both eyes 4 (four) times daily as needed. (eye burning)  2   No current facility-administered medications for this visit.     Allergies:   No known allergies  Social History:  The patient  reports that he has been smoking Cigarettes.  He has a 5.50 pack-year smoking history. He has never used smokeless tobacco. He reports that he does not drink alcohol or use drugs.   Family History:  The patient's family history is not on file.    ROS:  Please see the history of present illness. All other systems are reviewed and negative.    PHYSICAL EXAM: VS:  BP 133/78   Pulse 82   Ht 5' 11.2" (1.808 m)   Wt (!) 319 lb (144.7 kg)   BMI 44.24 kg/m  , BMI Body mass index is 44.24 kg/m. GEN: Well nourished, well developed, male in no acute distress  HEENT: normal for age  Neck: no JVD seen but difficult to assess secondary to body habitus, no carotid bruit, no masses Cardiac: Irregular rate and rhythm; soft murmur, no rubs, or gallops Respiratory: Decreased breath sounds bases bilaterally, scattered rales and slight wheeze also noted, mildly increased work of breathing GI: soft, nontender, nondistended, + BS MS: no deformity or atrophy; 2+ left and 3+ right lower extremity edema; distal pulses are 2+ in both upper extremities; unable to palpate in both lower extremities secondary to edema. Capillary refill is delayed at 6 seconds Skin: warm and dry, no rash Neuro:  Strength and sensation are intact Psych: euthymic mood, full affect   EKG:  EKG is ordered today. The ekg ordered today demonstrates atrial fib, HR 82, no acute ischemic changes.   ECHO: 03/30/2017 - Left ventricle: The cavity size was normal. Systolic  function was    mildly to moderately reduced. The estimated ejection fraction was   in the range of 40% to 45%. There is akinesis of the   mid-apicalanteroseptal and anterolateral myocardium. Doppler   parameters are consistent with high ventricular filling pressure. - Aortic valve: Trileaflet; mildly thickened, mildly calcified leaflets. - Aorta: Aortic root dimension: 42 mm (ED). - Ascending aorta: The ascending aorta was mildly dilated. - Mitral valve: There was moderate regurgitation. - Left atrium: The atrium was severely dilated. - Right ventricle: The cavity size was mildly dilated. Wall   thickness was normal. - Right atrium: The atrium was mildly dilated. - Pulmonary arteries: Systolic pressure was moderately increased.   PA peak pressure: 51 mm Hg (S). Impressions: - Compared to the prior study, there has been no significant   interval change.  Recent Labs: 05/13/2017: BUN 14; Creatinine, Ser 1.05; Hemoglobin 14.5; Platelets 164; Potassium 4.4; Sodium 135    Lipid Panel    Component Value Date/Time   CHOL 145 10/23/2014 1105   TRIG 122 10/23/2014 1105   HDL 27 (L) 10/23/2014 1105   CHOLHDL 5.4 10/23/2014 1105   VLDL 24 10/23/2014 1105   LDLCALC 94 10/23/2014 1105     Wt Readings from Last 3 Encounters:  05/16/17 (!) 319 lb (144.7 kg)  05/13/17 (!) 320 lb (145.2 kg)  03/03/17 (!) 310 lb 12.8 oz (141 kg)     Other studies Reviewed: Additional studies/ records that were reviewed today include: Office notes, hospital records and testing.  ASSESSMENT AND PLAN: Dr. Sallyanne Kuster saw the patient and is in agreement with plan  1.  Lower extremity edema: This is acute on chronic. His risk of DVT is high because of the atrial fibrillation. We have lower extremity Dopplers arranged for this afternoon.**The Dopplers were positive for bilateral DVT**  Bilaera LE DVT: He will be started on treatment doses Xarelto at 15 mg twice  a day. This will be followed by pharmacy. His  shortness of breath needs to be followed. He may require home oxygen, if diuresis does not improve his respiratory status, he may need evaluation to see if he has PE.  2. Acute systolic CHF: On his recent echocardiogram, he had a decreased EF with wall motion abnormalities, not significantly changed from 2016.   He has gained 10 pounds in the past couple of months. He does not know if he is on a low-sodium diet. He is waking up with lower extremity edema. He describes orthopnea. His activity level is poor and he has significant dyspnea on exertion. We will diurese him with IV Lasix and follow his respiratory status.  3. Persistent atrial fibrillation: He was in atrial fibrillation on September 14 when he was seen at the urgent care. His rate is controlled. He was on Coreg 3.125 mg twice a day in June 16 2016 when Dr. Debara Pickett saw him. 07/01/2016 progress note, preadmission evaluation for back surgery, carvedilol was no longer on his medication list. The reason for this is unclear.   Follow his heart rate on telemetry and add a beta blocker if his heart rate is not too slow.  4. CAD: He is not having any angina. His ECG does not have any acute ischemic changes. He is on aspirin, and sublingual nitroglycerin. He is not on a beta blocker, ACE/ARB or statin. I one point, he was on high-dose Lipitor at 80 mg daily and low-dose carvedilol 3.125 mm twice a day. When Dr. Debara Pickett saw him in July, he wanted to get lipid profile and complete metabolic profile. We will obtain these in the hospital.  5. Tobacco use: He is still smoking and will be encouraged to quit. He has had very few cigarettes in the last few days. This is likely in response to increased shortness of breath.   Current medicines are reviewed at length with the patient today.  The patient does not have concerns regarding medicines.  The following changes have been made:  Changes will be made in the hospital  Labs/ tests ordered today include:     Orders Placed This Encounter  Procedures  . Basic metabolic panel  . EKG 12-Lead     Disposition:   FU with Dr. Debara Pickett  Signed, [name] Ferries, PA-C  05/16/2017 6:24 PM    [place] Phone: [phone]; Fax: [phone]  This note was written with the assistance of speech recognition software. Please excuse any transcriptional errors.

## 2017-05-16 NOTE — Patient Instructions (Addendum)
Medication Instructions:   [name] [name], [name] has recommended you make the following change in your medication:  -- START lasix 20mg  daily  Labwork:  BMET today  Testing/Procedures:  Your physician has requested that you have a lower extremity venous duplex TODAY. This test is an ultrasound of the veins in the legs. It looks at venous blood flow that carries blood from the heart to the legs. Allow one hour for a Lower Venous exam. There are no restrictions or special instructions.  Follow-Up:  2-3 weeks with [name] Marker, PA  Any Other Special Instructions Will Be Listed Below (If Applicable).  Weigh yourself daily OK to use ACE wrap on right leg every morning Limit sodium to 2000mg  daily  If you don't lose at least 5lbs in 2 weeks - call our office

## 2017-05-16 NOTE — Telephone Encounter (Signed)
This patient "No Showed" his [facility] appointment on 05-12-17.

## 2017-05-16 NOTE — Progress Notes (Signed)
New Admission Note:   Arrival Method: wheelchair with ED tech, Direct admit Mental Orientation: A&O x4 Telemetry: initiated, verified Skin: BLE/BUE edema, right lower leg has a dry patch on it. BLE redness. Pain: denies, but says right lower leg hurts at times  Patient has porta cath in right chest, says it was used for cancer treatment about 4 years ago.  Orders to be reviewed and implemented. Will continue to monitor the patient. Call light has been placed within reach and bed alarm has been activated.   Riki Altes, RN Phone: [phone]

## 2017-05-16 NOTE — Progress Notes (Signed)
Patient to CT angio of chest.

## 2017-05-16 NOTE — Progress Notes (Signed)
ANTICOAGULATION CONSULT NOTE - Initial Consult  Pharmacy Consult for Xarelto Indication: DVT  Allergies  Allergen Reactions  . No Known Allergies     Patient Measurements: Height: 5\' 11"  (180.3 cm) Weight: (!) 316 lb (143.3 kg) IBW/kg (Calculated) : 75.3  Assessment: 76 yo M presents with lower extremity swelling. Dopplers show bilateral DVT. Pharmacy consulted for Xarelto.  Goal of Therapy:  Monitor platelets by anticoagulation protocol: Yes   Plan:  Start Xarelto 15mg  PO BID x  21 days, then start Xarelto 20mg  daily  Monitor CBC, s/s of bleed  Elenor Quinones, PharmD, [facility] Clinical Pharmacist Pager [phone] 05/16/2017 9:14 PM

## 2017-05-16 NOTE — H&P (Signed)
Cardiology History and Physical   Date:  05/16/2017   ID:  JEBADIAH IMPERATO, DOB 09-Mar-1941, MRN [id number]  PCP:  Sinda Du, MD   Cardiologist:  Dr. Debara Pickett, 03/03/2017  Rosaria Ferries, PA-C      Chief Complaint  Patient presents with  . new AF? leg swelling    History of Present Illness: [name] [name] is a 76 y.o. 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   [facility] visit 05/13/2017 for right lower extremity swelling, pain and erythema for about a week and a half, this was associated with shortness of breath. ECG showed atrial fibrillation, heart rate 90. Patient was sent to the ER but left before being seen after waiting 5 hours.  09/17 Phone notes regarding above symptoms, appointment made Pt had appt at [facility] for   [name] presents for cardiology evaluation.  His RLE has always swollen more than the L. He wakes with some edema, it gets worse during the day. When he lays down at night, it hurts more. He has an area of abnormal skin on his shin, but it is not open or draining. He was scheduled to see a dermatologist. He said that when he went into the office he could tell they were not for him and he left without being seen. He is unclear on exactly when this was. He has been I'm able to get on any compression socks or stockings. He has not had any numbness or tingling.  He has no awareness of the atrial fib. He denies presyncope or syncope. Not aware of any rapid or irregular HR.    He has chronic dyspnea on exertion, is not sure that it has changed recently. He has some orthopnea, but denies PND. He feels the orthopnea is related to body habitus. He does not weigh himself daily. His daughter cooks the food,  but he does not know if she pays attention to the amount of sodium in it.   He has not had chest pain with exertion. That said, his exertion level has been poor for a while. He admits that he walks very little. He cannot do steps well.       Past Medical History:  Diagnosis Date  . Anxiety    situational  . Bilateral lower extremity edema   . CAD (coronary artery disease) 10/23/14   s/p LAD DES / STEMI with CGS  . Cancer (HCC)    Mantle cell lymphoma  . Dyspnea    with exertion  . ED (erectile dysfunction)   . Hyperlipidemia   . Hypertension   . Insomnia   . Ischemic cardiomyopathy 10/24/14   EF 40-45% by echo  . Obesity    morbid  . OSA (obstructive sleep apnea)    severe, on CPAP 12  . Osteoarthritis   . Peripheral edema   . Restless leg syndrome   . Rhinitis   . Tobacco abuse          Past Surgical History:  Procedure Laterality Date  . ANTERIOR LAT LUMBAR FUSION N/A 07/02/2016   Procedure: LUMBAR THREE - FOUR ANTERIOR LATERAL LUMBAR FUSION WITH PERCUTANEOUS SCREWS LEFT;  Surgeon: Earnie Larsson, MD;  Location: [facility];  Service: Neurosurgery;  Laterality:  N/A;  . BACK SURGERY  10/2009  . CORONARY ANGIOPLASTY    . EYE SURGERY     lazer bil  . HEMORRHOID SURGERY    . INTRA-AORTIC BALLOON PUMP INSERTION  10/23/2014   Procedure: INTRA-AORTIC BALLOON PUMP INSERTION;  Surgeon: Peter M Martinique, MD;  Location: [facility] CATH LAB;  Service: Cardiovascular;;  . LEFT HEART CATHETERIZATION WITH CORONARY ANGIOGRAM N/A 10/23/2014   Procedure: LEFT HEART CATHETERIZATION WITH CORONARY ANGIOGRAM;  Surgeon: Peter M Martinique, MD;  Location: [facility] CATH LAB;  Service: Cardiovascular;  Laterality: N/A;  . LYMPH NODE BIOPSY Right 01/2014   groin area  . PERCUTANEOUS CORONARY STENT INTERVENTION (PCI-S)  10/23/2014   Procedure: PERCUTANEOUS CORONARY STENT INTERVENTION (PCI-S);  Surgeon: Peter M Martinique, MD;  Location: [facility] CATH LAB;  Service: Cardiovascular;;  . PORTACATH  PLACEMENT Right 2015  . ROTATOR CUFF REPAIR  2011  . TOTAL KNEE ARTHROPLASTY     left  . TOTAL KNEE ARTHROPLASTY  08/04/2012   right knee  . TOTAL KNEE ARTHROPLASTY  08/04/2012   Procedure: TOTAL KNEE ARTHROPLASTY;  Surgeon: Yvette Rack., MD;  Location: [facility];  Service: Orthopedics;  Laterality: Right;  WITH PATELLA RESURFACING          Current Outpatient Prescriptions  Medication Sig Dispense Refill  . acetaminophen (TYLENOL) 500 MG tablet Take 1,000 mg by mouth every 6 (six) hours as needed for headache.    Marland Kitchen amoxicillin (AMOXIL) 500 MG capsule     . ANDROGEL PUMP 20.25 MG/ACT (1.62%) GEL Apply 1 application topically daily.    Marland Kitchen aspirin 81 MG chewable tablet Chew 1 tablet (81 mg total) by mouth daily.    . celecoxib (CELEBREX) 200 MG capsule Take 1 capsule by mouth. 1-2 times daily with food as needed for pain  12  . clonazePAM (KLONOPIN) 1 MG tablet Take 2 tablets (2 mg total) by mouth at bedtime. 60 tablet 2  . diclofenac sodium (VOLTAREN) 1 % GEL Apply 4 g topically 4 (four) times daily as needed (for leg soreness). 1 Tube 2  . HYDROcodone-acetaminophen (NORCO) 10-325 MG per tablet Take 1 tablet by mouth 4 (four) times daily as needed for moderate pain. For pain  0  . HYDROcodone-acetaminophen (NORCO/VICODIN) 5-325 MG tablet Take 1-2 tablets by mouth every 4 (four) hours as needed (mild pain). 60 tablet 0  . Multiple Vitamin (MULTIVITAMIN WITH MINERALS) TABS tablet Take 2 tablets by mouth daily.    . nitroGLYCERIN (NITROSTAT) 0.4 MG SL tablet Place 0.4 mg under the tongue every 5 (five) minutes as needed for chest pain (Max 3 doses within 15 minutes. Call 911 at 3rd dose).    Marland Kitchen oxyCODONE-acetaminophen (PERCOCET) 10-325 MG tablet     . polyethylene glycol powder (GLYCOLAX/MIRALAX) powder     . primidone (MYSOLINE) 50 MG tablet Take 50 mg by mouth 3 (three) times daily as needed. (shakes)  1  . rOPINIRole (REQUIP) 1 MG tablet TAKE 2 TABLETS BY MOUTH AT  BEDTIME 60 tablet 0  . ROZEREM 8 MG tablet Take 8 mg by mouth at bedtime as needed. for sleep  5  . tamsulosin (FLOMAX) 0.4 MG CAPS capsule Take 1 capsule (0.4 mg total) by mouth daily. 30 capsule 6  . tiZANidine (ZANAFLEX) 4 MG tablet Take 4 mg to 8 mg by mouth twice daily as needed for muscle spasms.  12  . tobramycin-dexamethasone (TOBRADEX) ophthalmic solution Place 2 drops into both eyes 4 (four) times daily as needed. (eye burning)  2  No current facility-administered medications for this visit.     Allergies:   No known allergies    Social History:  The patient  reports that he has been smoking Cigarettes.  He has a 5.50 pack-year smoking history. He has never used smokeless tobacco. He reports that he does not drink alcohol or use drugs.   Family History  Problem Relation Age of Onset  . Unexplained death Mother 72       Considered natural causes, no autopsy  . Early death Father        Killed In World War II  . Coronary artery disease Neg Hx     Family Status  Relation Status  . Mother Deceased  . Father Deceased  . Neg Hx (Not Specified)    ROS:  Please see the history of present illness. All other systems are reviewed and negative.    PHYSICAL EXAM: VS:  BP 133/78   Pulse 82   Ht 5' 11.2" (1.808 m)   Wt (!) 319 lb (144.7 kg)   BMI 44.24 kg/m  , BMI Body mass index is 44.24 kg/m. GEN: Well nourished, well developed, male in no acute distress  HEENT: normal for age  Neck: no JVD seen but difficult to assess secondary to body habitus, no carotid bruit, no masses Cardiac: Irregular rate and rhythm; soft murmur, no rubs, or gallops Respiratory: Decreased breath sounds bases bilaterally, scattered rales and slight wheeze also noted, mildly increased work of breathing GI: soft, nontender, nondistended, + BS MS: no deformity or atrophy; 2+ left and 3+ right lower extremity edema; distal pulses are 2+ in both upper extremities; unable to palpate in both  lower extremities secondary to edema. Capillary refill is delayed at 6 seconds Skin: warm and dry, no rash Neuro:  Strength and sensation are intact Psych: euthymic mood, full affect   EKG:  EKG is ordered today. The ekg ordered today demonstrates atrial fib, HR 82, no acute ischemic changes.   ECHO: 03/30/2017 - Left ventricle: The cavity size was normal. Systolic function was  mildly to moderately reduced. The estimated ejection fraction was in the range of 40% to 45%. There is akinesis of the mid-apicalanteroseptal and anterolateral myocardium. Doppler parameters are consistent with high ventricular filling pressure. - Aortic valve: Trileaflet; mildly thickened, mildly calcifiedleaflets. - Aorta: Aortic root dimension: 42 mm (ED). - Ascending aorta: The ascending aorta was mildly dilated. - Mitral valve: There was moderate regurgitation. - Left atrium: The atrium was severely dilated. - Right ventricle: The cavity size was mildly dilated. Wall thickness was normal. - Right atrium: The atrium was mildly dilated. - Pulmonary arteries: Systolic pressure was moderately increased. PA peak pressure: 51 mm Hg (S). Impressions: - Compared to the prior study, there has been no significant interval change.  Recent Labs: 05/13/2017: BUN 14; Creatinine, Ser 1.05; Hemoglobin 14.5; Platelets 164; Potassium 4.4; Sodium 135    Lipid Panel Labs (Brief)     Component Value Date/Time   CHOL 145 10/23/2014 1105   TRIG 122 10/23/2014 1105   HDL 27 (L) 10/23/2014 1105   CHOLHDL 5.4 10/23/2014 1105   VLDL 24 10/23/2014 1105   LDLCALC 94 10/23/2014 1105          Wt Readings from Last 3 Encounters:  05/16/17 (!) 319 lb (144.7 kg)  05/13/17 (!) 320 lb (145.2 kg)  03/03/17 (!) 310 lb 12.8 oz (141 kg)     Other studies Reviewed: Additional studies/ records that were reviewed today include:  Office notes, hospital records and testing.  ASSESSMENT AND PLAN:  Dr. Sallyanne Kuster saw the patient and is in agreement with plan  1.  Lower extremity edema: This is acute on chronic. His risk of DVT is high because of the atrial fibrillation. We have lower extremity Dopplers arranged for this afternoon.**The Dopplers were positive for bilateral DVT**  [place] LE DVT: He will be started on treatment doses Xarelto at 15 mg twice a day. This will be followed by pharmacy. His shortness of breath needs to be followed. He may require home oxygen. If diuresis does not improve his respiratory status, he may need evaluation to see if he has PE.  2. Acute systolic CHF: On his recent echocardiogram, he had a decreased EF with wall motion abnormalities, not significantly changed from 2016.   He has gained 10 pounds in the past couple of months. He does not know if he is on a low-sodium diet. He is waking up with lower extremity edema. He describes orthopnea. His activity level is poor and he has significant dyspnea on exertion. We will diurese him with IV Lasix and follow his respiratory status.  3. Persistent atrial fibrillation: He was in atrial fibrillation on September 14 when he was seen at the urgent care. His rate is controlled. He was on Coreg 3.125 mg twice a day in June 16 2016 when Dr. Debara Pickett saw him. 07/01/2016 progress note, preadmission evaluation for back surgery, carvedilol was no longer on his medication list. The reason for this is unclear.   Follow his heart rate on telemetry and add a beta blocker if his heart rate is not too slow.  4. CAD: He is not having any angina. His ECG does not have any acute ischemic changes. He is on aspirin, and sublingual nitroglycerin. He is not on a beta blocker, ACE/ARB or statin. I one point, he was on high-dose Lipitor at 80 mg daily and low-dose carvedilol 3.125 mm twice a day. When Dr. Debara Pickett saw him in July, he wanted to get lipid profile and complete metabolic profile. We will obtain these in the hospital.  5.  Tobacco use: He is still smoking and will be encouraged to quit. He has had very few cigarettes in the last few days. This is likely in response to increased shortness of breath.   Current medicines are reviewed at length with the patient today.  The patient does not have concerns regarding medicines.  The following changes have been made:  Changes will be made in the hospital  Labs/ tests ordered today include:      Orders Placed This Encounter  Procedures  . Basic metabolic panel  . EKG 12-Lead      Signed, Lenoard Aden  05/16/2017 6:24 PM    [facility] Phone: [phone]; Fax: [phone]  This note was written with the assistance of speech recognition software. Please excuse any transcriptional errors.  I have seen and examined the patient along with Barrett, Suanne Marker, PA-C .  I have reviewed the chart, notes and new data.  I agree with PA's note.  Key new complaints: He has dyspnea and persistent lower extremity edema. He is unaware of the arrhythmia Key examination changes: irregular rhythm, substantial bilateral lower showed edema, a few bilateral wheezes, obesity Key new findings / data: Bilateral lower extremity DVTs with at least partial component of acute thrombus  PLAN:  Recommend hospitalization, workup for pulmonary embolism with repeat limited echocardiogram and CT angiography of  the chest, initiate anticoagulation therapy. Call the hospital to arrange direct admission to a telemetry bed. Discussed with APP on call.  Sanda Klein, MD, [place] [phone] 05/17/2017, 8:31 AM

## 2017-05-17 ENCOUNTER — Observation Stay (HOSPITAL_COMMUNITY): Payer: Medicare Other

## 2017-05-17 DIAGNOSIS — Z79899 Other long term (current) drug therapy: Secondary | ICD-10-CM | POA: Diagnosis not present

## 2017-05-17 DIAGNOSIS — Z955 Presence of coronary angioplasty implant and graft: Secondary | ICD-10-CM | POA: Diagnosis not present

## 2017-05-17 DIAGNOSIS — G4733 Obstructive sleep apnea (adult) (pediatric): Secondary | ICD-10-CM | POA: Diagnosis present

## 2017-05-17 DIAGNOSIS — I481 Persistent atrial fibrillation: Secondary | ICD-10-CM | POA: Diagnosis not present

## 2017-05-17 DIAGNOSIS — E785 Hyperlipidemia, unspecified: Secondary | ICD-10-CM | POA: Diagnosis present

## 2017-05-17 DIAGNOSIS — I255 Ischemic cardiomyopathy: Secondary | ICD-10-CM | POA: Diagnosis present

## 2017-05-17 DIAGNOSIS — I824Z3 Acute embolism and thrombosis of unspecified deep veins of distal lower extremity, bilateral: Secondary | ICD-10-CM | POA: Diagnosis not present

## 2017-05-17 DIAGNOSIS — I5021 Acute systolic (congestive) heart failure: Secondary | ICD-10-CM | POA: Diagnosis not present

## 2017-05-17 DIAGNOSIS — Z96653 Presence of artificial knee joint, bilateral: Secondary | ICD-10-CM | POA: Diagnosis present

## 2017-05-17 DIAGNOSIS — I872 Venous insufficiency (chronic) (peripheral): Secondary | ICD-10-CM | POA: Diagnosis present

## 2017-05-17 DIAGNOSIS — I340 Nonrheumatic mitral (valve) insufficiency: Secondary | ICD-10-CM | POA: Diagnosis not present

## 2017-05-17 DIAGNOSIS — I110 Hypertensive heart disease with heart failure: Secondary | ICD-10-CM | POA: Diagnosis present

## 2017-05-17 DIAGNOSIS — I252 Old myocardial infarction: Secondary | ICD-10-CM | POA: Diagnosis not present

## 2017-05-17 DIAGNOSIS — I82403 Acute embolism and thrombosis of unspecified deep veins of lower extremity, bilateral: Secondary | ICD-10-CM | POA: Diagnosis present

## 2017-05-17 DIAGNOSIS — Z7982 Long term (current) use of aspirin: Secondary | ICD-10-CM | POA: Diagnosis not present

## 2017-05-17 DIAGNOSIS — Z6841 Body Mass Index (BMI) 40.0 and over, adult: Secondary | ICD-10-CM | POA: Diagnosis not present

## 2017-05-17 DIAGNOSIS — I2510 Atherosclerotic heart disease of native coronary artery without angina pectoris: Secondary | ICD-10-CM | POA: Diagnosis not present

## 2017-05-17 DIAGNOSIS — F17210 Nicotine dependence, cigarettes, uncomplicated: Secondary | ICD-10-CM | POA: Diagnosis present

## 2017-05-17 DIAGNOSIS — F419 Anxiety disorder, unspecified: Secondary | ICD-10-CM | POA: Diagnosis present

## 2017-05-17 DIAGNOSIS — Z981 Arthrodesis status: Secondary | ICD-10-CM | POA: Diagnosis not present

## 2017-05-17 LAB — ECHOCARDIOGRAM LIMITED
Height: 71 in
Weight: 5043.2 oz

## 2017-05-17 LAB — BASIC METABOLIC PANEL
BUN/Creatinine Ratio: 17 (ref 10–24)
BUN: 17 mg/dL (ref 8–27)
CO2: 24 mmol/L (ref 20–29)
CREATININE: 1.03 mg/dL (ref 0.76–1.27)
Calcium: 8.7 mg/dL (ref 8.6–10.2)
Chloride: 102 mmol/L (ref 96–106)
GFR calc Af Amer: 82 mL/min/{1.73_m2} (ref >59)
GFR, EST NON AFRICAN AMERICAN: 71 mL/min/{1.73_m2} (ref >59)
Glucose: 92 mg/dL (ref 65–99)
Potassium: 5 mmol/L (ref 3.5–5.2)
SODIUM: 139 mmol/L (ref 134–144)

## 2017-05-17 LAB — LIPID PANEL
CHOL/HDL RATIO: 4.7 ratio
CHOLESTEROL: 127 mg/dL (ref 0–200)
HDL: 27 mg/dL — ABNORMAL LOW (ref >40)
LDL Cholesterol: 92 mg/dL (ref 0–99)
TRIGLYCERIDES: 38 mg/dL (ref <150)
VLDL: 8 mg/dL (ref 0–40)

## 2017-05-17 MED ORDER — TOBRAMYCIN-DEXAMETHASONE 0.3-0.1 % OP SUSP: 2.0000 [drp] | Freq: Four times a day (QID) | OPHTHALMIC | Status: DC | PRN

## 2017-05-17 MED ORDER — CELECOXIB 200 MG PO CAPS: 200.0000 mg | ORAL_CAPSULE | Freq: Two times a day (BID) | ORAL | Status: DC | PRN

## 2017-05-17 MED ORDER — HYDROCODONE-ACETAMINOPHEN 10-325 MG PO TABS
1.0000 | ORAL_TABLET | Freq: Four times a day (QID) | ORAL | Status: DC | PRN
  Administered 2017-05-17 – 2017-05-18 (×2): 1 via ORAL
  Filled 2017-05-17 (×2): qty 1

## 2017-05-17 MED ORDER — PRIMIDONE 50 MG PO TABS: 50.0000 mg | ORAL_TABLET | Freq: Three times a day (TID) | ORAL | Status: DC | PRN

## 2017-05-17 MED ORDER — TIZANIDINE HCL 4 MG PO TABS: 4.0000 mg | ORAL_TABLET | Freq: Every day | ORAL | Status: DC | PRN

## 2017-05-17 MED ORDER — ADULT MULTIVITAMIN W/MINERALS CH
1.0000 | ORAL_TABLET | Freq: Every day | ORAL | Status: DC
  Administered 2017-05-17 – 2017-05-18 (×2): 1 via ORAL
  Filled 2017-05-17 (×2): qty 1

## 2017-05-17 MED ORDER — ASPIRIN 81 MG PO CHEW
81.0000 mg | CHEWABLE_TABLET | Freq: Every day | ORAL | Status: DC
  Administered 2017-05-17 – 2017-05-18 (×2): 81 mg via ORAL
  Filled 2017-05-17 (×2): qty 1

## 2017-05-17 MED ORDER — ATORVASTATIN CALCIUM 40 MG PO TABS
40.0000 mg | ORAL_TABLET | Freq: Every day | ORAL | Status: DC
  Administered 2017-05-17: 40 mg via ORAL
  Filled 2017-05-17: qty 1

## 2017-05-17 MED ORDER — HYDROCODONE-ACETAMINOPHEN 5-325 MG PO TABS
1.0000 | ORAL_TABLET | ORAL | Status: DC | PRN
  Administered 2017-05-17: 2 via ORAL
  Filled 2017-05-17: qty 2

## 2017-05-17 MED ORDER — DICLOFENAC SODIUM 1 % TD GEL
4.0000 g | Freq: Three times a day (TID) | TRANSDERMAL | Status: DC | PRN
  Administered 2017-05-17 (×2): 4 g via TOPICAL
  Filled 2017-05-17: qty 100

## 2017-05-17 MED ORDER — TESTOSTERONE 50 MG/5GM (1%) TD GEL
5.0000 g | Freq: Every day | TRANSDERMAL | Status: DC
  Filled 2017-05-17: qty 5

## 2017-05-17 MED ORDER — FUROSEMIDE 10 MG/ML IJ SOLN
40.0000 mg | Freq: Three times a day (TID) | INTRAMUSCULAR | Status: DC
  Administered 2017-05-17 – 2017-05-18 (×2): 40 mg via INTRAVENOUS
  Filled 2017-05-17 (×2): qty 4

## 2017-05-17 MED ORDER — HYDROCODONE-ACETAMINOPHEN 10-325 MG PO TABS
1.0000 | ORAL_TABLET | Freq: Four times a day (QID) | ORAL | Status: DC | PRN
  Administered 2017-05-17: 1 via ORAL
  Filled 2017-05-17: qty 1

## 2017-05-17 NOTE — Progress Notes (Signed)
Progress Note  Patient Name: [name] [name] Date of Encounter: 05/17/2017  Primary Cardiologist: Dr. Debara Pickett  Subjective   Up in chair complaining of leg pain would like pain meds.  Also jittery no chest pain and no SOB  Inpatient Medications    Scheduled Meds: . aspirin  81 mg Oral Daily  . clonazePAM  2 mg Oral QHS  . furosemide  40 mg Intravenous BID  . multivitamin with minerals  1 tablet Oral Daily  . rivaroxaban  15 mg Oral BID WC   Followed by  . [START ON 06/06/2017] rivaroxaban  20 mg Oral Q supper  . rOPINIRole  2 mg Oral QHS  . tamsulosin  0.4 mg Oral Daily  . testosterone  5 g Transdermal Daily   Continuous Infusions:  PRN Meds: acetaminophen, ALPRAZolam, celecoxib, diclofenac sodium, HYDROcodone-acetaminophen, HYDROcodone-acetaminophen, nitroGLYCERIN, ondansetron (ZOFRAN) IV, primidone, ramelteon, tiZANidine, tobramycin-dexamethasone, zolpidem   Vital Signs    Vitals:   05/16/17 2027 05/17/17 0011 05/17/17 0527 05/17/17 0735  BP: 119/80 93/66 128/90 136/72  Pulse: 87 86 83 82  Resp: 18 20 18    Temp: 98.3 F (36.8 C) 97.7 F (36.5 C) 97.8 F (36.6 C)   TempSrc: Oral Oral Oral   SpO2: 97% 95% 96% 100%  Weight: (!) 316 lb (143.3 kg)  (!) 315 lb 3.2 oz (143 kg)   Height: 5\' 11"  (1.803 m)       Intake/Output Summary (Last 24 hours) at 05/17/17 1057 Last data filed at 05/17/17 0929  Gross per 24 hour  Intake              800 ml  Output              500 ml  Net              300 ml   Filed Weights   05/16/17 2027 05/17/17 0527  Weight: (!) 316 lb (143.3 kg) (!) 315 lb 3.2 oz (143 kg)    Telemetry    A fib rate controlled  - Personally Reviewed  ECG    No new - Personally Reviewed  Physical Exam   GEN: No acute distress.   Neck: No JVD Cardiac: Irreg irreg, no murmurs, rubs, or gallops.  Respiratory: diminshed breath sounds and wheezes to auscultation bilaterally. GI: Soft, nontender, non-distended  MS: 2-3+ edema; No deformity. Neuro:   Nonfocal  Psych: Normal affect   Labs    Chemistry Recent Labs Lab 05/13/17 1505 05/16/17 1459 05/16/17 2214  NA 135 139 135  K 4.4 5.0 3.9  CL 102 102 103  CO2 24 24 23   GLUCOSE 104* 92 146*  BUN 14 17 14   CREATININE 1.05 1.03 1.02  CALCIUM 9.2 8.7 8.7*  PROT  --   --  6.3*  ALBUMIN  --   --  3.8  AST  --   --  20  ALT  --   --  20  ALKPHOS  --   --  86  BILITOT  --   --  1.5*  GFRNONAA >60 71 >60  GFRAA >60 82 >60  ANIONGAP 9  --  9     Hematology Recent Labs Lab 05/13/17 1505 05/16/17 2120  WBC 4.6 4.9  RBC 4.58 4.27  HGB 14.5 13.5  HCT 43.9 40.6  MCV 95.9 95.1  MCH 31.7 31.6  MCHC 33.0 33.3  RDW 15.4 15.6*  PLT 164 159    Cardiac EnzymesNo results for input(s): TROPONINI in  the last 168 hours.  Recent Labs Lab 05/13/17 1517  TROPIPOC 0.01     BNP Recent Labs Lab 05/16/17 2120  BNP 652.0*     DDimer No results for input(s): DDIMER in the last 168 hours.   Radiology    Ct Angio Chest Pe W Or Wo Contrast  Result Date: 05/17/2017 CLINICAL DATA:  Shortness of breath EXAM: CT ANGIOGRAPHY CHEST WITH CONTRAST TECHNIQUE: Multidetector CT imaging of the chest was performed using the standard protocol during bolus administration of intravenous contrast. Multiplanar CT image reconstructions and MIPs were obtained to evaluate the vascular anatomy. CONTRAST:  80 mL Isovue 370 COMPARISON:  Chest radiograph 05/13/2017 FINDINGS: Cardiovascular: Contrast injection is sufficient to demonstrate satisfactory opacification of the pulmonary arteries to the segmental level. There is no pulmonary embolus. The main pulmonary artery is enlarged, measuring 3.3 cm at the bifurcation. There is no CT evidence of acute right heart strain. Mild calcific atherosclerosis of the aorta. There is a normal 3-vessel arch branching pattern. Heart size is enlarged. Status post coronary artery stenting. No pericardial effusion. Mediastinum/Nodes: No mediastinal, hilar or axillary  lymphadenopathy. The visualized thyroid and thoracic esophageal course are unremarkable. Lungs/Pleura: Small bilateral pleural effusions. Mild interstitial edema. No focal consolidation. There is bibasilar atelectasis. No pulmonary nodules or masses. Upper Abdomen: Contrast bolus timing is not optimized for evaluation of the abdominal organs. Within this limitation, the visualized organs of the upper abdomen are normal. Musculoskeletal: No chest wall abnormality. No acute or significant osseous findings. Review of the MIP images confirms the above findings. IMPRESSION: 1. No pulmonary embolus. 2. Cardiomegaly, small bilateral pleural effusions and interstitial edema, suggesting congestive heart failure. 3. Mildly enlarged main pulmonary artery, which may be seen in pulmonary hypertension. 4.  Aortic Atherosclerosis (ICD10-I70.0). Electronically Signed   By: Ulyses Jarred M.D.   On: 05/17/2017 00:01    Cardiac Studies   Echo 05/17/17  Study Conclusions  - Left ventricle: The cavity size was normal. Wall thickness was   normal. Systolic function was mildly to moderately reduced. The   estimated ejection fraction was in the range of 40% to 45%.   Hypokinesis of the mid-apicalanteroseptal, anterior, and   anterolateral myocardium; consistent with ischemia in the   distribution of the left anterior descending coronary artery. No   evidence of thrombus. - Mitral valve: There was at least moderate to severe   regurgitation. Valve area by continuity equation (using LVOT   flow): 1.64 cm^2. - Left atrium: The atrium was moderately to severely dilated. - Right ventricle: The cavity size was moderately dilated. Wall   thickness was normal. Systolic function was mildly reduced. - Right atrium: The atrium was moderately to severely dilated.  Impressions:  - Images from March 30, 2017 study were also reviewed. There is no   change in LV function or wall motion. Pulmonary vein flow was not   recorded  on the current study, but the previous study is   suspicious from systolic flow reversal in the pulmonary veins,   Suspect there is severe mitral insufficiency. TEE may be   indicated, especially if the patient is a candidate for mitral   valve repair. Compared to March 30, 2017, RV function is slightly   worse, both by visual estimation and by objective measurements   (TAPSE and RV annular DTI systolic velocity).  ECHO: 03/30/2017 - Left ventricle: The cavity size was normal. Systolic function was  mildly to moderately reduced. The estimated ejection fraction was in the  range of 40% to 45%. There is akinesis of the mid-apicalanteroseptal and anterolateral myocardium. Doppler parameters are consistent with high ventricular filling pressure. - Aortic valve: Trileaflet; mildly thickened, mildly calcifiedleaflets. - Aorta: Aortic root dimension: 42 mm (ED). - Ascending aorta: The ascending aorta was mildly dilated. - Mitral valve: There was moderate regurgitation. - Left atrium: The atrium was severely dilated. - Right ventricle: The cavity size was mildly dilated. Wall thickness was normal. - Right atrium: The atrium was mildly dilated. - Pulmonary arteries: Systolic pressure was moderately increased. PA peak pressure: 51 mm Hg (S). Impressions: - Compared to the prior study, there has been no significant interval change.     Patient Profile     76 y.o. 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    Assessment & Plan    1. Lower extremity edema: This is acute on chronic.  Continues, though he notes he is voiding more today.     2. [place] LE DVT: Started on treatment doses Xarelto at 15 mg twice a day. This will be followed by  pharmacy. His shortness of breath needs to be followed. He may require home oxygen.CTA neg for PE  3. Acute systolic CHF: On his recent echocardiogram, he had a decreased EF with wall motion abnormalities,not significantly changed from 2016. BNP 652  He has gained 10 pounds in the past couple of months. Needs low Na diet  --I&O today + 300 since admit,  Wt down 1 lb.  --add fluid restriction,  Lasix at 40 mg IV BID, cr is 1.02 will increase lasix to 40 mg TID -- K+ 3.9    4. Persistent atrial fibrillation: He was in atrial fibrillation on September 14 when he was seen at the urgent care. His rate is controlled. He was on Coreg 3.125 mg twice a day in October 182017 when Dr. Debara Pickett saw him. 07/01/2016 progress note, preadmission evaluation for back surgery,carvedilol was no longer on his medication list. The reason for this is unclear.   Follow his heart rate on telemetry and add a beta blocker if his heart rate is not too slow. Hold BB until diuresed more HR controlled  5. CAD: He is not having any angina. His ECG does not have any acute ischemic changes. He is on aspirin, and sublingual nitroglycerin. He is not on a beta blocker, ACE/ARB or statin.  Once HF improved will add low-dose carvedilol 3.125 mm twice a day.   6. HLD LDL 92 Tchol 127  at one point, he was on high-dose Lipitor at 80 mg daily but none recently will add lipitor 40 mg  7. Tobacco use: He is still smoking and will be encouraged to quit. He has had very few cigarettes in the last few days. This is likely in response to increasedshortness of breath.  8.  OSA has cpap in room   9. Possible severe Mitral insuff, may need TEE when Respirations improved.   9.  Stopped androgel - increases risk of DVT- appreciate pharmacy assistance    For questions or updates, please contact [place] Please consult [email or web address] for contact info under Cardiology/STEMI.      Signed, Cecilie Kicks, NP  05/17/2017, 10:57 AM

## 2017-05-17 NOTE — Progress Notes (Signed)
  Echocardiogram 2D Echocardiogram has been performed.  Jennette Dubin 05/17/2017, 9:26 AM

## 2017-05-17 NOTE — Progress Notes (Signed)
Patient complaining of pain and missing night time medications. Paged MD, received verbal orders.

## 2017-05-17 NOTE — Progress Notes (Signed)
Pt is on CPAP for the night tolerating it well. Pt placed on his home settings

## 2017-05-17 NOTE — Progress Notes (Signed)
Patient has nicotine patches from home, has one on now. Says he currently smokes.

## 2017-05-18 DIAGNOSIS — I340 Nonrheumatic mitral (valve) insufficiency: Secondary | ICD-10-CM

## 2017-05-18 MED ORDER — CELECOXIB 200 MG PO CAPS: 200.0000 mg | ORAL_CAPSULE | Freq: Two times a day (BID) | ORAL | Status: DC | PRN

## 2017-05-18 MED ORDER — NITROGLYCERIN 0.4 MG SL SUBL: 0.4000 mg | SUBLINGUAL_TABLET | SUBLINGUAL | 12 refills | Status: AC | PRN

## 2017-05-18 MED ORDER — RIVAROXABAN 15 MG PO TABS: 15.0000 mg | ORAL_TABLET | Freq: Two times a day (BID) | ORAL | 0 refills | Status: DC

## 2017-05-18 MED ORDER — ATORVASTATIN CALCIUM 40 MG PO TABS
40.0000 mg | ORAL_TABLET | Freq: Every day | ORAL | 3 refills | Status: AC
Start: 2017-05-18 — End: ?

## 2017-05-18 MED ORDER — ADULT MULTIVITAMIN W/MINERALS CH: 1.0000 | ORAL_TABLET | Freq: Every day | ORAL | Status: AC

## 2017-05-18 MED ORDER — RIVAROXABAN 20 MG PO TABS: 20.0000 mg | ORAL_TABLET | Freq: Every day | ORAL | 11 refills | Status: DC

## 2017-05-18 NOTE — Progress Notes (Signed)
Paged Kerin Ransom, [name] regarding pt stating he was going home whether he was d/c'd or not.

## 2017-05-18 NOTE — Progress Notes (Signed)
Progress Note  Patient Name: [name] [name] Date of Encounter: 05/18/2017  Primary Cardiologist: Dr. Debara Pickett  Subjective   When I went in the room the pt was dressed and packing his clothes-"Im going home today"  Inpatient Medications    Scheduled Meds: . aspirin  81 mg Oral Daily  . atorvastatin  40 mg Oral q1800  . clonazePAM  2 mg Oral QHS  . furosemide  40 mg Intravenous TID  . multivitamin with minerals  1 tablet Oral Daily  . rivaroxaban  15 mg Oral BID WC   Followed by  . [START ON 06/06/2017] rivaroxaban  20 mg Oral Q supper  . rOPINIRole  2 mg Oral QHS  . tamsulosin  0.4 mg Oral Daily   Continuous Infusions:  PRN Meds: acetaminophen, ALPRAZolam, celecoxib, diclofenac sodium, HYDROcodone-acetaminophen, nitroGLYCERIN, ondansetron (ZOFRAN) IV, primidone, ramelteon, tiZANidine, tobramycin-dexamethasone   Vital Signs    Vitals:   05/17/17 2004 05/18/17 0048 05/18/17 0520 05/18/17 1126  BP: 114/71 131/60 (!) 99/53 118/78  Pulse: 84 71 67 67  Resp: 20 20 20 18   Temp: 98.3 F (36.8 C) 98.6 F (37 C) 98.6 F (37 C) 97.7 F (36.5 C)  TempSrc: Oral Oral Oral Oral  SpO2: 97% 93% 94% 93%  Weight:   (!) 301 lb (136.5 kg)   Height:        Intake/Output Summary (Last 24 hours) at 05/18/17 1517 Last data filed at 05/18/17 1339  Gross per 24 hour  Intake              600 ml  Output             3225 ml  Net            -2625 ml   Filed Weights   05/16/17 2027 05/17/17 0527 05/18/17 0520  Weight: (!) 316 lb (143.3 kg) (!) 315 lb 3.2 oz (143 kg) (!) 301 lb (136.5 kg)    Telemetry    A fib rate controlled, some bradycardia into high 40's overnight - Personally Reviewed  ECG    No new - Personally Reviewed  Physical Exam   GEN: Obese, No acute distress.   Neck: No JVD Cardiac: Irreg irreg, no murmurs, rubs, or gallops.  Respiratory: diminshed breath sounds and wheezes to auscultation bilaterally. GI: Soft, nontender, non-distended  MS: 2-3+ edema; No  deformity. Neuro:  Nonfocal  Psych: Normal affect   Labs    Chemistry  Recent Labs Lab 05/13/17 1505 05/16/17 1459 05/16/17 2214  NA 135 139 135  K 4.4 5.0 3.9  CL 102 102 103  CO2 24 24 23   GLUCOSE 104* 92 146*  BUN 14 17 14   CREATININE 1.05 1.03 1.02  CALCIUM 9.2 8.7 8.7*  PROT  --   --  6.3*  ALBUMIN  --   --  3.8  AST  --   --  20  ALT  --   --  20  ALKPHOS  --   --  86  BILITOT  --   --  1.5*  GFRNONAA >60 71 >60  GFRAA >60 82 >60  ANIONGAP 9  --  9     Hematology  Recent Labs Lab 05/13/17 1505 05/16/17 2120  WBC 4.6 4.9  RBC 4.58 4.27  HGB 14.5 13.5  HCT 43.9 40.6  MCV 95.9 95.1  MCH 31.7 31.6  MCHC 33.0 33.3  RDW 15.4 15.6*  PLT 164 159    Cardiac EnzymesNo results for input(s):  TROPONINI in the last 168 hours.   Recent Labs Lab 05/13/17 1517  TROPIPOC 0.01     BNP  Recent Labs Lab 05/16/17 2120  BNP 652.0*     DDimer No results for input(s): DDIMER in the last 168 hours.   Radiology    Ct Angio Chest Pe W Or Wo Contrast  Result Date: 05/17/2017 CLINICAL DATA:  Shortness of breath EXAM: CT ANGIOGRAPHY CHEST WITH CONTRAST TECHNIQUE: Multidetector CT imaging of the chest was performed using the standard protocol during bolus administration of intravenous contrast. Multiplanar CT image reconstructions and MIPs were obtained to evaluate the vascular anatomy. CONTRAST:  80 mL Isovue 370 COMPARISON:  Chest radiograph 05/13/2017 FINDINGS: Cardiovascular: Contrast injection is sufficient to demonstrate satisfactory opacification of the pulmonary arteries to the segmental level. There is no pulmonary embolus. The main pulmonary artery is enlarged, measuring 3.3 cm at the bifurcation. There is no CT evidence of acute right heart strain. Mild calcific atherosclerosis of the aorta. There is a normal 3-vessel arch branching pattern. Heart size is enlarged. Status post coronary artery stenting. No pericardial effusion. Mediastinum/Nodes: No mediastinal,  hilar or axillary lymphadenopathy. The visualized thyroid and thoracic esophageal course are unremarkable. Lungs/Pleura: Small bilateral pleural effusions. Mild interstitial edema. No focal consolidation. There is bibasilar atelectasis. No pulmonary nodules or masses. Upper Abdomen: Contrast bolus timing is not optimized for evaluation of the abdominal organs. Within this limitation, the visualized organs of the upper abdomen are normal. Musculoskeletal: No chest wall abnormality. No acute or significant osseous findings. Review of the MIP images confirms the above findings. IMPRESSION: 1. No pulmonary embolus. 2. Cardiomegaly, small bilateral pleural effusions and interstitial edema, suggesting congestive heart failure. 3. Mildly enlarged main pulmonary artery, which may be seen in pulmonary hypertension. 4.  Aortic Atherosclerosis (ICD10-I70.0). Electronically Signed   By: Ulyses Jarred M.D.   On: 05/17/2017 00:01    Cardiac Studies   Echo 05/17/17  Study Conclusions  - Left ventricle: The cavity size was normal. Wall thickness was   normal. Systolic function was mildly to moderately reduced. The   estimated ejection fraction was in the range of 40% to 45%.   Hypokinesis of the mid-apicalanteroseptal, anterior, and   anterolateral myocardium; consistent with ischemia in the   distribution of the left anterior descending coronary artery. No   evidence of thrombus. - Mitral valve: There was at least moderate to severe   regurgitation. Valve area by continuity equation (using LVOT   flow): 1.64 cm^2. - Left atrium: The atrium was moderately to severely dilated. - Right ventricle: The cavity size was moderately dilated. Wall   thickness was normal. Systolic function was mildly reduced. - Right atrium: The atrium was moderately to severely dilated.  Impressions:  - Images from March 30, 2017 study were also reviewed. There is no   change in LV function or wall motion. Pulmonary vein flow was  not   recorded on the current study, but the previous study is   suspicious from systolic flow reversal in the pulmonary veins,   Suspect there is severe mitral insufficiency. TEE may be   indicated, especially if the patient is a candidate for mitral   valve repair. Compared to March 30, 2017, RV function is slightly   worse, both by visual estimation and by objective measurements   (TAPSE and RV annular DTI systolic velocity).  ECHO: 03/30/2017 - Left ventricle: The cavity size was normal. Systolic function was  mildly to moderately reduced. The estimated ejection  fraction was in the range of 40% to 45%. There is akinesis of the mid-apicalanteroseptal and anterolateral myocardium. Doppler parameters are consistent with high ventricular filling pressure. - Aortic valve: Trileaflet; mildly thickened, mildly calcifiedleaflets. - Aorta: Aortic root dimension: 42 mm (ED). - Ascending aorta: The ascending aorta was mildly dilated. - Mitral valve: There was moderate regurgitation. - Left atrium: The atrium was severely dilated. - Right ventricle: The cavity size was mildly dilated. Wall thickness was normal. - Right atrium: The atrium was mildly dilated. - Pulmonary arteries: Systolic pressure was moderately increased. PA peak pressure: 51 mm Hg (S). Impressions: - Compared to the prior study, there has been no significant interval change.     Patient Profile     76 y.o. 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    Assessment & Plan    1. Lower extremity edema: This is acute on chronic.  Continues, though he notes he is voiding more today.     2. [place] LE DVT: Started on treatment doses Xarelto at 15 mg twice a day. This  will be followed by pharmacy. CTA neg for PE.  3. Acute systolic CHF: On his recent echocardiogram, he had a decreased EF with wall motion abnormalities,not significantly changed from 2016. BNP 652  He has gained 10 pounds in the past couple of months. Needs low Na diet. He has diuresed 3.7L, 15 lbs    4. Persistent atrial fibrillation: He was in atrial fibrillation on September 14 when he was seen at the urgent care. His rate is controlled. He was on Coreg 3.125 mg twice a day in October 182017 when Dr. Debara Pickett saw him. 07/01/2016 progress note, preadmission evaluation for back surgery,carvedilol was no longer on his medication list. Will not resume beta blocker secondary to bradycardia  5. CAD: He is not having any angina. His ECG does not have any acute ischemic changes. He is on aspirin, and sublingual nitroglycerin. He is not on a beta blocker, ACE/ARB.    6. HLD LDL 92 Tchol 127  at one point, he was on high-dose Lipitor at 80 mg daily but none recently will add lipitor 40 mg  7. Tobacco use: He is still smoking and will be encouraged to quit. He has had very few cigarettes in the last few days. This is likely in response to increasedshortness of breath.  8.  OSA has cpap in room   9. Possible severe Mitral insuff, worsening MR most likely secondary to CHF, would not pursue TEE now.    9.  Stopped androgel - increases risk of DVT- appreciate pharmacy assistance   Plan- Add low dose ARB Q HS. F/U in 7-14 days in office  For questions or updates, please contact [facility] Please consult [email or web address] for contact info under Cardiology/STEMI.      Signed, Kerin Ransom, PA-C  05/18/2017, 3:17 PM

## 2017-05-18 NOTE — Progress Notes (Signed)
Patient states he was told by MD today that he was going home earlier today. As of now patient does not have an order. Patient has removed his IV and is leaving against medical advice. MD has been notified.

## 2017-05-18 NOTE — Plan of Care (Signed)
Problem: Education: Goal: Knowledge of [facility] Education information/materials will improve Outcome: Progressing POC reviewed with pt.   

## 2017-05-18 NOTE — Discharge Instructions (Addendum)
Deep Vein Thrombosis A deep vein thrombosis (DVT) is a blood clot (thrombus) that usually occurs in a deep, larger vein of the lower leg or the pelvis, or in an upper extremity such as the arm. These are dangerous and can lead to serious and even life-threatening complications if the clot travels to the lungs. A DVT can damage the valves in your leg veins so that instead of flowing upward, the blood pools in the lower leg. This is called post-thrombotic syndrome, and it can result in pain, swelling, discoloration, and sores on the leg. What are the causes? A DVT is caused by the formation of a blood clot in your leg, pelvis, or arm. Usually, several things contribute to the formation of blood clots. A clot may develop when:  Your blood flow slows down.  Your vein becomes damaged in some way.  You have a condition that makes your blood clot more easily.  What increases the risk? A DVT is more likely to develop in:  People who are older, especially over 75 years of age.  People who are overweight (obese).  People who sit or lie still for a long time, such as during long-distance travel (over 4 hours), bed rest, hospitalization, or during recovery from certain medical conditions like a stroke.  People who do not engage in much physical activity (sedentary lifestyle).  People who have chronic breathing disorders.  People who have a personal or family history of blood clots or blood clotting disease.  People who have peripheral vascular disease (PVD), diabetes, or some types of cancer.  People who have heart disease, especially if the person had a recent heart attack or has congestive heart failure.  People who have neurological diseases that affect the legs (leg paresis).  People who have had a traumatic injury, such as breaking a hip or leg.  People who have recently had major or lengthy surgery, especially on the hip, knee, or abdomen.  People who have had a central line placed  inside a large vein.  People who take medicines that contain the hormone estrogen. These include birth control pills and hormone replacement therapy.  Pregnancy or during childbirth or the postpartum period.  Long plane flights (over 8 hours).  What are the signs or symptoms?  Symptoms of a DVT can include:  Swelling of your leg or arm, especially if one side is much worse.  Warmth and redness of your leg or arm, especially if one side is much worse.  Pain in your arm or leg. If the clot is in your leg, symptoms may be more noticeable or worse when you stand or walk.  A feeling of pins and needles, if the clot is in the arm.  The symptoms of a DVT that has traveled to the lungs (pulmonary embolism, PE) usually start suddenly and include:  Shortness of breath while active or at rest.  Coughing or coughing up blood or blood-tinged mucus.  Chest pain that is often worse with deep breaths.  Rapid or irregular heartbeat.  Feeling light-headed or dizzy.  Fainting.  Feeling anxious.  Sweating.  There may also be pain and swelling in a leg if that is where the blood clot started. These symptoms may represent a serious problem that is an emergency. Do not wait to see if the symptoms will go away. Get medical help right away. Call your local emergency services (911 in the [place] Do not drive yourself to the hospital. How is this diagnosed? Your health  care provider will take a medical history and perform a physical exam. You may also have other tests, including:  Blood tests to assess the clotting properties of your blood.  Imaging tests, such as CT, ultrasound, MRI, X-ray, and other tests to see if you have clots anywhere in your body.  How is this treated? After a DVT is identified, it can be treated. The type of treatment that you receive depends on many factors, such as the cause of your DVT, your risk for bleeding or developing more clots, and other medical conditions that  you have. Sometimes, a combination of treatments is necessary. Treatment options may be combined and include:  Monitoring the blood clot with ultrasound.  Taking medicines by mouth, such as newer blood thinners (anticoagulants), thrombolytics, or warfarin.  Taking anticoagulant medicine by injection or through an IV tube.  Wearing compression stockings or using different types ofdevices.  Surgery (rare) to remove the blood clot or to place a filter in your abdomen to stop the blood clot from traveling to your lungs.  Treatments for a DVT are often divided into immediate treatment and long-term treatment (up to 3 months after DVT). You can work with your health care provider to choose the treatment program that is best for you. Follow these instructions at home: If you are taking a newer oral anticoagulant:  Take the medicine every single day at the same time each day.  Understand what foods and drugs interact with this medicine.  Understand that there are no regular blood tests required when using this medicine.  Understand the side effects of this medicine, including excessive bruising or bleeding. Ask your health care provider or pharmacist about other possible side effects. If you are taking warfarin:  Understand how to take warfarin and know which foods can affect how warfarin works in Veterinary surgeon.  Understand that it is dangerous to take too much or too little warfarin. Too much warfarin increases the risk of bleeding. Too little warfarin continues to allow the risk for blood clots.  Follow your PT and INR blood testing schedule. The PT and INR results allow your health care provider to adjust your dose of warfarin. It is very important that you have your PT and INR tested as often as told by your health care provider.  Avoid major changes in your diet, or tell your health care provider before you change your diet. Arrange a visit with a registered dietitian to answer your  questions. Many foods, especially foods that are high in vitamin K, can interfere with warfarin and affect the PT and INR results. Eat a consistent amount of foods that are high in vitamin K, such as: ? Spinach, kale, broccoli, cabbage, collard greens, turnip greens, Brussels sprouts, peas, cauliflower, seaweed, and parsley. ? Beef liver and pork liver. ? Green tea. ? Soybean oil.  Tell your health care provider about any and all medicines, vitamins, and supplements that you take, including aspirin and other over-the-counter anti-inflammatory medicines. Be especially cautious with aspirin and anti-inflammatory medicines. Do not take those before you ask your health care provider if it is safe to do so. This is important because many medicines can interfere with warfarin and affect the PT and INR results.  Do not start or stop taking any over-the-counter or prescription medicine unless your health care provider or pharmacist tells you to do so. If you take warfarin, you will also need to do these things:  Hold pressure over cuts for longer than  usual.  Tell your dentist and other health care providers that you are taking warfarin before you have any procedures in which bleeding may occur.  Avoid alcohol or drink very small amounts. Tell your health care provider if you change your alcohol intake.  Do not use tobacco products, including cigarettes, chewing tobacco, and e-cigarettes. If you need help quitting, ask your health care provider.  Avoid contact sports.  General instructions  Take over-the-counter and prescription medicines only as told by your health care provider. Anticoagulant medicines can have side effects, including easy bruising and difficulty stopping bleeding. If you are prescribed an anticoagulant, you will also need to do these things: ? Hold pressure over cuts for longer than usual. ? Tell your dentist and other health care providers that you are taking anticoagulants  before you have any procedures in which bleeding may occur. ? Avoid contact sports.  Wear a medical alert bracelet or carry a medical alert card that says you have had a PE.  Ask your health care provider how soon you can go back to your normal activities. Stay active to prevent new blood clots from forming.  Make sure to exercise while traveling or when you have been sitting or standing for a long period of time. It is very important to exercise. Exercise your legs by walking or by tightening and relaxing your leg muscles often. Take frequent walks.  Wear compression stockings as told by your health care provider to help prevent more blood clots from forming.  Do not use tobacco products, including cigarettes, chewing tobacco, and e-cigarettes. If you need help quitting, ask your health care provider.  Keep all follow-up appointments with your health care provider. This is important. How is this prevented? Take these actions to decrease your risk of developing another DVT:  Exercise regularly. For at least 30 minutes every day, engage in: ? Activity that involves moving your arms and legs. ? Activity that encourages good blood flow through your body by increasing your heart rate.  Exercise your arms and legs every hour during long-distance travel (over 4 hours). Drink plenty of water and avoid drinking alcohol while traveling.  Avoid sitting or lying in bed for long periods of time without moving your legs.  Maintain a weight that is appropriate for your height. Ask your health care provider what weight is healthy for you.  If you are a woman who is over 26 years of age, avoid unnecessary use of medicines that contain estrogen. These include birth control pills.  Do not smoke, especially if you take estrogen medicines. If you need help quitting, ask your health care provider.  If you are hospitalized, prevention measures may include:  Early walking after surgery, as soon as your  health care provider says that it is safe.  Receiving anticoagulants to prevent blood clots.If you cannot take anticoagulants, other options may be available, such as wearing compression stockings or using different types of devices.  Get help right away if:  You have new or increased pain, swelling, or redness in an arm or leg.  You have numbness or tingling in an arm or leg.  You have shortness of breath while active or at rest.  You have chest pain.  You have a rapid or irregular heartbeat.  You feel light-headed or dizzy.  You cough up blood.  You notice blood in your vomit, bowel movement, or urine. These symptoms may represent a serious problem that is an emergency. Do not wait to see  if the symptoms will go away. Get medical help right away. Call your local emergency services (911 in the [place] Do not drive yourself to the hospital. This information is not intended to replace advice given to you by your health care provider. Make sure you discuss any questions you have with your health care provider. Document Released: 08/16/2005 Document Revised: 01/22/2016 Document Reviewed: 12/11/2014 Elsevier Interactive Patient Education  2017 [place] on my medicine - XARELTO (rivaroxaban)  This medication education was reviewed with me or my healthcare representative as part of my discharge preparation.    WHY WAS XARELTO PRESCRIBED FOR YOU? Xarelto was prescribed to treat blood clots that may have been found in the veins of your legs (deep vein thrombosis) or in your lungs (pulmonary embolism) and to reduce the risk of them occurring again.  What do you need to know about Xarelto? The starting dose is one 15 mg tablet taken TWICE daily with food for the FIRST 21 DAYS then on 06/06/2017  the dose is changed to one 20 mg tablet taken ONCE A DAY with your evening meal.  DO NOT stop taking Xarelto without talking to the health care provider who prescribed the  medication.  Refill your prescription for 20 mg tablets before you run out.  After discharge, you should have regular check-up appointments with your healthcare provider that is prescribing your Xarelto.  In the future your dose may need to be changed if your kidney function changes by a significant amount.  What do you do if you miss a dose? If you are taking Xarelto TWICE DAILY and you miss a dose, take it as soon as you remember. You may take two 15 mg tablets (total 30 mg) at the same time then resume your regularly scheduled 15 mg twice daily the next day.  If you are taking Xarelto ONCE DAILY and you miss a dose, take it as soon as you remember on the same day then continue your regularly scheduled once daily regimen the next day. Do not take two doses of Xarelto at the same time.   Important Safety Information Xarelto is a blood thinner medicine that can cause bleeding. You should call your healthcare provider right away if you experience any of the following: ? Bleeding from an injury or your nose that does not stop. ? Unusual colored urine (red or dark brown) or unusual colored stools (red or black). ? Unusual bruising for unknown reasons. ? A serious fall or if you hit your head (even if there is no bleeding).  Some medicines may interact with Xarelto and might increase your risk of bleeding while on Xarelto. To help avoid this, consult your healthcare provider or pharmacist prior to using any new prescription or non-prescription medications, including herbals, vitamins, non-steroidal anti-inflammatory drugs (NSAIDs) and supplements.  This website has more information on Xarelto: [email or web address]

## 2017-05-18 NOTE — Progress Notes (Addendum)
Benefit check for Xarelto in progress - New Xarelto 15mg  bid x 21 days then 20mg  daily starting 06/06/17; Aneta Mins [phone]  RE: Benefit check  Received: Today  Message Contents  Lauris Chroman, RN        # 3. S/W JAMIE @ [facility] # [phone]    1. XARELTO 215 MG BID   COVER- YES  CO-PAY- ZERO DOLLARS  PRIOR AP[PROVAL- NO   2. XARELTO 20 MG DAILY   COVER- YES  CO-PAY- ZERO DOLLARS  PRIOR APPROVAL- NO   PHARMACY - ANY RETAIL

## 2017-05-19 ENCOUNTER — Telehealth: Payer: Self-pay | Admitting: *Deleted

## 2017-05-19 NOTE — Telephone Encounter (Signed)
Had a very pleasant discussion w this patient. Went over his new meds in detail. Of concern/confusion was the initial dosing for the xarelto. He had not taken any since he got home but states he was going to start on the 20mg  tablet first per filling pharmacy's instruction.  I corrected him on this, and advised use of the 15mg  dose twice daily per standard initiation guidelines, once the 15mg  dose is gone to initiate 20mg  DAILY. (verified instruction w Erasmo Downer pharmD)  Pt verbalized understanding of this instruction, as well as instructions of use for furosemide, salt avoidance, etc. Pt expressed thanks for call. He is aware to call if further concerns or new questions prior to next OV, which I did confirm for him. -------------------------------  Patient contacted regarding discharge from [facility] on 9/19.  Patient understands to follow up with provider Rosaria Ferries, PA on 06/03/17 at 1:30pm at [facility]. Patient understands discharge instructions? Yes Patient understands medications and regimen? Yes Patient understands to bring all medications to this visit? Yes

## 2017-05-19 NOTE — Discharge Summary (Signed)
Patient ID: [name] [name],  MRN: [id number], DOB/AGE: [birth year] 74 y.o.  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   He was seen in [facility] 05/13/2017 for right lower extremity swelling, pain and erythema for about a week and a half, this was associated with shortness of breath. ECG showed atrial fibrillation, heart rate 90. Patient was sent to the ER but left before being seen after waiting 5 hours.   He was then seen in the office by Dr Sallyanne Kuster on 05/16/17. Dopplers were positive for DVT and he was felt to be in CHF. He was admitted and started on Xarelto and IV diuretics. Adm wgt was 316 lbs. He diuresed 16 lbs and when I went to see him on 05/18/17 on rounds he was dressed and [packing saying he felt better and was going home. He was seen by Dr Debara Pickett who felt the pt could go home but will need close follow up and a repeat echo in 1-3 months to evaluate his MR.   Before I could discharge the pt I was called by the RN saying the pt had pulled out his IVs and smashed his telemetry box and walked out of the hospital. We'll try and contact him to make sure he got his Xarelto Rx's. He has a follow up with rhonda 06/03/17.    Discharge Vitals:    Blood pressure 118/78, pulse 67, temperature 97.7 F (36.5 C), temperature source Oral, resp. rate 18, height 5\' 11"  (1.803 m), weight (!) 301 lb (136.5 kg), SpO2 93 %.    Labs: No results found for this or any previous visit (from the past 24 hour(s)).  Disposition:  Follow-up Information    Barrett, Evelene Croon, PA-C Follow up on 06/03/2017.   Specialties:  Cardiology, Radiology Why:  1:30  Contact information: [place] [address] [phone]           Discharge Medications:  Allergies as of 05/18/2017   No Known Allergies     Medication List    STOP taking these medications   ANDROGEL PUMP 20.25 MG/ACT (1.62%) Gel Generic drug:  Testosterone   HYDROcodone-acetaminophen 5-325 MG tablet Commonly known as:  NORCO/VICODIN     TAKE these medications   acetaminophen 500 MG tablet Commonly known as:  TYLENOL Take 1,000 mg by mouth every 6 (six) hours as needed for headache.   aspirin 81 MG chewable tablet Chew 1 tablet (81 mg total) by mouth daily.   atorvastatin 40 MG tablet Commonly known as:  LIPITOR Take 1 tablet (40 mg total) by mouth daily at 6 PM.   celecoxib 200 MG capsule Commonly known as:  CELEBREX Take 1 capsule (200 mg total) by mouth 2 (two) times daily as  needed for moderate pain. What changed:  when to take this  reasons to take this   clonazePAM 1 MG tablet Commonly known as:  KLONOPIN Take 2 tablets (2 mg total) by mouth at bedtime.   diclofenac sodium 1 % Gel Commonly known as:  VOLTAREN Apply 4 g topically 4 (four) times daily as needed (for leg soreness).   furosemide 20 MG tablet Commonly known as:  LASIX Take 1 tablet (20 mg total) by mouth daily.   multivitamin with minerals Tabs tablet Take 1 tablet by mouth daily. What changed:  how much to take   nitroGLYCERIN 0.4 MG SL tablet Commonly known as:  NITROSTAT Place 1 tablet (0.4 mg total) under the tongue every 5 (five) minutes as needed for chest  pain (Max 3 doses within 15 minutes. Call 911 at 3rd dose).   oxyCODONE-acetaminophen 10-325 MG tablet Commonly known as:  PERCOCET Take 1 tablet by mouth every 4 (four) hours as needed for pain.   polyethylene glycol packet Commonly known as:  MIRALAX / GLYCOLAX Take 17 g by mouth daily.   primidone 50 MG tablet Commonly known as:  MYSOLINE Take 50 mg by mouth 3 (three) times daily as needed (for shakes).   Rivaroxaban 15 MG Tabs tablet Commonly known as:  XARELTO Take 1 tablet (15 mg total) by mouth 2 (two) times daily with a meal.   rivaroxaban 20 MG Tabs tablet Commonly known as:  XARELTO Take 1 tablet (20 mg total) by mouth daily with supper. Start taking on:  06/06/2017   rOPINIRole 1 MG tablet Commonly known as:  REQUIP TAKE 2 TABLETS BY MOUTH AT BEDTIME What changed:  See the new instructions.   ROZEREM 8 MG tablet Generic drug:  ramelteon Take 8 mg by mouth at bedtime as needed for sleep.   tamsulosin 0.4 MG Caps capsule Commonly known as:  FLOMAX Take 1 capsule (0.4 mg total) by mouth daily.   tiZANidine 4 MG tablet Commonly known as:  ZANAFLEX Take 4 mg by mouth 4 (four) times daily as needed for muscle spasms.   tobramycin-dexamethasone ophthalmic solution Commonly known as:  TOBRADEX Place 2 drops into both eyes 4 (four) times daily as needed. (eye burning)            Discharge Care Instructions        Start     Ordered   06/06/17 0000  rivaroxaban (XARELTO) 20 MG TABS tablet  Daily with supper    Question:  Supervising Provider  Answer:  Lorretta Harp   05/18/17 1727   05/19/17 0000  Multiple Vitamin (MULTIVITAMIN WITH MINERALS) TABS tablet  Daily    Question:  Supervising Provider  Answer:  Lorretta Harp   05/18/17 1727   05/18/17 0000  atorvastatin (LIPITOR) 40 MG tablet  Daily-1800    Question:  Supervising Provider  Answer:  Lorretta Harp   05/18/17 1727   05/18/17 0000  celecoxib (CELEBREX) 200 MG capsule  2 times daily PRN     Question:  Supervising Provider  Answer:  Lorretta Harp   05/18/17 1727   05/18/17 0000  nitroGLYCERIN (NITROSTAT) 0.4 MG SL tablet  Every 5 min PRN    Question:  Supervising Provider  Answer:  Lorretta Harp   05/18/17 1727   05/18/17 0000  Rivaroxaban (XARELTO) 15 MG TABS tablet  2 times daily with meals    Question:  Supervising Provider  Answer:  Lorretta Harp   05/18/17 1727  Duration of Discharge Encounter: Greater than 30 minutes including physician time.  Angelena Form PA-C 05/19/2017 2:44 PM

## 2017-05-19 NOTE — Telephone Encounter (Signed)
-----   Message from Erlene Quan, [place] sent at 05/19/2017  2:57 PM EDT ----- Can you please call this pt and make sure he has his Xarelto Rx and knows to see Rhonda 10/5. He got upset with [place] and left the hospital last pm.  Kerin Ransom PA-C 05/19/2017 2:58 PM

## 2017-05-20 NOTE — Telephone Encounter (Signed)
Thanks Ovid Curd!! Good thing we called him. He's been a little hard to handle.  Kerin Ransom PA-C 05/20/2017 6:55 AM

## 2017-06-03 ENCOUNTER — Encounter: Payer: Self-pay | Admitting: Physician Assistant

## 2017-06-03 ENCOUNTER — Ambulatory Visit (INDEPENDENT_AMBULATORY_CARE_PROVIDER_SITE_OTHER): Payer: Medicare Other | Admitting: Physician Assistant

## 2017-06-03 VITALS — BP 114/78 | HR 89 | Ht 71.0 in | Wt 309.0 lb

## 2017-06-03 DIAGNOSIS — I481 Persistent atrial fibrillation: Secondary | ICD-10-CM | POA: Diagnosis not present

## 2017-06-03 DIAGNOSIS — I5021 Acute systolic (congestive) heart failure: Secondary | ICD-10-CM | POA: Diagnosis not present

## 2017-06-03 DIAGNOSIS — I82403 Acute embolism and thrombosis of unspecified deep veins of lower extremity, bilateral: Secondary | ICD-10-CM

## 2017-06-03 DIAGNOSIS — I4819 Other persistent atrial fibrillation: Secondary | ICD-10-CM

## 2017-06-03 NOTE — Patient Instructions (Signed)
Medication Instructions:  NO CHANGES If you need a refill on your cardiac medications before your next appointment, please call your pharmacy.  Follow-Up: Your physician wants you to follow-up in: 1 MONTH WITH DR HILTY OR IF UNAVAILABLE [place].   Special Instructions: RECORD/LOG WEIGHT DAILY  CONTINUE LOW SODIUM DIET  ENCOURAGE THE 4 CIGARETTES TO 0 CIGARETTES BY NEXT VISIT    Thank you for choosing [facility]!!

## 2017-06-03 NOTE — Progress Notes (Addendum)
Cardiology Office Note   Date:  06/03/2017   ID:  [name] [name], DOB 1941/08/29, MRN [id number]  PCP:  Sinda Du, MD  Cardiologist:  Dr. Debara Pickett 03/03/2017 Rosaria Ferries, PA-C 05/16/2017   History of Present Illness: [name] [name] is a 76 y.o. 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   Office visit 09/17 for shortness of breath>> Dopplers were positive for bilateral DVT>> admitted for CHF/DVT. Patient was to be discharged on 09/19 but before the paperwork was completed the patient pulled out his IVs and smashed his telemetry box and left.  09/20 He was called and the Xarelto dose was clarified as well as other meds.  [name] [name] presents for cardiology follow up  His insurance company is sending him some scales. He will start weighing himself when he gets them.   He notes that he was 319 in the office before admission, 309 today. He was 301 just before leaving the hospital, early am with just a hospital gown on. He is breathing better. He still has LE edema. He urinates a great deal on the Lasix. He is watching the salt in what he eats.   He is not having any bleeding issues.   He has increased his activity and is doing well with this. He has not had any chest pain with activity.  He is down to 4 cigarettes a day.   Past Medical History:  Diagnosis Date  . Anxiety    situational  . Bilateral lower extremity edema   . CAD (coronary artery disease) 10/23/14   s/p LAD DES / STEMI with CGS  . Cancer (HCC)    Mantle cell lymphoma  . Dyspnea    with exertion  . ED (erectile dysfunction)   . Hyperlipidemia   . Hypertension   . Insomnia   . Ischemic cardiomyopathy 10/24/14   EF 40-45% by echo  . Obesity    morbid  . OSA  (obstructive sleep apnea)    severe, on CPAP 12  . Osteoarthritis   . Peripheral edema   . Persistent atrial fibrillation ([place]) 05/16/2017  . Restless leg syndrome   . Rhinitis   . Tobacco abuse     Past Surgical History:  Procedure Laterality Date  . ANTERIOR LAT LUMBAR FUSION N/A 07/02/2016   Procedure: LUMBAR THREE - FOUR ANTERIOR LATERAL LUMBAR FUSION WITH PERCUTANEOUS SCREWS LEFT;  Surgeon: Earnie Larsson, MD;  Location: [place];  Service: Neurosurgery;  Laterality: N/A;  . BACK SURGERY  10/2009  . CORONARY ANGIOPLASTY    . EYE SURGERY     lazer bil  . HEMORRHOID SURGERY    . INTRA-AORTIC BALLOON PUMP INSERTION  10/23/2014   Procedure: INTRA-AORTIC BALLOON PUMP INSERTION;  Surgeon: Peter M Martinique, MD;  Location: [facility] CATH LAB;  Service: Cardiovascular;;  . LEFT HEART CATHETERIZATION WITH CORONARY ANGIOGRAM N/A 10/23/2014   Procedure: LEFT HEART CATHETERIZATION WITH CORONARY ANGIOGRAM;  Surgeon: Peter M Martinique, MD;  Location: [facility] LAB;  Service: Cardiovascular;  Laterality: N/A;  . LYMPH NODE BIOPSY Right 01/2014   groin area  . PERCUTANEOUS CORONARY STENT INTERVENTION (PCI-S)  10/23/2014   Procedure: PERCUTANEOUS CORONARY STENT INTERVENTION (PCI-S);  Surgeon: Peter M Martinique, MD;  Location: [facility] CATH LAB;  Service: Cardiovascular;;  . PORTACATH PLACEMENT Right 2015  . ROTATOR CUFF REPAIR  2011  . TOTAL KNEE ARTHROPLASTY     left  . TOTAL KNEE ARTHROPLASTY  08/04/2012   right knee  . TOTAL KNEE ARTHROPLASTY  08/04/2012   Procedure: TOTAL KNEE ARTHROPLASTY;  Surgeon: Yvette Rack., MD;  Location: [place];  Service: Orthopedics;  Laterality: Right;  WITH PATELLA RESURFACING    Current Outpatient Prescriptions  Medication Sig Dispense Refill  . acetaminophen (TYLENOL) 500 MG tablet Take 1,000 mg by mouth every 6 (six) hours as needed for headache.    Marland Kitchen aspirin 81 MG chewable tablet Chew 1 tablet (81 mg total) by mouth daily.    Marland Kitchen atorvastatin (LIPITOR) 40 MG tablet Take 1 tablet (40 mg  total) by mouth daily at 6 PM. 90 tablet 3  . celecoxib (CELEBREX) 200 MG capsule Take 1 capsule (200 mg total) by mouth 2 (two) times daily as needed for moderate pain.    . clonazePAM (KLONOPIN) 1 MG tablet Take 2 tablets (2 mg total) by mouth at bedtime. 60 tablet 2  . diclofenac sodium (VOLTAREN) 1 % GEL Apply 4 g topically 4 (four) times daily as needed (for leg soreness). 1 Tube 2  . furosemide (LASIX) 20 MG tablet Take 1 tablet (20 mg total) by mouth daily. 30 tablet 5  . Multiple Vitamin (MULTIVITAMIN WITH MINERALS) TABS tablet Take 1 tablet by mouth daily.    . nitroGLYCERIN (NITROSTAT) 0.4 MG SL tablet Place 1 tablet (0.4 mg total) under the tongue every 5 (five) minutes as needed for chest pain (Max 3 doses within 15 minutes. Call 911 at 3rd dose).  12  . oxyCODONE-acetaminophen (PERCOCET) 10-325 MG tablet Take 1 tablet by mouth every 4 (four) hours as needed for pain.     . polyethylene glycol (MIRALAX / GLYCOLAX) packet Take 17 g by mouth daily.    . Rivaroxaban (XARELTO) 15 MG TABS tablet Take 1 tablet (15 mg total) by mouth 2 (two) times daily with a meal. 42 tablet 0  . [START ON 06/06/2017] rivaroxaban (XARELTO) 20 MG TABS tablet Take 1 tablet (20 mg total) by mouth daily with supper. 30 tablet 11  . rOPINIRole (REQUIP) 1 MG tablet TAKE 2 TABLETS BY MOUTH AT BEDTIME (Patient taking differently: TAKE 2 MG BY MOUTH AT BEDTIME) 60 tablet 0  . ROZEREM 8 MG tablet Take 8 mg by mouth at bedtime as needed for sleep.   5  . tamsulosin (FLOMAX) 0.4 MG CAPS capsule Take 1 capsule (0.4 mg total) by mouth daily. 30 capsule 6  . tiZANidine (ZANAFLEX) 4 MG tablet Take 4 mg by mouth 4 (four) times daily as needed for muscle spasms.   12  . tobramycin-dexamethasone (TOBRADEX) ophthalmic solution Place 2 drops into both eyes 4 (four) times daily as needed. (eye burning)  2   No current facility-administered medications for this visit.     Allergies:   Patient has no known allergies.    Social  History:  The patient  reports that he has been smoking Cigarettes.  He has a 5.50 pack-year smoking history. He has never used smokeless tobacco. He reports that he does not drink alcohol or use drugs.   Family History:  The patient's family history includes Early death in his father; Unexplained death (age of onset: 42) in his mother.    ROS:  Please see the history of present illness. All other systems are reviewed and negative.  PHYSICAL EXAM: VS:  BP 114/78   Pulse 89   Ht 5\' 11"  (1.803 m)   Wt (!) 309 lb (140.2 kg)   SpO2 96%   BMI 43.10 kg/m  , BMI Body mass index is 43.1 kg/m. GEN: Well nourished, well developed, male in no acute distress  HEENT: normal for age  Neck: no JVD, no carotid bruit, no masses Cardiac: Irreg R&R; soft murmur, no rubs, or gallops Respiratory:  Decreased breath sounds bases bilaterally, normal work of breathing GI: soft, nontender, nondistended, + BS MS: no deformity or atrophy; 1-2+ edema R>L; distal pulses are 2+ in all 4 extremities   Skin: warm and dry, no rash Neuro:  Strength and sensation are intact Psych: euthymic mood, full affect   EKG:  EKG is not ordered today.  Recent Labs: 05/16/2017: ALT 20; B Natriuretic Peptide 652.0; BUN 14; Creatinine, Ser 1.02; Hemoglobin 13.5; Platelets 159; Potassium 3.9; Sodium 135; TSH 1.015    Lipid Panel    Component Value Date/Time   CHOL 127 05/17/2017 0507   TRIG 38 05/17/2017 0507   HDL 27 (L) 05/17/2017 0507   CHOLHDL 4.7 05/17/2017 0507   VLDL 8 05/17/2017 0507   LDLCALC 92 05/17/2017 0507     Wt Readings from Last 3 Encounters:  06/03/17 (!) 309 lb (140.2 kg)  05/18/17 (!) 301 lb (136.5 kg)  05/16/17 (!) 319 lb (144.7 kg)     Other studies Reviewed: Additional studies/ records that were reviewed today include: office notes, hospital records and testing.  ASSESSMENT AND PLAN:  1.  Acute on chronic systolic CHF: His I am status is improved, but I do not believe he is at baseline.  He is encouraged to continue the current Lasix dose. If he starts getting lightheaded or dizzy, he is to call us. He will start doing daily weights when he gets his scales. Once his volume is stable, add ACE/ARB if BP and renal function will tolerate.  2. Bilateral lower extremity DVT: He is still on the treatment doses Xarelto 15 mg twice a day and understands that when that is completed, he takes 20 mg once a day. His symptoms are improved.  3. Persistent atrial fibrillation: As his rate is controlled and he is still diuresing and his systolic blood pressure is 114, I will not also add a beta blocker at this time. At a beta blocker at his next office visit if his volume is improved and his blood pressure will tolerate it.   Current medicines are reviewed at length with the patient today.  The patient does not have concerns regarding medicines.  The following changes have been made:  no change  Labs/ tests ordered today include:  No orders of the defined types were placed in this encounter.    Disposition:   FU with Dr. Debara Pickett  Signed, Rosaria Ferries, PA-C  06/03/2017 5:47 PM    [place] Phone: [phone]; Fax: [phone]  This note was written with the assistance of speech recognition software. Please excuse any transcriptional errors.

## 2017-06-06 ENCOUNTER — Telehealth: Payer: Self-pay | Admitting: Internal Medicine

## 2017-06-06 NOTE — Telephone Encounter (Signed)
New message   1) Are you calling to confirm a diagnosis or obtain personal health information (Y/N)? yes  2) If so, what information is requested? Does pt has heart failure diagnosis or congestive heart failure  Please route to Medical Records or your medical records site representative

## 2017-07-07 ENCOUNTER — Ambulatory Visit: Payer: Medicare Other | Admitting: Internal Medicine

## 2017-07-25 ENCOUNTER — Ambulatory Visit (INDEPENDENT_AMBULATORY_CARE_PROVIDER_SITE_OTHER): Payer: Medicare Other | Admitting: Internal Medicine

## 2017-07-25 ENCOUNTER — Encounter: Payer: Self-pay | Admitting: Internal Medicine

## 2017-07-25 VITALS — BP 138/76 | HR 91 | Ht 71.0 in | Wt 319.0 lb

## 2017-07-25 DIAGNOSIS — M7989 Other specified soft tissue disorders: Secondary | ICD-10-CM | POA: Diagnosis not present

## 2017-07-25 DIAGNOSIS — I5021 Acute systolic (congestive) heart failure: Secondary | ICD-10-CM

## 2017-07-25 DIAGNOSIS — I481 Persistent atrial fibrillation: Secondary | ICD-10-CM | POA: Diagnosis not present

## 2017-07-25 DIAGNOSIS — I4819 Other persistent atrial fibrillation: Secondary | ICD-10-CM

## 2017-07-25 DIAGNOSIS — Z79899 Other long term (current) drug therapy: Secondary | ICD-10-CM

## 2017-07-25 DIAGNOSIS — F17200 Nicotine dependence, unspecified, uncomplicated: Secondary | ICD-10-CM

## 2017-07-25 MED ORDER — FUROSEMIDE 40 MG PO TABS
40.0000 mg | ORAL_TABLET | Freq: Two times a day (BID) | ORAL | 1 refills | Status: DC
Start: 2017-07-25 — End: 2017-07-29

## 2017-07-25 NOTE — Patient Instructions (Signed)
Your physician has recommended you make the following change in your medication:  -- INCREASE lasix to 40mg  twice daily  Your physician recommends that you return for lab work (BMET) Thursday November 29  Your physician recommends that you schedule a follow-up appointment on Friday November 30
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
## 2017-07-28 DIAGNOSIS — Z79899 Other long term (current) drug therapy: Secondary | ICD-10-CM | POA: Diagnosis not present

## 2017-07-29 ENCOUNTER — Other Ambulatory Visit: Payer: Self-pay | Admitting: *Deleted

## 2017-07-29 ENCOUNTER — Encounter: Payer: Self-pay | Admitting: Internal Medicine

## 2017-07-29 ENCOUNTER — Ambulatory Visit (INDEPENDENT_AMBULATORY_CARE_PROVIDER_SITE_OTHER): Payer: Medicare Other | Admitting: Internal Medicine

## 2017-07-29 VITALS — BP 130/82 | HR 90 | Ht 71.0 in | Wt 296.0 lb

## 2017-07-29 DIAGNOSIS — I4819 Other persistent atrial fibrillation: Secondary | ICD-10-CM

## 2017-07-29 DIAGNOSIS — I481 Persistent atrial fibrillation: Secondary | ICD-10-CM

## 2017-07-29 DIAGNOSIS — I872 Venous insufficiency (chronic) (peripheral): Secondary | ICD-10-CM | POA: Diagnosis not present

## 2017-07-29 DIAGNOSIS — Z23 Encounter for immunization: Secondary | ICD-10-CM

## 2017-07-29 DIAGNOSIS — I5021 Acute systolic (congestive) heart failure: Secondary | ICD-10-CM

## 2017-07-29 DIAGNOSIS — I8310 Varicose veins of unspecified lower extremity with inflammation: Secondary | ICD-10-CM | POA: Diagnosis not present

## 2017-07-29 DIAGNOSIS — M793 Panniculitis, unspecified: Secondary | ICD-10-CM

## 2017-07-29 LAB — BASIC METABOLIC PANEL
BUN / CREAT RATIO: 17 (ref 10–24)
BUN: 20 mg/dL (ref 8–27)
CHLORIDE: 100 mmol/L (ref 96–106)
CO2: 27 mmol/L (ref 20–29)
CREATININE: 1.2 mg/dL (ref 0.76–1.27)
Calcium: 9.7 mg/dL (ref 8.6–10.2)
GFR calc Af Amer: 68 mL/min/{1.73_m2} (ref >59)
GFR calc non Af Amer: 59 mL/min/{1.73_m2} — ABNORMAL LOW (ref >59)
GLUCOSE: 103 mg/dL — AB (ref 65–99)
Potassium: 4.8 mmol/L (ref 3.5–5.2)
SODIUM: 146 mmol/L — AB (ref 134–144)

## 2017-07-29 MED ORDER — TRIAMCINOLONE ACETONIDE 0.1 % EX OINT: 1.0000 "application " | TOPICAL_OINTMENT | Freq: Two times a day (BID) | CUTANEOUS | 0 refills | Status: DC

## 2017-07-29 NOTE — Patient Instructions (Addendum)
Your physician has recommended you make the following change in your medication:  -- DECREASE lasix to 40mg  daily -- follow sliding scale (below) for weight gain -- START triamcinolone ointment (KENALOG)   Sliding scale Lasix: Weigh yourself when you get home, then Daily in the Morning. Your dry weight will be what your scale says on the day you return home. (Here is 296 lbs.).   If you gain more than 3 pounds from dry weight: Increase the Lasix dosing to 40 mg in the morning and 20 mg in the afternoon until weight returns to baseline dry weight.  If weight gain is greater than 5 pounds in 2 days: Increased to Lasix 40 mg twice a day and contact the office for further assistance if weight does not go down the next day.  If the weight goes down more than 3 pounds from dry weight: Hold Lasix until it returns to baseline dry weight  Your physician recommends that you schedule a follow-up appointment in [place] with Dr. Debara Pickett.
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
## 2017-08-01 DIAGNOSIS — Z23 Encounter for immunization: Secondary | ICD-10-CM | POA: Insufficient documentation

## 2017-08-09 ENCOUNTER — Other Ambulatory Visit: Payer: Self-pay | Admitting: Cardiology

## 2017-08-10 MED ORDER — RIVAROXABAN 20 MG PO TABS: 20.0000 mg | ORAL_TABLET | Freq: Every day | ORAL | 3 refills | Status: AC

## 2017-08-28 ENCOUNTER — Other Ambulatory Visit: Payer: Self-pay | Admitting: Internal Medicine

## 2017-08-28 DIAGNOSIS — M793 Panniculitis, unspecified: Secondary | ICD-10-CM

## 2017-08-28 DIAGNOSIS — I8310 Varicose veins of unspecified lower extremity with inflammation: Secondary | ICD-10-CM

## 2017-08-31 NOTE — Telephone Encounter (Signed)
LMTCB to determine if triamcinolone was effective for patient. Med refilled by MD

## 2017-09-20 ENCOUNTER — Other Ambulatory Visit: Payer: Self-pay | Admitting: Internal Medicine

## 2017-09-20 DIAGNOSIS — I8310 Varicose veins of unspecified lower extremity with inflammation: Secondary | ICD-10-CM

## 2017-09-27 DIAGNOSIS — J449 Chronic obstructive pulmonary disease, unspecified: Secondary | ICD-10-CM | POA: Diagnosis not present

## 2017-09-27 DIAGNOSIS — I2510 Atherosclerotic heart disease of native coronary artery without angina pectoris: Secondary | ICD-10-CM | POA: Diagnosis not present

## 2017-09-27 DIAGNOSIS — M545 Low back pain: Secondary | ICD-10-CM | POA: Diagnosis not present

## 2017-09-28 DIAGNOSIS — Z79891 Long term (current) use of opiate analgesic: Secondary | ICD-10-CM | POA: Diagnosis not present

## 2017-10-04 ENCOUNTER — Telehealth: Payer: Self-pay | Admitting: Internal Medicine

## 2017-10-04 NOTE — Telephone Encounter (Signed)
Returned call to patient with MD's recommendations. He has no scale and no funds to purchase a scale. He states he will resume lasix as directed and keep MD follow up

## 2017-10-04 NOTE — Telephone Encounter (Signed)
Must resume lasix - monitor weight closely. Defer to PCP about symbicort. Will re-assess next week.  Dr. Lemmie Evens

## 2017-10-04 NOTE — Telephone Encounter (Signed)
Spoke with patient of Dr. Debara Pickett. He states he is having hard time breathing. His chest is full of mucus - he is using OTC mucinex - doesn't seem to provide much relief. He coughs frequently - has urinary incontinence when coughing too. He stopped lasix last week - he stopped d/t urinary incontinence - he also states it was not taking the fluid off his legs. He has swelling in his legs that is the same as his usual states, not worse. He does not weigh at home, he does not have a scale.  He saw PCP on Friday - weight was 320lbs (was 296lbs at November 2018 visit). He got a Rx for doxycyline 100mg  for a "cold" - he is unsure why he was prescribed this but is taking the medication. He had lab work done by PCP and does not yet have the results and I cannot access them in [place]. He states he called PCP office for results yesterday but PCP will not be back until Wednesday.  He is inquiring about Symbicort Rx for breathing issues.   Advised would send message to MD for recommendations. Patient aware that MD will likely suggest resuming lasix.   Patient has MD OV Oct 10, 2017

## 2017-10-04 NOTE — Telephone Encounter (Signed)
Please call,having a terrible time breathing,

## 2017-10-10 ENCOUNTER — Ambulatory Visit (INDEPENDENT_AMBULATORY_CARE_PROVIDER_SITE_OTHER): Payer: Medicare Other | Admitting: Internal Medicine

## 2017-10-10 ENCOUNTER — Encounter: Payer: Self-pay | Admitting: Internal Medicine

## 2017-10-10 VITALS — BP 133/85 | HR 111 | Ht 71.5 in | Wt 315.2 lb

## 2017-10-10 DIAGNOSIS — R3914 Feeling of incomplete bladder emptying: Secondary | ICD-10-CM

## 2017-10-10 DIAGNOSIS — I4819 Other persistent atrial fibrillation: Secondary | ICD-10-CM

## 2017-10-10 DIAGNOSIS — N401 Enlarged prostate with lower urinary tract symptoms: Secondary | ICD-10-CM

## 2017-10-10 DIAGNOSIS — Z79899 Other long term (current) drug therapy: Secondary | ICD-10-CM | POA: Diagnosis not present

## 2017-10-10 DIAGNOSIS — I255 Ischemic cardiomyopathy: Secondary | ICD-10-CM

## 2017-10-10 DIAGNOSIS — I481 Persistent atrial fibrillation: Secondary | ICD-10-CM

## 2017-10-10 DIAGNOSIS — I5021 Acute systolic (congestive) heart failure: Secondary | ICD-10-CM

## 2017-10-10 DIAGNOSIS — N400 Enlarged prostate without lower urinary tract symptoms: Secondary | ICD-10-CM | POA: Insufficient documentation

## 2017-10-10 MED ORDER — FUROSEMIDE 40 MG PO TABS: 40.0000 mg | ORAL_TABLET | Freq: Two times a day (BID) | ORAL | 5 refills | Status: DC

## 2017-10-10 MED ORDER — TAMSULOSIN HCL 0.4 MG PO CAPS: 0.8000 mg | ORAL_CAPSULE | Freq: Every day | ORAL | 5 refills | Status: DC
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
## 2017-10-10 NOTE — Patient Instructions (Signed)
Your physician has recommended you make the following change in your medication:  -- INCREASE lasix to 40mg  twice daily -- INCREASE tamsulosin to 0.8mg  daily  Your physician recommends that you return for lab work in [place] recommends that you schedule a follow-up appointment in 1-2 weeks with PA or NP

## 2017-10-17 NOTE — Progress Notes (Deleted)
Cardiology Office Note   Date:  10/17/2017   ID:  [name] [name], DOB 08-11-1941, MRN [id number]  PCP:  Sinda Du, MD  Cardiologist:  Dr. Debara Pickett No chief complaint on file.    History of Present Illness: [name] [name] is a 77 y.o. male who presents for ongoing assessment and management of CAD, history of angioplasty to unknown artery, peripheral arterial disease, claudication, lower extremity edema, with other history to include, ongoing tobacco abuse. Due to ongoing complaints of chest pain, the patient was scheduled for cardiac catheterization:   Coronary angiography: Coronary dominance: left  Left mainstem: Normal  Left anterior descending (LAD): The LAD is occluded 100% in the proximal vessel. The proximal to mid vessel is calcified.  Left circumflex (LCx): Large dominant vessel. It gives off a large bifurcating OM branch before terminating in the PL and PDA branches. The OM has a 60% stenosis prior to the bifurcation into 2 large OM branches. The more lateral branch has a 90% stenosis in the mid vessel.   Right coronary artery (RCA): The RCA is a nondominant vessel. There is a long segment of 80% stenosis in the proximal to mid vessel with a focal 80-90% stenosis in the mid vessel.  Left ventriculography: Not done due to elevated EDP.  PCI Note: Following the diagnostic procedure, the decision was made to proceed with PCI of the LAD. Weight-based bivalirudin was given for anticoagulation. The patient was initiated on IV Cangrelor. Once reperfusion was obtained he was loaded with po Brilinta. Once a therapeutic ACT was achieved, a 6 [place] XBLAD 3.5 guide catheter was inserted. A prowater coronary guidewire was used to cross the lesion. The lesion was predilated with a 2.5 mm balloon. The lesion was then stented with a 3.5 x 32 mm Synergy stent. The stent was postdilated with a 3.75 mm noncompliant balloon. Following PCI, there was 0% residual stenosis and  TIMI-3 flow. Final angiography confirmed an excellent result.   Unfortunately, post procedure the patient demonstrated persisting cardiogenic shock with hypotension elevated LVEDP. He was placed on IV Levothroid, also IABP. He had a lot of reperfusion ventricular ectopy. He then subsequently had stent placed to the LAD reducing stenosis from 100% to 0%. Final Conclusions:  1. Severe 3 vessel obstructive CAD. Culprit lesion is proximal LAD occlusion 2. Cardiogenic shock 3. Successful stenting of the proximal LAD with a DES.  4. IABP placement.   He was last seen by Dr. Debara Pickett, on 10/10/2017 as an urgent visit due to significant weight gain. He gained weight from 296 pounds to 315 pounds. He had significant swelling. It stopped taking his Lasix due to incontinence. He was diagnosed with acute on chronic systolic heart failure, cerebrovascular dermal sclerosis, was found to have new onset atrial fibrillation. The patient was advised to increase his Lasix 40 mg twice a day, and increase tamsulosin to 0.80 mg nightly. With several follow-up BMET in one week.  Past Medical History:  Diagnosis Date  . Anxiety    situational  . Bilateral lower extremity edema   . CAD (coronary artery disease) 10/23/14   s/p LAD DES / STEMI with CGS  . Cancer (HCC)    Mantle cell lymphoma  . Dyspnea    with exertion  . ED (erectile dysfunction)   . Hyperlipidemia   . Hypertension   . Insomnia   . Ischemic cardiomyopathy 10/24/14   EF 40-45% by echo  . Obesity    morbid  . OSA (obstructive sleep apnea)  severe, on CPAP 12  . Osteoarthritis   . Peripheral edema   . Persistent atrial fibrillation ([place]) 05/16/2017  . Restless leg syndrome   . Rhinitis   . Tobacco abuse     Past Surgical History:  Procedure Laterality Date  . ANTERIOR LAT LUMBAR FUSION N/A 07/02/2016   Procedure: LUMBAR THREE - FOUR ANTERIOR LATERAL LUMBAR FUSION WITH PERCUTANEOUS SCREWS LEFT;  Surgeon: Earnie Larsson, MD;  Location: [place];   Service: Neurosurgery;  Laterality: N/A;  . BACK SURGERY  10/2009  . CORONARY ANGIOPLASTY    . EYE SURGERY     lazer bil  . HEMORRHOID SURGERY    . INTRA-AORTIC BALLOON PUMP INSERTION  10/23/2014   Procedure: INTRA-AORTIC BALLOON PUMP INSERTION;  Surgeon: Peter M Martinique, MD;  Location[facility] C[facility] LAB;  Service: Cardiovascular;;  . LEFT HEART CATHETERIZATION WITH CORONARY ANGIOGRAM N/A 10/23/2014   Procedure: LEFT HEART CATHETERIZATION WITH CORONARY ANGIOGRAM;  Surgeon: Peter M Martinique, MD;  Location[facility] CATH LAB;  Service: Cardiovascular;  Laterality: N/A;  . LYMPH NODE BIOPSY Right 01/2014   groin area  . PERCUTANEOUS CORONARY STENT INTERVENTION (PCI-S)  10/23/2014   Procedure: PERCUTANEOUS CORONARY STENT INTERVENTION (PCI-S);  Surgeon: Peter M Martinique, MD;  Location: [facility] CATH LAB;  Service: Cardiovascular;;  . PORTACATH PLACEMENT Right 2015  . ROTATOR CUFF REPAIR  2011  . TOTAL KNEE ARTHROPLASTY     left  . TOTAL KNEE ARTHROPLASTY  08/04/2012   right knee  . TOTAL KNEE ARTHROPLASTY  08/04/2012   Procedure: TOTAL KNEE ARTHROPLASTY;  Surgeon: Yvette Rack., MD;  Location: [place];  Service: Orthopedics;  Laterality: Right;  WITH PATELLA RESURFACING     Current Outpatient Medications  Medication Sig Dispense Refill  . acetaminophen (TYLENOL) 500 MG tablet Take 1,000 mg by mouth every 6 (six) hours as needed for headache.    Marland Kitchen aspirin 81 MG chewable tablet Chew 1 tablet (81 mg total) by mouth daily.    Marland Kitchen atorvastatin (LIPITOR) 40 MG tablet Take 1 tablet (40 mg total) by mouth daily at 6 PM. 90 tablet 3  . celecoxib (CELEBREX) 200 MG capsule Take 1 capsule (200 mg total) by mouth 2 (two) times daily as needed for moderate pain.    . clonazePAM (KLONOPIN) 1 MG tablet Take 2 tablets (2 mg total) by mouth at bedtime. 60 tablet 2  . diclofenac sodium (VOLTAREN) 1 % GEL Apply 4 g topically 4 (four) times daily as needed (for leg soreness). 1 Tube 2  . furosemide (LASIX) 40 MG tablet Take 1 tablet (40  mg total) by mouth 2 (two) times daily. 60 tablet 5  . Multiple Vitamin (MULTIVITAMIN WITH MINERALS) TABS tablet Take 1 tablet by mouth daily.    . nitroGLYCERIN (NITROSTAT) 0.4 MG SL tablet Place 1 tablet (0.4 mg total) under the tongue every 5 (five) minutes as needed for chest pain (Max 3 doses within 15 minutes. Call 911 at 3rd dose).  12  . oxyCODONE-acetaminophen (PERCOCET) 10-325 MG tablet Take 1 tablet by mouth every 4 (four) hours as needed for pain.     . polyethylene glycol (MIRALAX / GLYCOLAX) packet Take 17 g by mouth daily.    . rivaroxaban (XARELTO) 20 MG TABS tablet Take 1 tablet (20 mg total) by mouth daily with supper. 30 tablet 3  . rOPINIRole (REQUIP) 1 MG tablet TAKE 2 TABLETS BY MOUTH AT BEDTIME (Patient taking differently: TAKE 2 MG BY MOUTH AT BEDTIME) 60 tablet 0  .  ROZEREM 8 MG tablet Take 8 mg by mouth at bedtime as needed for sleep.   5  . tamsulosin (FLOMAX) 0.4 MG CAPS capsule Take 2 capsules (0.8 mg total) by mouth daily. 60 capsule 5  . tiZANidine (ZANAFLEX) 4 MG tablet Take 4 mg by mouth 4 (four) times daily as needed for muscle spasms.   12  . tobramycin-dexamethasone (TOBRADEX) ophthalmic solution Place 2 drops into both eyes 4 (four) times daily as needed. (eye burning)  2  . triamcinolone ointment (KENALOG) 0.1 % APPLY TO AFFECTED AREA TWICE A DAY 30 g 0   No current facility-administered medications for this visit.     Allergies:   Patient has no known allergies.    Social History:  The patient  reports that he has been smoking cigarettes.  He has a 5.50 pack-year smoking history. he has never used smokeless tobacco. He reports that he does not drink alcohol or use drugs.   Family History:  The patient's family history includes Early death in his father; Unexplained death (age of onset: 43) in his mother.    ROS: All other systems are reviewed and negative. Unless otherwise mentioned in H&P    PHYSICAL EXAM: VS:  There were no vitals taken for this  visit. , BMI There is no height or weight on file to calculate BMI. GEN: Well nourished, well developed, in no acute distress  HEENT: normal  Neck: no JVD, carotid bruits, or masses Cardiac: ***RRR; no murmurs, rubs, or gallops,no edema  Respiratory:  clear to auscultation bilaterally, normal work of breathing GI: soft, nontender, nondistended, + BS MS: no deformity or atrophy  Skin: warm and dry, no rash Neuro:  Strength and sensation are intact Psych: euthymic mood, full affect   EKG:  EKG {ACTION; IS/IS [id number]} ordered today. The ekg ordered today demonstrates ***   Recent Labs: 05/16/2017: ALT 20; B Natriuretic Peptide 652.0; Hemoglobin 13.5; Platelets 159; TSH 1.015 07/28/2017: BUN 20; Creatinine, Ser 1.20; Potassium 4.8; Sodium 146    Lipid Panel    Component Value Date/Time   CHOL 127 05/17/2017 0507   TRIG 38 05/17/2017 0507   HDL 27 (L) 05/17/2017 0507   CHOLHDL 4.7 05/17/2017 0507   VLDL 8 05/17/2017 0507   LDLCALC 92 05/17/2017 0507      Wt Readings from Last 3 Encounters:  10/10/17 (!) 315 lb 3.2 oz (143 kg)  07/29/17 296 lb (134.3 kg)  07/25/17 (!) 319 lb (144.7 kg)      Other studies Reviewed: Additional studies/ records that were reviewed today include: ***. Review of the above records demonstrates: ***   ASSESSMENT AND PLAN:  1.  ***   Current medicines are reviewed at length with the patient today.    Labs/ tests ordered today include: *** Phill Myron. [place] Pugh, ANP, AACC   10/17/2017 7:30 AM    [facility] [address] [place],  [address] Phone: [phone]; Fax: [phone]

## 2017-10-19 ENCOUNTER — Encounter: Payer: Self-pay | Admitting: Adult Health

## 2017-10-19 ENCOUNTER — Ambulatory Visit: Payer: Medicare Other | Admitting: Adult Health

## 2017-10-19 ENCOUNTER — Telehealth: Payer: Self-pay | Admitting: *Deleted

## 2017-10-19 VITALS — BP 138/70 | HR 80 | Ht 71.0 in | Wt 301.8 lb

## 2017-10-19 DIAGNOSIS — I890 Lymphedema, not elsewhere classified: Secondary | ICD-10-CM | POA: Diagnosis not present

## 2017-10-19 DIAGNOSIS — M7989 Other specified soft tissue disorders: Secondary | ICD-10-CM

## 2017-10-19 DIAGNOSIS — Z79899 Other long term (current) drug therapy: Secondary | ICD-10-CM

## 2017-10-19 DIAGNOSIS — I255 Ischemic cardiomyopathy: Secondary | ICD-10-CM | POA: Diagnosis not present

## 2017-10-19 DIAGNOSIS — I2510 Atherosclerotic heart disease of native coronary artery without angina pectoris: Secondary | ICD-10-CM | POA: Diagnosis not present

## 2017-10-19 NOTE — Telephone Encounter (Signed)
[name] Sims, NP; PHD was seeing patient in the office today and requested for me to speak with the patient about a CPAP issue he is having. ( patient's CPAP managed by his PCP). Patient states that he has noticed some "white particles" floating around in the H2O chamber at night. He is concerned that he is inhaling the plastic from the chamber. I asked if he has contacted his [employer]) company , also how long has he had the machine and equipment. He tells me that he just got the machine and he feels like "their supply prices are too high." I stressed to him the importance of getting his supplies on a regular basis in order for the machine to function properly as well as deliver therapy as it should. I told him that I would call to speak with Anderson Malta and ask her to give him a call.   I called Anderson Malta @[employer]  while the patient is still in the office. She informed me the patient got his machine in 2017 and doesn't get supplies as recommended due to $$. She states that she had actually left him a message this morning following up on his therapy. She asked if I would have the patient to give her a call once leaving here as she will be out of office after today in a conference and cannot be reached. Information given to patient. He states that he will call her once he leaves our office.

## 2017-10-19 NOTE — Progress Notes (Signed)
Cardiology Office Note   Date:  10/19/2017   ID:  [name] [name], DOB 1940/09/07, MRN [id number]  PCP:  Pixie Casino, MD  Cardiologist:  Dr. Debara Pickett  Chief Complaint  Patient presents with  . Edema     History of Present Illness: [name] [name] is a 77 y.o. male who presents for ongoing assessment and management of symptoms of claudication, with known history of tobacco abuse, OSA on CPAP, Dr. Debara Pickett saw this patient on 10/10/2017 as a new patient. This note reported that he has no family history of heart disease, he is nondiabetic hypertensive or hyperlipidemic, and has had angioplasty in the past in unidentified artery.  The underwent bilateral venous reflux studies which indicated significant bilateral deep vein reflux as well as reflux of the bilateral greater saphenous veins. The left greater saphenous vein had a significant tributary vessel became off just above the left knee.  He reports bilateral leg heaviness, restlessness, achiness, significant swelling to the point where he cannot hardly get on pants  The patient had cardiac catheterization in the setting of chest pain and dyspnea, and was admitted to the hospital. Cardiac catheterization was completed emergently as he went into cardiogenic shock.  Cath 10/23/2014 Coronary dominance: left  Left mainstem: Normal  Left anterior descending (LAD): The LAD is occluded 100% in the proximal vessel. The proximal to mid vessel is calcified.  Left circumflex (LCx): Large dominant vessel. It gives off a large bifurcating OM branch before terminating in the PL and PDA branches. The OM has a 60% stenosis prior to the bifurcation into 2 large OM branches. The more lateral branch has a 90% stenosis in the mid vessel.   Right coronary artery (RCA): The RCA is a nondominant vessel. There is a long segment of 80% stenosis in the proximal to mid vessel with a focal 80-90% stenosis in the mid vessel.  Left ventriculography: Not done  due to elevated EDP.  PCI Note: Following the diagnostic procedure, the decision was made to proceed with PCI of the LAD. Weight-based bivalirudin was given for anticoagulation. The patient was initiated on IV Cangrelor. Once reperfusion was obtained he was loaded with po Brilinta. Once a therapeutic ACT was achieved, a 6 [place] XBLAD 3.5 guide catheter was inserted. A prowater coronary guidewire was used to cross the lesion. The lesion was predilated with a 2.5 mm balloon. The lesion was then stented with a 3.5 x 32 mm Synergy stent. The stent was postdilated with a 3.75 mm noncompliant balloon. Following PCI, there was 0% residual stenosis and TIMI-3 flow. Final angiography confirmed an excellent result.   During the procedure the patient demonstrated persistent cardiogenic shock with hypotension and elevated LVEDP. He was placed on IV levophed. The right groin was prepped and draped in a sterile fashion. The right femoral artery was accessed using a modified Seldinger technique. An IABP was placed and positioned distal the there left subclavian artery. With IABP and IV levophed his BP did improve. he did have a lot of ventricular ectopy with reperfusion but this improved by the end of procedure.   The patient tolerated the procedure well. There were no immediate procedural complications. A TR band was used for radial hemostasis. The patient was transferred to the post catheterization recovery area for further monitoring.  PCI Data: Vessel - LAD/Segment - proximal Percent Stenosis (pre) 100% TIMI-flow 0 Stent 3.5 x 32 mm Synergy stent Percent Stenosis (post) 0% TIMI-flow (post) 3  Final Conclusions:  1. Severe  3 vessel obstructive CAD. Culprit lesion is proximal LAD occlusion 2. Cardiogenic shock 3. Successful stenting of the proximal LAD with a DES.  4. IABP placement.    He was last seen by Dr. Debara Pickett on 10/10/2017 for an urgent visit. He reported significant weight gain  with lower extremity edema is not been taking his Lasix for a period time due to problems with incontinence. His EKG with our office the patient began taking Lasix again at 40 mg daily and had lost some weight.  EKG also revealed atrial fibrillation with RVR. He was recommended to increase his Lasix to 40 mg twice a day, and increase tamsulosin to 0.80 mg nightly. Weight on last office visit 315 pounds. . Comes today with several concerns. He has lost approximately 15 pounds Dr. Debara Pickett last with increased dose of Lasix. He continues to have some significant lower extremity edema on the right, with less edema on the left per patient report. He still does have some mild pretibial and ankle edema on the left. Right leg is so tight he is unable to lift his right pant leg for my examination.  He also complains that his CPAP machine is not working correctly. There is often a lot of debris in the pain, and tubing. He is currently eating treated or bronchial infection as well.  He has several questions concerning his heart function and if he needs to be on Lasix for the rest of the his life. States that that increased dose of the tamsulosin has improved his incontinence considerably.  Of substantial significance, the patient quit smoking on 09/18/2017. He quit cold [place] and is doing his best not to restart.  Past Medical History:  Diagnosis Date  . Anxiety    situational  . Bilateral lower extremity edema   . CAD (coronary artery disease) 10/23/14   s/p LAD DES / STEMI with CGS  . Cancer (HCC)    Mantle cell lymphoma  . Dyspnea    with exertion  . ED (erectile dysfunction)   . Hyperlipidemia   . Hypertension   . Insomnia   . Ischemic cardiomyopathy 10/24/14   EF 40-45% by echo  . Obesity    morbid  . OSA (obstructive sleep apnea)    severe, on CPAP 12  . Osteoarthritis   . Peripheral edema   . Persistent atrial fibrillation ([place]) 05/16/2017  . Restless leg syndrome   . Rhinitis   .  Tobacco abuse     Past Surgical History:  Procedure Laterality Date  . ANTERIOR LAT LUMBAR FUSION N/A 07/02/2016   Procedure: LUMBAR THREE - FOUR ANTERIOR LATERAL LUMBAR FUSION WITH PERCUTANEOUS SCREWS LEFT;  Surgeon: Earnie Larsson, MD;  Location: [facility];  Service: Neurosurgery;  Laterality: N/A;  . BACK SURGERY  10/2009  . CORONARY ANGIOPLASTY    . EYE SURGERY     lazer bil  . HEMORRHOID SURGERY    . INTRA-AORTIC BALLOON PUMP INSERTION  10/23/2014   Procedure: INTRA-AORTIC BALLOON PUMP INSERTION;  Surgeon: Peter M Martinique, MD;  Location: [facility] CATH LAB;  Service: Cardiovascular;;  . LEFT HEART CATHETERIZATION WITH CORONARY ANGIOGRAM N/A 10/23/2014   Procedure: LEFT HEART CATHETERIZATION WITH CORONARY ANGIOGRAM;  Surgeon: Peter M Martinique, MD;  Location: [facility] CATH LAB;  Service: Cardiovascular;  Laterality: N/A;  . LYMPH NODE BIOPSY Right 01/2014   groin area  . PERCUTANEOUS CORONARY STENT INTERVENTION (PCI-S)  10/23/2014   Procedure: PERCUTANEOUS CORONARY STENT INTERVENTION (PCI-S);  Surgeon: Peter M Martinique, MD;  Location:  [facility] CATH LAB;  Service: Cardiovascular;;  . PORTACATH PLACEMENT Right 2015  . ROTATOR CUFF REPAIR  2011  . TOTAL KNEE ARTHROPLASTY     left  . TOTAL KNEE ARTHROPLASTY  08/04/2012   right knee  . TOTAL KNEE ARTHROPLASTY  08/04/2012   Procedure: TOTAL KNEE ARTHROPLASTY;  Surgeon: Yvette Rack., MD;  Location: [facility];  Service: Orthopedics;  Laterality: Right;  WITH PATELLA RESURFACING     Current Outpatient Medications  Medication Sig Dispense Refill  . acetaminophen (TYLENOL) 500 MG tablet Take 1,000 mg by mouth every 6 (six) hours as needed for headache.    Marland Kitchen aspirin 81 MG chewable tablet Chew 1 tablet (81 mg total) by mouth daily.    Marland Kitchen atorvastatin (LIPITOR) 40 MG tablet Take 1 tablet (40 mg total) by mouth daily at 6 PM. 90 tablet 3  . celecoxib (CELEBREX) 200 MG capsule Take 1 capsule (200 mg total) by mouth 2 (two) times daily as needed for moderate pain.    . clonazePAM  (KLONOPIN) 1 MG tablet Take 2 tablets (2 mg total) by mouth at bedtime. 60 tablet 2  . diclofenac sodium (VOLTAREN) 1 % GEL Apply 4 g topically 4 (four) times daily as needed (for leg soreness). 1 Tube 2  . furosemide (LASIX) 40 MG tablet Take 1 tablet (40 mg total) by mouth 2 (two) times daily. 60 tablet 5  . Multiple Vitamin (MULTIVITAMIN WITH MINERALS) TABS tablet Take 1 tablet by mouth daily.    . nitroGLYCERIN (NITROSTAT) 0.4 MG SL tablet Place 1 tablet (0.4 mg total) under the tongue every 5 (five) minutes as needed for chest pain (Max 3 doses within 15 minutes. Call 911 at 3rd dose).  12  . oxyCODONE-acetaminophen (PERCOCET) 10-325 MG tablet Take 1 tablet by mouth every 4 (four) hours as needed for pain.     . polyethylene glycol (MIRALAX / GLYCOLAX) packet Take 17 g by mouth daily.    . rivaroxaban (XARELTO) 20 MG TABS tablet Take 1 tablet (20 mg total) by mouth daily with supper. 30 tablet 3  . rOPINIRole (REQUIP) 1 MG tablet TAKE 2 TABLETS BY MOUTH AT BEDTIME (Patient taking differently: TAKE 2 MG BY MOUTH AT BEDTIME) 60 tablet 0  . ROZEREM 8 MG tablet Take 8 mg by mouth at bedtime as needed for sleep.   5  . tamsulosin (FLOMAX) 0.4 MG CAPS capsule Take 2 capsules (0.8 mg total) by mouth daily. 60 capsule 5  . tiZANidine (ZANAFLEX) 4 MG tablet Take 4 mg by mouth 4 (four) times daily as needed for muscle spasms.   12  . tobramycin-dexamethasone (TOBRADEX) ophthalmic solution Place 2 drops into both eyes 4 (four) times daily as needed. (eye burning)  2  . triamcinolone ointment (KENALOG) 0.1 % APPLY TO AFFECTED AREA TWICE A DAY 30 g 0   No current facility-administered medications for this visit.     Allergies:   Patient has no known allergies.    Social History:  The patient  reports that he has been smoking cigarettes.  He has a 5.50 pack-year smoking history. he has never used smokeless tobacco. He reports that he does not drink alcohol or use drugs.   Family History:  The patient's  family history includes Early death in his father; Unexplained death (age of onset: 106) in his mother.    ROS: All other systems are reviewed and negative. Unless otherwise mentioned in H&P    PHYSICAL EXAM: VS:  Ht 5'  11" (1.803 m)   Wt (!) 301 lb 12.8 oz (136.9 kg)   BMI 42.09 kg/m  , BMI Body mass index is 42.09 kg/m. GEN: Well nourished, well developed, in no acute distress Obese  HEENT: normal  Neck: no JVD, carotid bruits, or masses Cardiac: RRR; no murmurs, rubs, or gallops Bilateral edema  Respiratory:  clear to auscultation bilaterally, normal work of breathing GI: soft, nontender, nondistended, + BS MS: no deformity or atrophy Massive lymphedema of the right LE. Skin: warm and dry, no rash Neuro:  Strength and sensation are intact Psych: euthymic mood, full affect   Recent Labs: 05/16/2017: ALT 20; B Natriuretic Peptide 652.0; Hemoglobin 13.5; Platelets 159; TSH 1.015 07/28/2017: BUN 20; Creatinine, Ser 1.20; Potassium 4.8; Sodium 146    Lipid Panel    Component Value Date/Time   CHOL 127 05/31/2017 0507   TRIG 38 2017-05-31 0507   HDL 27 (L) 05/31/2017 0507   CHOLHDL 4.7 05/31/17 0507   VLDL 8 05/31/2017 0507   LDLCALC 92 05-31-17 0507      Wt Readings from Last 3 Encounters:  10/19/17 (!) 301 lb 12.8 oz (136.9 kg)  10/10/17 (!) 315 lb 3.2 oz (143 kg)  07/29/17 296 lb (134.3 kg)      Other studies Reviewed: Echocardiogram May 31, 2017 Left ventricle: The cavity size was normal. Wall thickness was   normal. Systolic function was mildly to moderately reduced. The   estimated ejection fraction was in the range of 40% to 45%.   Hypokinesis of the mid-apicalanteroseptal, anterior, and   anterolateral myocardium; consistent with ischemia in the   distribution of the left anterior descending coronary artery. No   evidence of thrombus. - Mitral valve: There was at least moderate to severe   regurgitation. Valve area by continuity equation (using LVOT    flow): 1.64 cm^2. - Left atrium: The atrium was moderately to severely dilated. - Right ventricle: The cavity size was moderately dilated. Wall   thickness was normal. Systolic function was mildly reduced. - Right atrium: The atrium was moderately to severely dilated.  Impressions:  - Images from March 30, 2017 study were also reviewed. There is no   change in LV function or wall motion. Pulmonary vein flow was not   recorded on the current study, but the previous study is   suspicious from systolic flow reversal in the pulmonary veins,   Suspect there is severe mitral insufficiency. TEE may be   indicated, especially if the patient is a candidate for mitral   valve repair. Compared to March 30, 2017, RV function is slightly   worse, both by visual estimation and by objective measurements   (TAPSE and RV annular DTI systolic velocity).  ASSESSMENT AND PLAN:  1. CAD: history of LAD stenosis with a drug-eluting stent in 2016, reduced EF of 40-45% per recent echo.  He offers no complaints of recurrent chest pain.  He is medically compliant.  2.Ischemic Cardiomyopathy The patient was increased to Lasix 40 mg twice daily and has lost approximately 15 pounds.  His scales weigh himself  2 pounds less than our scales.  He has had significant improvement in his lower extremity edema and offers no complaints of abdominal distention or worsening breathing.  However, he continues to have massive right leg lymphedema.  I am going to repeat his BMET and CBC today to evaluate kidney function and for anemia. He is advised on adhering to low sodium diet. He will need to avoid eating out especially  fast foods.   3.  Right leg lymphedema: The patient is being referred to [facility] lymphedema edema clinic, located in the OT division.  I would want their evaluation to make recommendations and possible SCD therapy.  Most recent lower extremity venous study was negative for DVT.   4.   Obstructive sleep apnea: Compliant with CPAP.  The patient is having trouble with machine, stating there is often debris, difficulty using it causing him to be uncomfortable.  He is trying to call his supplier and they have not returned his phone calls.  I am having [name] Laster RN speak with him to offer him recommendations and suggestions concerning having his CPAP evaluated and were replaced.  5. Tobacco abuse:  Current medicines are reviewed at length with the patient today.    Labs/ tests ordered today include: CBC BMET.  [name] [name]. [name] [name], [name] [name], [name] [name]   10/19/2017 10:14 AM    [facility]. [address] [place], [place] [address] Phone: [phone]; Fax: [phone]

## 2017-10-19 NOTE — Patient Instructions (Signed)
Medication Instructions:  NO CHANGES-Your physician recommends that you continue on your current medications as directed. Please refer to the Current Medication list given to you today.  If you need a refill on your cardiac medications before your next appointment, please call your pharmacy.  Labwork: BMET AND CBC TODAY HERE IN OUR OFFICE AT [facility]  Special Instructions: REFER TO LYMPHEDEMA CLINIC-[facility] PHYSICAL THERAPY-OT LYMPHEDEMA  Follow-Up: Your physician wants you to follow-up in: 6 MONTHS WITH DR HILTY You should receive a reminder letter in the mail two months in advance. If you do not receive a letter, please call our office 12-2017 to schedule the 03-2018 follow-up appointment.   Thank you for choosing [facility] at [facility]!!

## 2017-10-20 LAB — BASIC METABOLIC PANEL
BUN / CREAT RATIO: 15 (ref 10–24)
BUN: 18 mg/dL (ref 8–27)
CO2: 21 mmol/L (ref 20–29)
Calcium: 9.3 mg/dL (ref 8.6–10.2)
Chloride: 103 mmol/L (ref 96–106)
Creatinine, Ser: 1.21 mg/dL (ref 0.76–1.27)
GFR calc Af Amer: 67 mL/min/{1.73_m2} (ref >59)
GFR, EST NON AFRICAN AMERICAN: 58 mL/min/{1.73_m2} — AB (ref >59)
GLUCOSE: 89 mg/dL (ref 65–99)
POTASSIUM: 4.7 mmol/L (ref 3.5–5.2)
SODIUM: 144 mmol/L (ref 134–144)

## 2017-10-20 LAB — CBC
Hematocrit: 40.7 % (ref 37.5–51.0)
Hemoglobin: 13.4 g/dL (ref 13.0–17.7)
MCH: 31.1 pg (ref 26.6–33.0)
MCHC: 32.9 g/dL (ref 31.5–35.7)
MCV: 94 fL (ref 79–97)
PLATELETS: 149 10*3/uL — AB (ref 150–379)
RBC: 4.31 x10E6/uL (ref 4.14–5.80)
RDW: 16.6 % — AB (ref 12.3–15.4)
WBC: 4 10*3/uL (ref 3.4–10.8)

## 2017-10-31 ENCOUNTER — Ambulatory Visit: Payer: Medicare Other | Admitting: Occupational Therapy

## 2017-11-10 ENCOUNTER — Telehealth: Payer: Self-pay

## 2017-11-10 NOTE — Telephone Encounter (Signed)
Reached out to pt to schedule initial apt. Pt says due to his COPD it is hard for him to get active. Pt was unaware of Care Guide Program. Pt says that [facility] referred him to a program at [facility], but cannot afford copay. Will look for any resources that may be beneficial to pt.

## 2017-11-29 DIAGNOSIS — I509 Heart failure, unspecified: Secondary | ICD-10-CM | POA: Diagnosis not present

## 2017-11-29 DIAGNOSIS — M545 Low back pain: Secondary | ICD-10-CM | POA: Diagnosis not present

## 2017-11-29 DIAGNOSIS — J441 Chronic obstructive pulmonary disease with (acute) exacerbation: Secondary | ICD-10-CM | POA: Diagnosis not present

## 2017-11-29 DIAGNOSIS — I2510 Atherosclerotic heart disease of native coronary artery without angina pectoris: Secondary | ICD-10-CM | POA: Diagnosis not present

## 2017-12-05 ENCOUNTER — Telehealth: Payer: Self-pay | Admitting: Internal Medicine

## 2017-12-05 NOTE — Telephone Encounter (Signed)
NEW MESSAGE  Patient has concerns about being SOB. Patient is also requesting additional testing or a referral to EP - Dr Caryl Comes  Pt c/o Shortness Of Breath: STAT if SOB developed within the last 24 hours or pt is noticeably SOB on the phone  1. Are you currently SOB (can you hear that pt is SOB on the phone)? Yes  2. How long have you been experiencing SOB? 1 month  3. Are you SOB when sitting or when up moving around? Moving around  4. Are you currently experiencing any other symptoms? No

## 2017-12-05 NOTE — Telephone Encounter (Signed)
Returned call to patient's with MD recommendations. Advised he can take lasix in the AM and around 130-2pm. Stressed importance of compliance with diuretic to help with swelling, weight gain (if any), shortness of breath. Asked that he call back towards end of week if symptoms have not improved.

## 2017-12-05 NOTE — Telephone Encounter (Signed)
He needs to take the lasix twice daily -  Can be the morning and early afternoon. It lasts 6 hours and his increased urinary frequency at night should not be affected by this. The prednisone is likely also contributing to fluid retention.  Dr. Lemmie Evens

## 2017-12-05 NOTE — Telephone Encounter (Signed)
Spoke with patient and he has been having shortness of breath since about January. He has only been taking his Lasix 3-4 times a week secondary to the increased urinating. He continues to have swelling  Stated he would restart his  Lasix daily but can not take an evening dose secondary to keeping him up at night. I did explain that taking the Lasix daily helps keep the fluid off and too much fluid can cause shortness of breath. He does not weigh himself at home. His weight at PCP last week was around 315. He was given Amoxil and Prednisone dose pack by PCP and went back 12/02/17 for steroid injection. Patient stated that something is wrong and he wanted to find out what so it could be fixed. Will forward to Dr Debara Pickett for review

## 2017-12-09 ENCOUNTER — Telehealth: Payer: Self-pay | Admitting: Internal Medicine

## 2017-12-09 NOTE — Telephone Encounter (Signed)
Called pt to inform him of home scale, scheduled him an apt for 4/17 at 3 to have initial session

## 2017-12-14 ENCOUNTER — Ambulatory Visit: Payer: Medicare Other

## 2017-12-20 DIAGNOSIS — G4733 Obstructive sleep apnea (adult) (pediatric): Secondary | ICD-10-CM | POA: Diagnosis not present

## 2017-12-26 ENCOUNTER — Encounter (HOSPITAL_COMMUNITY): Payer: Self-pay | Admitting: *Deleted

## 2017-12-26 ENCOUNTER — Inpatient Hospital Stay (HOSPITAL_COMMUNITY)
Admission: AD | Admit: 2017-12-26 | Discharge: 2017-12-30 | DRG: 291 | Disposition: A | Discharge status: Home / Self Care | Payer: Medicare Other | Source: Ambulatory Visit | Attending: Pulmonary Disease | Admitting: Pulmonary Disease

## 2017-12-26 ENCOUNTER — Inpatient Hospital Stay (HOSPITAL_COMMUNITY): Payer: Medicare Other

## 2017-12-26 ENCOUNTER — Other Ambulatory Visit: Payer: Self-pay

## 2017-12-26 DIAGNOSIS — G2581 Restless legs syndrome: Secondary | ICD-10-CM | POA: Diagnosis not present

## 2017-12-26 DIAGNOSIS — Z79899 Other long term (current) drug therapy: Secondary | ICD-10-CM

## 2017-12-26 DIAGNOSIS — N400 Enlarged prostate without lower urinary tract symptoms: Secondary | ICD-10-CM | POA: Diagnosis present

## 2017-12-26 DIAGNOSIS — I2510 Atherosclerotic heart disease of native coronary artery without angina pectoris: Secondary | ICD-10-CM | POA: Diagnosis present

## 2017-12-26 DIAGNOSIS — I509 Heart failure, unspecified: Secondary | ICD-10-CM

## 2017-12-26 DIAGNOSIS — G8929 Other chronic pain: Secondary | ICD-10-CM | POA: Diagnosis present

## 2017-12-26 DIAGNOSIS — M48061 Spinal stenosis, lumbar region without neurogenic claudication: Secondary | ICD-10-CM | POA: Diagnosis present

## 2017-12-26 DIAGNOSIS — I340 Nonrheumatic mitral (valve) insufficiency: Secondary | ICD-10-CM | POA: Diagnosis present

## 2017-12-26 DIAGNOSIS — R3915 Urgency of urination: Secondary | ICD-10-CM | POA: Diagnosis not present

## 2017-12-26 DIAGNOSIS — N183 Chronic kidney disease, stage 3 (moderate): Secondary | ICD-10-CM | POA: Diagnosis not present

## 2017-12-26 DIAGNOSIS — I482 Chronic atrial fibrillation: Secondary | ICD-10-CM | POA: Diagnosis not present

## 2017-12-26 DIAGNOSIS — G4733 Obstructive sleep apnea (adult) (pediatric): Secondary | ICD-10-CM | POA: Diagnosis present

## 2017-12-26 DIAGNOSIS — I872 Venous insufficiency (chronic) (peripheral): Secondary | ICD-10-CM | POA: Diagnosis present

## 2017-12-26 DIAGNOSIS — G4700 Insomnia, unspecified: Secondary | ICD-10-CM | POA: Diagnosis present

## 2017-12-26 DIAGNOSIS — R3914 Feeling of incomplete bladder emptying: Secondary | ICD-10-CM | POA: Diagnosis not present

## 2017-12-26 DIAGNOSIS — F5104 Psychophysiologic insomnia: Secondary | ICD-10-CM | POA: Diagnosis present

## 2017-12-26 DIAGNOSIS — N3941 Urge incontinence: Secondary | ICD-10-CM | POA: Diagnosis not present

## 2017-12-26 DIAGNOSIS — I481 Persistent atrial fibrillation: Secondary | ICD-10-CM | POA: Diagnosis present

## 2017-12-26 DIAGNOSIS — R350 Frequency of micturition: Secondary | ICD-10-CM | POA: Diagnosis present

## 2017-12-26 DIAGNOSIS — Z9989 Dependence on other enabling machines and devices: Secondary | ICD-10-CM | POA: Diagnosis not present

## 2017-12-26 DIAGNOSIS — I130 Hypertensive heart and chronic kidney disease with heart failure and stage 1 through stage 4 chronic kidney disease, or unspecified chronic kidney disease: Principal | ICD-10-CM | POA: Diagnosis present

## 2017-12-26 DIAGNOSIS — Z7901 Long term (current) use of anticoagulants: Secondary | ICD-10-CM

## 2017-12-26 DIAGNOSIS — F419 Anxiety disorder, unspecified: Secondary | ICD-10-CM | POA: Diagnosis present

## 2017-12-26 DIAGNOSIS — Z8572 Personal history of non-Hodgkin lymphomas: Secondary | ICD-10-CM

## 2017-12-26 DIAGNOSIS — E785 Hyperlipidemia, unspecified: Secondary | ICD-10-CM | POA: Diagnosis present

## 2017-12-26 DIAGNOSIS — N401 Enlarged prostate with lower urinary tract symptoms: Secondary | ICD-10-CM | POA: Diagnosis not present

## 2017-12-26 DIAGNOSIS — I5043 Acute on chronic combined systolic (congestive) and diastolic (congestive) heart failure: Secondary | ICD-10-CM | POA: Diagnosis not present

## 2017-12-26 DIAGNOSIS — Z6841 Body Mass Index (BMI) 40.0 and over, adult: Secondary | ICD-10-CM

## 2017-12-26 DIAGNOSIS — F17210 Nicotine dependence, cigarettes, uncomplicated: Secondary | ICD-10-CM | POA: Diagnosis present

## 2017-12-26 DIAGNOSIS — Z9114 Patient's other noncompliance with medication regimen: Secondary | ICD-10-CM | POA: Diagnosis not present

## 2017-12-26 DIAGNOSIS — I255 Ischemic cardiomyopathy: Secondary | ICD-10-CM | POA: Diagnosis present

## 2017-12-26 DIAGNOSIS — Z7982 Long term (current) use of aspirin: Secondary | ICD-10-CM

## 2017-12-26 DIAGNOSIS — I25118 Atherosclerotic heart disease of native coronary artery with other forms of angina pectoris: Secondary | ICD-10-CM | POA: Diagnosis not present

## 2017-12-26 DIAGNOSIS — I480 Paroxysmal atrial fibrillation: Secondary | ICD-10-CM | POA: Diagnosis not present

## 2017-12-26 DIAGNOSIS — I10 Essential (primary) hypertension: Secondary | ICD-10-CM | POA: Diagnosis not present

## 2017-12-26 DIAGNOSIS — I252 Old myocardial infarction: Secondary | ICD-10-CM

## 2017-12-26 DIAGNOSIS — R0602 Shortness of breath: Secondary | ICD-10-CM | POA: Diagnosis present

## 2017-12-26 DIAGNOSIS — Z96653 Presence of artificial knee joint, bilateral: Secondary | ICD-10-CM | POA: Diagnosis present

## 2017-12-26 DIAGNOSIS — R351 Nocturia: Secondary | ICD-10-CM | POA: Diagnosis present

## 2017-12-26 DIAGNOSIS — I4819 Other persistent atrial fibrillation: Secondary | ICD-10-CM | POA: Diagnosis present

## 2017-12-26 DIAGNOSIS — Z955 Presence of coronary angioplasty implant and graft: Secondary | ICD-10-CM

## 2017-12-26 DIAGNOSIS — R911 Solitary pulmonary nodule: Secondary | ICD-10-CM | POA: Diagnosis not present

## 2017-12-26 DIAGNOSIS — J441 Chronic obstructive pulmonary disease with (acute) exacerbation: Secondary | ICD-10-CM | POA: Diagnosis not present

## 2017-12-26 DIAGNOSIS — J90 Pleural effusion, not elsewhere classified: Secondary | ICD-10-CM | POA: Diagnosis not present

## 2017-12-26 LAB — CBC WITH DIFFERENTIAL/PLATELET
BASOS ABS: 0 10*3/uL (ref 0.0–0.1)
Basophils Relative: 0 %
EOS ABS: 0.1 10*3/uL (ref 0.0–0.7)
EOS PCT: 1 %
HCT: 42.6 % (ref 39.0–52.0)
Hemoglobin: 13.2 g/dL (ref 13.0–17.0)
LYMPHS PCT: 13 %
Lymphs Abs: 0.6 10*3/uL — ABNORMAL LOW (ref 0.7–4.0)
MCH: 30.6 pg (ref 26.0–34.0)
MCHC: 31 g/dL (ref 30.0–36.0)
MCV: 98.6 fL (ref 78.0–100.0)
MONO ABS: 0.5 10*3/uL (ref 0.1–1.0)
Monocytes Relative: 11 %
Neutro Abs: 3.6 10*3/uL (ref 1.7–7.7)
Neutrophils Relative %: 75 %
PLATELETS: 180 10*3/uL (ref 150–400)
RBC: 4.32 MIL/uL (ref 4.22–5.81)
RDW: 18.7 % — AB (ref 11.5–15.5)
WBC: 4.8 10*3/uL (ref 4.0–10.5)

## 2017-12-26 LAB — COMPREHENSIVE METABOLIC PANEL
ALK PHOS: 141 U/L — AB (ref 38–126)
ALT: 21 U/L (ref 17–63)
AST: 23 U/L (ref 15–41)
Albumin: 4.4 g/dL (ref 3.5–5.0)
Anion gap: 13 (ref 5–15)
BILIRUBIN TOTAL: 2.5 mg/dL — AB (ref 0.3–1.2)
BUN: 18 mg/dL (ref 6–20)
CALCIUM: 9.5 mg/dL (ref 8.9–10.3)
CHLORIDE: 106 mmol/L (ref 101–111)
CO2: 23 mmol/L (ref 22–32)
CREATININE: 1.03 mg/dL (ref 0.61–1.24)
GFR calc Af Amer: >60 mL/min (ref >60)
Glucose, Bld: 111 mg/dL — ABNORMAL HIGH (ref 65–99)
Potassium: 4.9 mmol/L (ref 3.5–5.1)
Sodium: 142 mmol/L (ref 135–145)
TOTAL PROTEIN: 7.9 g/dL (ref 6.5–8.1)

## 2017-12-26 LAB — BRAIN NATRIURETIC PEPTIDE: B Natriuretic Peptide: 1808 pg/mL — ABNORMAL HIGH (ref 0.0–100.0)

## 2017-12-26 LAB — TSH: TSH: 1.378 u[IU]/mL (ref 0.350–4.500)

## 2017-12-26 LAB — MAGNESIUM: MAGNESIUM: 2 mg/dL (ref 1.7–2.4)

## 2017-12-26 MED ORDER — NITROGLYCERIN 0.4 MG SL SUBL: 0.4000 mg | SUBLINGUAL_TABLET | SUBLINGUAL | Status: DC | PRN

## 2017-12-26 MED ORDER — ROPINIROLE HCL 1 MG PO TABS
2.0000 mg | ORAL_TABLET | Freq: Every day | ORAL | Status: DC
  Administered 2017-12-26 – 2017-12-29 (×4): 2 mg via ORAL
  Filled 2017-12-26 (×4): qty 2

## 2017-12-26 MED ORDER — RIVAROXABAN 20 MG PO TABS
20.0000 mg | ORAL_TABLET | Freq: Every day | ORAL | Status: DC
  Administered 2017-12-26 – 2017-12-29 (×4): 20 mg via ORAL
  Filled 2017-12-26 (×4): qty 1

## 2017-12-26 MED ORDER — ONDANSETRON HCL 4 MG PO TABS: 4.0000 mg | ORAL_TABLET | Freq: Four times a day (QID) | ORAL | Status: DC | PRN

## 2017-12-26 MED ORDER — CLONAZEPAM 0.5 MG PO TABS
2.0000 mg | ORAL_TABLET | Freq: Every day | ORAL | Status: DC
  Administered 2017-12-26 – 2017-12-29 (×4): 2 mg via ORAL
  Filled 2017-12-26 (×4): qty 4

## 2017-12-26 MED ORDER — ACETAMINOPHEN 325 MG PO TABS
650.0000 mg | ORAL_TABLET | Freq: Four times a day (QID) | ORAL | Status: DC | PRN
  Administered 2017-12-27: 650 mg via ORAL
  Filled 2017-12-26: qty 2

## 2017-12-26 MED ORDER — OXYCODONE-ACETAMINOPHEN 5-325 MG PO TABS
1.0000 | ORAL_TABLET | ORAL | Status: DC | PRN
  Administered 2017-12-28 – 2017-12-29 (×2): 1 via ORAL
  Filled 2017-12-26 (×2): qty 1

## 2017-12-26 MED ORDER — TRIAMCINOLONE ACETONIDE 0.1 % EX OINT
TOPICAL_OINTMENT | Freq: Two times a day (BID) | CUTANEOUS | Status: DC
  Administered 2017-12-26 – 2017-12-30 (×7): via TOPICAL
  Filled 2017-12-26 (×2): qty 15

## 2017-12-26 MED ORDER — ACETAMINOPHEN 650 MG RE SUPP: 650.0000 mg | Freq: Four times a day (QID) | RECTAL | Status: DC | PRN

## 2017-12-26 MED ORDER — ONDANSETRON HCL 4 MG/2ML IJ SOLN: 4.0000 mg | Freq: Four times a day (QID) | INTRAMUSCULAR | Status: DC | PRN

## 2017-12-26 MED ORDER — POLYETHYLENE GLYCOL 3350 17 G PO PACK
17.0000 g | PACK | Freq: Every day | ORAL | Status: DC
  Administered 2017-12-27: 17 g via ORAL
  Filled 2017-12-26 (×5): qty 1

## 2017-12-26 MED ORDER — TAMSULOSIN HCL 0.4 MG PO CAPS
0.8000 mg | ORAL_CAPSULE | Freq: Every day | ORAL | Status: DC
  Administered 2017-12-27 – 2017-12-28 (×2): 0.8 mg via ORAL
  Filled 2017-12-26 (×3): qty 2

## 2017-12-26 MED ORDER — TRIAMCINOLONE ACETONIDE 0.1 % EX OINT
TOPICAL_OINTMENT | CUTANEOUS | Status: AC
  Filled 2017-12-26: qty 15

## 2017-12-26 MED ORDER — TIZANIDINE HCL 4 MG PO TABS
4.0000 mg | ORAL_TABLET | Freq: Four times a day (QID) | ORAL | Status: DC | PRN
  Administered 2017-12-27: 4 mg via ORAL
  Filled 2017-12-26: qty 1

## 2017-12-26 MED ORDER — ALBUTEROL SULFATE (2.5 MG/3ML) 0.083% IN NEBU: 2.5000 mg | INHALATION_SOLUTION | RESPIRATORY_TRACT | Status: DC | PRN

## 2017-12-26 MED ORDER — ASPIRIN 81 MG PO CHEW
81.0000 mg | CHEWABLE_TABLET | Freq: Every day | ORAL | Status: DC
  Administered 2017-12-26 – 2017-12-30 (×5): 81 mg via ORAL
  Filled 2017-12-26 (×5): qty 1

## 2017-12-26 MED ORDER — FUROSEMIDE 10 MG/ML IJ SOLN
80.0000 mg | Freq: Two times a day (BID) | INTRAMUSCULAR | Status: DC
  Administered 2017-12-26 – 2017-12-29 (×6): 80 mg via INTRAVENOUS
  Filled 2017-12-26 (×6): qty 8

## 2017-12-26 MED ORDER — ATORVASTATIN CALCIUM 40 MG PO TABS
40.0000 mg | ORAL_TABLET | Freq: Every day | ORAL | Status: DC
  Administered 2017-12-26 – 2017-12-29 (×4): 40 mg via ORAL
  Filled 2017-12-26: qty 2
  Filled 2017-12-26 (×3): qty 1

## 2017-12-26 MED ORDER — OXYCODONE HCL 5 MG PO TABS
5.0000 mg | ORAL_TABLET | ORAL | Status: DC | PRN
Start: 2017-12-26 — End: 2017-12-30
  Administered 2017-12-28 – 2017-12-29 (×2): 5 mg via ORAL
  Filled 2017-12-26 (×2): qty 1

## 2017-12-26 MED ORDER — SODIUM CHLORIDE 0.9% FLUSH
3.0000 mL | Freq: Two times a day (BID) | INTRAVENOUS | Status: DC
  Administered 2017-12-26 – 2017-12-28 (×5): 3 mL via INTRAVENOUS

## 2017-12-26 MED ORDER — OXYCODONE-ACETAMINOPHEN 10-325 MG PO TABS: 1.0000 | ORAL_TABLET | ORAL | Status: DC | PRN

## 2017-12-26 NOTE — H&P (Signed)
He was admitted with CHF not responsive to outpatient rx. Full note to follow. He also has COPD.

## 2017-12-26 NOTE — Progress Notes (Signed)
**Note De-Identified Ellianne Gowen Obfuscation** EKG complete; RN notified of abnormal results and placed in patient chart

## 2017-12-27 ENCOUNTER — Encounter (HOSPITAL_COMMUNITY): Payer: Self-pay | Admitting: Student

## 2017-12-27 ENCOUNTER — Inpatient Hospital Stay (HOSPITAL_COMMUNITY): Payer: Medicare Other

## 2017-12-27 DIAGNOSIS — I480 Paroxysmal atrial fibrillation: Secondary | ICD-10-CM

## 2017-12-27 DIAGNOSIS — I25118 Atherosclerotic heart disease of native coronary artery with other forms of angina pectoris: Secondary | ICD-10-CM

## 2017-12-27 DIAGNOSIS — Z9114 Patient's other noncompliance with medication regimen: Secondary | ICD-10-CM

## 2017-12-27 DIAGNOSIS — I340 Nonrheumatic mitral (valve) insufficiency: Secondary | ICD-10-CM

## 2017-12-27 DIAGNOSIS — E785 Hyperlipidemia, unspecified: Secondary | ICD-10-CM

## 2017-12-27 DIAGNOSIS — G4733 Obstructive sleep apnea (adult) (pediatric): Secondary | ICD-10-CM

## 2017-12-27 DIAGNOSIS — I5043 Acute on chronic combined systolic (congestive) and diastolic (congestive) heart failure: Secondary | ICD-10-CM

## 2017-12-27 DIAGNOSIS — I509 Heart failure, unspecified: Secondary | ICD-10-CM

## 2017-12-27 DIAGNOSIS — R911 Solitary pulmonary nodule: Secondary | ICD-10-CM

## 2017-12-27 DIAGNOSIS — Z9989 Dependence on other enabling machines and devices: Secondary | ICD-10-CM

## 2017-12-27 LAB — BASIC METABOLIC PANEL
ANION GAP: 12 (ref 5–15)
BUN: 18 mg/dL (ref 6–20)
CO2: 26 mmol/L (ref 22–32)
Calcium: 8.9 mg/dL (ref 8.9–10.3)
Chloride: 101 mmol/L (ref 101–111)
Creatinine, Ser: 1.09 mg/dL (ref 0.61–1.24)
Glucose, Bld: 115 mg/dL — ABNORMAL HIGH (ref 65–99)
POTASSIUM: 4.1 mmol/L (ref 3.5–5.1)
Sodium: 139 mmol/L (ref 135–145)

## 2017-12-27 LAB — ECHOCARDIOGRAM COMPLETE
HEIGHTINCHES: 71 in
Weight: 5291.04 oz

## 2017-12-27 MED ORDER — DICLOFENAC SODIUM 1 % TD GEL
2.0000 g | Freq: Four times a day (QID) | TRANSDERMAL | Status: DC | PRN
  Administered 2017-12-27 – 2017-12-29 (×3): 2 g via TOPICAL
  Filled 2017-12-27: qty 100

## 2017-12-27 MED ORDER — IOPAMIDOL (ISOVUE-300) INJECTION 61%
75.0000 mL | Freq: Once | INTRAVENOUS | Status: AC | PRN
  Administered 2017-12-27: 75 mL via INTRAVENOUS

## 2017-12-27 NOTE — Progress Notes (Signed)
*  PRELIMINARY RESULTS* Echocardiogram 2D Echocardiogram has been performed.  [name] [name] 12/27/2017, 11:12 AM

## 2017-12-27 NOTE — Progress Notes (Signed)
Subjective: He says he feels better.  He is not having as much shortness of breath.  He is down about 4 L.  His BNP was 1800.  He complains of some cramping in his legs.  His chest x-ray showed a pulmonary nodule and will need further evaluation  Objective: Vital signs in last 24 hours: Temp:  [97.8 F (36.6 C)-98.6 F (37 C)] 97.8 F (36.6 C) (04/30 0530) Pulse Rate:  [78-102] 83 (04/30 0530) Resp:  [19-20] 19 (04/29 2249) BP: (115-145)/(74-95) 115/74 (04/30 0530) SpO2:  [78 %-100 %] 100 % (04/30 0530) Weight:  [147.6 kg (325 lb 8 oz)-150 kg (330 lb 11 oz)] 150 kg (330 lb 11 oz) (04/30 0500) Weight change:  Last BM Date: 12/26/17  Intake/Output from previous day: 04/29 0701 - 04/30 0700 In: 3 [P.O.:490] Out: 4500 [Urine:4500]  PHYSICAL EXAM General appearance: alert, cooperative, mild distress and morbidly obese Resp: rales bilaterally Cardio: irregularly irregular rhythm GI: soft, non-tender; bowel sounds normal; no masses,  no organomegaly Extremities: extremities normal, atraumatic, no cyanosis or edema  Lab Results:  Results for orders placed or performed during the hospital encounter of 12/26/17 (from the past 48 hour(s))  Comprehensive metabolic panel     Status: Abnormal   Collection Time: 12/26/17  6:15 PM  Result Value Ref Range   Sodium 142 135 - 145 mmol/L   Potassium 4.9 3.5 - 5.1 mmol/L   Chloride 106 101 - 111 mmol/L   CO2 23 22 - 32 mmol/L   Glucose, Bld 111 (H) 65 - 99 mg/dL   BUN 18 6 - 20 mg/dL   Creatinine, Ser 1.03 0.61 - 1.24 mg/dL   Calcium 9.5 8.9 - 10.3 mg/dL   Total Protein 7.9 6.5 - 8.1 g/dL   Albumin 4.4 3.5 - 5.0 g/dL   AST 23 15 - 41 U/L   ALT 21 17 - 63 U/L   Alkaline Phosphatase 141 (H) 38 - 126 U/L   Total Bilirubin 2.5 (H) 0.3 - 1.2 mg/dL   GFR calc non Af Amer >60 >60 mL/min   GFR calc Af Amer >60 >60 mL/min    Comment: (NOTE) The eGFR has been calculated using the CKD EPI equation. This calculation has not been validated in  all clinical situations. eGFR's persistently <60 mL/min signify possible Chronic Kidney Disease.    Anion gap 13 5 - 15    Comment: Performed at [facility], [address], [place], [place] [address]  Magnesium     Status: None   Collection Time: 12/26/17  6:15 PM  Result Value Ref Range   Magnesium 2.0 1.7 - 2.4 mg/dL    Comment: Performed at [facility], [address], [place], [place] [address]  CBC WITH DIFFERENTIAL     Status: Abnormal   Collection Time: 12/26/17  6:15 PM  Result Value Ref Range   WBC 4.8 4.0 - 10.5 K/uL   RBC 4.32 4.22 - 5.81 MIL/uL   Hemoglobin 13.2 13.0 - 17.0 g/dL   HCT 42.6 39.0 - 52.0 %   MCV 98.6 78.0 - 100.0 fL   MCH 30.6 26.0 - 34.0 pg   MCHC 31.0 30.0 - 36.0 g/dL   RDW 18.7 (H) 11.5 - 15.5 %   Platelets 180 150 - 400 K/uL   Neutrophils Relative % 75 %   Neutro Abs 3.6 1.7 - 7.7 K/uL   Lymphocytes Relative 13 %   Lymphs Abs 0.6 (L) 0.7 - 4.0 K/uL   Monocytes Relative  11 %   Monocytes Absolute 0.5 0.1 - 1.0 K/uL   Eosinophils Relative 1 %   Eosinophils Absolute 0.1 0.0 - 0.7 K/uL   Basophils Relative 0 %   Basophils Absolute 0.0 0.0 - 0.1 K/uL    Comment: Performed at [facility], [address] [place], [place] [address]  TSH     Status: None   Collection Time: 12/26/17  6:15 PM  Result Value Ref Range   TSH 1.378 0.350 - 4.500 uIU/mL    Comment: Performed by a 3rd Generation assay with a functional sensitivity of <=0.01 uIU/mL. Performed at [facility], [address], [place], [place] [address]   Brain natriuretic peptide     Status: Abnormal   Collection Time: 12/26/17  6:15 PM  Result Value Ref Range   B Natriuretic Peptide 1,808.0 (H) 0.0 - 100.0 pg/mL    Comment: Performed at [facility], [address] [place], [place] [address]  Basic metabolic panel     Status: Abnormal   Collection Time: 12/27/17  6:10 AM  Result Value Ref Range   Sodium 139 135 - 145 mmol/L   Potassium 4.1 3.5 - 5.1 mmol/L   Chloride 101 101 - 111 mmol/L    CO2 26 22 - 32 mmol/L   Glucose, Bld 115 (H) 65 - 99 mg/dL   BUN 18 6 - 20 mg/dL   Creatinine, Ser 1.09 0.61 - 1.24 mg/dL   Calcium 8.9 8.9 - 10.3 mg/dL   GFR calc non Af Amer >60 >60 mL/min   GFR calc Af Amer >60 >60 mL/min    Comment: (NOTE) The eGFR has been calculated using the CKD EPI equation. This calculation has not been validated in all clinical situations. eGFR's persistently <60 mL/min signify possible Chronic Kidney Disease.    Anion gap 12 5 - 15    Comment: Performed at [facility], [address], [place], [place] [address]    ABGS No results for input(s): PHART, PO2ART, TCO2, HCO3 in the last 72 hours.  Invalid input(s): PCO2 CULTURES No results found for this or any previous visit (from the past 240 hour(s)). Studies/Results: X-ray Chest Pa And Lateral  Result Date: 12/26/2017 CLINICAL DATA:  CHF history of hypertension EXAM: CHEST - 2 VIEW COMPARISON:  05/13/2017, CT chest 05/16/2017, PET-CT 05/13/2014 FINDINGS: Right-sided central venous port tip overlies the proximal right atrium. Cardiomegaly with vascular congestion and diffuse interstitial opacity suspicious for pulmonary edema. Small pleural effusions. No pneumothorax. Suspected 2.5 cm left apical lung nodule. IMPRESSION: 1. Cardiomegaly with vascular congestion and mild to moderate pulmonary edema. Small pleural effusion 2. Suspected 2.5 cm left apical lung nodule. Recommend CT chest to further evaluate. Electronically Signed   By: Donavan Foil M.D.   On: 12/26/2017 20:16    Medications:  Prior to Admission:  Medications Prior to Admission  Medication Sig Dispense Refill Last Dose  . acetaminophen (TYLENOL) 500 MG tablet Take 1,000 mg by mouth every 6 (six) hours as needed for headache.   Taking  . aspirin 81 MG chewable tablet Chew 1 tablet (81 mg total) by mouth daily.   Taking  . atorvastatin (LIPITOR) 40 MG tablet Take 1 tablet (40 mg total) by mouth daily at 6 PM. 90 tablet 3 Taking  . celecoxib  (CELEBREX) 200 MG capsule Take 1 capsule (200 mg total) by mouth 2 (two) times daily as needed for moderate pain.   Taking  . clonazePAM (KLONOPIN) 1 MG tablet Take 2 tablets (2 mg  total) by mouth at bedtime. 60 tablet 2 Taking  . diclofenac sodium (VOLTAREN) 1 % GEL Apply 4 g topically 4 (four) times daily as needed (for leg soreness). 1 Tube 2 Taking  . furosemide (LASIX) 40 MG tablet Take 1 tablet (40 mg total) by mouth 2 (two) times daily. 60 tablet 5 Taking  . Multiple Vitamin (MULTIVITAMIN WITH MINERALS) TABS tablet Take 1 tablet by mouth daily.   Taking  . nitroGLYCERIN (NITROSTAT) 0.4 MG SL tablet Place 1 tablet (0.4 mg total) under the tongue every 5 (five) minutes as needed for chest pain (Max 3 doses within 15 minutes. Call 911 at 3rd dose).  12 Taking  . oxyCODONE-acetaminophen (PERCOCET) 10-325 MG tablet Take 1 tablet by mouth every 4 (four) hours as needed for pain.    Taking  . polyethylene glycol (MIRALAX / GLYCOLAX) packet Take 17 g by mouth daily.   Taking  . rivaroxaban (XARELTO) 20 MG TABS tablet Take 1 tablet (20 mg total) by mouth daily with supper. 30 tablet 3 Taking  . rOPINIRole (REQUIP) 1 MG tablet TAKE 2 TABLETS BY MOUTH AT BEDTIME (Patient taking differently: TAKE 2 MG BY MOUTH AT BEDTIME) 60 tablet 0 Taking  . ROZEREM 8 MG tablet Take 8 mg by mouth at bedtime as needed for sleep.   5 Taking  . tamsulosin (FLOMAX) 0.4 MG CAPS capsule Take 2 capsules (0.8 mg total) by mouth daily. 60 capsule 5 Taking  . tiZANidine (ZANAFLEX) 4 MG tablet Take 4 mg by mouth 4 (four) times daily as needed for muscle spasms.   12 Taking  . tobramycin-dexamethasone (TOBRADEX) ophthalmic solution Place 2 drops into both eyes 4 (four) times daily as needed. (eye burning)  2 Taking  . triamcinolone ointment (KENALOG) 0.1 % APPLY TO AFFECTED AREA TWICE A DAY 30 g 0 Taking   Scheduled: . aspirin  81 mg Oral Daily  . atorvastatin  40 mg Oral q1800  . clonazePAM  2 mg Oral QHS  . furosemide  80 mg  Intravenous Q12H  . polyethylene glycol  17 g Oral Daily  . rivaroxaban  20 mg Oral Q supper  . rOPINIRole  2 mg Oral QHS  . sodium chloride flush  3 mL Intravenous Q12H  . tamsulosin  0.8 mg Oral Daily  . triamcinolone ointment   Topical BID   Continuous:  OVZ:CHYIFOYDXAJOI **OR** acetaminophen, albuterol, nitroGLYCERIN, ondansetron **OR** ondansetron (ZOFRAN) IV, oxyCODONE-acetaminophen **AND** oxyCODONE, tiZANidine  Assesment: He was admitted with exacerbation of CHF.  He is now on 4 L.  He feels better but not back to baseline.  He has coronary disease but no chest pain  He has COPD which contributes to his shortness of breath and he has a pulmonary nodule that needs further evaluation.  He has stopped smoking.  He has history of mantle cell lymphoma in remission  He has morbid obesity unchanged  He has chronic back pain on pain medication  He has severe difficult to treat insomnia  He has atrial fib and he is chronically anticoagulated Active Problems:   CHF (congestive heart failure) ([place])    Plan: Continue IV diuresis.  Echocardiogram today.  Cardiology consultation has been requested    LOS: 1 day   Zaeda Mcferran L 12/27/2017, 8:50 AM

## 2017-12-27 NOTE — Consult Note (Addendum)
Cardiology Consult    Patient ID: PAT SIRES; [id number]; [birth year]   Admit date: 12/26/2017 Date of Consult: 12/27/2017  Primary Care Provider: No primary care provider on file. Primary Cardiologist: Pixie Casino, MD    Patient Profile    [name] [name] is a 77 y.o. male with past medical history of CAD (s/p STEMI in 09/2014 with DES to LAD), chronic combined systolic and diastolic CHF, ischemic cardiomyopathy, PAF (on Eliquis for anticoagulation), lymphedema, HTN, HLD, OSA (on CPAP), COPD and mitral regurgitation who is being seen today for the evaluation of CHF at the request of [name].   History of Present Illness    Mr. Rhett was recently evaluated by Jory Sims, DNP in 09/2017 and reported compliance with his diuretic dosing at that time, having lost over 15 lbs since his previous visit with Dr. Debara Pickett on 10/10/2017. Weight was 301 lbs at the time of his visit and he was continued on Lasix 40mg  BID at the time of his visit.   He called the office on 12/05/2017 reporting worsening shortness of breath but had only been taking Lasix 3-4 times per week due to frequent urination. The importance of compliance with Lasix dosing was further stressed and follow-up was arranged.  In the interim, he was evaluated by his PCP on 12/26/2017 and reported worsening shortness of breath and lower extremity edema over the past several months. Reported having frequent episodes of incontinence which was further exacerbated by Lasix dosing. Due to his progressive symptoms, he was directly admitted for further management. Ibn talking with the patient, he reports worsening dyspnea, orthopnea, and lower extremity edema for the past 5-6 weeks. Denies any chest pain or palpitations. Tells me he has NOT taken Lasix in over 4 weeks due to frequent urination and incontinence.   Initial labs showed WBC 4.8, Hgb 13.2, platelets 180, Na+ 142, K+ 4.9, and creatinine 1.03.  Mg 2.0.  TSH 1.378.  BNP  elevated to 1808.  CXR shows cardiomegaly with vascular congestion and mild to moderate pulmonary edema. A 2.5 cm left apical lung nodule was noted with CT chest recommended for further evaluation. CT imaging is pending at this time.  Initial EKG shows atrial fibrillation with RVR, heart rate 114.  He has been started on IV Lasix 80 mg twice daily and is overall -6.7L since admission (-4.0L yesterday). Creatinine remains stable at 1.09 and K+ at 4.1.   Past Medical History:  Diagnosis Date  . Anxiety    situational  . Bilateral lower extremity edema   . CAD (coronary artery disease) 10/23/14   s/p LAD DES / STEMI with CGS  . Cancer (HCC)    Mantle cell lymphoma  . Dyspnea    with exertion  . ED (erectile dysfunction)   . Hyperlipidemia   . Hypertension   . Insomnia   . Ischemic cardiomyopathy 10/24/14   EF 40-45% by echo  . Obesity    morbid  . OSA (obstructive sleep apnea)    severe, on CPAP 12  . Osteoarthritis   . Peripheral edema   . Persistent atrial fibrillation ([place]) 05/16/2017  . Restless leg syndrome   . Rhinitis   . Tobacco abuse     Past Surgical History:  Procedure Laterality Date  . ANTERIOR LAT LUMBAR FUSION N/A 07/02/2016   Procedure: LUMBAR THREE - FOUR ANTERIOR LATERAL LUMBAR FUSION WITH PERCUTANEOUS SCREWS LEFT;  Surgeon: Earnie Larsson, MD;  Location: [place];  Service: Neurosurgery;  Laterality:  N/A;  . BACK SURGERY  10/2009  . CORONARY ANGIOPLASTY    . EYE SURGERY     lazer bil  . HEMORRHOID SURGERY    . INTRA-AORTIC BALLOON PUMP INSERTION  10/23/2014   Procedure: INTRA-AORTIC BALLOON PUMP INSERTION;  Surgeon: Peter M Martinique, MD;  Location: [facility] CATH LAB;  Service: Cardiovascular;;  . LEFT HEART CATHETERIZATION WITH CORONARY ANGIOGRAM N/A 10/23/2014   Procedure: LEFT HEART CATHETERIZATION WITH CORONARY ANGIOGRAM;  Surgeon: Peter M Martinique, MD;  Location: [facility] CATH LAB;  Service: Cardiovascular;  Laterality: N/A;  . LYMPH NODE BIOPSY Right 01/2014   groin area  .  PERCUTANEOUS CORONARY STENT INTERVENTION (PCI-S)  10/23/2014   Procedure: PERCUTANEOUS CORONARY STENT INTERVENTION (PCI-S);  Surgeon: Peter M Martinique, MD;  Location: [facility] CATH LAB;  Service: Cardiovascular;;  . PORTACATH PLACEMENT Right 2015  . ROTATOR CUFF REPAIR  2011  . TOTAL KNEE ARTHROPLASTY     left  . TOTAL KNEE ARTHROPLASTY  08/04/2012   right knee  . TOTAL KNEE ARTHROPLASTY  08/04/2012   Procedure: TOTAL KNEE ARTHROPLASTY;  Surgeon: Yvette Rack., MD;  Location: [place];  Service: Orthopedics;  Laterality: Right;  WITH PATELLA RESURFACING     Home Medications:  Prior to Admission medications   Medication Sig Start Date End Date Taking? Authorizing Provider  acetaminophen (TYLENOL) 500 MG tablet Take 1,000 mg by mouth every 6 (six) hours as needed for headache.    [provider]  aspirin 81 MG chewable tablet Chew 1 tablet (81 mg total) by mouth daily. 10/26/14   Erlene Quan, PA-C  atorvastatin (LIPITOR) 40 MG tablet Take 1 tablet (40 mg total) by mouth daily at 6 PM. 05/18/17   Kilroy, Doreene Burke, PA-C  celecoxib (CELEBREX) 200 MG capsule Take 1 capsule (200 mg total) by mouth 2 (two) times daily as needed for moderate pain. 05/18/17   Erlene Quan, PA-C  clonazePAM (KLONOPIN) 1 MG tablet Take 2 tablets (2 mg total) by mouth at bedtime. 12/16/14   Baird Cancer, PA-C  diclofenac sodium (VOLTAREN) 1 % GEL Apply 4 g topically 4 (four) times daily as needed (for leg soreness). 10/26/14   Erlene Quan, PA-C  furosemide (LASIX) 40 MG tablet Take 1 tablet (40 mg total) by mouth 2 (two) times daily. 10/10/17   Hilty, Nadean Corwin, MD  Multiple Vitamin (MULTIVITAMIN WITH MINERALS) TABS tablet Take 1 tablet by mouth daily. 05/19/17   Erlene Quan, PA-C  nitroGLYCERIN (NITROSTAT) 0.4 MG SL tablet Place 1 tablet (0.4 mg total) under the tongue every 5 (five) minutes as needed for chest pain (Max 3 doses within 15 minutes. Call 911 at 3rd dose). 05/18/17   Erlene Quan, PA-C    oxyCODONE-acetaminophen (PERCOCET) 10-325 MG tablet Take 1 tablet by mouth every 4 (four) hours as needed for pain.  04/28/17   [provider]  polyethylene glycol (MIRALAX / GLYCOLAX) packet Take 17 g by mouth daily.    [provider]  rivaroxaban (XARELTO) 20 MG TABS tablet Take 1 tablet (20 mg total) by mouth daily with supper. 08/10/17   Hilty, Nadean Corwin, MD  rOPINIRole (REQUIP) 1 MG tablet TAKE 2 TABLETS BY MOUTH AT BEDTIME Patient taking differently: TAKE 2 MG BY MOUTH AT BEDTIME 05/24/14   Chesley Mires, MD  ROZEREM 8 MG tablet Take 8 mg by mouth at bedtime as needed for sleep.  10/27/14   [provider]  tamsulosin (FLOMAX) 0.4 MG CAPS capsule Take  2 capsules (0.8 mg total) by mouth daily. 10/10/17   Hilty, Nadean Corwin, MD  tiZANidine (ZANAFLEX) 4 MG tablet Take 4 mg by mouth 4 (four) times daily as needed for muscle spasms.  05/25/16   [provider]  tobramycin-dexamethasone Baird Cancer) ophthalmic solution Place 2 drops into both eyes 4 (four) times daily as needed. (eye burning) 04/22/16   [provider]  triamcinolone ointment (KENALOG) 0.1 % APPLY TO AFFECTED AREA TWICE A DAY 08/31/17   Hilty, Nadean Corwin, MD    Inpatient Medications: Scheduled Meds: . aspirin  81 mg Oral Daily  . atorvastatin  40 mg Oral q1800  . clonazePAM  2 mg Oral QHS  . furosemide  80 mg Intravenous Q12H  . polyethylene glycol  17 g Oral Daily  . rivaroxaban  20 mg Oral Q supper  . rOPINIRole  2 mg Oral QHS  . sodium chloride flush  3 mL Intravenous Q12H  . tamsulosin  0.8 mg Oral Daily  . triamcinolone ointment   Topical BID   Continuous Infusions:  PRN Meds: acetaminophen **OR** acetaminophen, albuterol, nitroGLYCERIN, ondansetron **OR** ondansetron (ZOFRAN) IV, oxyCODONE-acetaminophen **AND** oxyCODONE, tiZANidine  Allergies:   No Known Allergies  Social History:   Social History   Socioeconomic History  . Marital status: Widowed    Spouse name: Not on  file  . Number of children: 2  . Years of education: Not on file  . Highest education level: Not on file  Occupational History  . Occupation: Disabled, Retired Dealer  . Occupation: RETIRED    Employer: RETIRED  Social Needs  . Financial resource strain: Not on file  . Food insecurity:    Worry: Not on file    Inability: Not on file  . Transportation needs:    Medical: Not on file    Non-medical: Not on file  Tobacco Use  . Smoking status: Current Every Day Smoker    Packs/day: 0.10    Years: 55.00    Pack years: 5.50    Types: Cigarettes  . Smokeless tobacco: Never Used  . Tobacco comment: <1 ppd  Substance and Sexual Activity  . Alcohol use: No  . Drug use: No  . Sexual activity: Not on file  Lifestyle  . Physical activity:    Days per week: Not on file    Minutes per session: Not on file  . Stress: Not on file  Relationships  . Social connections:    Talks on phone: Not on file    Gets together: Not on file    Attends religious service: Not on file    Active member of club or organization: Not on file    Attends meetings of clubs or organizations: Not on file    Relationship status: Not on file  . Intimate partner violence:    Fear of current or ex partner: Not on file    Emotionally abused: Not on file    Physically abused: Not on file    Forced sexual activity: Not on file  Other Topics Concern  . Not on file  Social History Narrative  . Not on file     Family History:    Family History  Problem Relation Age of Onset  . Unexplained death Mother 69       Considered natural causes, no autopsy  . Early death Father        Killed In World War II  . Coronary artery disease Neg Hx  Review of Systems    General:  No chills, fever, night sweats or weight changes.  Cardiovascular:  No chest pain, palpitations, paroxysmal nocturnal dyspnea. Positive for dyspnea on exertion, orthopnea, and edema.  Dermatological: No rash,  lesions/masses Respiratory: No cough, dyspnea Urologic: No hematuria. Positive for frequent urination and incontinence.  Abdominal:   No nausea, vomiting, diarrhea, bright red blood per rectum, melena, or hematemesis Neurologic:  No visual changes, wkns, changes in mental status. All other systems reviewed and are otherwise negative except as noted above.  Physical Exam/Data    Vitals:   12/26/17 2052 12/26/17 2249 12/27/17 0500 12/27/17 0530  BP: (!) 127/95   115/74  Pulse: (!) 102   83  Resp:  19    Temp: 98.4 F (36.9 C)   97.8 F (36.6 C)  TempSrc: Oral   Oral  SpO2: 94% 94%  100%  Weight:   (!) 330 lb 11 oz (150 kg)   Height:        Intake/Output Summary (Last 24 hours) at 12/27/2017 0935 Last data filed at 12/27/2017 4098 Gross per 24 hour  Intake 490 ml  Output 6800 ml  Net -6310 ml   Filed Weights   12/26/17 1739 12/27/17 0500  Weight: (!) 325 lb 8 oz (147.6 kg) (!) 330 lb 11 oz (150 kg)   Body mass index is 46.12 kg/m.   General: Pleasant, obese Caucasian male appearing in NAD Psych: Normal affect. Neuro: Alert and oriented X 3. Moves all extremities spontaneously. HEENT: Normal  Neck: Supple without bruits. JVD at 10 cm. Lungs:  Resp regular and unlabored, rales along bases bilaterally. Heart: Irregularly irregular, no s3, s4, 2/6 holosystolic murmur along Apex.  Abdomen: Soft, non-tender, non-distended, BS + x 4.  Extremities: No clubbing, cyanosis or edema. DP/PT/Radials 2+ and equal bilaterally.   EKG:  The EKG was personally reviewed and demonstrates: Atrial fibrillation with RVR, heart rate 114.  Telemetry:  Telemetry was personally reviewed and demonstrates: Atrial fibrillation, HR in 80's to low-100's.    Labs/Studies     Relevant CV Studies:  Cardiac Catheterization: 09/2014 Coronary angiography: Coronary dominance: left  Left mainstem: Normal  Left anterior descending (LAD): The LAD is occluded 100% in the proximal vessel. The  proximal to mid vessel is calcified.  Left circumflex (LCx): Large dominant vessel. It gives off a large bifurcating OM branch before terminating in the PL and PDA branches. The OM has a 60% stenosis prior to the bifurcation into 2 large OM branches. The more lateral branch has a 90% stenosis in the mid vessel.   Right coronary artery (RCA): The RCA is a nondominant vessel. There is a long segment of 80% stenosis in the proximal to mid vessel with a focal 80-90% stenosis in the mid vessel.  Left ventriculography: Not done due to elevated EDP.  PCI Note:  Following the diagnostic procedure, the decision was made to proceed with PCI of the LAD.  Weight-based bivalirudin was given for anticoagulation. The patient was initiated on IV Cangrelor. Once reperfusion was obtained he was loaded with po Brilinta. Once a therapeutic ACT was achieved, a 6 [place] XBLAD 3.5  guide catheter was inserted.  A prowater coronary guidewire was used to cross the lesion.  The lesion was predilated with a 2.5 mm balloon.  The lesion was then stented with a 3.5 x 32 mm Synergy  stent.  The stent was postdilated with a 3.75 mm noncompliant balloon.  Following PCI, there was 0% residual  stenosis and TIMI-3 flow. Final angiography confirmed an excellent result.  During the procedure the patient demonstrated persistent cardiogenic shock with hypotension and elevated LVEDP. He was placed on  IV levophed. The right groin was prepped and draped in a sterile fashion. The right femoral artery was accessed using a modified Seldinger technique. An IABP was placed and positioned distal the there left subclavian artery. With IABP and IV levophed his BP did improve. he did have a lot of ventricular ectopy with reperfusion but this improved by the end of procedure.  The patient tolerated the procedure well. There were no immediate procedural complications. A TR band was used for radial hemostasis. The patient was transferred to the post  catheterization recovery area for further monitoring.  PCI Data: Vessel - LAD/Segment - proximal Percent Stenosis (pre)  100% TIMI-flow 0 Stent 3.5 x 32 mm Synergy stent Percent Stenosis (post) 0% TIMI-flow (post) 3  Final Conclusions:   1. Severe 3 vessel obstructive CAD. Culprit lesion is proximal LAD occlusion 2. Cardiogenic shock 3. Successful stenting of the proximal LAD with a DES.  4. IABP placement.    Recommendations:  DAPT for at least one year. Will resume IV heparin approximately 2 hours after radial band removed if no bleeding. Continue hemodynamic support with IABP and IV pressors. Check Echo for LV function.  Echocardiogram: 04/2017 Study Conclusions  - Left ventricle: The cavity size was normal. Wall thickness was   normal. Systolic function was mildly to moderately reduced. The   estimated ejection fraction was in the range of 40% to 45%.   Hypokinesis of the mid-apicalanteroseptal, anterior, and   anterolateral myocardium; consistent with ischemia in the   distribution of the left anterior descending coronary artery. No   evidence of thrombus. - Mitral valve: There was at least moderate to severe   regurgitation. Valve area by continuity equation (using LVOT   flow): 1.64 cm^2. - Left atrium: The atrium was moderately to severely dilated. - Right ventricle: The cavity size was moderately dilated. Wall   thickness was normal. Systolic function was mildly reduced. - Right atrium: The atrium was moderately to severely dilated.  Impressions:  - Images from March 30, 2017 study were also reviewed. There is no   change in LV function or wall motion. Pulmonary vein flow was not   recorded on the current study, but the previous study is   suspicious from systolic flow reversal in the pulmonary veins,   Suspect there is severe mitral insufficiency. TEE may be   indicated, especially if the patient is a candidate for mitral   valve repair. Compared to March 30, 2017, RV function is slightly   worse, both by visual estimation and by objective measurements   (TAPSE and RV annular DTI systolic velocity).  Laboratory Data:  Chemistry Recent Labs  Lab 12/26/17 1815 12/27/17 0610  NA 142 139  K 4.9 4.1  CL 106 101  CO2 23 26  GLUCOSE 111* 115*  BUN 18 18  CREATININE 1.03 1.09  CALCIUM 9.5 8.9  GFRNONAA >60 >60  GFRAA >60 >60  ANIONGAP 13 12    Recent Labs  Lab 12/26/17 1815  PROT 7.9  ALBUMIN 4.4  AST 23  ALT 21  ALKPHOS 141*  BILITOT 2.5*   Hematology Recent Labs  Lab 12/26/17 1815  WBC 4.8  RBC 4.32  HGB 13.2  HCT 42.6  MCV 98.6  MCH 30.6  MCHC 31.0  RDW 18.7*  PLT 180  Cardiac EnzymesNo results for input(s): TROPONINI in the last 168 hours. No results for input(s): TROPIPOC in the last 168 hours.  BNP Recent Labs  Lab 12/26/17 1815  BNP 1,808.0*    DDimer No results for input(s): DDIMER in the last 168 hours.  Radiology/Studies:  X-ray Chest Pa And Lateral  Result Date: 12/26/2017 CLINICAL DATA:  CHF history of hypertension EXAM: CHEST - 2 VIEW COMPARISON:  05/13/2017, CT chest 05/16/2017, PET-CT 05/13/2014 FINDINGS: Right-sided central venous port tip overlies the proximal right atrium. Cardiomegaly with vascular congestion and diffuse interstitial opacity suspicious for pulmonary edema. Small pleural effusions. No pneumothorax. Suspected 2.5 cm left apical lung nodule. IMPRESSION: 1. Cardiomegaly with vascular congestion and mild to moderate pulmonary edema. Small pleural effusion 2. Suspected 2.5 cm left apical lung nodule. Recommend CT chest to further evaluate. Electronically Signed   By: Donavan Foil M.D.   On: 12/26/2017 20:16     Assessment & Plan    1. Acute on Chronic Combined Systolic and Diastolic CHF - the patient has a known reduced EF of 40-45% by echo in 04/2017.  Was noted to be significantly volume overloaded at the time of his office visit in 09/2017 and volume status improved with  compliance with diuretic dosing. Weight was 301 lbs at that time, now elevated to 330 lbs. He has not taken Lasix in over 4 weeks due to frequent urination.   - BNP elevated to 1808. CXR shows cardiomegaly with vascular congestion and mild to moderate pulmonary edema. He has been started on IV Lasix 80 mg twice daily and is overall -6.7L since admission (-4.0L yesterday). Creatinine remains stable at 1.09 and K+ at 4.1. - continue with Lasix at current dosing as he is responding well. The major concern is if he will be compliant with diuretic dosing in the outpatient setting as he tells me he "wants to be fixed and no longer take diuretics". We reviewed that in the setting of his cardiomyopathy, Lasix will be necessary at this time. May need to consider once daily dosing at the time of discharge for improved compliance.   2. CAD - s/p STEMI in 09/2014 with cardiac catheterization as outlined above. Underwent DES placement to LAD.  - denies any recent chest pain. Has experienced dyspnea on exertion in the setting of volume overload. EKG shows no acute ischemic changes.  - remains on ASA and statin therapy. Unclear why he is not on BB therapy (possibly secondary to COPD but could could consider the addition of a cardioselective BB once CHF improves). Would defer to Primary Cardiologist regarding continuing ASA as he is also on Xarelto for anticoagulation for atrial fibrillation.   3. Paroxysmal Atrial Fibrillation - He denies any evidence of active bleeding.  Remains on Xarelto 20 mg daily for anticoagulation. - HR variable in the 80's to 110's. Consider addition of BB therapy once volume status improves.   4. Mitral Regurgitation - Moderate to severe by echocardiogram in 04/2017. Repeat echocardiogram is pending for further assessment.  5. HTN - BP has been well-controlled at 115/74 - 145/95 since admission. At 115/74 this AM.  - continue to follow.   6. HLD - FLP in 04/2017 showed total  cholesterol of 127, HDL 27, and LDL 92. Not at goal of LDL < 70. - currently on Atorvastatin 40mg  daily. Would consider further titration to 80mg  daily as an outpatient if still not at goal.   7. OSA - compliance with CPAP encouraged.   8. Lung  Nodule - noted on CXR upon admission. Chest CT is pending.    For questions or updates, please contact [place] Please consult [email or web address] for contact info under Cardiology/STEMI.  Signed, Erma Heritage, PA-C 12/27/2017, 9:35 AM Pager: [phone]  The patient was seen and examined, and I agree with the history, physical exam, assessment and plan as documented above, with modifications as noted below. I have also personally reviewed all relevant documentation, old records, labs, and both radiographic and cardiovascular studies. I have also independently interpreted old and new ECG's.  Briefly, this is a 77 year old male with a history of coronary artery disease and prior STEMI in February 2016 with drug-eluting stent placement to the LAD, chronic combined systolic and diastolic heart failure, ischemic cardiomyopathy, proximal atrial fibrillation, chronic lymphedema, hypertension, obesity, hyperlipidemia, obstructive sleep apnea, COPD, and mitral regurgitation has been hospitalized for decompensated CHF.  He tells me it is very embarrassing when he has urinary incontinence while standing in line at [facility] or when shopping at [facility].  For this reason, he has not taken Lasix in over 4 weeks.  He denies chest pain.  He previously responded well to Lasix 40 mg twice daily in the outpatient setting in early February of this year.  Relevant labs reviewed above with elevated BNP, 1808.  TSH and renal function are normal.  I reviewed the chest x-ray personally which demonstrated cardiomegaly with mild to moderate pulmonary edema.  I also reviewed the chest CT which demonstrated a spiculated 2.7 cm nodule in the medial left upper lobe/apex  concerning for lung cancer.  He is currently on IV Lasix 80 mg twice daily with over 6.7 L output in the last 24 hours.  I agree with consideration for taking outpatient Lasix perhaps once daily for improved compliance.    He takes tamsulosin 0.8 mg nightly and the dose was increased in early February.  I am not certain about his urology follow-up but this may need further investigation for improved patient compliance with Lasix.  He is symptomatically stable with respect to coronary artery disease and will continue on aspirin and statin therapy.  He is not on a beta-blocker but this decision will be deferred to his primary cardiologist.  He is wheezing and the decision to withhold beta-blocker therapy may be due to COPD.  He remains on Xarelto 20 mg daily for systemic anticoagulation for paroxysmal atrial fibrillation.  He had moderate to severe mitral regurgitation by echocardiogram which I reviewed dated September 2018.  Repeat echocardiogram has been performed.  He will likely need a PET scan in the outpatient setting for further work-up of the aforementioned 2.7 cm spiculated lung nodule concerning for cancer.   Kate Sable, MD, [facility]  12/27/2017 11:25 AM

## 2017-12-27 NOTE — Progress Notes (Signed)
Pt placed on [facility] CPAP. CPAP plugged into red outlet with 2L O2 in line

## 2017-12-27 NOTE — H&P (Signed)
[name] [name] MRN: [id number] DOB/AGE: 75-Jun-1942 75 y.o. Primary Care Physician:Hilty, Nadean Corwin, MD Admit date: 12/26/2017 Chief Complaint: Shortness of breath HPI: This is documentation of history and physical performed in my office yesterday 4/ 29/ 2019.  He says he is had about 2 months of increasing shortness of breath and swelling of his legs.  He has been on Lasix but it does not seem to have helped.  He is so short of breath that he frequently urinates on himself before he can get to the bathroom.  He does not have any other new complaints.  He is not having any chest pain.  He has coronary disease with previous STEMI.  He has sleep apnea and he is on CPAP.  He has ischemic cardiomyopathy with 40 to 45% ejection fraction by echo.  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  He has chronic insomnia which is severe and difficult to treat.  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  He has chronic atrial fib and he is on chronic anticoagulation.  He had a long history of tobacco abuse but he stopped in January.  He has COPD at baseline.  He has had orthopnea.  He has had some PND.  He has noticed marked swelling of his legs.  He continues to have back pain.  He is not wheezing.  As mentioned he has not had any chest pain.  No nausea vomiting diarrhea.  No headaches.  He has chronic rhinorrhea.  No urinary symptoms except he is incontinent that he thinks is from his shortness of breath  Past Medical History:  Diagnosis Date  . Anxiety    situational  . Bilateral lower extremity edema   . CAD (coronary artery disease) 10/23/14   s/p LAD DES / STEMI with CGS  . Cancer (HCC)    Mantle cell lymphoma  . Dyspnea    with exertion  . ED (erectile dysfunction)   . Hyperlipidemia   . Hypertension   . Insomnia   . Ischemic cardiomyopathy 10/24/14   EF 40-45% by echo  .  Obesity    morbid  . OSA (obstructive sleep apnea)    severe, on CPAP 12  . Osteoarthritis   . Peripheral edema   . Persistent atrial fibrillation ([place]) 05/16/2017  . Restless leg syndrome   . Rhinitis   . Tobacco abuse    Past Surgical History:  Procedure Laterality Date  . ANTERIOR LAT LUMBAR FUSION N/A 07/02/2016   Procedure: LUMBAR THREE - FOUR ANTERIOR LATERAL LUMBAR FUSION WITH PERCUTANEOUS SCREWS LEFT;  Surgeon: Earnie Larsson, MD;  Location: [place];  Service: Neurosurgery;  Laterality: N/A;  . BACK SURGERY  10/2009  . CORONARY ANGIOPLASTY    . EYE SURGERY     lazer bil  . HEMORRHOID SURGERY    . INTRA-AORTIC BALLOON PUMP INSERTION  10/23/2014   Procedure: INTRA-AORTIC BALLOON PUMP INSERTION;  Surgeon: Peter M Martinique, MD;  Location: [facility] CATH LAB;  Service: Cardiovascular;;  . LEFT HEART CATHETERIZATION WITH CORONARY ANGIOGRAM N/A 10/23/2014   Procedure: LEFT HEART CATHETERIZATION WITH CORONARY ANGIOGRAM;  Surgeon: Peter M Martinique, MD;  Location: [facility] CATH LAB;  Service: Cardiovascular;  Laterality: N/A;  . LYMPH NODE BIOPSY Right 01/2014   groin area  . PERCUTANEOUS CORONARY STENT INTERVENTION (PCI-S)  10/23/2014   Procedure: PERCUTANEOUS CORONARY STENT INTERVENTION (PCI-S);  Surgeon: Peter M Martinique, MD;  Location: [facility] CATH LAB;  Service: Cardiovascular;;  . PORTACATH PLACEMENT Right 2015  . ROTATOR CUFF REPAIR  2011  . TOTAL KNEE ARTHROPLASTY     left  . TOTAL KNEE ARTHROPLASTY  08/04/2012   right knee  . TOTAL KNEE ARTHROPLASTY  08/04/2012   Procedure: TOTAL KNEE ARTHROPLASTY;  Surgeon: Yvette Rack., MD;  Location: [place];  Service: Orthopedics;  Laterality: Right;  WITH PATELLA RESURFACING        Family History  Problem Relation Age of Onset  . Unexplained death Mother 28       Considered natural causes, no autopsy  . Early death Father        Killed In World War II  . Coronary artery disease Neg Hx     Social History:  reports that he has been smoking cigarettes.  He has  a 5.50 pack-year smoking history. He has never used smokeless tobacco. He reports that he does not drink alcohol or use drugs.  Allergies: No Known Allergies  Medications Prior to Admission  Medication Sig Dispense Refill  . acetaminophen (TYLENOL) 500 MG tablet Take 1,000 mg by mouth every 6 (six) hours as needed for headache.    Marland Kitchen aspirin 81 MG chewable tablet Chew 1 tablet (81 mg total) by mouth daily.    Marland Kitchen atorvastatin (LIPITOR) 40 MG tablet Take 1 tablet (40 mg total) by mouth daily at 6 PM. 90 tablet 3  . celecoxib (CELEBREX) 200 MG capsule Take 1 capsule (200 mg total) by mouth 2 (two) times daily as needed for moderate pain.    . clonazePAM (KLONOPIN) 1 MG tablet Take 2 tablets (2 mg total) by mouth at bedtime. 60 tablet 2  . diclofenac sodium (VOLTAREN) 1 % GEL Apply 4 g topically 4 (four) times daily as needed (for leg soreness). 1 Tube 2  . furosemide (LASIX) 40 MG tablet Take 1 tablet (40 mg total) by mouth 2 (two) times daily. 60 tablet 5  . Multiple Vitamin (MULTIVITAMIN WITH MINERALS) TABS tablet Take 1 tablet by mouth daily.    . nitroGLYCERIN (NITROSTAT) 0.4 MG SL tablet Place 1 tablet (0.4 mg total) under the tongue every 5 (five) minutes as needed for chest pain (Max 3 doses within 15 minutes. Call 911 at 3rd dose).  12  . oxyCODONE-acetaminophen (PERCOCET) 10-325 MG tablet Take 1 tablet by mouth every 4 (four) hours as needed for pain.     . polyethylene glycol (MIRALAX / GLYCOLAX) packet Take 17 g by mouth daily.    . rivaroxaban (XARELTO) 20 MG TABS tablet Take 1 tablet (20 mg total) by mouth daily with supper. 30 tablet 3  . rOPINIRole (REQUIP) 1 MG tablet TAKE 2 TABLETS BY MOUTH AT BEDTIME (Patient taking differently: TAKE 2 MG BY MOUTH AT BEDTIME) 60 tablet 0  . ROZEREM 8 MG tablet Take 8 mg by mouth at bedtime as needed for sleep.   5  . tamsulosin (FLOMAX) 0.4 MG CAPS capsule Take 2 capsules (0.8 mg total) by mouth daily. 60 capsule 5  . tiZANidine (ZANAFLEX) 4 MG  tablet Take 4 mg by mouth 4 (four) times daily as needed for muscle spasms.   12  . tobramycin-dexamethasone (TOBRADEX) ophthalmic solution Place 2 drops into both eyes 4 (four) times daily as needed. (eye burning)  2  . triamcinolone ointment (KENALOG) 0.1 % APPLY TO AFFECTED AREA TWICE A DAY 30 g 0  ZOX:WRUEA from the symptoms mentioned above,there are no other symptoms referable to all systems reviewed.  Physical Exam: Blood pressure 115/74, pulse 83, temperature 97.8 F (36.6 C), temperature source Oral, resp. rate 19, height 5\' 11"  (1.803 m), weight (!) 150 kg (330 lb 11 oz), SpO2 100 %. Constitutional: He is awake and alert and looks short of breath at rest.  He is obese.  Eyes: Pupils react EOMI.  Ears nose mouth and throat: His mucous membranes are moist.  His neck is supple.  He does have JVD sitting upright.  Throat is clear.  Cardiovascular: He has atrial fib and his heart rates about 106.  I do not hear a gallop.  He has brawny edema of both legs.  Respiratory: His respiratory effort is increased.  He has rales bilaterally.  Gastrointestinal: His abdomen is soft with no masses.  Skin: He has marked venous stasis changes on both legs.  Musculoskeletal: Normal strength in the upper and lower extremities bilaterally.  Neurological: No focal abnormalities.  Psychiatric: He is mildly anxious   Recent Labs    12/26/17 1815  WBC 4.8  NEUTROABS 3.6  HGB 13.2  HCT 42.6  MCV 98.6  PLT 180   Recent Labs    12/26/17 1815 12/27/17 0610  NA 142 139  K 4.9 4.1  CL 106 101  CO2 23 26  GLUCOSE 111* 115*  BUN 18 18  CREATININE 1.03 1.09  CALCIUM 9.5 8.9  MG 2.0  --   lablast2(ast:2,ALT:2,alkphos:2,bilitot:2,prot:2,albumin:2)@    No results found for this or any previous visit (from the past 240 hour(s)).   X-ray Chest Pa And Lateral  Result Date: 12/26/2017 CLINICAL DATA:  CHF history of hypertension EXAM: CHEST - 2 VIEW COMPARISON:  05/13/2017, CT chest 05/16/2017,  PET-CT 05/13/2014 FINDINGS: Right-sided central venous port tip overlies the proximal right atrium. Cardiomegaly with vascular congestion and diffuse interstitial opacity suspicious for pulmonary edema. Small pleural effusions. No pneumothorax. Suspected 2.5 cm left apical lung nodule. IMPRESSION: 1. Cardiomegaly with vascular congestion and mild to moderate pulmonary edema. Small pleural effusion 2. Suspected 2.5 cm left apical lung nodule. Recommend CT chest to further evaluate. Electronically Signed   By: Donavan Foil M.D.   On: 12/26/2017 20:16   Impression: He is admitted with acute on chronic congestive heart failure.  He has not been having chest pain but he does have coronary disease.  He will start IV diuresis  He has COPD at baseline which seems fairly stable  He has chronic low back pain which is stable but severe  He has severe insomnia on medication  He has mantle cell lymphoma in remission  He has chronic atrial fib on chronic anticoagulation Active Problems:   CHF (congestive heart failure) ([place])     Plan: Admit for blood work IV diuresis he will have cardiology consultation and repeat echocardiogram      Annais Crafts L   12/27/2017, 8:26 AM

## 2017-12-28 DIAGNOSIS — N401 Enlarged prostate with lower urinary tract symptoms: Secondary | ICD-10-CM

## 2017-12-28 DIAGNOSIS — R3915 Urgency of urination: Secondary | ICD-10-CM

## 2017-12-28 DIAGNOSIS — N3941 Urge incontinence: Secondary | ICD-10-CM

## 2017-12-28 DIAGNOSIS — I872 Venous insufficiency (chronic) (peripheral): Secondary | ICD-10-CM

## 2017-12-28 DIAGNOSIS — I10 Essential (primary) hypertension: Secondary | ICD-10-CM

## 2017-12-28 LAB — BASIC METABOLIC PANEL
Anion gap: 12 (ref 5–15)
BUN: 19 mg/dL (ref 6–20)
CALCIUM: 8.9 mg/dL (ref 8.9–10.3)
CO2: 29 mmol/L (ref 22–32)
Chloride: 95 mmol/L — ABNORMAL LOW (ref 101–111)
Creatinine, Ser: 1.04 mg/dL (ref 0.61–1.24)
GFR calc Af Amer: >60 mL/min (ref >60)
Glucose, Bld: 106 mg/dL — ABNORMAL HIGH (ref 65–99)
Potassium: 3.6 mmol/L (ref 3.5–5.1)
Sodium: 136 mmol/L (ref 135–145)

## 2017-12-28 MED ORDER — POTASSIUM CHLORIDE CRYS ER 20 MEQ PO TBCR
20.0000 meq | EXTENDED_RELEASE_TABLET | Freq: Two times a day (BID) | ORAL | Status: DC
  Administered 2017-12-28 – 2017-12-30 (×5): 20 meq via ORAL
  Filled 2017-12-28 (×5): qty 1

## 2017-12-28 NOTE — Consult Note (Signed)
   [facility] CM Inpatient Consult   12/28/2017  AZARIAN STARACE 07/07/1941 [id number]   Referral received by inpatient case manager for [place] Management services and post hospital discharge follow up related to a diagnosis of HF. Patient was evaluated for community based chronic disease management services with [facility] care Management Program as a benefit of patient's [employer]. Called into patient's room to explain [facility] Care Management services. Patient states he does not weigh himself nor does he easily recognize sodium in foods. Patient admits to medication affordability issues and is interested in mail order pharmacy if possible. Patient also admits to transportation issues related to finances not his ability to drive. Verbal consent recieved. Patient gave [phone] as the best number to reach him. He also gave verbal permission to call his daughter Craigory Toste  At [phone] if he can not be reached. Patient will receive post hospital discharge calls and be evaluated for monthly home visits by [place], [place] [facility] Pharmacist. [facility] Management services does not interfere with or replace any services arranged by the inpatient care management team. Made inpatient RNCM aware  [name] will be following for care management. For additional questions please contact:   Chasty Randal RN, [facility] Liaison  [phone]) Business Mobile [phone]) Toll free office

## 2017-12-28 NOTE — Consult Note (Signed)
Urology Consult  Referring physician: [name] Reason for referral: urinary incontinence  Chief Complaint: urge incontinence  History of Present Illness: [name] [name] is a 77yo with s hx of CAD and BPH admitted with COPD exacerbation. He was having considerable urinary incontinence and at first a condom catheter was placed and this was exchanged for an indwelling foley.  He was on flomax daily prior to admission. He was prescribed flomax BID but the patient refused to take it BID due to the concern for side effects. At baseline he has urinary frequency q 1-2 hours, urinary urgency with urge incontinence, nocturia 2-4x, and a feeling of incomplete emptying. He is doing well with the indwelling foley  Past Medical History:  Diagnosis Date  . Anxiety    situational  . Bilateral lower extremity edema   . CAD (coronary artery disease) 10/23/2014   a. s/p STEMI in 09/2014 with DES to LAD  . Cancer (HCC)    Mantle cell lymphoma  . Dyspnea    with exertion  . ED (erectile dysfunction)   . Hyperlipidemia   . Hypertension   . Insomnia   . Ischemic cardiomyopathy 10/24/14   EF 40-45% by echo  . Obesity    morbid  . OSA (obstructive sleep apnea)    severe, on CPAP 12  . Osteoarthritis   . Peripheral edema   . Persistent atrial fibrillation ([place]) 05/16/2017  . Restless leg syndrome   . Rhinitis   . Tobacco abuse    Past Surgical History:  Procedure Laterality Date  . ANTERIOR LAT LUMBAR FUSION N/A 07/02/2016   Procedure: LUMBAR THREE - FOUR ANTERIOR LATERAL LUMBAR FUSION WITH PERCUTANEOUS SCREWS LEFT;  Surgeon: Earnie Larsson, MD;  Location: [place];  Service: Neurosurgery;  Laterality: N/A;  . BACK SURGERY  10/2009  . CORONARY ANGIOPLASTY    . EYE SURGERY     lazer bil  . HEMORRHOID SURGERY    . INTRA-AORTIC BALLOON PUMP INSERTION  10/23/2014   Procedure: INTRA-AORTIC BALLOON PUMP INSERTION;  Surgeon: Peter M Martinique, MD;  Location: [facility] CATH LAB;  Service: Cardiovascular;;  . LEFT HEART  CATHETERIZATION WITH CORONARY ANGIOGRAM N/A 10/23/2014   Procedure: LEFT HEART CATHETERIZATION WITH CORONARY ANGIOGRAM;  Surgeon: Peter M Martinique, MD;  Location: [facility] CATH LAB;  Service: Cardiovascular;  Laterality: N/A;  . LYMPH NODE BIOPSY Right 01/2014   groin area  . PERCUTANEOUS CORONARY STENT INTERVENTION (PCI-S)  10/23/2014   Procedure: PERCUTANEOUS CORONARY STENT INTERVENTION (PCI-S);  Surgeon: Peter M Martinique, MD;  Location: [facility] CATH LAB;  Service: Cardiovascular;;  . PORTACATH PLACEMENT Right 2015  . ROTATOR CUFF REPAIR  2011  . TOTAL KNEE ARTHROPLASTY     left  . TOTAL KNEE ARTHROPLASTY  08/04/2012   right knee  . TOTAL KNEE ARTHROPLASTY  08/04/2012   Procedure: TOTAL KNEE ARTHROPLASTY;  Surgeon: Yvette Rack., MD;  Location: [place];  Service: Orthopedics;  Laterality: Right;  WITH PATELLA RESURFACING    Medications: I have reviewed the patient's current medications. Allergies: No Known Allergies  Family History  Problem Relation Age of Onset  . Unexplained death Mother 35       Considered natural causes, no autopsy  . Early death Father        Killed In World War II  . Coronary artery disease Neg Hx    Social History:  reports that he has been smoking cigarettes.  He has a 5.50 pack-year smoking history. He has never used smokeless tobacco. He  reports that he does not drink alcohol or use drugs.  Review of Systems  Respiratory: Positive for shortness of breath.   Genitourinary: Positive for frequency and urgency.  All other systems reviewed and are negative.   Physical Exam:  Vital signs in last 24 hours: Temp:  [98.5 F (36.9 C)-98.9 F (37.2 C)] 98.5 F (36.9 C) (05/01 1344) Pulse Rate:  [60-88] 88 (05/01 1344) Resp:  [18-19] 19 (05/01 1344) BP: (92-120)/(59-76) 104/61 (05/01 1344) SpO2:  [93 %-99 %] 99 % (05/01 1344) Weight:  [135.4 kg (298 lb 6.4 oz)] 135.4 kg (298 lb 6.4 oz) (05/01 0500) Physical Exam  Constitutional: He is oriented to person, place, and time.  He appears well-developed and well-nourished.  HENT:  Head: Normocephalic and atraumatic.  Eyes: Pupils are equal, round, and reactive to light. EOM are normal.  Neck: Normal range of motion. No thyromegaly present.  Cardiovascular: Normal rate and regular rhythm.  Respiratory: Effort normal. No respiratory distress.  GI: Soft. He exhibits no distension. Hernia confirmed negative in the right inguinal area and confirmed negative in the left inguinal area.  Genitourinary: Testes normal and penis normal.  Musculoskeletal: Normal range of motion. He exhibits no edema.  Lymphadenopathy:       Right: No inguinal adenopathy present.       Left: No inguinal adenopathy present.  Neurological: He is alert and oriented to person, place, and time.  Skin: Skin is warm and dry.  Psychiatric: He has a normal mood and affect. His behavior is normal. Judgment and thought content normal.    Laboratory Data:  Results for orders placed or performed during the hospital encounter of 12/26/17 (from the past 72 hour(s))  Comprehensive metabolic panel     Status: Abnormal   Collection Time: 12/26/17  6:15 PM  Result Value Ref Range   Sodium 142 135 - 145 mmol/L   Potassium 4.9 3.5 - 5.1 mmol/L   Chloride 106 101 - 111 mmol/L   CO2 23 22 - 32 mmol/L   Glucose, Bld 111 (H) 65 - 99 mg/dL   BUN 18 6 - 20 mg/dL   Creatinine, Ser 1.03 0.61 - 1.24 mg/dL   Calcium 9.5 8.9 - 10.3 mg/dL   Total Protein 7.9 6.5 - 8.1 g/dL   Albumin 4.4 3.5 - 5.0 g/dL   AST 23 15 - 41 U/L   ALT 21 17 - 63 U/L   Alkaline Phosphatase 141 (H) 38 - 126 U/L   Total Bilirubin 2.5 (H) 0.3 - 1.2 mg/dL   GFR calc non Af Amer >60 >60 mL/min   GFR calc Af Amer >60 >60 mL/min    Comment: (NOTE) The eGFR has been calculated using the CKD EPI equation. This calculation has not been validated in all clinical situations. eGFR's persistently <60 mL/min signify possible Chronic Kidney Disease.    Anion gap 13 5 - 15    Comment: Performed at  [name], [address], [place], [place] [address]  Magnesium     Status: None   Collection Time: 12/26/17  6:15 PM  Result Value Ref Range   Magnesium 2.0 1.7 - 2.4 mg/dL    Comment: Performed at [facility], [address] [place], [place] [address]  CBC WITH DIFFERENTIAL     Status: Abnormal   Collection Time: 12/26/17  6:15 PM  Result Value Ref Range   WBC 4.8 4.0 - 10.5 K/uL   RBC 4.32 4.22 - 5.81 MIL/uL   Hemoglobin 13.2 13.0 -  17.0 g/dL   HCT 42.6 39.0 - 52.0 %   MCV 98.6 78.0 - 100.0 fL   MCH 30.6 26.0 - 34.0 pg   MCHC 31.0 30.0 - 36.0 g/dL   RDW 18.7 (H) 11.5 - 15.5 %   Platelets 180 150 - 400 K/uL   Neutrophils Relative % 75 %   Neutro Abs 3.6 1.7 - 7.7 K/uL   Lymphocytes Relative 13 %   Lymphs Abs 0.6 (L) 0.7 - 4.0 K/uL   Monocytes Relative 11 %   Monocytes Absolute 0.5 0.1 - 1.0 K/uL   Eosinophils Relative 1 %   Eosinophils Absolute 0.1 0.0 - 0.7 K/uL   Basophils Relative 0 %   Basophils Absolute 0.0 0.0 - 0.1 K/uL    Comment: Performed at [facility], [address] [place], [place] [address]  TSH     Status: None   Collection Time: 12/26/17  6:15 PM  Result Value Ref Range   TSH 1.378 0.350 - 4.500 uIU/mL    Comment: Performed by a 3rd Generation assay with a functional sensitivity of <=0.01 uIU/mL. Performed at [facility], [address], [place], [place] [address]   Brain natriuretic peptide     Status: Abnormal   Collection Time: 12/26/17  6:15 PM  Result Value Ref Range   B Natriuretic Peptide 1,808.0 (H) 0.0 - 100.0 pg/mL    Comment: Performed at [facility], [address], [place], [place] [address]  Basic metabolic panel     Status: Abnormal   Collection Time: 12/27/17  6:10 AM  Result Value Ref Range   Sodium 139 135 - 145 mmol/L   Potassium 4.1 3.5 - 5.1 mmol/L   Chloride 101 101 - 111 mmol/L   CO2 26 22 - 32 mmol/L   Glucose, Bld 115 (H) 65 - 99 mg/dL   BUN 18 6 - 20 mg/dL   Creatinine, Ser 1.09 0.61 - 1.24 mg/dL   Calcium 8.9 8.9 -  10.3 mg/dL   GFR calc non Af Amer >60 >60 mL/min   GFR calc Af Amer >60 >60 mL/min    Comment: (NOTE) The eGFR has been calculated using the CKD EPI equation. This calculation has not been validated in all clinical situations. eGFR's persistently <60 mL/min signify possible Chronic Kidney Disease.    Anion gap 12 5 - 15    Comment: Performed at [facility], [address] [place], [place] [address]  Basic metabolic panel     Status: Abnormal   Collection Time: 12/28/17  9:44 AM  Result Value Ref Range   Sodium 136 135 - 145 mmol/L   Potassium 3.6 3.5 - 5.1 mmol/L   Chloride 95 (L) 101 - 111 mmol/L   CO2 29 22 - 32 mmol/L   Glucose, Bld 106 (H) 65 - 99 mg/dL   BUN 19 6 - 20 mg/dL   Creatinine, Ser 1.04 0.61 - 1.24 mg/dL   Calcium 8.9 8.9 - 10.3 mg/dL   GFR calc non Af Amer >60 >60 mL/min   GFR calc Af Amer >60 >60 mL/min    Comment: (NOTE) The eGFR has been calculated using the CKD EPI equation. This calculation has not been validated in all clinical situations. eGFR's persistently <60 mL/min signify possible Chronic Kidney Disease.    Anion gap 12 5 - 15    Comment: Performed at [facility], [address] [place], [place] [address]   No results found for this or any previous visit (from the past  240 hour(s)). Creatinine: Recent Labs    12/26/17 1815 12/27/17 0610 12/28/17 0944  CREATININE 1.03 1.09 1.04   Baseline Creatinine: 1  Impression/Assessment:  77yo with BPH with LUTs, urinary urgency with urge incontinence  Plan:  1. BPH with LUTS, urinary urgency: We discussed the management including medical therapy and surgical interventions. We discussed switching alpha blokcers and starting a 5ARI which the patient wishes to pursue. We will start uroxatral '10mg'$  and finasteride '5mg'$ . 2. Urge incontinence: I discussed the various causes including OAB, incomplete emptying, etc. We will hsi incomplete emptying with the additon of uroxatral and finasteride prior to  initiating OAB therapy. The patient should followup 1-2 weeks after discharge. He can have a voiding trial prior to discharge  Nicolette Bang 12/28/2017, 7:13 PM

## 2017-12-28 NOTE — Care Management Important Message (Signed)
Important Message  Patient Details  Name: [name] [name] MRN: [id number] Date of Birth: Nov 03, 1940   Medicare Important Message Given:  Yes    [name] 12/28/2017, 11:00 AM

## 2017-12-28 NOTE — Progress Notes (Signed)
Subjective: He says he feels okay.  His breathing is better.  He still has a Foley catheter in place because of aggressive diuresis.  He is down over 11 L.  He says his breathing is much better.  He is off oxygen now.  He used CPAP last night.  He had CT of the chest done which shows a spiculated mass in the left upper lobe and he is going to need to have a PET scan done as an outpatient.  He says he would like to have something to "fix" his heart and I told him I do not think that is going to be possible.  His concern is that he is urinating on himself and incontinent of urine when he takes his diuretic  Objective: Vital signs in last 24 hours: Temp:  [98.6 F (37 C)-98.9 F (37.2 C)] 98.7 F (37.1 C) (05/01 0515) Pulse Rate:  [60-84] 60 (05/01 0515) Resp:  [18] 18 (04/30 2105) BP: (92-120)/(59-76) 92/59 (05/01 0515) SpO2:  [93 %-98 %] 94 % (05/01 0515) Weight:  [135.4 kg (298 lb 6.4 oz)] 135.4 kg (298 lb 6.4 oz) (05/01 0500) Weight change: -12.3 kg (-27 lb 1.6 oz) Last BM Date: 12/27/17  Intake/Output from previous day: 04/30 0701 - 05/01 0700 In: 480 [P.O.:480] Out: 7650 [Urine:7650]  PHYSICAL EXAM General appearance: alert, cooperative and no distress Resp: clear to auscultation bilaterally Cardio: irregularly irregular rhythm GI: soft, non-tender; bowel sounds normal; no masses,  no organomegaly Extremities: His edema is much improved  Lab Results:  Results for orders placed or performed during the hospital encounter of 12/26/17 (from the past 48 hour(s))  Comprehensive metabolic panel     Status: Abnormal   Collection Time: 12/26/17  6:15 PM  Result Value Ref Range   Sodium 142 135 - 145 mmol/L   Potassium 4.9 3.5 - 5.1 mmol/L   Chloride 106 101 - 111 mmol/L   CO2 23 22 - 32 mmol/L   Glucose, Bld 111 (H) 65 - 99 mg/dL   BUN 18 6 - 20 mg/dL   Creatinine, Ser 1.03 0.61 - 1.24 mg/dL   Calcium 9.5 8.9 - 10.3 mg/dL   Total Protein 7.9 6.5 - 8.1 g/dL   Albumin 4.4 3.5 -  5.0 g/dL   AST 23 15 - 41 U/L   ALT 21 17 - 63 U/L   Alkaline Phosphatase 141 (H) 38 - 126 U/L   Total Bilirubin 2.5 (H) 0.3 - 1.2 mg/dL   GFR calc non Af Amer >60 >60 mL/min   GFR calc Af Amer >60 >60 mL/min    Comment: (NOTE) The eGFR has been calculated using the CKD EPI equation. This calculation has not been validated in all clinical situations. eGFR's persistently <60 mL/min signify possible Chronic Kidney Disease.    Anion gap 13 5 - 15    Comment: Performed at [facility], [address], [place], [place] [address]  Magnesium     Status: None   Collection Time: 12/26/17  6:15 PM  Result Value Ref Range   Magnesium 2.0 1.7 - 2.4 mg/dL    Comment: Performed at [name], [address], [place], [place] [address]  CBC WITH DIFFERENTIAL     Status: Abnormal   Collection Time: 12/26/17  6:15 PM  Result Value Ref Range   WBC 4.8 4.0 - 10.5 K/uL   RBC 4.32 4.22 - 5.81 MIL/uL   Hemoglobin 13.2 13.0 - 17.0 g/dL   HCT 42.6 39.0 - 52.0 %  MCV 98.6 78.0 - 100.0 fL   MCH 30.6 26.0 - 34.0 pg   MCHC 31.0 30.0 - 36.0 g/dL   RDW 18.7 (H) 11.5 - 15.5 %   Platelets 180 150 - 400 K/uL   Neutrophils Relative % 75 %   Neutro Abs 3.6 1.7 - 7.7 K/uL   Lymphocytes Relative 13 %   Lymphs Abs 0.6 (L) 0.7 - 4.0 K/uL   Monocytes Relative 11 %   Monocytes Absolute 0.5 0.1 - 1.0 K/uL   Eosinophils Relative 1 %   Eosinophils Absolute 0.1 0.0 - 0.7 K/uL   Basophils Relative 0 %   Basophils Absolute 0.0 0.0 - 0.1 K/uL    Comment: Performed at [facility], [address] [place], [place] [address]  TSH     Status: None   Collection Time: 12/26/17  6:15 PM  Result Value Ref Range   TSH 1.378 0.350 - 4.500 uIU/mL    Comment: Performed by a 3rd Generation assay with a functional sensitivity of <=0.01 uIU/mL. Performed at [facility], [address], [place], [place] [address]   Brain natriuretic peptide     Status: Abnormal   Collection Time: 12/26/17  6:15 PM  Result Value Ref Range   B  Natriuretic Peptide 1,808.0 (H) 0.0 - 100.0 pg/mL    Comment: Performed at [facility], [name] [place], [place] [address]  Basic metabolic panel     Status: Abnormal   Collection Time: 12/27/17  6:10 AM  Result Value Ref Range   Sodium 139 135 - 145 mmol/L   Potassium 4.1 3.5 - 5.1 mmol/L   Chloride 101 101 - 111 mmol/L   CO2 26 22 - 32 mmol/L   Glucose, Bld 115 (H) 65 - 99 mg/dL   BUN 18 6 - 20 mg/dL   Creatinine, Ser 1.09 0.61 - 1.24 mg/dL   Calcium 8.9 8.9 - 10.3 mg/dL   GFR calc non Af Amer >60 >60 mL/min   GFR calc Af Amer >60 >60 mL/min    Comment: (NOTE) The eGFR has been calculated using the CKD EPI equation. This calculation has not been validated in all clinical situations. eGFR's persistently <60 mL/min signify possible Chronic Kidney Disease.    Anion gap 12 5 - 15    Comment: Performed at [facility], [address], [place], [place] [address]    ABGS No results for input(s): PHART, PO2ART, TCO2, HCO3 in the last 72 hours.  Invalid input(s): PCO2 CULTURES No results found for this or any previous visit (from the past 240 hour(s)). Studies/Results: X-ray Chest Pa And Lateral  Result Date: 12/26/2017 CLINICAL DATA:  CHF history of hypertension EXAM: CHEST - 2 VIEW COMPARISON:  05/13/2017, CT chest 05/16/2017, PET-CT 05/13/2014 FINDINGS: Right-sided central venous port tip overlies the proximal right atrium. Cardiomegaly with vascular congestion and diffuse interstitial opacity suspicious for pulmonary edema. Small pleural effusions. No pneumothorax. Suspected 2.5 cm left apical lung nodule. IMPRESSION: 1. Cardiomegaly with vascular congestion and mild to moderate pulmonary edema. Small pleural effusion 2. Suspected 2.5 cm left apical lung nodule. Recommend CT chest to further evaluate. Electronically Signed   By: Donavan Foil M.D.   On: 12/26/2017 20:16   Ct Chest W Contrast  Result Date: 12/27/2017 CLINICAL DATA:  Left apical nodule seen on chest x-ray.  EXAM: CT CHEST WITH CONTRAST TECHNIQUE: Multidetector CT imaging of the chest was performed during intravenous contrast administration. CONTRAST:  69m ISOVUE-300 IOPAMIDOL (ISOVUE-300) INJECTION 61% COMPARISON:  Chest  x-ray 12/26/2017.  Chest CT 05/16/2017. FINDINGS: Cardiovascular: There is cardiomegaly. Diffuse coronary artery calcifications. Scattered aortic calcifications and tortuosity. No evidence of aortic aneurysm or dissection. Mild vascular congestion. Mediastinum/Nodes: Mildly enlarged left axillary lymph nodes, new since prior study. Index left axillary lymph node has a short axis diameter of 15 mm on image 31. Scattered small mediastinal lymph nodes are stable since prior study. Airways patent. Esophagus grossly unremarkable. Lungs/Pleura: Irregular spiculated nodule in the left upper lobe corresponding to the density seen on chest x-ray measures up to 2.7 cm on image 24. This compares to 2.1 cm on prior CT. Moderate right pleural effusion and small left pleural effusion. Dependent and bibasilar atelectasis. Compressive atelectasis in the right lower lobe. Upper Abdomen: Layering gallstones within the gallbladder. Small amount of perihepatic and perisplenic ascites. Musculoskeletal: Mild edema throughout the chest wall suggesting anasarca. No acute or focal bony abnormality. IMPRESSION: 2.7 cm spiculated nodule in the medial left upper lobe/apex concerning for lung cancer. This could be further evaluated with PET CT. Cardiomegaly, diffuse coronary artery disease. Mild vascular congestion. Moderate right pleural effusion and small left pleural effusion. Small and borderline sized scattered mediastinal lymph nodes are stable since prior study. Enlarging left axillary lymph nodes. Electronically Signed   By: Rolm Baptise M.D.   On: 12/27/2017 10:37    Medications:  Prior to Admission:  Medications Prior to Admission  Medication Sig Dispense Refill Last Dose  . clonazePAM (KLONOPIN) 1 MG tablet Take  2 tablets (2 mg total) by mouth at bedtime. 60 tablet 2 Past Week at Unknown time  . tiZANidine (ZANAFLEX) 4 MG tablet Take 4 mg by mouth 4 (four) times daily as needed for muscle spasms.   12 Past Week at Unknown time  . acetaminophen (TYLENOL) 500 MG tablet Take 1,000 mg by mouth every 6 (six) hours as needed for headache.   Taking  . aspirin 81 MG chewable tablet Chew 1 tablet (81 mg total) by mouth daily.   Taking  . atorvastatin (LIPITOR) 40 MG tablet Take 1 tablet (40 mg total) by mouth daily at 6 PM. 90 tablet 3 Taking  . celecoxib (CELEBREX) 200 MG capsule Take 1 capsule (200 mg total) by mouth 2 (two) times daily as needed for moderate pain.   Taking  . diclofenac sodium (VOLTAREN) 1 % GEL Apply 4 g topically 4 (four) times daily as needed (for leg soreness). 1 Tube 2 Taking  . furosemide (LASIX) 40 MG tablet Take 1 tablet (40 mg total) by mouth 2 (two) times daily. 60 tablet 5 Taking  . Multiple Vitamin (MULTIVITAMIN WITH MINERALS) TABS tablet Take 1 tablet by mouth daily.   Taking  . nitroGLYCERIN (NITROSTAT) 0.4 MG SL tablet Place 1 tablet (0.4 mg total) under the tongue every 5 (five) minutes as needed for chest pain (Max 3 doses within 15 minutes. Call 911 at 3rd dose).  12 Taking  . oxyCODONE-acetaminophen (PERCOCET) 10-325 MG tablet Take 1 tablet by mouth every 4 (four) hours as needed for pain.    Taking  . polyethylene glycol (MIRALAX / GLYCOLAX) packet Take 17 g by mouth daily.   Taking  . rivaroxaban (XARELTO) 20 MG TABS tablet Take 1 tablet (20 mg total) by mouth daily with supper. 30 tablet 3 Taking  . rOPINIRole (REQUIP) 1 MG tablet TAKE 2 TABLETS BY MOUTH AT BEDTIME (Patient taking differently: TAKE 2 MG BY MOUTH AT BEDTIME) 60 tablet 0 Taking  . ROZEREM 8 MG tablet Take 8 mg  by mouth at bedtime as needed for sleep.   5 Taking  . tamsulosin (FLOMAX) 0.4 MG CAPS capsule Take 2 capsules (0.8 mg total) by mouth daily. 60 capsule 5 Taking  . tobramycin-dexamethasone (TOBRADEX)  ophthalmic solution Place 2 drops into both eyes 4 (four) times daily as needed. (eye burning)  2 Taking  . triamcinolone ointment (KENALOG) 0.1 % APPLY TO AFFECTED AREA TWICE A DAY 30 g 0 Taking   Scheduled: . aspirin  81 mg Oral Daily  . atorvastatin  40 mg Oral q1800  . clonazePAM  2 mg Oral QHS  . furosemide  80 mg Intravenous Q12H  . polyethylene glycol  17 g Oral Daily  . rivaroxaban  20 mg Oral Q supper  . rOPINIRole  2 mg Oral QHS  . sodium chloride flush  3 mL Intravenous Q12H  . tamsulosin  0.8 mg Oral Daily  . triamcinolone ointment   Topical BID   Continuous:  OFB:PZWCHENIDPOEU **OR** acetaminophen, albuterol, diclofenac sodium, nitroGLYCERIN, ondansetron **OR** ondansetron (ZOFRAN) IV, oxyCODONE-acetaminophen **AND** oxyCODONE, tiZANidine  Assesment: He was admitted with heart failure.  He had been off his diuretics.  He is better now with IV diuresis.  Compliance may be a problem in the future because he says that he will not take his diuretics if he is incontinent  He is been treated for BPH but I am going to get a urology consultation regarding what we can do for his problems urinating.  It may be that he could go home with a Foley catheter but of course that sets him up for infections.  He has chronic atrial fib which is stable  He has sleep apnea which is stable  He has a lung mass that looks like a lung cancer and that will need to be evaluated with a PET scan Active Problems:   CHF (congestive heart failure) ([place])    Plan: Continue current treatments check blood testing.  Ask for urology consultation.     LOS: 2 days   Aksh Swart L 12/28/2017, 8:58 AM

## 2017-12-28 NOTE — Progress Notes (Signed)
Progress Note  Patient Name: [name] [name] Date of Encounter: 12/28/2017  Primary Cardiologist: Pixie Casino, MD   Subjective   Breathing has improved. Denies chest pain. Legs are still swollen. He asked if he would have to be on a diuretic chronically, and we discussed this in great detail. We also talked about the need for an outpatient PET scan for his lung nodule, and also about urology evaluating him while hospitalized.  Inpatient Medications    Scheduled Meds: . aspirin  81 mg Oral Daily  . atorvastatin  40 mg Oral q1800  . clonazePAM  2 mg Oral QHS  . furosemide  80 mg Intravenous Q12H  . polyethylene glycol  17 g Oral Daily  . rivaroxaban  20 mg Oral Q supper  . rOPINIRole  2 mg Oral QHS  . sodium chloride flush  3 mL Intravenous Q12H  . tamsulosin  0.8 mg Oral Daily  . triamcinolone ointment   Topical BID   Continuous Infusions:  PRN Meds: acetaminophen **OR** acetaminophen, albuterol, diclofenac sodium, nitroGLYCERIN, ondansetron **OR** ondansetron (ZOFRAN) IV, oxyCODONE-acetaminophen **AND** oxyCODONE, tiZANidine   Vital Signs    Vitals:   12/27/17 2105 12/27/17 2201 12/28/17 0500 12/28/17 0515  BP: 120/76   (!) 92/59  Pulse: 81   60  Resp: 18     Temp: 98.9 F (37.2 C)   98.7 F (37.1 C)  TempSrc: Oral   Oral  SpO2: 93% 93%  94%  Weight:   298 lb 6.4 oz (135.4 kg)   Height:        Intake/Output Summary (Last 24 hours) at 12/28/2017 0923 Last data filed at 12/28/2017 0800 Gross per 24 hour  Intake 480 ml  Output 6700 ml  Net -6220 ml   Filed Weights   12/26/17 1739 12/27/17 0500 12/28/17 0500  Weight: (!) 325 lb 8 oz (147.6 kg) (!) 330 lb 11 oz (150 kg) 298 lb 6.4 oz (135.4 kg)    Telemetry    Rate controlled atrial fibrillation with PVC's - Personally Reviewed  ECG    n/a - Personally Reviewed  Physical Exam   GEN: No acute distress.   Neck: No JVD Cardiac: Regular rate, irregular rhythm, no murmurs, rubs, or gallops.    Respiratory: Clear to auscultation bilaterally. GI: Soft, nontender, non-distended  MS: 2+ pitting b/l lower extremity edema with lipodermatosclerosis Neuro:  Nonfocal  Psych: Normal affect   Labs    Chemistry Recent Labs  Lab 12/26/17 1815 12/27/17 0610  NA 142 139  K 4.9 4.1  CL 106 101  CO2 23 26  GLUCOSE 111* 115*  BUN 18 18  CREATININE 1.03 1.09  CALCIUM 9.5 8.9  PROT 7.9  --   ALBUMIN 4.4  --   AST 23  --   ALT 21  --   ALKPHOS 141*  --   BILITOT 2.5*  --   GFRNONAA >60 >60  GFRAA >60 >60  ANIONGAP 13 12     Hematology Recent Labs  Lab 12/26/17 1815  WBC 4.8  RBC 4.32  HGB 13.2  HCT 42.6  MCV 98.6  MCH 30.6  MCHC 31.0  RDW 18.7*  PLT 180    Cardiac EnzymesNo results for input(s): TROPONINI in the last 168 hours. No results for input(s): TROPIPOC in the last 168 hours.   BNP Recent Labs  Lab 12/26/17 1815  BNP 1,808.0*     DDimer No results for input(s): DDIMER in the last 168 hours.  Radiology    X-ray Chest Pa And Lateral  Result Date: 12/26/2017 CLINICAL DATA:  CHF history of hypertension EXAM: CHEST - 2 VIEW COMPARISON:  05/13/2017, CT chest 05/16/2017, PET-CT 05/13/2014 FINDINGS: Right-sided central venous port tip overlies the proximal right atrium. Cardiomegaly with vascular congestion and diffuse interstitial opacity suspicious for pulmonary edema. Small pleural effusions. No pneumothorax. Suspected 2.5 cm left apical lung nodule. IMPRESSION: 1. Cardiomegaly with vascular congestion and mild to moderate pulmonary edema. Small pleural effusion 2. Suspected 2.5 cm left apical lung nodule. Recommend CT chest to further evaluate. Electronically Signed   By: Donavan Foil M.D.   On: 12/26/2017 20:16   Ct Chest W Contrast  Result Date: 12/27/2017 CLINICAL DATA:  Left apical nodule seen on chest x-ray. EXAM: CT CHEST WITH CONTRAST TECHNIQUE: Multidetector CT imaging of the chest was performed during intravenous contrast administration.  CONTRAST:  95mL ISOVUE-300 IOPAMIDOL (ISOVUE-300) INJECTION 61% COMPARISON:  Chest x-ray 12/26/2017.  Chest CT 05/16/2017. FINDINGS: Cardiovascular: There is cardiomegaly. Diffuse coronary artery calcifications. Scattered aortic calcifications and tortuosity. No evidence of aortic aneurysm or dissection. Mild vascular congestion. Mediastinum/Nodes: Mildly enlarged left axillary lymph nodes, new since prior study. Index left axillary lymph node has a short axis diameter of 15 mm on image 31. Scattered small mediastinal lymph nodes are stable since prior study. Airways patent. Esophagus grossly unremarkable. Lungs/Pleura: Irregular spiculated nodule in the left upper lobe corresponding to the density seen on chest x-ray measures up to 2.7 cm on image 24. This compares to 2.1 cm on prior CT. Moderate right pleural effusion and small left pleural effusion. Dependent and bibasilar atelectasis. Compressive atelectasis in the right lower lobe. Upper Abdomen: Layering gallstones within the gallbladder. Small amount of perihepatic and perisplenic ascites. Musculoskeletal: Mild edema throughout the chest wall suggesting anasarca. No acute or focal bony abnormality. IMPRESSION: 2.7 cm spiculated nodule in the medial left upper lobe/apex concerning for lung cancer. This could be further evaluated with PET CT. Cardiomegaly, diffuse coronary artery disease. Mild vascular congestion. Moderate right pleural effusion and small left pleural effusion. Small and borderline sized scattered mediastinal lymph nodes are stable since prior study. Enlarging left axillary lymph nodes. Electronically Signed   By: Rolm Baptise M.D.   On: 12/27/2017 10:37    Cardiac Studies   Echocardiogram (12/27/17):  Study Conclusions  - Left ventricle: The cavity size was normal. Wall thickness was   increased in a pattern of mild LVH. Systolic function was mildly   to moderately reduced. The estimated ejection fraction was in the   range of 40%  to 45%. The study was not technically sufficient to   allow evaluation of LV diastolic dysfunction due to atrial   fibrillation. Doppler parameters are consistent with high   ventricular filling pressure. - Regional wall motion abnormality: Hypokinesis of the mid inferior   and mid inferolateral myocardium; mild hypokinesis of the apical   anterior myocardium. - Aortic valve: Mildly thickened, mildly calcified leaflets. - Mitral valve: Moderate eccentric regurgitation. Degree of   severity may be underestimated due to eccentricity of jet. - Left atrium: The atrium was severely dilated. - Right ventricle: The cavity size was mildly dilated. Systolic   function was moderately reduced. - Right atrium: The atrium was moderately dilated. - Tricuspid valve: There was mild regurgitation. - Inferior vena cava: The vessel was dilated. The respirophasic   diameter changes were blunted (< 50%), consistent with elevated   central venous pressure. Estimated CVP 15 mmHg.   Patient  Profile     77 y.o. male with past medical history of CAD (s/p STEMI in 09/2014 with DES to LAD), chronic combined systolic and diastolic CHF, ischemic cardiomyopathy, PAF (on Eliquis for anticoagulation), lymphedema, HTN, HLD, OSA (on CPAP), COPD and mitral regurgitation hospitalized with decompensated CHF due to medication noncompliance.  Assessment & Plan    1. Acute on Chronic Combined Systolic and Diastolic CHF: Symptomatically improved but still has marked leg edema which is improving. Has diuresed nearly 13 L on IV Lasix 80 mg bid, which I would continue for now. Would consider decreasing dose tomorrow. BMET is pending. Echo reviewed above, EF remains at 40-45%. I emphasized the importance of taking diuretics daily and the detrimental consequences he could experience if he doesn't. He is awaiting a urology evaluation for urinary incontinence.  May need to consider once daily dosing of Lasix at the time of discharge  for improved compliance.   2. CAD: Symptomatically stable. Continue ASA and stain. BP is low normal so will hold off on instituting beta blockers. I will defer to his primary cardiologist. Will also defer continuing ASA as he is also on Xarelto for anticoagulation for atrial fibrillation.   3. Paroxysmal Atrial Fibrillation: HR controlled. Remains on Xarelto 20 mg daily for anticoagulation.  4. Mitral Regurgitation: At least moderate but severity may be underestimated due to eccentricity of jet.  5. HTN: BP low normal with SBP's in low 90 range.  6. HLD: FLP in 04/2017 showed total cholesterol of 127, HDL 27, and LDL 92. Not at goal of LDL < 70. - currently on Atorvastatin 40mg  daily. Would consider further titration to 80mg  daily as an outpatient if still not at goal.   7. OSA - compliance with CPAP encouraged.   8. Lung Nodule: Chest CT demonstrated a spiculated 2.7 cm nodule in the medial left upper lobe/apex concerning for lung cancer.  He will likely need a PET scan in the outpatient setting for further work-up   For questions or updates, please contact [facility] Please consult [email or web address] for contact info under Cardiology/STEMI.      Signed, Kate Sable, MD  12/28/2017, 9:23 AM

## 2017-12-28 NOTE — Care Management Note (Signed)
Case Management Note  Patient Details  Name: [name] [name] MRN: [id number] Date of Birth: 1940-11-21  Subjective/Objective:      Admitted with CHF exacerbation. Pt is from home, lives with his daughter who cooks but doesn't clean. Pt has PCP, he drives and access the community. He says that he is compliant with a low salt diet (unsure if he really understands what low Na means). He has a scale but needs to put a battary in it, he has a battery to put in it he just hasn't done that yet. He is non-compliant with weighing himself. He reports compliance with medications with the exception of his lasix, he tells me about his incontinence. He tells me about his hx of lung cancer and chemo and of the new finding he will have to follow up on. He has a CPAP at home, he does not have oxygen pta. He uses cane with ambulation. This is his first admission, he is on the [facility] registry.             Action/Plan: Plan for DC home with self care. Pt is not homebound. CM will refer to dietician for education and [facility] for disease management. Pt plans to follow up with urology to determine cause of incontinence. He wants to be compliance with lasix but "just cant pee of himself".    Expected Discharge Date:  12/27/17               Expected Discharge Plan:  Home/Self Care  In-House Referral:  Nutrition  Discharge planning Services  CM Consult  Status of Service:  In process, will continue to follow   Sherald Barge, RN 12/28/2017, 1:46 PM

## 2017-12-29 DIAGNOSIS — I255 Ischemic cardiomyopathy: Secondary | ICD-10-CM

## 2017-12-29 DIAGNOSIS — N401 Enlarged prostate with lower urinary tract symptoms: Secondary | ICD-10-CM

## 2017-12-29 DIAGNOSIS — R3914 Feeling of incomplete bladder emptying: Secondary | ICD-10-CM

## 2017-12-29 LAB — BASIC METABOLIC PANEL
Anion gap: 12 (ref 5–15)
BUN: 23 mg/dL — AB (ref 6–20)
CALCIUM: 8.7 mg/dL — AB (ref 8.9–10.3)
CO2: 30 mmol/L (ref 22–32)
CREATININE: 1.09 mg/dL (ref 0.61–1.24)
Chloride: 94 mmol/L — ABNORMAL LOW (ref 101–111)
GFR calc Af Amer: >60 mL/min (ref >60)
GLUCOSE: 95 mg/dL (ref 65–99)
Potassium: 4.3 mmol/L (ref 3.5–5.1)
Sodium: 136 mmol/L (ref 135–145)

## 2017-12-29 MED ORDER — ALFUZOSIN HCL ER 10 MG PO TB24
10.0000 mg | ORAL_TABLET | Freq: Every day | ORAL | Status: DC
  Administered 2017-12-29 – 2017-12-30 (×2): 10 mg via ORAL
  Filled 2017-12-29 (×6): qty 1

## 2017-12-29 MED ORDER — TORSEMIDE 20 MG PO TABS
40.0000 mg | ORAL_TABLET | Freq: Every day | ORAL | Status: DC
  Administered 2017-12-29 – 2017-12-30 (×2): 40 mg via ORAL
  Filled 2017-12-29 (×2): qty 2

## 2017-12-29 MED ORDER — FINASTERIDE 5 MG PO TABS
5.0000 mg | ORAL_TABLET | Freq: Every day | ORAL | Status: DC
  Administered 2017-12-29 – 2017-12-30 (×2): 5 mg via ORAL
  Filled 2017-12-29 (×6): qty 1

## 2017-12-29 NOTE — Plan of Care (Signed)
Nutrition Education Note  RD consulted for nutrition education regarding new onset CHF.  Reviewed pt's dietary recall: Breakfast: banana and raisin brain cereal Lunch/Dinner: baked potato, beef tips with gravy, rice OR ham and pinto beans  Pt states that he used to drink regular V-8 juice but that his daughter is purchasing the lower sodium version. Pt does not salt food at the table and will share with his daughter the importance of not salting food while cooking. Pt is on a budget and lives with his daughter. Discussed budget-friendly options.  RD provided "Heart Failure Nutrition Therapy" handout from the [employer]. Using pt's dietary recall, provided examples on ways to decrease sodium intake in diet. Discouraged intake of processed foods and use of salt shaker. Encouraged fresh fruits and vegetables as well as whole grain sources of carbohydrates to maximize fiber intake.   RD discussed why it is important for patient to adhere to diet recommendations and emphasized the role of fluids, foods to avoid, and importance of weighing self daily. Teach back method used.  Expect fair compliance.  Body mass index is 41.62 kg/m. Pt meets criteria for Morbid Obesity based on current BMI.  Current diet order is Heart Healthy, patient is consuming approximately 75% of meals at this time. Labs and medications reviewed. No further nutrition interventions warranted at this time. RD contact information provided. If additional nutrition issues arise, please re-consult RD.   Gaynell Face, MS, RD, LDN Pager: [phone] Weekend/After Hours: [phone]

## 2017-12-29 NOTE — Plan of Care (Signed)
Patient asked for iv to be removed. He states, "I'm on pills now". Writer checked iv which was already out of hand. Notified primary nurse patient only wants the iv replaced if necessary. At this time he requests iv to remain out.

## 2017-12-29 NOTE — Progress Notes (Signed)
Subjective: He looks substantially better.  He continues with diuresis.  Urology consult noted and appreciated.  He is still having significant swelling of his legs but his breathing is much better.  No chest pain and no other new complaints except some cramping in his feet  Objective: Vital signs in last 24 hours: Temp:  [97.9 F (36.6 C)-98.7 F (37.1 C)] 97.9 F (36.6 C) (05/02 0622) Pulse Rate:  [70-88] 76 (05/02 0630) Resp:  [19-20] 20 (05/01 2227) BP: (84-104)/(56-69) 100/66 (05/02 0630) SpO2:  [92 %-99 %] 92 % (05/02 0622) Weight change:  Last BM Date: 12/27/17  Intake/Output from previous day: 05/01 0701 - 05/02 0700 In: 840 [P.O.:840] Out: 6300 [Urine:6300]  PHYSICAL EXAM General appearance: alert, cooperative and no distress Resp: clear to auscultation bilaterally Cardio: irregularly irregular rhythm GI: soft, non-tender; bowel sounds normal; no masses,  no organomegaly Extremities: He still has edema of his legs bilaterally.  Lab Results:  Results for orders placed or performed during the hospital encounter of 12/26/17 (from the past 48 hour(s))  Basic metabolic panel     Status: Abnormal   Collection Time: 12/28/17  9:44 AM  Result Value Ref Range   Sodium 136 135 - 145 mmol/L   Potassium 3.6 3.5 - 5.1 mmol/L   Chloride 95 (L) 101 - 111 mmol/L   CO2 29 22 - 32 mmol/L   Glucose, Bld 106 (H) 65 - 99 mg/dL   BUN 19 6 - 20 mg/dL   Creatinine, Ser 1.04 0.61 - 1.24 mg/dL   Calcium 8.9 8.9 - 10.3 mg/dL   GFR calc non Af Amer >60 >60 mL/min   GFR calc Af Amer >60 >60 mL/min    Comment: (NOTE) The eGFR has been calculated using the CKD EPI equation. This calculation has not been validated in all clinical situations. eGFR's persistently <60 mL/min signify possible Chronic Kidney Disease.    Anion gap 12 5 - 15    Comment: Performed at [facility], [address], [place], [place] [address]  Basic metabolic panel     Status: Abnormal   Collection Time: 12/29/17   4:44 AM  Result Value Ref Range   Sodium 136 135 - 145 mmol/L   Potassium 4.3 3.5 - 5.1 mmol/L   Chloride 94 (L) 101 - 111 mmol/L   CO2 30 22 - 32 mmol/L   Glucose, Bld 95 65 - 99 mg/dL   BUN 23 (H) 6 - 20 mg/dL   Creatinine, Ser 1.09 0.61 - 1.24 mg/dL   Calcium 8.7 (L) 8.9 - 10.3 mg/dL   GFR calc non Af Amer >60 >60 mL/min   GFR calc Af Amer >60 >60 mL/min    Comment: (NOTE) The eGFR has been calculated using the CKD EPI equation. This calculation has not been validated in all clinical situations. eGFR's persistently <60 mL/min signify possible Chronic Kidney Disease.    Anion gap 12 5 - 15    Comment: Performed at [facility], [address] [place],  [address]    ABGS No results for input(s): PHART, PO2ART, TCO2, HCO3 in the last 72 hours.  Invalid input(s): PCO2 CULTURES No results found for this or any previous visit (from the past 240 hour(s)). Studies/Results: Ct Chest W Contrast  Result Date: 12/27/2017 CLINICAL DATA:  Left apical nodule seen on chest x-ray. EXAM: CT CHEST WITH CONTRAST TECHNIQUE: Multidetector CT imaging of the chest was performed during intravenous contrast administration. CONTRAST:  70m ISOVUE-300 IOPAMIDOL (ISOVUE-300) INJECTION 61% COMPARISON:  Chest x-ray 12/26/2017.  Chest CT 05/16/2017. FINDINGS: Cardiovascular: There is cardiomegaly. Diffuse coronary artery calcifications. Scattered aortic calcifications and tortuosity. No evidence of aortic aneurysm or dissection. Mild vascular congestion. Mediastinum/Nodes: Mildly enlarged left axillary lymph nodes, new since prior study. Index left axillary lymph node has a short axis diameter of 15 mm on image 31. Scattered small mediastinal lymph nodes are stable since prior study. Airways patent. Esophagus grossly unremarkable. Lungs/Pleura: Irregular spiculated nodule in the left upper lobe corresponding to the density seen on chest x-ray measures up to 2.7 cm on image 24. This compares to 2.1 cm on  prior CT. Moderate right pleural effusion and small left pleural effusion. Dependent and bibasilar atelectasis. Compressive atelectasis in the right lower lobe. Upper Abdomen: Layering gallstones within the gallbladder. Small amount of perihepatic and perisplenic ascites. Musculoskeletal: Mild edema throughout the chest wall suggesting anasarca. No acute or focal bony abnormality. IMPRESSION: 2.7 cm spiculated nodule in the medial left upper lobe/apex concerning for lung cancer. This could be further evaluated with PET CT. Cardiomegaly, diffuse coronary artery disease. Mild vascular congestion. Moderate right pleural effusion and small left pleural effusion. Small and borderline sized scattered mediastinal lymph nodes are stable since prior study. Enlarging left axillary lymph nodes. Electronically Signed   By: Rolm Baptise M.D.   On: 12/27/2017 10:37    Medications:  Prior to Admission:  Medications Prior to Admission  Medication Sig Dispense Refill Last Dose  . clonazePAM (KLONOPIN) 1 MG tablet Take 2 tablets (2 mg total) by mouth at bedtime. 60 tablet 2 Past Week at Unknown time  . tiZANidine (ZANAFLEX) 4 MG tablet Take 4 mg by mouth 4 (four) times daily as needed for muscle spasms.   12 Past Week at Unknown time  . acetaminophen (TYLENOL) 500 MG tablet Take 1,000 mg by mouth every 6 (six) hours as needed for headache.   Taking  . aspirin 81 MG chewable tablet Chew 1 tablet (81 mg total) by mouth daily.   Taking  . atorvastatin (LIPITOR) 40 MG tablet Take 1 tablet (40 mg total) by mouth daily at 6 PM. 90 tablet 3 Taking  . celecoxib (CELEBREX) 200 MG capsule Take 1 capsule (200 mg total) by mouth 2 (two) times daily as needed for moderate pain.   Taking  . diclofenac sodium (VOLTAREN) 1 % GEL Apply 4 g topically 4 (four) times daily as needed (for leg soreness). 1 Tube 2 Taking  . furosemide (LASIX) 40 MG tablet Take 1 tablet (40 mg total) by mouth 2 (two) times daily. 60 tablet 5 Taking  .  Multiple Vitamin (MULTIVITAMIN WITH MINERALS) TABS tablet Take 1 tablet by mouth daily.   Taking  . nitroGLYCERIN (NITROSTAT) 0.4 MG SL tablet Place 1 tablet (0.4 mg total) under the tongue every 5 (five) minutes as needed for chest pain (Max 3 doses within 15 minutes. Call 911 at 3rd dose).  12 Taking  . oxyCODONE-acetaminophen (PERCOCET) 10-325 MG tablet Take 1 tablet by mouth every 4 (four) hours as needed for pain.    Taking  . polyethylene glycol (MIRALAX / GLYCOLAX) packet Take 17 g by mouth daily.   Taking  . rivaroxaban (XARELTO) 20 MG TABS tablet Take 1 tablet (20 mg total) by mouth daily with supper. 30 tablet 3 Taking  . rOPINIRole (REQUIP) 1 MG tablet TAKE 2 TABLETS BY MOUTH AT BEDTIME (Patient taking differently: TAKE 2 MG BY MOUTH AT BEDTIME) 60 tablet 0 Taking  . ROZEREM 8 MG tablet Take  8 mg by mouth at bedtime as needed for sleep.   5 Taking  . tamsulosin (FLOMAX) 0.4 MG CAPS capsule Take 2 capsules (0.8 mg total) by mouth daily. 60 capsule 5 Taking  . tobramycin-dexamethasone (TOBRADEX) ophthalmic solution Place 2 drops into both eyes 4 (four) times daily as needed. (eye burning)  2 Taking  . triamcinolone ointment (KENALOG) 0.1 % APPLY TO AFFECTED AREA TWICE A DAY 30 g 0 Taking   Scheduled: . alfuzosin  10 mg Oral Q breakfast  . aspirin  81 mg Oral Daily  . atorvastatin  40 mg Oral q1800  . clonazePAM  2 mg Oral QHS  . finasteride  5 mg Oral Daily  . polyethylene glycol  17 g Oral Daily  . potassium chloride  20 mEq Oral BID  . rivaroxaban  20 mg Oral Q supper  . rOPINIRole  2 mg Oral QHS  . sodium chloride flush  3 mL Intravenous Q12H  . torsemide  40 mg Oral Daily  . triamcinolone ointment   Topical BID   Continuous:  PCH:EKBTCYELYHTMB **OR** acetaminophen, albuterol, diclofenac sodium, nitroGLYCERIN, ondansetron **OR** ondansetron (ZOFRAN) IV, oxyCODONE-acetaminophen **AND** oxyCODONE, tiZANidine  Assesment: He was admitted with acute congestive heart failure.  This  is related to him not taking his medications.  He is much better but still has swelling of his legs.  He has ischemic cardiomyopathy but no complaints of chest pain  He has persistent atrial fib which is stable on anticoagulation  He has BPH and has had difficulty with taking his diuretics because he has incontinence.  Urology suggestions have been implemented  He has venous insufficiency of his legs and that is 1 of the reasons I think that his swelling has not totally gone away. Active Problems:   OSA- on C-pap   RESTLESS LEG SYNDROME   Insomnia   Ischemic cardiomyopathy   Morbid obesity due to excess calories (HCC)   Lumbar foraminal stenosis   Persistent atrial fibrillation (HCC)   Mitral valve insufficiency   Venous insufficiency of both lower extremities   BPH (benign prostatic hyperplasia)   CHF (congestive heart failure) ([place])    Plan: Continue treatments.  Switch to oral torsemide.  See if we can get a wedge under the foot of his bed and try Ace wraps on his feet and legs    LOS: 3 days   Clementina Mareno L 12/29/2017, 9:01 AM

## 2017-12-29 NOTE — Progress Notes (Addendum)
Progress Note  Patient Name: [name] [name] Date of Encounter: 12/29/2017  Primary Cardiologist: Pixie Casino, MD   Subjective   Breathing improved, close to baseline. Still with significant lower extremity edema. Denies any chest pain or palpitations.   Inpatient Medications    Scheduled Meds: . alfuzosin  10 mg Oral Q breakfast  . aspirin  81 mg Oral Daily  . atorvastatin  40 mg Oral q1800  . clonazePAM  2 mg Oral QHS  . finasteride  5 mg Oral Daily  . polyethylene glycol  17 g Oral Daily  . potassium chloride  20 mEq Oral BID  . rivaroxaban  20 mg Oral Q supper  . rOPINIRole  2 mg Oral QHS  . sodium chloride flush  3 mL Intravenous Q12H  . torsemide  40 mg Oral Daily  . triamcinolone ointment   Topical BID   Continuous Infusions:  PRN Meds: acetaminophen **OR** acetaminophen, albuterol, diclofenac sodium, nitroGLYCERIN, ondansetron **OR** ondansetron (ZOFRAN) IV, oxyCODONE-acetaminophen **AND** oxyCODONE, tiZANidine   Vital Signs    Vitals:   12/28/17 2227 12/29/17 0443 12/29/17 0622 12/29/17 0630  BP: 100/69 (!) 87/60 (!) 84/56 100/66  Pulse: 78 72 70 76  Resp: 20     Temp: 98.7 F (37.1 C) 98.5 F (36.9 C) 97.9 F (36.6 C)   TempSrc: Oral Oral Oral   SpO2: 98% 93% 92%   Weight:      Height:        Intake/Output Summary (Last 24 hours) at 12/29/2017 0911 Last data filed at 12/28/2017 2229 Gross per 24 hour  Intake 480 ml  Output 4550 ml  Net -4070 ml   Filed Weights   12/26/17 1739 12/27/17 0500 12/28/17 0500  Weight: (!) 325 lb 8 oz (147.6 kg) (!) 330 lb 11 oz (150 kg) 298 lb 6.4 oz (135.4 kg)    Telemetry    Atrial fibrillation, HR in 70's to 80's with occasional PVC's. - Personally Reviewed  ECG    No new tracings.   Physical Exam   General: Well developed, well nourished Caucasian male appearing in no acute distress. Head: Normocephalic, atraumatic.  Neck: Supple without bruits, JVD not elevated. Lungs:  Resp regular and unlabored,  CTA without wheezing or rales. Heart: Irregularly irregular, S1, S2, no S3, S4, or murmur; no rub. Abdomen: Soft, non-tender, non-distended with normoactive bowel sounds. No hepatomegaly. No rebound/guarding. No obvious abdominal masses. Extremities: No clubbing or cyanosis, 1+ pitting edema bilaterally. Distal pedal pulses are 2+ bilaterally. Neuro: Alert and oriented X 3. Moves all extremities spontaneously. Psych: Normal affect.  Labs    Chemistry Recent Labs  Lab 12/26/17 1815 12/27/17 0610 12/28/17 0944 12/29/17 0444  NA 142 139 136 136  K 4.9 4.1 3.6 4.3  CL 106 101 95* 94*  CO2 23 26 29 30   GLUCOSE 111* 115* 106* 95  BUN 18 18 19  23*  CREATININE 1.03 1.09 1.04 1.09  CALCIUM 9.5 8.9 8.9 8.7*  PROT 7.9  --   --   --   ALBUMIN 4.4  --   --   --   AST 23  --   --   --   ALT 21  --   --   --   ALKPHOS 141*  --   --   --   BILITOT 2.5*  --   --   --   GFRNONAA >60 >60 >60 >60  GFRAA >60 >60 >60 >60  ANIONGAP 13 12 12  12  Hematology Recent Labs  Lab 12/26/17 1815  WBC 4.8  RBC 4.32  HGB 13.2  HCT 42.6  MCV 98.6  MCH 30.6  MCHC 31.0  RDW 18.7*  PLT 180    Cardiac EnzymesNo results for input(s): TROPONINI in the last 168 hours. No results for input(s): TROPIPOC in the last 168 hours.   BNP Recent Labs  Lab 12/26/17 1815  BNP 1,808.0*     DDimer No results for input(s): DDIMER in the last 168 hours.   Radiology    Ct Chest W Contrast  Result Date: 12/27/2017 CLINICAL DATA:  Left apical nodule seen on chest x-ray. EXAM: CT CHEST WITH CONTRAST TECHNIQUE: Multidetector CT imaging of the chest was performed during intravenous contrast administration. CONTRAST:  60mL ISOVUE-300 IOPAMIDOL (ISOVUE-300) INJECTION 61% COMPARISON:  Chest x-ray 12/26/2017.  Chest CT 05/16/2017. FINDINGS: Cardiovascular: There is cardiomegaly. Diffuse coronary artery calcifications. Scattered aortic calcifications and tortuosity. No evidence of aortic aneurysm or dissection. Mild  vascular congestion. Mediastinum/Nodes: Mildly enlarged left axillary lymph nodes, new since prior study. Index left axillary lymph node has a short axis diameter of 15 mm on image 31. Scattered small mediastinal lymph nodes are stable since prior study. Airways patent. Esophagus grossly unremarkable. Lungs/Pleura: Irregular spiculated nodule in the left upper lobe corresponding to the density seen on chest x-ray measures up to 2.7 cm on image 24. This compares to 2.1 cm on prior CT. Moderate right pleural effusion and small left pleural effusion. Dependent and bibasilar atelectasis. Compressive atelectasis in the right lower lobe. Upper Abdomen: Layering gallstones within the gallbladder. Small amount of perihepatic and perisplenic ascites. Musculoskeletal: Mild edema throughout the chest wall suggesting anasarca. No acute or focal bony abnormality. IMPRESSION: 2.7 cm spiculated nodule in the medial left upper lobe/apex concerning for lung cancer. This could be further evaluated with PET CT. Cardiomegaly, diffuse coronary artery disease. Mild vascular congestion. Moderate right pleural effusion and small left pleural effusion. Small and borderline sized scattered mediastinal lymph nodes are stable since prior study. Enlarging left axillary lymph nodes. Electronically Signed   By: Rolm Baptise M.D.   On: 12/27/2017 10:37    Cardiac Studies   Echocardiogram: 12/27/2017 Study Conclusions  - Left ventricle: The cavity size was normal. Wall thickness was   increased in a pattern of mild LVH. Systolic function was mildly   to moderately reduced. The estimated ejection fraction was in the   range of 40% to 45%. The study was not technically sufficient to   allow evaluation of LV diastolic dysfunction due to atrial   fibrillation. Doppler parameters are consistent with high   ventricular filling pressure. - Regional wall motion abnormality: Hypokinesis of the mid inferior   and mid inferolateral myocardium;  mild hypokinesis of the apical   anterior myocardium. - Aortic valve: Mildly thickened, mildly calcified leaflets. - Mitral valve: Moderate eccentric regurgitation. Degree of   severity may be underestimated due to eccentricity of jet. - Left atrium: The atrium was severely dilated. - Right ventricle: The cavity size was mildly dilated. Systolic   function was moderately reduced. - Right atrium: The atrium was moderately dilated. - Tricuspid valve: There was mild regurgitation. - Inferior vena cava: The vessel was dilated. The respirophasic   diameter changes were blunted (< 50%), consistent with elevated   central venous pressure. Estimated CVP 15 mmHg.  Patient Profile     77 y.o. male with past medical history of CAD (s/p STEMI in 09/2014 with DES to LAD), chronic combined  systolic and diastolic CHF, ischemic cardiomyopathy, PAF (on Eliquis for anticoagulation), lymphedema, HTN, HLD, OSA (on CPAP), COPD and mitral regurgitationhospitalized with decompensated CHF due to medication noncompliance.  Assessment & Plan    1. Acute on Chronic Combined Systolic and Diastolic CHF - the patient has a known reduced EF of 40-45% by echo in 04/2017 with similar results by echo this admission. Presented with worsening dyspnea and edema in the setting of having not taken Lasix in over 4 weeks due to frequent urination and episodes of incontinence. Urology has been consulted and he has been started on Finasteride and Uroxatral. - he was started on IV Lasix 80mg  BID upon admission and is overall -16.6L with weight having declined from 330 lbs to 298 lbs. He has been switched to PO Torsemide 40mg  daily by the admitting team. Creatinine remains stable at 1.09 and K+ at 4.3. He will need a repeat BMET within two weeks following hospital discharge.   2. CAD - s/p STEMI in 09/2014 with DES to LAD. He denies any recent anginal symptoms.  - remains on ASA and statin therapy. Could likely discontinue ASA as he  is also on Xarelto but would defer this decision to his Primary Cardiologist.   3. Paroxysmal Atrial Fibrillation - HR well-controlled on telemetry, improved with diuresis.  - Continue Xarelto 20 mg daily for anticoagulation.  4. Mitral Regurgitation - Moderate to severe by echocardiogram in 04/2017. Similar results by echo this admission. Continue to follow in the outpatient setting.   5. HTN - BP has been variable at 84/56 - 104/69 within the past 24 hours. Soft in the setting of IV diuresis. Has been switched to PO Torsemide.   6. HLD - FLP in 04/2017 showed total cholesterol of 127, HDL 27, and LDL 92. Not at goal of LDL < 70. - currently on Atorvastatin 40mg  daily. Consider titration to 80mg  daily as an outpatient.   7. OSA - compliance with CPAP encouraged.   8. Lung Nodule - noted on CXR upon admission. Chest CT shows a 2.7 cm spiculated nodule in the medial left upper lobe/apex concerning for lung cancer.  - being followed by Pulmonology.    For questions or updates, please contact [facility] Please consult [email or web address] for contact info under Cardiology/STEMI.   Signed, Erma Heritage , PA-C 9:11 AM 12/29/2017 Pager: [phone]  The patient was seen and examined, and I agree with the history, physical exam, assessment and plan as documented above.  He is feeling much better and has had an excellent diuretic response. Internal medicine has switched him to oral torsemide. He also has chronic leg swelling from chronic venous insufficiency. He has been evaluated by urology and I have reviewed their recommendations.  Agree with stopping ASA as he is on Xarelto but will defer to primary cardiologist.  He is wheezing so this may be reason (COPD) beta blockers have not been instituted.  I explained the importance of medication compliance with him.  No further recommendations. Will sign off.   Kate Sable, MD, [facility]  12/29/2017 10:10 AM

## 2017-12-30 ENCOUNTER — Other Ambulatory Visit: Payer: Self-pay

## 2017-12-30 LAB — BASIC METABOLIC PANEL
Anion gap: 14 (ref 5–15)
BUN: 26 mg/dL — ABNORMAL HIGH (ref 6–20)
CO2: 28 mmol/L (ref 22–32)
CREATININE: 1.38 mg/dL — AB (ref 0.61–1.24)
Calcium: 8.4 mg/dL — ABNORMAL LOW (ref 8.9–10.3)
Chloride: 94 mmol/L — ABNORMAL LOW (ref 101–111)
GFR, EST AFRICAN AMERICAN: 56 mL/min — AB (ref >60)
GFR, EST NON AFRICAN AMERICAN: 48 mL/min — AB (ref >60)
Glucose, Bld: 92 mg/dL (ref 65–99)
Potassium: 4.3 mmol/L (ref 3.5–5.1)
Sodium: 136 mmol/L (ref 135–145)

## 2017-12-30 MED ORDER — FINASTERIDE 5 MG PO TABS: 5.0000 mg | ORAL_TABLET | Freq: Every day | ORAL | 12 refills | Status: AC

## 2017-12-30 MED ORDER — TORSEMIDE 20 MG PO TABS: 40.0000 mg | ORAL_TABLET | Freq: Every day | ORAL | 12 refills | Status: DC

## 2017-12-30 MED ORDER — ALFUZOSIN HCL ER 10 MG PO TB24: 10.0000 mg | ORAL_TABLET | Freq: Every day | ORAL | 12 refills | Status: DC

## 2017-12-30 MED ORDER — POTASSIUM CHLORIDE CRYS ER 20 MEQ PO TBCR: 20.0000 meq | EXTENDED_RELEASE_TABLET | Freq: Two times a day (BID) | ORAL | 12 refills | Status: DC

## 2017-12-30 MED ORDER — POLYETHYLENE GLYCOL 3350 17 G PO PACK: 17.0000 g | PACK | Freq: Every day | ORAL | 0 refills | Status: DC

## 2017-12-30 NOTE — Patient Outreach (Signed)
[facility]) Care Management  12/30/2017  [name] [name] 1941-03-28 [id number]  BSW received referral on today's date due to patient needing assistance with transportation. BSW contacted patient to follow up on transportation needs. HIPAA identifiers verified. BSW introduced self and reason for call. Patient denied any current or future transportation needs stating "I do not need help with transportation. I need help paying for my medicine".   BSW verified with patient that he had reliable transportation to and from physician appointments. Patient stated "Yes I do". BSW informed patient that a pharmacist was assigned to his case and would be contacting him to assist with medication costs. BSW to resolve social work episode and remove self from patients care team due to denial of any social work needs.  Daneen Schick, BSW, CDP Social Worker Cell # [phone] [name]

## 2017-12-30 NOTE — Care Management Note (Signed)
Case Management Note  Patient Details  Name: [name] [name] MRN: [id number] Date of Birth: Jul 28, 1941  Expected Discharge Date:  12/30/17               Expected Discharge Plan:  Home/Self Care  In-House Referral:  Nutrition  Discharge planning Services  CM Consult  Post Acute Care Choice:  NA Choice offered to:  NA  Status of Service:  Completed, signed off  Additional Comments: DC home today with self car.e Pt has accepted [facility] services and will f/u with uronology about incontinence.   Sherald Barge, RN 12/30/2017, 9:56 AM

## 2017-12-30 NOTE — Progress Notes (Signed)
Foley removed earlier and patient has voided.  Discharge instructions reviewed and scripts called to [facility]. Daughter to give ride home

## 2017-12-30 NOTE — Progress Notes (Signed)
Subjective: He feels much better.  He wants to go home.  His IV has come out.    Objective: Vital signs in last 24 hours: Temp:  [97.7 F (36.5 C)-98.3 F (36.8 C)] 97.7 F (36.5 C) (05/03 0607) Pulse Rate:  [76-80] 76 (05/03 0607) Resp:  [15-19] 15 (05/03 0607) BP: (92-119)/(66-86) 104/66 (05/03 0607) SpO2:  [92 %-95 %] 95 % (05/03 0607) Weight change:  Last BM Date: 12/29/17  Intake/Output from previous day: 05/02 0701 - 05/03 0700 In: 1440 [P.O.:1440] Out: 3500 [Urine:3500]  PHYSICAL EXAM General appearance: alert, cooperative and no distress Resp: clear to auscultation bilaterally Cardio: irregularly irregular rhythm GI: soft, non-tender; bowel sounds normal; no masses,  no organomegaly Extremities: He still has some edema  Lab Results:  Results for orders placed or performed during the hospital encounter of 12/26/17 (from the past 48 hour(s))  Basic metabolic panel     Status: Abnormal   Collection Time: 12/28/17  9:44 AM  Result Value Ref Range   Sodium 136 135 - 145 mmol/L   Potassium 3.6 3.5 - 5.1 mmol/L   Chloride 95 (L) 101 - 111 mmol/L   CO2 29 22 - 32 mmol/L   Glucose, Bld 106 (H) 65 - 99 mg/dL   BUN 19 6 - 20 mg/dL   Creatinine, Ser 1.04 0.61 - 1.24 mg/dL   Calcium 8.9 8.9 - 10.3 mg/dL   GFR calc non Af Amer >60 >60 mL/min   GFR calc Af Amer >60 >60 mL/min    Comment: (NOTE) The eGFR has been calculated using the CKD EPI equation. This calculation has not been validated in all clinical situations. eGFR's persistently <60 mL/min signify possible Chronic Kidney Disease.    Anion gap 12 5 - 15    Comment: Performed at [facility], [address], [place], [place] [address]  Basic metabolic panel     Status: Abnormal   Collection Time: 12/29/17  4:44 AM  Result Value Ref Range   Sodium 136 135 - 145 mmol/L   Potassium 4.3 3.5 - 5.1 mmol/L   Chloride 94 (L) 101 - 111 mmol/L   CO2 30 22 - 32 mmol/L   Glucose, Bld 95 65 - 99 mg/dL   BUN 23 (H) 6 - 20  mg/dL   Creatinine, Ser 1.09 0.61 - 1.24 mg/dL   Calcium 8.7 (L) 8.9 - 10.3 mg/dL   GFR calc non Af Amer >60 >60 mL/min   GFR calc Af Amer >60 >60 mL/min    Comment: (NOTE) The eGFR has been calculated using the CKD EPI equation. This calculation has not been validated in all clinical situations. eGFR's persistently <60 mL/min signify possible Chronic Kidney Disease.    Anion gap 12 5 - 15    Comment: Performed at [facility], [address], [place], [place] [address]  Basic metabolic panel     Status: Abnormal   Collection Time: 12/30/17  4:47 AM  Result Value Ref Range   Sodium 136 135 - 145 mmol/L   Potassium 4.3 3.5 - 5.1 mmol/L   Chloride 94 (L) 101 - 111 mmol/L   CO2 28 22 - 32 mmol/L   Glucose, Bld 92 65 - 99 mg/dL   BUN 26 (H) 6 - 20 mg/dL   Creatinine, Ser 1.38 (H) 0.61 - 1.24 mg/dL   Calcium 8.4 (L) 8.9 - 10.3 mg/dL   GFR calc non Af Amer 48 (L) >60 mL/min   GFR calc Af Amer 56 (L) >60  mL/min    Comment: (NOTE) The eGFR has been calculated using the CKD EPI equation. This calculation has not been validated in all clinical situations. eGFR's persistently <60 mL/min signify possible Chronic Kidney Disease.    Anion gap 14 5 - 15    Comment: Performed at [facility] [address] [place], [place] [address]    ABGS No results for input(s): PHART, PO2ART, TCO2, HCO3 in the last 72 hours.  Invalid input(s): PCO2 CULTURES No results found for this or any previous visit (from the past 240 hour(s)). Studies/Results: No results found.  Medications:  Prior to Admission:  Medications Prior to Admission  Medication Sig Dispense Refill Last Dose  . acetaminophen (TYLENOL) 500 MG tablet Take 1,000 mg by mouth every 6 (six) hours as needed for headache.   Past Month at Unknown time  . amoxicillin (AMOXIL) 500 MG capsule Take 500 mg by mouth 3 (three) times daily.  3 Past Week at Unknown time  . aspirin 81 MG chewable tablet Chew 1 tablet (81 mg total) by mouth daily.    Past Week at Unknown time  . atorvastatin (LIPITOR) 40 MG tablet Take 1 tablet (40 mg total) by mouth daily at 6 PM. 90 tablet 3 Past Week at Unknown time  . celecoxib (CELEBREX) 200 MG capsule Take 1 capsule (200 mg total) by mouth 2 (two) times daily as needed for moderate pain.   Past Week at Unknown time  . clonazePAM (KLONOPIN) 1 MG tablet Take 2 tablets (2 mg total) by mouth at bedtime. 60 tablet 2 Past Week at Unknown time  . diclofenac sodium (VOLTAREN) 1 % GEL Apply 4 g topically 4 (four) times daily as needed (for leg soreness). 1 Tube 2 Past Week at Unknown time  . lidocaine-prilocaine (EMLA) cream Apply 1 application topically 2 (two) times daily.  4 Past Week at Unknown time  . Multiple Vitamin (MULTIVITAMIN WITH MINERALS) TABS tablet Take 1 tablet by mouth daily.   Past Week at Unknown time  . nitroGLYCERIN (NITROSTAT) 0.4 MG SL tablet Place 1 tablet (0.4 mg total) under the tongue every 5 (five) minutes as needed for chest pain (Max 3 doses within 15 minutes. Call 911 at 3rd dose).  12 Taking  . oxyCODONE-acetaminophen (PERCOCET) 10-325 MG tablet Take 1 tablet by mouth every 4 (four) hours as needed for pain.    Past Week at Unknown time  . Pumpkin Seed-Soy Germ (AZO BLADDER CONTROL/GO-LESS PO) Take 20 mg by mouth 3 (three) times daily.   Past Week at Unknown time  . rivaroxaban (XARELTO) 20 MG TABS tablet Take 1 tablet (20 mg total) by mouth daily with supper. 30 tablet 3 12/25/2017 at 1900  . rOPINIRole (REQUIP) 1 MG tablet TAKE 2 TABLETS BY MOUTH AT BEDTIME (Patient taking differently: TAKE 2 MG BY MOUTH AT BEDTIME) 60 tablet 0 Past Week at Unknown time  . ROZEREM 8 MG tablet Take 8 mg by mouth at bedtime as needed for sleep.   5 Past Week at Unknown time  . tiZANidine (ZANAFLEX) 4 MG tablet Take 4 mg by mouth 4 (four) times daily as needed for muscle spasms.   12 Past Week at Unknown time  . tobramycin-dexamethasone (TOBRADEX) ophthalmic solution Place 2 drops into both eyes 4 (four)  times daily as needed. (eye burning)  2 Taking  . traZODone (DESYREL) 50 MG tablet Take 50 mg by mouth at bedtime.  5 Past Week at Unknown time  . triamcinolone ointment (KENALOG) 0.1 %  APPLY TO AFFECTED AREA TWICE A DAY 30 g 0 Past Week at Unknown time  . [DISCONTINUED] polyethylene glycol (MIRALAX / GLYCOLAX) packet Take 17 g by mouth daily.   Not Taking at Unknown time   Scheduled: . alfuzosin  10 mg Oral Q breakfast  . aspirin  81 mg Oral Daily  . atorvastatin  40 mg Oral q1800  . clonazePAM  2 mg Oral QHS  . finasteride  5 mg Oral Daily  . polyethylene glycol  17 g Oral Daily  . potassium chloride  20 mEq Oral BID  . rivaroxaban  20 mg Oral Q supper  . rOPINIRole  2 mg Oral QHS  . sodium chloride flush  3 mL Intravenous Q12H  . torsemide  40 mg Oral Daily  . triamcinolone ointment   Topical BID   Continuous:  AVQ:[id number] **OR** acetaminophen, albuterol, diclofenac sodium, nitroGLYCERIN, ondansetron **OR** ondansetron (ZOFRAN) IV, oxyCODONE-acetaminophen **AND** oxyCODONE, tiZANidine  Assesment: He was admitted with acute on chronic heart failure.  He is much better and he is going to be discharged home today. Active Problems:   OSA- on C-pap   RESTLESS LEG SYNDROME   Insomnia   Ischemic cardiomyopathy   Morbid obesity due to excess calories (HCC)   Lumbar foraminal stenosis   Persistent atrial fibrillation (HCC)   Mitral valve insufficiency   Venous insufficiency of both lower extremities   BPH (benign prostatic hyperplasia)   CHF (congestive heart failure) ([place])    Plan: Discharge home today    LOS: 4 days   Mikia Delaluz L 12/30/2017, 9:06 AM

## 2017-12-31 NOTE — Discharge Summary (Signed)
Physician Discharge Summary  Patient ID: [name] [name] MRN: [id number] DOB/AGE: September 01, 1940 77 y.o. Primary Care Physician:No primary care provider on file. 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   alfuzosin 10 MG 24 hr tablet Commonly known as:  UROXATRAL Take 1 tablet (10 mg total) by mouth daily with breakfast.   amoxicillin 500 MG capsule Commonly known as:  AMOXIL Take 500 mg by mouth 3 (three) times daily.   atorvastatin 40 MG tablet Commonly known as:  LIPITOR Take 1 tablet (40 mg total) by mouth daily at 6 PM.   AZO BLADDER CONTROL/GO-LESS PO Take 20 mg by mouth 3 (three) times daily.   clonazePAM 1 MG tablet Commonly known as:  KLONOPIN Take 2 tablets (2 mg total) by mouth at bedtime.   diclofenac sodium 1 % Gel Commonly known as:  VOLTAREN Apply 4 g topically 4 (four) times daily as needed (for leg soreness).   finasteride 5 MG tablet Commonly known as:  PROSCAR Take 1 tablet (5 mg total) by mouth daily.   lidocaine-prilocaine cream Commonly known as:  EMLA Apply 1 application topically 2 (two) times daily.   multivitamin with minerals Tabs tablet Take 1 tablet by  mouth daily.   nitroGLYCERIN 0.4 MG SL tablet Commonly known as:  NITROSTAT Place 1 tablet (0.4 mg total) under the tongue every 5 (five) minutes as needed for chest pain (Max 3 doses within 15 minutes. Call 911 at 3rd dose).   oxyCODONE-acetaminophen 10-325 MG tablet Commonly known as:  PERCOCET Take 1 tablet by mouth every 4 (four) hours as needed for pain.   polyethylene glycol packet Commonly known as:  MIRALAX / GLYCOLAX Take 17 g by mouth daily.   potassium chloride SA 20 MEQ tablet Commonly known as:  K-DUR,KLOR-CON Take 1 tablet (20 mEq total) by mouth 2 (two) times daily.   rivaroxaban 20 MG Tabs tablet Commonly known as:  XARELTO Take 1 tablet (20 mg total) by mouth daily with supper.   rOPINIRole 1 MG tablet Commonly known as:  REQUIP TAKE 2 TABLETS BY MOUTH AT BEDTIME What changed:    how much to take  how to take this  when to take this   ROZEREM 8 MG tablet Generic drug:  ramelteon Take 8 mg by mouth at bedtime as needed for sleep.   tiZANidine 4 MG tablet Commonly known as:  ZANAFLEX Take 4 mg by mouth 4 (four) times daily as needed for muscle spasms.   tobramycin-dexamethasone ophthalmic solution Commonly known as:  TOBRADEX Place 2 drops into both eyes 4 (four) times daily as needed. (eye burning)   torsemide  20 MG tablet Commonly known as:  DEMADEX Take 2 tablets (40 mg total) by mouth daily.   traZODone 50 MG tablet Commonly known as:  DESYREL Take 50 mg by mouth at bedtime.   triamcinolone ointment 0.1 % Commonly known as:  KENALOG APPLY TO AFFECTED AREA TWICE A DAY       Discharged Condition: Improved    Consults: Cardiology/urology  Significant Diagnostic Studies: X-ray Chest Pa And Lateral  Result Date: 12/26/2017 CLINICAL DATA:  CHF history of hypertension EXAM: CHEST - 2 VIEW COMPARISON:  05/13/2017, CT chest 05/16/2017, PET-CT 05/13/2014 FINDINGS: Right-sided central venous port tip overlies the proximal right atrium.  Cardiomegaly with vascular congestion and diffuse interstitial opacity suspicious for pulmonary edema. Small pleural effusions. No pneumothorax. Suspected 2.5 cm left apical lung nodule. IMPRESSION: 1. Cardiomegaly with vascular congestion and mild to moderate pulmonary edema. Small pleural effusion 2. Suspected 2.5 cm left apical lung nodule. Recommend CT chest to further evaluate. Electronically Signed   By: Donavan Foil M.D.   On: 12/26/2017 20:16   Ct Chest W Contrast  Result Date: 12/27/2017 CLINICAL DATA:  Left apical nodule seen on chest x-ray. EXAM: CT CHEST WITH CONTRAST TECHNIQUE: Multidetector CT imaging of the chest was performed during intravenous contrast administration. CONTRAST:  15mL ISOVUE-300 IOPAMIDOL (ISOVUE-300) INJECTION 61% COMPARISON:  Chest x-ray 12/26/2017.  Chest CT 05/16/2017. FINDINGS: Cardiovascular: There is cardiomegaly. Diffuse coronary artery calcifications. Scattered aortic calcifications and tortuosity. No evidence of aortic aneurysm or dissection. Mild vascular congestion. Mediastinum/Nodes: Mildly enlarged left axillary lymph nodes, new since prior study. Index left axillary lymph node has a short axis diameter of 15 mm on image 31. Scattered small mediastinal lymph nodes are stable since prior study. Airways patent. Esophagus grossly unremarkable. Lungs/Pleura: Irregular spiculated nodule in the left upper lobe corresponding to the density seen on chest x-ray measures up to 2.7 cm on image 24. This compares to 2.1 cm on prior CT. Moderate right pleural effusion and small left pleural effusion. Dependent and bibasilar atelectasis. Compressive atelectasis in the right lower lobe. Upper Abdomen: Layering gallstones within the gallbladder. Small amount of perihepatic and perisplenic ascites. Musculoskeletal: Mild edema throughout the chest wall suggesting anasarca. No acute or focal bony abnormality. IMPRESSION: 2.7 cm spiculated nodule in the medial left upper lobe/apex  concerning for lung cancer. This could be further evaluated with PET CT. Cardiomegaly, diffuse coronary artery disease. Mild vascular congestion. Moderate right pleural effusion and small left pleural effusion. Small and borderline sized scattered mediastinal lymph nodes are stable since prior study. Enlarging left axillary lymph nodes. Electronically Signed   By: Rolm Baptise M.D.   On: 12/27/2017 10:37    Lab Results: Basic Metabolic Panel: Recent Labs    12/29/17 0444 12/30/17 0447  NA 136 136  K 4.3 4.3  CL 94* 94*  CO2 30 28  GLUCOSE 95 92  BUN 23* 26*  CREATININE 1.09 1.38*  CALCIUM 8.7* 8.4*   Liver Function Tests: No results for input(s): AST, ALT, ALKPHOS, BILITOT, PROT, ALBUMIN in the last 72 hours.   CBC: No results for input(s): WBC, NEUTROABS, HGB, HCT, MCV, PLT in the last 72 hours.  No results found for this or any previous visit (from the past 240 hour(s)).   Hospital Course: This is a 77 year old who came to my office with severe edema and shortness of breath.  He was admitted from my office.  He initially told me that he was having trouble tolerating his diuretics but did not  tell me that he had stopped taking them.  He is up about 30 pounds from his previous weight.  He is having trouble with incontinence of urine.  He has swelling of both legs.  No chest pain.  He had cardiology consultation and he was started on intravenous diuresis.  He had Foley catheter placed because of intense diuresis and he lost greater than 30 pounds and was down about 20 L by the time of discharge.  Urology was consulted and made recommendations about how to perhaps allow him to tolerate taking diuretics.  At the time of discharge he still had some swelling of his legs but his chest was clear and he felt much better.  Chest x-ray showed what looked like a lung mass and this was confirmed by CT so he will need to have outpatient PET scanning  Discharge Exam: Blood pressure 104/66, pulse 76,  temperature 97.7 F (36.5 C), temperature source Oral, resp. rate 15, height 5\' 11"  (1.803 m), weight 135.4 kg (298 lb 6.4 oz), SpO2 95 %. He is awake and alert.  He is in atrial fib.  He still has edema of his legs but that is much better.  Disposition: Home.  He did not want home health services  Discharge Instructions    AMB Referral to [place] Management   Complete by:  As directed    Please assign patient for [facility] nurse, [facility] Social Worker and [facility] Pharmacist to engage.  Patient states he is not weighing and is unsure how to recognize sodium in foods. Patient admits to medication affordability issues and is interested in mail order pharmacy if possible. Patient also admits to transportation issues related to finances not his ability to drive.    For questions please contact:   Janci Minor RN, [facility] Liaison [phone])   Reason for consult:  post hospital follow up with [place], [facility] SW and [facility] Pharmacist.   Diagnoses of:  Heart Failure   Expected date of contact:  1-3 days (reserved for hospital discharges)      Follow-up Information    Lendon Colonel, NP Follow up on 01/12/2018.   Specialties:  Nurse Practitioner, Radiology, Cardiology Why:  [place] on 01/12/2018 at 11:00AM.  Contact information: [address] [place] [address] [phone]           Signed: Alonza Bogus   12/31/2017, 9:48 AM

## 2018-01-02 ENCOUNTER — Other Ambulatory Visit: Payer: Self-pay

## 2018-01-02 ENCOUNTER — Encounter: Payer: Self-pay | Admitting: *Deleted

## 2018-01-02 ENCOUNTER — Other Ambulatory Visit: Payer: Self-pay | Admitting: *Deleted

## 2018-01-02 NOTE — Patient Outreach (Signed)
[facility]) Care Management  [place]   01/02/2018  [name] [name] 1941/01/21 [id number]  77 year old male referred to [facility].  [place] services requested for medication management  PMHx includes, but not limited to, CAD, MI, ischemic cardiomyopathy, chronic systolic heart failure, h/o DVT, venous insufficiency of both lower extremities, obstructive sleep apnea, benign prostatic hyperplasia and restless leg syndrome.   Subjective: [name] [name] reports that he is feeling "much, much better".  He states that he lost about 30 lbs of fluid last week while hospitalized.  He reports that he has a scale and knows he is supposed to weigh daily, but has not done so since he has been home because he forgets.  He states the doctor told him to call the office if he gains  3 lbs in a day.   He did ask, "How we could tell if the weight gain was from eating too much or fluid?"  He reports that before he went to the hospital that his feet were so swollen that the skin on his ankles was splitting open. Patient reports that he stopped smoking on 09/18/17 because he had a "coughing fit" and that he can tell a difference in the way he feels and the way food taste.   He states that he has a spot on his lung and he thinks it will be cancer.  He reports having cancer and chemo before but doesn't know the type of cancer.   Objective: 12/30/17 SCr 1.38 mg/dL  KCl 4.3 mmol/L EF 40-45% 12/27/17  Current Medications: Current Outpatient Medications  Medication Sig Dispense Refill  . alfuzosin (UROXATRAL) 10 MG 24 hr tablet Take 1 tablet (10 mg total) by mouth daily with breakfast. 30 tablet 12  . atorvastatin (LIPITOR) 40 MG tablet Take 1 tablet (40 mg total) by mouth daily at 6 PM. 90 tablet 3  . clonazePAM (KLONOPIN) 1 MG tablet Take 2 tablets (2 mg total) by mouth at bedtime. 60 tablet 2  . diclofenac sodium (VOLTAREN) 1 % GEL Apply 4 g topically 4 (four) times daily as needed  (for leg soreness). (Patient taking differently: Apply 4 g topically 4 (four) times daily as needed (for leg soreness-mostly uses at bedtime). ) 1 Tube 2  . finasteride (PROSCAR) 5 MG tablet Take 1 tablet (5 mg total) by mouth daily. 30 tablet 12  . lidocaine-prilocaine (EMLA) cream Apply 1 application topically 2 (two) times daily as needed. Applies to ankle area when skin splits from edema.  4  . Multiple Vitamin (MULTIVITAMIN WITH MINERALS) TABS tablet Take 1 tablet by mouth daily.    . nitroGLYCERIN (NITROSTAT) 0.4 MG SL tablet Place 1 tablet (0.4 mg total) under the tongue every 5 (five) minutes as needed for chest pain (Max 3 doses within 15 minutes. Call 911 at 3rd dose). (Patient taking differently: Place 0.4 mg under the tongue every 5 (five) minutes as needed for chest pain (Max 3 doses within 15 minutes. Call 911 at 3rd dose). Uses very infrequently.)  12  . oxyCODONE-acetaminophen (PERCOCET) 10-325 MG tablet Take 1 tablet by mouth every 4 (four) hours as needed for pain.     . polyethylene glycol (MIRALAX / GLYCOLAX) packet Take 17 g by mouth daily. (Patient taking differently: Take 17 g by mouth daily as needed. ) 14 each 0  . potassium chloride SA (K-DUR,KLOR-CON) 20 MEQ tablet Take 1 tablet (20 mEq total) by mouth 2 (two) times daily. 60 tablet  12  . Pumpkin Seed-Soy Germ (AZO BLADDER CONTROL/GO-LESS PO) Take 20 mg by mouth 3 (three) times daily.    . rivaroxaban (XARELTO) 20 MG TABS tablet Take 1 tablet (20 mg total) by mouth daily with supper. 30 tablet 3  . rOPINIRole (REQUIP) 1 MG tablet TAKE 2 TABLETS BY MOUTH AT BEDTIME (Patient taking differently: TAKE 2 MG BY MOUTH AT BEDTIME) 60 tablet 0  . ROZEREM 8 MG tablet Take 8 mg by mouth at bedtime as needed for sleep. Takes every night.  5  . tiZANidine (ZANAFLEX) 4 MG tablet Take 4 mg by mouth 4 (four) times daily as needed for muscle spasms.   12  . torsemide (DEMADEX) 20 MG tablet Take 2 tablets (40 mg total) by mouth daily. 60 tablet  12  . traZODone (DESYREL) 50 MG tablet Take 50 mg by mouth at bedtime.  5  . triamcinolone ointment (KENALOG) 0.1 % APPLY TO AFFECTED AREA TWICE A DAY 30 g 0  . acetaminophen (TYLENOL) 500 MG tablet Take 1,000 mg by mouth every 6 (six) hours as needed for headache.     No current facility-administered medications for this visit.    Plan:Functional Status: In your present state of health, do you have any difficulty performing the following activities: 12/26/2017 05/16/2017  Hearing? N N  Vision? N N  Difficulty concentrating or making decisions? N N  Walking or climbing stairs? Y N  Dressing or bathing? N N  Doing errands, shopping? N N  Some recent data might be hidden    Fall/Depression Screening: Fall Risk  09/17/2014 07/31/2014 06/19/2014  Falls in the past year? No No No   PHQ 2/9 Scores 09/17/2014 06/18/2014  PHQ - 2 Score 0 0    ASSESSMENT: Date Discharged from Hospital: 12/30/17 Date Medication Reconciliation Performed: 01/02/2018  Medications Discontinued at Discharge:   Celebrex  Baby aspirin  New Medications at Discharge:   Torsemide   Potassium Chloride  Finasteride  Alfuzosin  Patient was recently discharged from hospital and all medications have been reviewed.  Drugs sorted by system:  Neurologic/Psychologic: clonazepam, ropinirole, trazodone  Cardiovascular: nitroglycerin, potassium chloride, rivaroxaban, torsemide  Gastrointestinal: polyethylene glycol  Topical: triamcinolone ointment  Pain: acetaminophen, diclofenac sodium gel, lidocaine/prilocaine, oxycodone/APAP  Vitamins/Minerals: MVI  Miscellaneous: alfuzosin, finasteride, pumpkin seed/soy germ, rozerem, tizanidine   Gaps in therapy:  Patient with systolic heart failure with  borderline  EF 40-45% and h/o STEMI, consider adding ACEI and beta blocker.     Medications to avoid in the elderly:  Per the [place] list, TCAs like trazodone have increased risk for falls and fractures.  There is  strong evidence to avoid in the elderly.  Other issues noted:   If prescribed trazodone for sleep, does patient need to be on both trazodone and ramelteon (Rozerem)?  Medication Management/Adherence:  Patient currently receives Full Extra Help LIS.  I asked if he would like to change his pharmacy to mail order as the referral noted and he said he was concerned about the wait time.  I counseled him that he could get a three month supply from mail order, but would have to be diligent about ordering ahead of time to avoid running out of medication before his new supply arrived.  He stated he would like to [facility] with [facility] for convenience.   Patient initially stated he was not taking a statin while doing his medication review.  I verified Atorvastatin was  [facility] at [facility] for a 90 day supply on  12/03/17. I called Mr. Harnisch back to confirm that he was taking Atorvastatin as prescribed.  Patient verified that he was indeed taking.    I counseled Mr. Balcerzak about the importance of including daily weighing into his morning routine.  I asked him to place his scale near his toilet, so he will remember to weigh daily and to record the results to look for trends.  Informed him that rapid weight gain of 3 lbs/day would indicate too much fluid and that overeating would not show up quickly as a day to day change.  I stressed the importance of monitoring daily weighs and notifying the doctor to avoid hospitalizations.   We also discussed the importance of a low sodium diet to prevent fluid accumulation.  PLAN:  Route note to cardiology NP, Jory Sims, whom patient has appointment with next week.   Route note to PCP, [name] questioning if patient needs to continue trazodone.  Follow up with Mr. Wainright Friday to see if he has begun to weight himself daily.   Joetta Manners, PharmD Clinical Pharmacist [place] [phone]

## 2018-01-02 NOTE — Patient Outreach (Signed)
[place]) [place] Telephone Outreach PCP completes transition of care outreach post-hospital discharge Post-hospital discharge day 3  01/02/2018  [name] [name] 12/05/1940 [id number]  Successful telephone outreach to [name] [name], 77 y/o male referred to [place] after recent hospitalization April 29-Dec 30, 2017 for shortness of breath, significant bilateral lower extremity edema, weight gain of 30-plus pounds, CHF exacerbation.  Patient was discharged home to self-care after refusing home health services.  Patient has history including, but not limited to, CAD with previous MI/ stenting; ischemic cardiomyopathy; sCHF; Atrial fibrillation; OSA on CPAP; BPH; and obesity.  HIPAA/ identity verified with patient during phone call today.  [place] services were discussed with patient and patient provided verbal consent for [place] RN CM involvement in his care post-hospital discharge.  Today, patient reports that he is "doing much better" after his recent hospitalization, and he denies pain, recent falls, concerns, or problems.  Patient sounds to be in no distress throughout 25 minute phone call today.  Patient further reports:  Medications: -- Has all medicationsand takes as prescribed;denies questions about current medications, states that he spoke to [facility] Pharmacist earlier today who "cleared everything up" for him around his questions re; newly prescribed medications.  -- Verbalizes good general understanding of the purpose, dosing, and scheduling of medications.   -- self-manage medications using weekly pill planner box. -- denies issues with swallowing medications -- patient was recently discharged from the hospital and all medications were thoroughly reviewed with patient today, by [facility] Pharmacist [name] [name]  Provider appointments: -- Upcoming provider appointment with cardiology provider was confirmed with  patient today -- does not yet have scheduled PCP office visit post-hospital discharge; states that his PCP informed him that his staff would call him to make this appointment; patient states he is waiting to hear from PCP staff before he calls; encouraged patient to promptly call PCP if he has not heard from the office staff by Wednesday of this week, and patient agrees to do so-- discussed rationale for prompt PCP evaluation post-hospital discharge  Safety/ Mobility/ Falls: -- denies any falls over last year, states that he "has never fallen." -- assistive devices: endorses that he has a cane if he needs it, but states that he "rarely uses" as he is "steady on his feet." -- general fall risks/ prevention education discussed with patient today  Social/ Community Resource needs: -- currently denies community resource needs, stating supportive family members that assist with care needs as indicated -- has 2 daughters; one daughter "[name] [name]" lives with patient and assists with his care as indicated -- patient provides self- transportation for all provider appointments, errands, etc; confirms that he continues to drive  Advanced Directive (AD) Planning:   --reports does not currently have exisisting AD in place.   -- discussed basics of AD planning with patient, who agrees to discuss further at time of [facility] initial home visit  Self-health management of chronic disease state of CHF: -- verbalizes fair understanding of pathophysiology of chronic CHF -- does not currently monitor or record daily weights; states he "has just been lazy," but verbalizes plans to begin this practice "starting tomorrow;" stating that he will have his daughter assist him with setting up his scales tonight.  Encouraged patient to begin this practice immediately and discussed rationale and weight gain guidelines in setting of CHF.  Agrees to begin monitoring and recording daily weights for review at time of [place] RN CM  initial home visit -- "tries to eat healthy," agrees that he could use further instruction around dietary choices, reading salt labels, etc -- initiated education around CHF zones; patient open to further discussion at time of [facility] RN CCM initial home visit -- denies current SOB since hospital discharge; no home O2; uses CPAP at night "every night" for OSA  Patient denies further issues, concerns, or problems today.  I attempted to provide patient my direct phone number, the main [facility] CM office phone number, and the [facility] CM 24-hour nurse advice phone number should issues arise prior to next scheduled [place] outreach, however, patient states he is unable to take this information as he has nothing to write with.  Confirmed with patient that he has my phone number on his recent call roster on his cell phone, and encouraged him to contact me directly if needs, questions, issues, or concerns arise prior to next scheduled outreach; patient agreed to do so.  We scheduled [facility] CM initial home visit for next week.  Plan:  Patient will take medications as prescribed and will attend all scheduled provider appointments post-hospital discharge  Patient will promptly schedule PCP office visit for post-hospital discharge evaluation and medication review  Patient will promptly notify care providers for any new concerns/ issues/ problems that arise post-hospital discharge  Patient will begin monitoring and recording daily weights  [facility] CM outreach to continue with scheduled initial home visit next week  [facility] CM Care Plan Problem One     Most Recent Value  Care Plan Problem One  Risk for hospital readmission, as evidenced by recent hospitalization April 29-Dec 30, 2017 for CHF exacerbation  Role Documenting the Problem One  Care Management Coordinator  Care Plan for Problem One  Active  [facility] Long Term Goal   Over the next 31 days, patient will not experience hospital readmission, as  evidenced by patient reporting and review of EMR during [place] RN CM outreach  [facility] Long Term Goal Start Date  01/02/18  Interventions for Problem One Long Term Goal  Discussed with patient his understanding of recent hospitalization,  role/ services of [facility] RN CM,  his conversation today with [facility] Pharmacist along with medication changes post-hospital discharge  [facility] CM Short Term Goal #1   Over the next 10 days, patient will schedule appointment with PCP for post-hospital discharge follow up, as evidenced by patient reporting during [place] outreach  [facility] CM Short Term Goal #1 Start Date  01/02/18  Interventions for Short Term Goal #1  Discussed with patient rationale for promptly attending post-hospital discharge PCP appointment and encouraged patient to schedule this appointment promptly    [facility] CM Care Plan Problem Two     Most Recent Value  Care Plan Problem Two  Knoweldge deficit around self-health management of chronic disease state of CHF, as evidenced by patient reporting of same  Role Documenting the Problem Two  Care Management Coordinator  Care Plan for Problem Two  Active  Interventions for Problem Two Long Term Goal   Discussed with patient his current understanding of CHF pathophysiology, and initiated verbal education around self-health management of CHF,  scheduled [facility] RN CCM initial home visit with patient for next week  [facility] Long Term Goal  Over the next 60 days, patient will verbalize signs and symptoms of yellow zone along with corresponding action plan, as evidenced by patient reporting during [place] outreach  [facility] Long Term Goal Start Date  01/02/18  [facility] CM Short Term Goal #1   Over the next 30 days, patient will monitor and record daily weights, as evidenced by review of same and patient reporting during [place] outreach  [facility] CM Short Term Goal #1 Start Date  01/02/18  Interventions for Short Term Goal #2   Discussed rationale for daily weight monitoring  in setting of CHF with patient, and encouraged patient to begin daily weight monitoring and recording immediately,  discussed weight gain guidelines to report to PCP weight gain of 3 lbs overnight/ 5 lbs in one week      I appreciate the opportunity to participate in [name] [name]'s care,  Oneta Rack, RN, BSN, [employer] Coordinator [facility] Care Management  [phone]

## 2018-01-03 DIAGNOSIS — R918 Other nonspecific abnormal finding of lung field: Secondary | ICD-10-CM | POA: Diagnosis not present

## 2018-01-03 DIAGNOSIS — I5042 Chronic combined systolic (congestive) and diastolic (congestive) heart failure: Secondary | ICD-10-CM | POA: Diagnosis not present

## 2018-01-03 DIAGNOSIS — J449 Chronic obstructive pulmonary disease, unspecified: Secondary | ICD-10-CM | POA: Diagnosis not present

## 2018-01-03 DIAGNOSIS — I2510 Atherosclerotic heart disease of native coronary artery without angina pectoris: Secondary | ICD-10-CM | POA: Diagnosis not present

## 2018-01-04 ENCOUNTER — Other Ambulatory Visit (HOSPITAL_COMMUNITY): Payer: Self-pay | Admitting: Pulmonary Disease

## 2018-01-04 DIAGNOSIS — R918 Other nonspecific abnormal finding of lung field: Secondary | ICD-10-CM

## 2018-01-06 ENCOUNTER — Other Ambulatory Visit: Payer: Self-pay

## 2018-01-06 NOTE — Patient Outreach (Signed)
[place] [facility]) Care Management  01/06/2018  [name] [name] [birth year] [id number]  77 year old male referred to [facility].  [place] services requested for medication management  PMHx includes, but not limited to, CAD, MI, ischemic cardiomyopathy, chronic systolic heart failure, h/o DVT, venous insufficiency of both lower extremities, obstructive sleep apnea, benign prostatic hyperplasia and restless leg syndrome.   Successful morning outreach attempt to [name] [name] and  HIPAA identifiers verified.  Follow up phone call placed this afteroon was unsuccessful.  Left HIPAA compliant voice message asking for return call.   Subjective: [name] [name] reports that he is feeling well.   He states that the scale he borrowed from a friend "has it's moments."  He was able to weigh himself a couple of days ago and reports a weight of 296lbs.  He has not been able to get the scale to work since then, despite changing the battery.  He reports that he does not have money to buy a new scale.   Patient reports that he has paper near his scale to record  his daily weights.  Assesement: Reviewed with patient that he initially weighed 330 lbs last time he went to the hospital and was down to 298 lbs around discharge.  I have sent a request for [facility] to purchase [name] [name] a new digital scale.  It will be shipped directly to his house.  I reviewed the importance of checking his weight daily, recording it and notifying the doctor if he sees an increase of >3 lbs/[name] (as patient states he was instructed) to help prevent future hospitalizations.  Patient verbalized understanding.   Medication Management: Call placed to Dr. Kathaleen Grinder office this am to clarify if patient is to take rozerem and trazodone concurrently for sleep.  Received call back from office, and [name] only wants him to be on one medication for sleep.  He will let the patient decide if he thinks one is more effective than the  other.  Dr. Kathaleen Grinder office is having computer issues and closes at noon, so staff verbalized that they will outreach to [name] [name] on Monday to determine which sleeping medication he would like to discontinue.  I was unable to contact [name] [name] to notify him of Dr Kathaleen Grinder plan.  Plan: Follow up with patient Monday am to let him know that [facility] will be sending him a scale.  Inform him to expect a call from Dr. Kathaleen Grinder office to discuss his medications for sleep.    Joetta Manners, PharmD Clinical Pharmacist [place] [phone]

## 2018-01-09 ENCOUNTER — Other Ambulatory Visit: Payer: Self-pay

## 2018-01-09 ENCOUNTER — Ambulatory Visit (HOSPITAL_COMMUNITY)
Admission: RE | Admit: 2018-01-09 | Discharge: 2018-01-09 | Disposition: A | Discharge status: Home / Self Care | Payer: Medicare Other | Source: Ambulatory Visit | Attending: Pulmonary Disease | Admitting: Pulmonary Disease

## 2018-01-09 ENCOUNTER — Ambulatory Visit: Payer: Self-pay

## 2018-01-09 DIAGNOSIS — R918 Other nonspecific abnormal finding of lung field: Secondary | ICD-10-CM | POA: Diagnosis not present

## 2018-01-09 MED ORDER — FLUDEOXYGLUCOSE F - 18 (FDG) INJECTION
10.0000 | Freq: Once | INTRAVENOUS | Status: AC | PRN
  Administered 2018-01-09: 10.44 via INTRAVENOUS

## 2018-01-09 NOTE — Patient Outreach (Signed)
[facility]) Care Management  01/09/2018  [name] [name] Jun 07, 1941 [id number]  77year old malereferred to [place] [facility].[place] services requested for medication managementPMHx includes, but not limited to, CAD, MI, ischemic cardiomyopathy, chronic systolic heart failure, h/o DVT, venous insufficiencyofboth lower extremities, obstructive sleep apnea, benign prostatic hyperplasiaandrestless leg syndrome.  Successful outreach call placed to [name] [name].  HIPAA identifiers verified.   Informed patient that I have ordered him a digital scale that will be mailed to his house.  He states he got his working and his weight is stable at 295.5 lbs.   Medication Management:  Per note on 01/06/18, [name] does not want the patient to be on both rozerem and trazodone for sleep, but wants to know which one [name] [name] feels works better?  Patient reports that he feels the trazodone is more beneficial than the rozerem and he prefers to continue trazodone.  He also takes clonazepam and feels that works the best, especially when combined with trazodone.  Patient states he got a tamsulosin prescription from [name] this weekend and the bottle said if was for fluid.  He was concerned about adding another "fluid pill."   I educated [name] [name] about tamsulosin and how it helps with urine retention.    Plan: Call PCP, [name] to inform him of patient's comments about sleeping medications and see which one the doctor wants the patient to continue.  I will request that doctor's office reach out to [name] [name] to inform him of the doctor's decision.    Joetta Manners, PharmD Clinical Pharmacist [place] [phone]  Addendum: Spoke with staff at Dr. Kathaleen Grinder office.  She will pass along the information that [name] [name] prefers clonazepam and trazodone for sleep.  She will call patient to inform him of Dr. Kathaleen Grinder recommendation.  She  says she does not see a record of a new tamsulosin prescription for this patient.  She will clarify if he is to continue.  She reports he was on it while in the hospital in April.   Plan: Follow up [name] plan regarding tamsulosin and sleeping medications.   Joetta Manners, PharmD Clinical Pharmacist [place] [phone]

## 2018-01-10 ENCOUNTER — Other Ambulatory Visit: Payer: Self-pay

## 2018-01-10 NOTE — Patient Outreach (Signed)
[facility]) Care Management  01/10/2018  [name] [name] [birth year] [id number]  Incoming call received from [place] at [name] office on 01/09/18 at 1755.   [name] wants to discontinue the Rozerem for sleep. Patient to continue clonazepam and trazodone for sleep.  Also, discontinue the tamsulosin for BPH and continue the alfuzosin and finasteride.    Outreach call placed to [name] [name].  HIPAA identifiers verified.  [name] [name] reports he had received a call from Dr. Kathaleen Grinder office and he was able to verbalize the medication changes documented above.  Plan: Follow up in one week to see if [name] has received his new digital scale.  Verify that patient is taking daily weights.   Joetta Manners, PharmD Clinical Pharmacist [place] [phone]

## 2018-01-11 ENCOUNTER — Ambulatory Visit: Payer: Medicare Other

## 2018-01-11 ENCOUNTER — Encounter: Payer: Self-pay | Admitting: Adult Health

## 2018-01-11 ENCOUNTER — Encounter: Payer: Self-pay | Admitting: *Deleted

## 2018-01-11 ENCOUNTER — Other Ambulatory Visit: Payer: Self-pay | Admitting: *Deleted

## 2018-01-11 NOTE — Patient Outreach (Signed)
[place] [facility]) Care Management [place] Initial Home Visit- Patient NO SHOW Post-hospital discharge day 13 01/11/2018  [name] [name] Jun 06, 1941 [id number]  Unsuccessful outreach attempt to [name] [name], 77 y/o male referred to [place] after recent hospitalization April 29-Dec 30, 2017 for shortness of breath, significant bilateral lower extremity edema, weight gain of 30-plus pounds, CHF exacerbation.  Patient was discharged home to self-care after refusing home health services.  Patient has history including, but not limited to, CAD with previous MI/ stenting; ischemic cardiomyopathy; sCHF; Atrial fibrillation; OSA on CPAP; BPH; and obesity.    When I arrived at patient's home for previously scheduled home visit, no one appeared to be at the residence; I knocked on both visible/ accessible doors to residence without response.  I attempted to contact patient by telephone, and left him a HIPAA compliant voice message, requesting call back.  After waiting for 20 minutes at patient's home, I left educational materials, Advanced Directive planning packet, and my business card in his front door entry way, requesting call back to re-schedule today's home visit.  I then contacted Joetta Manners, [facility] Pharmacist, and made her aware that patient was not present at his home for previously scheduled home visit.  Plan:  Will send patient unsuccessful outreach letter today and re-attempt outreach by telephone within 3-4 business days  Oneta Rack, RN, BSN, [place] Care Management  [phone]

## 2018-01-12 ENCOUNTER — Ambulatory Visit: Payer: Medicare Other | Admitting: Adult Health

## 2018-01-12 ENCOUNTER — Other Ambulatory Visit (HOSPITAL_COMMUNITY): Payer: Self-pay | Admitting: Pulmonary Disease

## 2018-01-12 DIAGNOSIS — R911 Solitary pulmonary nodule: Secondary | ICD-10-CM

## 2018-01-12 DIAGNOSIS — R0989 Other specified symptoms and signs involving the circulatory and respiratory systems: Secondary | ICD-10-CM

## 2018-01-12 NOTE — Progress Notes (Deleted)
Cardiology Office Note   Date:  01/12/2018   ID:  [name] [name], DOB [birth year], MRN [id number]  PCP:  System, Pcp Not In  Cardiologist:  Dr. Debara Pickett   No chief complaint on file.    History of Present Illness: [name] [name] is a 77 y.o. male who presents for post hospitalization follow up, after admission for acute on chronic combined CHF, with known hx of CAD S/P STEMI on 09/2014 with DES to the LAD),, HTN, PAF on Xarelto, and moderate to severe MR, with other hx to include COPD, and OSA on CPAP. He apparently had only been taking diuretic as directed, not taking for approximately one month, Weight increased from 301-330. He was diuresed with IV lasix. He was transitioned to torsemide 40 mg daily. Weight 298 lbs on discharge.     He was noted to have a lung mass per CT of the chest which will be followed as an OP with PET scan,     Past Medical History:  Diagnosis Date  . Anxiety    situational  . Bilateral lower extremity edema   . CAD (coronary artery disease) 10/23/2014   a. s/p STEMI in 09/2014 with DES to LAD  . Cancer (HCC)    Mantle cell lymphoma  . Dyspnea    with exertion  . ED (erectile dysfunction)   . Hyperlipidemia   . Hypertension   . Insomnia   . Ischemic cardiomyopathy 10/24/14   EF 40-45% by echo  . Obesity    morbid  . OSA (obstructive sleep apnea)    severe, on CPAP 12  . Osteoarthritis   . Peripheral edema   . Persistent atrial fibrillation ([place]) 05/16/2017  . Restless leg syndrome   . Rhinitis   . Tobacco abuse     Past Surgical History:  Procedure Laterality Date  . ANTERIOR LAT LUMBAR FUSION N/A 07/02/2016   Procedure: LUMBAR THREE - FOUR ANTERIOR LATERAL LUMBAR FUSION WITH PERCUTANEOUS SCREWS LEFT;  Surgeon: Earnie Larsson, MD;  Location: [facility];  Service: Neurosurgery;  Laterality: N/A;  . BACK SURGERY  10/2009  . CORONARY ANGIOPLASTY    . EYE SURGERY     lazer bil  . HEMORRHOID SURGERY    . INTRA-AORTIC BALLOON PUMP INSERTION  10/23/2014     Procedure: INTRA-AORTIC BALLOON PUMP INSERTION;  Surgeon: Peter M Martinique, MD;  Location: [facility] CATH LAB;  Service: Cardiovascular;;  . LEFT HEART CATHETERIZATION WITH CORONARY ANGIOGRAM N/A 10/23/2014   Procedure: LEFT HEART CATHETERIZATION WITH CORONARY ANGIOGRAM;  Surgeon: Peter M Martinique, MD;  Location: [facility] CATH LAB;  Service: Cardiovascular;  Laterality: N/A;  . LYMPH NODE BIOPSY Right 01/2014   groin area  . PERCUTANEOUS CORONARY STENT INTERVENTION (PCI-S)  10/23/2014   Procedure: PERCUTANEOUS CORONARY STENT INTERVENTION (PCI-S);  Surgeon: Peter M Martinique, MD;  Location: [facility] CATH LAB;  Service: Cardiovascular;;  . PORTACATH PLACEMENT Right 2015  . ROTATOR CUFF REPAIR  2011  . TOTAL KNEE ARTHROPLASTY     left  . TOTAL KNEE ARTHROPLASTY  08/04/2012   right knee  . TOTAL KNEE ARTHROPLASTY  08/04/2012   Procedure: TOTAL KNEE ARTHROPLASTY;  Surgeon: Yvette Rack., MD;  Location: [facility];  Service: Orthopedics;  Laterality: Right;  WITH PATELLA RESURFACING     Current Outpatient Medications  Medication Sig Dispense Refill  . acetaminophen (TYLENOL) 500 MG tablet Take 1,000 mg by mouth every 6 (six) hours as needed for headache.    . alfuzosin (UROXATRAL) 10 MG 24  hr tablet Take 1 tablet (10 mg total) by mouth daily with breakfast. 30 tablet 12  . atorvastatin (LIPITOR) 40 MG tablet Take 1 tablet (40 mg total) by mouth daily at 6 PM. 90 tablet 3  . clonazePAM (KLONOPIN) 1 MG tablet Take 2 tablets (2 mg total) by mouth at bedtime. 60 tablet 2  . diclofenac sodium (VOLTAREN) 1 % GEL Apply 4 g topically 4 (four) times daily as needed (for leg soreness). (Patient taking differently: Apply 4 g topically 4 (four) times daily as needed (for leg soreness-mostly uses at bedtime). ) 1 Tube 2  . finasteride (PROSCAR) 5 MG tablet Take 1 tablet (5 mg total) by mouth daily. 30 tablet 12  . lidocaine-prilocaine (EMLA) cream Apply 1 application topically 2 (two) times daily as needed. Applies to ankle area when skin  splits from edema.  4  . Multiple Vitamin (MULTIVITAMIN WITH MINERALS) TABS tablet Take 1 tablet by mouth daily.    . nitroGLYCERIN (NITROSTAT) 0.4 MG SL tablet Place 1 tablet (0.4 mg total) under the tongue every 5 (five) minutes as needed for chest pain (Max 3 doses within 15 minutes. Call 911 at 3rd dose). (Patient taking differently: Place 0.4 mg under the tongue every 5 (five) minutes as needed for chest pain (Max 3 doses within 15 minutes. Call 911 at 3rd dose). Uses very infrequently.)  12  . oxyCODONE-acetaminophen (PERCOCET) 10-325 MG tablet Take 1 tablet by mouth every 4 (four) hours as needed for pain.     . polyethylene glycol (MIRALAX / GLYCOLAX) packet Take 17 g by mouth daily. (Patient taking differently: Take 17 g by mouth daily as needed. ) 14 each 0  . potassium chloride SA (K-DUR,KLOR-CON) 20 MEQ tablet Take 1 tablet (20 mEq total) by mouth 2 (two) times daily. 60 tablet 12  . Pumpkin Seed-Soy Germ (AZO BLADDER CONTROL/GO-LESS PO) Take 20 mg by mouth 3 (three) times daily.    . rivaroxaban (XARELTO) 20 MG TABS tablet Take 1 tablet (20 mg total) by mouth daily with supper. 30 tablet 3  . rOPINIRole (REQUIP) 1 MG tablet TAKE 2 TABLETS BY MOUTH AT BEDTIME (Patient taking differently: TAKE 2 MG BY MOUTH AT BEDTIME) 60 tablet 0  . tiZANidine (ZANAFLEX) 4 MG tablet Take 4 mg by mouth 4 (four) times daily as needed for muscle spasms.   12  . torsemide (DEMADEX) 20 MG tablet Take 2 tablets (40 mg total) by mouth daily. 60 tablet 12  . traZODone (DESYREL) 50 MG tablet Take 50 mg by mouth at bedtime.  5  . triamcinolone ointment (KENALOG) 0.1 % APPLY TO AFFECTED AREA TWICE A DAY 30 g 0   No current facility-administered medications for this visit.     Allergies:   Patient has no known allergies.    Social History:  The patient  reports that he has been smoking cigarettes.  He has a 5.50 pack-year smoking history. He has never used smokeless tobacco. He reports that he does not drink  alcohol or use drugs.   Family History:  The patient's family history includes Early death in his father; Unexplained death (age of onset: 40) in his mother.    ROS: All other systems are reviewed and negative. Unless otherwise mentioned in H&P    PHYSICAL EXAM: VS:  There were no vitals taken for this visit. , BMI There is no height or weight on file to calculate BMI. GEN: Well nourished, well developed, in no acute distress  HEENT: normal  Neck: no JVD, carotid bruits, or masses Cardiac: ***RRR; no murmurs, rubs, or gallops,no edema  Respiratory:  clear to auscultation bilaterally, normal work of breathing GI: soft, nontender, nondistended, + BS MS: no deformity or atrophy  Skin: warm and dry, no rash Neuro:  Strength and sensation are intact Psych: euthymic mood, full affect   EKG:  EKG {ACTION; IS/IS [id number]} ordered today. The ekg ordered today demonstrates ***   Recent Labs: 12/26/2017: ALT 21; B Natriuretic Peptide 1,808.0; Hemoglobin 13.2; Magnesium 2.0; Platelets 180; TSH 1.378 12/30/2017: BUN 26; Creatinine, Ser 1.38; Potassium 4.3; Sodium 136    Lipid Panel    Component Value Date/Time   CHOL 127 05/17/2017 0507   TRIG 38 05/17/2017 0507   HDL 27 (L) 05/17/2017 0507   CHOLHDL 4.7 05/17/2017 0507   VLDL 8 05/17/2017 0507   LDLCALC 92 05/17/2017 0507      Wt Readings from Last 3 Encounters:  12/28/17 298 lb 6.4 oz (135.4 kg)  10/19/17 (!) 301 lb 12.8 oz (136.9 kg)  10/10/17 (!) 315 lb 3.2 oz (143 kg)      Other studies Reviewed: Echocardiogram: 12/27/2017 Study Conclusions  - Left ventricle: The cavity size was normal. Wall thickness was increased in a pattern of mild LVH. Systolic function was mildly to moderately reduced. The estimated ejection fraction was in the range of 40% to 45%. The study was not technically sufficient to allow evaluation of LV diastolic dysfunction due to atrial fibrillation. Doppler parameters are consistent  with high ventricular filling pressure. - Regional wall motion abnormality: Hypokinesis of the mid inferior and mid inferolateral myocardium; mild hypokinesis of the apical anterior myocardium. - Aortic valve: Mildly thickened, mildly calcified leaflets. - Mitral valve: Moderate eccentric regurgitation. Degree of severity may be underestimated due to eccentricity of jet. - Left atrium: The atrium was severely dilated. - Right ventricle: The cavity size was mildly dilated. Systolic function was moderately reduced. - Right atrium: The atrium was moderately dilated. - Tricuspid valve: There was mild regurgitation. - Inferior vena cava: The vessel was dilated. The respirophasic diameter changes were blunted (<50%), consistent with elevated central venous pressure. Estimated CVP 15 mmHg.  CT Scan of the Chest 12/27/2017  IMPRESSION: 2.7 cm spiculated nodule in the medial left upper lobe/apex concerning for lung cancer. This could be further evaluated with PET CT.  Cardiomegaly, diffuse coronary artery disease. Mild vascular congestion.  Moderate right pleural effusion and small left pleural effusion.  Small and borderline sized scattered mediastinal lymph nodes are stable since prior study. Enlarging left axillary lymph nodes.  ASSESSMENT AND PLAN:  1.  ***   Current medicines are reviewed at length with the patient today.    Labs/ tests ordered today include: *** Phill Myron. [place] Pugh, ANP, AACC   01/12/2018 7:52 AM    [facility] [address] [place], [place] [address] Phone: [phone]; Fax: [phone]

## 2018-01-13 ENCOUNTER — Encounter: Payer: Self-pay | Admitting: Adult Health

## 2018-01-13 ENCOUNTER — Other Ambulatory Visit: Payer: Self-pay | Admitting: *Deleted

## 2018-01-13 ENCOUNTER — Encounter: Payer: Self-pay | Admitting: *Deleted

## 2018-01-13 NOTE — Patient Outreach (Signed)
[facility]) [place] Telephone Outreach PCP completes transition of care outreach post-hospital discharge Post-hospital discharge day 14 01/13/2018  [name] [name] Dec 30, 1940 [id number]  Initially unsuccessful telephone outreach to [name], 77 y/o male referred to [place] after recent hospitalization April 29-Dec 30, 2017 for shortness of breath, significant bilateral lower extremity edema, weight gain of 30-plus pounds, CHF exacerbation.  Patient was discharged home to self-care after refusing home health services.  Patient has history including, but not limited to, CAD with previous MI/ stenting; ischemic cardiomyopathy; sCHF; Atrial fibrillation; OSA on CPAP; BPH; and obesity.    Patient left me a voice mail message on afternoon of Jan 12, 2018 regarding no-show initial home visit which was attempted as previously scheduled on Jan 11, 2018.  Today's phone call was in response to patient's voice mail message from yesterday afternoon, in an effort to re-schedule this week's unsuccessful attempted home visit.  HIPAA compliant voice mail message left for patient, requesting return call back.  Patient immediately returned my call, and HIPAA/ identity was verified with patient.  Today, patient reports that he "is doing pretty good," and he denies pain/ new or recent falls, and sounds to be in no obvious/ apparent distress throughout entirety of phone cal today.   Patient further reports:  Medications: -- Has all medicationsand takes as prescribed;denies questions/ concerns around current medications, states that he "likes new fluid pill" much better than "the old one," stating that he does not have as much urgency/ frequency with "new" diuretic -- denies new or recent changes to medications post-hospital discharge -- confirms that he continues working with [facility] Pharmacist Joetta Manners.   Provider appointments: -- previously  scheduled provider appointment with cardiology provider was cancelled per patient report today -- has still not yet scheduled PCP office visit post-hospital discharge; again states that his PCP informed him that his staff would call him to make this appointment; and he states that he "hasn't heard anything back yet."  Reports that he attended scheduled CT scan on on Monday 01/09/18 to evaluate a "skin issue," and that he expects to hear from PCP "soon."  I again encouraged patient to promptly call PCP if he has not heard from the office staff by morning of Monday 01/16/18, and patient agrees to do so-- reiterated with patient rationale for prompt PCP evaluation post-hospital discharge  Self-health management of chronic disease state of CHF: -- reports that he "just received new scales in mail today;" verbalizes plans to begin monitoring/ recording daily weights; states he will have his daughter assist him in setting up scales and calibrating.  Again encouraged patient to begin this practice immediately and discussed rationale and weight gain guidelines in setting of CHF.  Agrees to begin monitoring and recording daily weights for review at time of [place] RN CM initial home visit, which we re-scheduled today for next week. -- states that he believes his breathing "is fine," and he denies shortness of breath at rest/ concerns around breathing; reports continues using CPAP at night "every night." -- confirmed that patient received printed educational material I left at his home at the time of missed [facility] CCM RN initial home visit earlier this week-- encouraged patient read and review this information prior to re-scheduled [facility] RN CCM initial home visit next week; patient verbalizes agreement and understanding and states he will do.  Patient denies further issues, concerns, or problems today. I confirmed that patient has my direct phone  number, the main [facility] CM office phone number, and the [facility] CM 24-hour  nurse advice line phone number should issues arise prior to next scheduled [place] outreach.  Encouraged him to contact me directly if needs, questions, issues, or concerns arise prior to next scheduled outreach; patient agreed to do so.  We re-scheduled/ confirmed [facility] CM initial home visit for next week.  Plan:  Patient will take medications as prescribed and will attend all scheduled provider appointments post-hospital discharge  Patient will promptly schedule PCP office visit for post-hospital discharge evaluation and medication review  Patient will promptly notify care providers for any new concerns/ issues/ problems that arise post-hospital discharge  Patient will begin monitoring and recording daily weights  [facility] CM outreach to continue with re-scheduled initial home visit next week  [facility] CM Care Plan Problem One     Most Recent Value  Care Plan Problem One  Risk for hospital readmission, as evidenced by recent hospitalization April 29-Dec 30, 2017 for CHF exacerbation  Role Documenting the Problem One  Care Management Coordinator  Care Plan for Problem One  Active  [facility] Long Term Goal   Over the next 31 days, patient will not experience hospital readmission, as evidenced by patient reporting and review of EMR during [place] RN CM outreach  [facility] Long Term Goal Start Date  01/02/18  Interventions for Problem One Long Term Goal  Discussed with patient current clinical status/ medications post-hospital discharge,  re-scheduled [facility] CM initial home visit, which was previously unsuccessful earlier this week,  re-iterated patient's rights/ responsibilities   [facility] CM Short Term Goal #1   Over the next 7 days, patient will schedule appointment with PCP for post-hospital discharge follow up, as evidenced by patient reporting during [facility] RN CCM outreach [Goal modified today]  [facility] CM Short Term Goal #1 Start Date  01/13/18 [Goal re-established today]   Interventions for Short Term Goal #1  Discussed with patient his efforts to schedule post-hospital PCP office visit,  encouraged patient to schedule this appointment promptly    [facility] CM Care Plan Problem Two     Most Recent Value  Care Plan Problem Two  Knoweldge deficit around self-health management of chronic disease state of CHF, as evidenced by patient reporting of same  Role Documenting the Problem Two  Care Management Coordinator  Care Plan for Problem Two  Active  Interventions for Problem Two Long Term Goal   Confirmed that patient received printed EMMI educational material that was left at his home at the time of missed [facility] CCM initial home visit earlier this week,  encouraged patient to review this material prior to re-scheduled initial home visit next week, and encouraged patient to write down any questions he has, if any  [facility] Long Term Goal  Over the next 60 days, patient will verbalize signs and symptoms of yellow zone along with corresponding action plan, as evidenced by patient reporting during [facility] RN CCM outreach  [facility] Long Term Goal Start Date  01/02/18  [facility] CM Short Term Goal #1   Over the next 30 days, patient will monitor and record daily weights, as evidenced by review of same and patient reporting during [place] CCM outreach  [facility] CM Short Term Goal #1 Start Date  01/02/18  Interventions for Short Term Goal #2   Confirmed that patient has received new scales and encouraged him to begin monitoring and recording daily weights,  re-iterated weight gain guidelines in setting  of CHF to report any weight gain > 3 lbs overnight/ 5 lbs in one week to care providers     Oneta Rack, RN, BSN, [facility]  [phone]

## 2018-01-16 ENCOUNTER — Other Ambulatory Visit: Payer: Self-pay | Admitting: *Deleted

## 2018-01-16 ENCOUNTER — Ambulatory Visit: Payer: Medicare Other | Admitting: *Deleted

## 2018-01-16 ENCOUNTER — Encounter: Payer: Self-pay | Admitting: *Deleted

## 2018-01-16 DIAGNOSIS — J449 Chronic obstructive pulmonary disease, unspecified: Secondary | ICD-10-CM | POA: Diagnosis not present

## 2018-01-16 DIAGNOSIS — N401 Enlarged prostate with lower urinary tract symptoms: Secondary | ICD-10-CM | POA: Diagnosis not present

## 2018-01-16 DIAGNOSIS — I2510 Atherosclerotic heart disease of native coronary artery without angina pectoris: Secondary | ICD-10-CM | POA: Diagnosis not present

## 2018-01-16 DIAGNOSIS — I509 Heart failure, unspecified: Secondary | ICD-10-CM | POA: Diagnosis not present

## 2018-01-16 NOTE — Patient Outreach (Signed)
[name] Telephone Outreach PCP completes transition of care post-hospital discharge Post-hospital discharge day 17  01/16/2018  [name] [name] 02-27-1941 [id number]  Received voice mail message from patient, requesting call back; returned patient's call within 20 minutes of receiving his voice message.  Successful telephone outreach to [name] [name], 77 y/o male referred to [place] afterrecent hospitalization April 29-Dec 30, 2017 for shortness of breath, significant bilateral lower extremity edema, weight gain of 30-plus pounds, CHF exacerbation. Patientwas discharged home to self-care after refusing home health services. Patient has history including, but not limited to, CAD with previous MI/ stenting; ischemic cardiomyopathy; sCHF; Atrial fibrillation; OSA on CPAP; BPH; and obesity. HIPAA/ identity verified with patient during phone call today.  Today, patient reports that he "is not doing very good," and he states that he went to visit his PCP, [name], today.  Reports that he 'thought" he might be "running into trouble," as the new scales he obtained through [facility] CM indicated that he has "been steadily gaining weight;" reports that he started monitoring and recording daily weights as soon as he obtained the scales, and that he "gained 6 lbs overnight last night."  Reports weight yesterday of 312 lbs, with weight today of 318 lbs.  Patient states he feels "winded" and is taking "it easy;" patient sounds to be in no obvious or apparent distress throughout phone call today.  Reports that he had scheduled and attended PCP office visit this morning; stated that he was told by his doctor that if he "did not significantly improve in 2 days," his PCP was "going to re-hospitalize" him.  Reports that PCP "took off the new fluid pill, (torsemide) and" replaced with "old fluid pill, Lasix at 40 mg" po QD.  States that his PCP  advised him not to start Lasix until tomorrow morning, as he had already taken fluid pill this morning (torsemide).  States that his PCP told him that his "heart is so bad," he "needs a heart transplant, but he doesn't think I would survive the procedure because my heart is so weak."  States that he is scheduled to return to PCP in 2 days, on Wednesday May 22, and he wanted to inform me in case her was re-hospitalized at that time, as we had a [facility] CCM initial home visit scheduled for later this week.  Discussed with patient option to contact PCP to see if approval could be obtained for patient to go ahead and start newly prescribed lasix this afternoon, but patient declines my doing so, and stated that he "doesn't want to run into even more trouble" from taking "too many fluid pills;" states that PCP specifically told him to start lasix "tomorrow" and that he will consider contacting the office staff himself this afternoon to inquire about possibility of starting this evening.  Self-health management ofchronic disease state of CHF: -- provided positive reinforcement for patient monitoring/ recording daily weights promptly after receiving new scales; encouraged him to continue this practice and to make it a part of his daily routine; patient agrees to do so, and weight gain guidelines in setting of CHF was re-iterated with patient.  -- continues using CPAP at night "every night" -- signs/ symptoms that would necessitate patient contacting EMS/ going emergently to ED were discussed with him, and he verbalizes a good understanding of same   Patient denies further issues, concerns, or problems today. Iconfirmed thatpatient has my direct phone number, the main  [place] CM office phone number, and the [facility] CM 24-hour nurse advice line phone number should issues arise prior to next scheduled [place] outreach.  Encouraged himto contact me directly if needs, questions, issues, or concerns arise prior to  next scheduled outreach; patient agreed to do so.We confirmed [place] initial home visit for later this week, unless patient is re-admitted to the hospital after upcoming PCP follow up office visit.  Plan:  Patient will take medications as prescribed and will attend all scheduled provider appointments post-hospital discharge  Patient will promptly schedule PCP office visit for post-hospital discharge evaluation and medication review  Patient will promptly notify care providers for any new concerns/ issues/ problems that arise post-hospital discharge  Patient will begin monitoring and recording daily weights  [facility] CM outreach to continue with re-scheduled initial home later this week  [facility] CM Care Plan Problem One     Most Recent Value  Care Plan Problem One  Risk for hospital readmission, as evidenced by recent hospitalization April 29-Dec 30, 2017 for CHF exacerbation  Role Documenting the Problem One  Care Management Coordinator  Care Plan for Problem One  Active  [facility] Long Term Goal   Over the next 31 days, patient will not experience hospital readmission, as evidenced by patient reporting and review of EMR during [place] RN CM outreach  [facility] Long Term Goal Start Date  01/02/18  Interventions for Problem One Long Term Goal  Discussed with patient his current clinical status,  today's earlier office visit with PCP today, medication changes made as a result of today's office visit, and PCP plan of care with next scheduled PCP appointment later this week  [facility] CM Short Term Goal #1   Over the next 7 days, patient will schedule appointment with PCP for post-hospital discharge follow up, as evidenced by patient reporting during [place] outreach   [facility] CM Short Term Goal #1 Start Date  01/13/18   [name] CM Short Term Goal #1 Met Date  01/16/18- Goal met  Interventions for Short Term Goal #1  Confirmed that patient attended post-hospital PCP office visit today and has  another follow up visit on wednesday 01/18/18    [facility] CM Care Plan Problem Two     Most Recent Value  Care Plan Problem Two  Knoweldge deficit around self-health management of chronic disease state of CHF, as evidenced by patient reporting of same  Role Documenting the Problem Two  Care Management Coordinator  Care Plan for Problem Two  Active  Interventions for Problem Two Long Term Goal   Discussed with patient current clinical conditon, use of CPAP, signs/ symptoms that would necessitate patient calling EMS/ going to ED  [facility] Long Term Goal  Over the next 60 days, patient will verbalize signs and symptoms of yellow zone along with corresponding action plan, as evidenced by patient reporting during [place] CCM outreach  [facility] Long Term Goal Start Date  01/02/18  [facility] CM Short Term Goal #1   Over the next 30 days, patient will monitor and record daily weights, as evidenced by review of same and patient reporting during [place] CCM outreach  [facility] CM Short Term Goal #1 Start Date  01/02/18  Interventions for Short Term Goal #2   Confirmed that patient obtained and set up newly acquired scales,  discussed/ reviewed recently recorded daily weights,  provided positive reinforcement for starting daily weights, and re-iterated weight gain guidelines in setting of CHF-  encouraged patient to continue monitoring and recording daily weights as a part of his routine at home     Oneta Rack, [place], BSN, [facility] Care Management  [phone]

## 2018-01-18 ENCOUNTER — Other Ambulatory Visit: Payer: Self-pay

## 2018-01-18 ENCOUNTER — Inpatient Hospital Stay (HOSPITAL_COMMUNITY)
Admission: AD | Admit: 2018-01-18 | Discharge: 2018-01-24 | DRG: 291 | Disposition: A | Discharge status: Home / Self Care | Payer: Medicare Other | Source: Ambulatory Visit | Attending: Pulmonary Disease | Admitting: Pulmonary Disease

## 2018-01-18 ENCOUNTER — Encounter (HOSPITAL_COMMUNITY): Payer: Self-pay | Admitting: Emergency Medicine

## 2018-01-18 ENCOUNTER — Ambulatory Visit: Payer: Self-pay

## 2018-01-18 ENCOUNTER — Inpatient Hospital Stay (HOSPITAL_COMMUNITY): Payer: Medicare Other

## 2018-01-18 DIAGNOSIS — G2581 Restless legs syndrome: Secondary | ICD-10-CM | POA: Diagnosis not present

## 2018-01-18 DIAGNOSIS — F419 Anxiety disorder, unspecified: Secondary | ICD-10-CM | POA: Diagnosis present

## 2018-01-18 DIAGNOSIS — I110 Hypertensive heart disease with heart failure: Secondary | ICD-10-CM | POA: Diagnosis not present

## 2018-01-18 DIAGNOSIS — Z96653 Presence of artificial knee joint, bilateral: Secondary | ICD-10-CM | POA: Diagnosis not present

## 2018-01-18 DIAGNOSIS — R918 Other nonspecific abnormal finding of lung field: Secondary | ICD-10-CM | POA: Diagnosis not present

## 2018-01-18 DIAGNOSIS — I2510 Atherosclerotic heart disease of native coronary artery without angina pectoris: Secondary | ICD-10-CM | POA: Diagnosis present

## 2018-01-18 DIAGNOSIS — G4733 Obstructive sleep apnea (adult) (pediatric): Secondary | ICD-10-CM | POA: Diagnosis not present

## 2018-01-18 DIAGNOSIS — Z6841 Body Mass Index (BMI) 40.0 and over, adult: Secondary | ICD-10-CM | POA: Diagnosis not present

## 2018-01-18 DIAGNOSIS — I509 Heart failure, unspecified: Secondary | ICD-10-CM | POA: Diagnosis not present

## 2018-01-18 DIAGNOSIS — G8929 Other chronic pain: Secondary | ICD-10-CM | POA: Diagnosis not present

## 2018-01-18 DIAGNOSIS — F5104 Psychophysiologic insomnia: Secondary | ICD-10-CM | POA: Diagnosis present

## 2018-01-18 DIAGNOSIS — I255 Ischemic cardiomyopathy: Secondary | ICD-10-CM | POA: Diagnosis present

## 2018-01-18 DIAGNOSIS — Z981 Arthrodesis status: Secondary | ICD-10-CM | POA: Diagnosis not present

## 2018-01-18 DIAGNOSIS — N400 Enlarged prostate without lower urinary tract symptoms: Secondary | ICD-10-CM | POA: Diagnosis present

## 2018-01-18 DIAGNOSIS — I481 Persistent atrial fibrillation: Secondary | ICD-10-CM | POA: Diagnosis present

## 2018-01-18 DIAGNOSIS — Z7901 Long term (current) use of anticoagulants: Secondary | ICD-10-CM | POA: Diagnosis not present

## 2018-01-18 DIAGNOSIS — E785 Hyperlipidemia, unspecified: Secondary | ICD-10-CM | POA: Diagnosis not present

## 2018-01-18 DIAGNOSIS — Z955 Presence of coronary angioplasty implant and graft: Secondary | ICD-10-CM | POA: Diagnosis not present

## 2018-01-18 DIAGNOSIS — Z8572 Personal history of non-Hodgkin lymphomas: Secondary | ICD-10-CM

## 2018-01-18 DIAGNOSIS — J449 Chronic obstructive pulmonary disease, unspecified: Secondary | ICD-10-CM | POA: Diagnosis present

## 2018-01-18 DIAGNOSIS — Z79899 Other long term (current) drug therapy: Secondary | ICD-10-CM

## 2018-01-18 DIAGNOSIS — I4819 Other persistent atrial fibrillation: Secondary | ICD-10-CM | POA: Diagnosis present

## 2018-01-18 DIAGNOSIS — I4891 Unspecified atrial fibrillation: Secondary | ICD-10-CM | POA: Diagnosis not present

## 2018-01-18 DIAGNOSIS — I5020 Unspecified systolic (congestive) heart failure: Secondary | ICD-10-CM | POA: Diagnosis not present

## 2018-01-18 DIAGNOSIS — M549 Dorsalgia, unspecified: Secondary | ICD-10-CM | POA: Diagnosis present

## 2018-01-18 DIAGNOSIS — I5021 Acute systolic (congestive) heart failure: Secondary | ICD-10-CM | POA: Diagnosis not present

## 2018-01-18 DIAGNOSIS — F17210 Nicotine dependence, cigarettes, uncomplicated: Secondary | ICD-10-CM | POA: Diagnosis present

## 2018-01-18 LAB — CBC WITH DIFFERENTIAL/PLATELET
BASOS PCT: 1 %
Basophils Absolute: 0 10*3/uL (ref 0.0–0.1)
EOS ABS: 0.1 10*3/uL (ref 0.0–0.7)
Eosinophils Relative: 2 %
HEMATOCRIT: 36.2 % — AB (ref 39.0–52.0)
HEMOGLOBIN: 11.4 g/dL — AB (ref 13.0–17.0)
LYMPHS ABS: 0.5 10*3/uL — AB (ref 0.7–4.0)
Lymphocytes Relative: 11 %
MCH: 31.1 pg (ref 26.0–34.0)
MCHC: 31.5 g/dL (ref 30.0–36.0)
MCV: 98.6 fL (ref 78.0–100.0)
MONO ABS: 0.2 10*3/uL (ref 0.1–1.0)
Monocytes Relative: 6 %
NEUTROS ABS: 3.4 10*3/uL (ref 1.7–7.7)
NEUTROS PCT: 80 %
Platelets: 129 10*3/uL — ABNORMAL LOW (ref 150–400)
RBC: 3.67 MIL/uL — ABNORMAL LOW (ref 4.22–5.81)
RDW: 18.2 % — AB (ref 11.5–15.5)
WBC: 4.1 10*3/uL (ref 4.0–10.5)

## 2018-01-18 LAB — MAGNESIUM: Magnesium: 1.8 mg/dL (ref 1.7–2.4)

## 2018-01-18 LAB — COMPREHENSIVE METABOLIC PANEL
ALK PHOS: 135 U/L — AB (ref 38–126)
ALT: 17 U/L (ref 17–63)
AST: 18 U/L (ref 15–41)
Albumin: 4 g/dL (ref 3.5–5.0)
Anion gap: 8 (ref 5–15)
BUN: 23 mg/dL — ABNORMAL HIGH (ref 6–20)
CALCIUM: 8.9 mg/dL (ref 8.9–10.3)
CHLORIDE: 103 mmol/L (ref 101–111)
CO2: 26 mmol/L (ref 22–32)
CREATININE: 1.13 mg/dL (ref 0.61–1.24)
GFR calc non Af Amer: >60 mL/min (ref >60)
GLUCOSE: 126 mg/dL — AB (ref 65–99)
Potassium: 5.4 mmol/L — ABNORMAL HIGH (ref 3.5–5.1)
Sodium: 137 mmol/L (ref 135–145)
Total Bilirubin: 2.7 mg/dL — ABNORMAL HIGH (ref 0.3–1.2)
Total Protein: 7 g/dL (ref 6.5–8.1)

## 2018-01-18 LAB — BRAIN NATRIURETIC PEPTIDE: B Natriuretic Peptide: 1353 pg/mL — ABNORMAL HIGH (ref 0.0–100.0)

## 2018-01-18 LAB — TSH: TSH: 1.607 u[IU]/mL (ref 0.350–4.500)

## 2018-01-18 MED ORDER — RIVAROXABAN 20 MG PO TABS
20.0000 mg | ORAL_TABLET | Freq: Every day | ORAL | Status: DC
  Administered 2018-01-18 – 2018-01-23 (×6): 20 mg via ORAL
  Filled 2018-01-18 (×6): qty 1

## 2018-01-18 MED ORDER — ATORVASTATIN CALCIUM 40 MG PO TABS
40.0000 mg | ORAL_TABLET | Freq: Every day | ORAL | Status: DC
  Administered 2018-01-18 – 2018-01-23 (×6): 40 mg via ORAL
  Filled 2018-01-18 (×6): qty 1

## 2018-01-18 MED ORDER — TAMSULOSIN HCL 0.4 MG PO CAPS
0.8000 mg | ORAL_CAPSULE | Freq: Every day | ORAL | Status: DC
  Administered 2018-01-18 – 2018-01-23 (×6): 0.8 mg via ORAL
  Filled 2018-01-18 (×6): qty 2

## 2018-01-18 MED ORDER — OXYCODONE-ACETAMINOPHEN 5-325 MG PO TABS
1.0000 | ORAL_TABLET | ORAL | Status: DC | PRN
  Administered 2018-01-19 – 2018-01-24 (×9): 1 via ORAL
  Filled 2018-01-18 (×9): qty 1

## 2018-01-18 MED ORDER — NITROGLYCERIN 0.4 MG SL SUBL: 0.4000 mg | SUBLINGUAL_TABLET | SUBLINGUAL | Status: DC | PRN

## 2018-01-18 MED ORDER — POLYETHYLENE GLYCOL 3350 17 G PO PACK: 17.0000 g | PACK | Freq: Every day | ORAL | Status: DC | PRN

## 2018-01-18 MED ORDER — OXYCODONE-ACETAMINOPHEN 10-325 MG PO TABS: 1.0000 | ORAL_TABLET | ORAL | Status: DC | PRN

## 2018-01-18 MED ORDER — LIDOCAINE-PRILOCAINE 2.5-2.5 % EX CREA
1.0000 "application " | TOPICAL_CREAM | Freq: Two times a day (BID) | CUTANEOUS | Status: DC | PRN
  Administered 2018-01-20: 1 via TOPICAL
  Filled 2018-01-18: qty 5

## 2018-01-18 MED ORDER — FUROSEMIDE 10 MG/ML IJ SOLN
80.0000 mg | Freq: Two times a day (BID) | INTRAMUSCULAR | Status: DC
  Administered 2018-01-18 – 2018-01-24 (×12): 80 mg via INTRAVENOUS
  Filled 2018-01-18 (×12): qty 8

## 2018-01-18 MED ORDER — TRAZODONE HCL 50 MG PO TABS
50.0000 mg | ORAL_TABLET | Freq: Every day | ORAL | Status: DC
  Administered 2018-01-18 – 2018-01-23 (×6): 50 mg via ORAL
  Filled 2018-01-18 (×6): qty 1

## 2018-01-18 MED ORDER — TIZANIDINE HCL 4 MG PO TABS: 4.0000 mg | ORAL_TABLET | Freq: Four times a day (QID) | ORAL | Status: DC | PRN

## 2018-01-18 MED ORDER — ROPINIROLE HCL 1 MG PO TABS
2.0000 mg | ORAL_TABLET | Freq: Every day | ORAL | Status: DC
  Administered 2018-01-18 – 2018-01-23 (×6): 2 mg via ORAL
  Filled 2018-01-18 (×6): qty 2

## 2018-01-18 MED ORDER — FINASTERIDE 5 MG PO TABS
5.0000 mg | ORAL_TABLET | Freq: Every day | ORAL | Status: DC
  Administered 2018-01-18 – 2018-01-24 (×7): 5 mg via ORAL
  Filled 2018-01-18 (×8): qty 1

## 2018-01-18 MED ORDER — OXYCODONE HCL 5 MG PO TABS
5.0000 mg | ORAL_TABLET | ORAL | Status: DC | PRN
  Administered 2018-01-19 – 2018-01-24 (×11): 5 mg via ORAL
  Filled 2018-01-18 (×11): qty 1

## 2018-01-18 MED ORDER — SODIUM CHLORIDE 0.9% FLUSH
3.0000 mL | Freq: Two times a day (BID) | INTRAVENOUS | Status: DC
  Administered 2018-01-18 – 2018-01-23 (×12): 3 mL via INTRAVENOUS

## 2018-01-18 MED ORDER — POTASSIUM CHLORIDE CRYS ER 20 MEQ PO TBCR
20.0000 meq | EXTENDED_RELEASE_TABLET | Freq: Two times a day (BID) | ORAL | Status: DC
  Administered 2018-01-18 – 2018-01-24 (×13): 20 meq via ORAL
  Filled 2018-01-18 (×13): qty 1

## 2018-01-18 MED ORDER — DICLOFENAC SODIUM 1 % TD GEL
4.0000 g | Freq: Four times a day (QID) | TRANSDERMAL | Status: DC | PRN
  Administered 2018-01-20 – 2018-01-23 (×5): 4 g via TOPICAL
  Filled 2018-01-18: qty 100

## 2018-01-18 MED ORDER — TRIAMCINOLONE ACETONIDE 0.1 % EX OINT
TOPICAL_OINTMENT | Freq: Two times a day (BID) | CUTANEOUS | Status: DC
  Administered 2018-01-18 – 2018-01-23 (×12): via TOPICAL
  Filled 2018-01-18: qty 15

## 2018-01-18 MED ORDER — CLONAZEPAM 0.5 MG PO TABS
2.0000 mg | ORAL_TABLET | Freq: Every day | ORAL | Status: DC
  Administered 2018-01-18 – 2018-01-23 (×6): 2 mg via ORAL
  Filled 2018-01-18 (×6): qty 4

## 2018-01-18 MED ORDER — ACETAMINOPHEN 500 MG PO TABS: 1000.0000 mg | ORAL_TABLET | Freq: Four times a day (QID) | ORAL | Status: DC | PRN

## 2018-01-18 NOTE — Patient Outreach (Addendum)
[facility]) Care Management  01/18/2018  [name] [name] 1940-11-23 [id number]  77year old malereferred to [place] Management.[place] services requested for medication managementPMHx includes, but not limited to, CAD, MI, ischemic cardiomyopathy, chronic systolic heart failure, h/o DVT, venous insufficiencyofboth lower extremities, obstructive sleep apnea, benign prostatic hyperplasiaandrestless leg syndrome.  Successful outreach attempt to [name].  HIPAA identifiers verified.  Subjective: Patient states that he is at the doctor's office in the waiting room for his 0930 appointment.  He states that he is not feeling better and is in obvious distress throughout our conversation.  He states that he did start back on lasix yesterday morning as directed by [name].   He reports that he weighed himself this morning but "can't remember what it was."  He states that he believes he will be admitted to the hospital today.    Plan: Check this afternoon to see if he was admitted.   If not, will outreach to patient Friday morning.   Joetta Manners, PharmD Clinical Pharmacist [place] [phone]

## 2018-01-18 NOTE — H&P (Signed)
He was admitted from my office with exacerbation of CHF. He has gained > 20 pounds and is more short of breath. For IV diuresis. Full note to follow

## 2018-01-19 ENCOUNTER — Other Ambulatory Visit: Payer: Self-pay | Admitting: *Deleted

## 2018-01-19 ENCOUNTER — Ambulatory Visit: Payer: Self-pay | Admitting: *Deleted

## 2018-01-19 LAB — BASIC METABOLIC PANEL
Anion gap: 10 (ref 5–15)
BUN: 22 mg/dL — ABNORMAL HIGH (ref 6–20)
CHLORIDE: 99 mmol/L — AB (ref 101–111)
CO2: 27 mmol/L (ref 22–32)
CREATININE: 1.08 mg/dL (ref 0.61–1.24)
Calcium: 8.7 mg/dL — ABNORMAL LOW (ref 8.9–10.3)
GFR calc non Af Amer: >60 mL/min (ref >60)
GLUCOSE: 99 mg/dL (ref 65–99)
Potassium: 4 mmol/L (ref 3.5–5.1)
SODIUM: 136 mmol/L (ref 135–145)

## 2018-01-19 NOTE — H&P (Signed)
[name] [name] MRN: [id number] DOB/AGE: [birth year] 77 y.o. Primary Care Physician:System, Pcp Not In Admit date: 01/18/2018 Chief Complaint: Shortness of breath HPI: This is documentation of history and physical done in my office on 01/18/2018.  I had seen him in the office on 01/16/2018 and he was clearly having reaccumulation of fluid.  He felt that Lasix worked better than torsemide so I switched him back to Lasix but then it appeared that the torsemide actually worked better.  He was up about 25 pounds short of breath so I just went ahead and put him back into the hospital.  He has not had any chest pain.  He does have orthopnea and PND.  He has an extensive swelling of both legs.  He has shortness of breath from COPD as well.  He has a mass lesion in his left lung and he has an appointment to go to the thoracic oncology clinic next week.  He does have problems with BPH and symptoms from that.  He has chronic back pain on medication.  He has chronic insomnia on medication  Past Medical History:  Diagnosis Date  . Anxiety    situational  . Bilateral lower extremity edema   . CAD (coronary artery disease) 10/23/2014   a. s/p STEMI in 09/2014 with DES to LAD  . Cancer (HCC)    Mantle cell lymphoma  . Dyspnea    with exertion  . ED (erectile dysfunction)   . Hyperlipidemia   . Hypertension   . Insomnia   . Ischemic cardiomyopathy 10/24/14   EF 40-45% by echo  . Obesity    morbid  . OSA (obstructive sleep apnea)    severe, on CPAP 12  . Osteoarthritis   . Peripheral edema   . Persistent atrial fibrillation ([place]) 05/16/2017  . Restless leg syndrome   . Rhinitis   . Tobacco abuse    Past Surgical History:  Procedure Laterality Date  . ANTERIOR LAT LUMBAR FUSION N/A 07/02/2016   Procedure: LUMBAR THREE - FOUR ANTERIOR LATERAL LUMBAR FUSION WITH PERCUTANEOUS SCREWS LEFT;  Surgeon: Earnie Larsson, MD;  Location: [facility];  Service: Neurosurgery;  Laterality: N/A;  . BACK SURGERY  10/2009  .  CORONARY ANGIOPLASTY    . EYE SURGERY     lazer bil  . HEMORRHOID SURGERY    . INTRA-AORTIC BALLOON PUMP INSERTION  10/23/2014   Procedure: INTRA-AORTIC BALLOON PUMP INSERTION;  Surgeon: Peter M Martinique, MD;  Location: [facility] CATH LAB;  Service: Cardiovascular;;  . LEFT HEART CATHETERIZATION WITH CORONARY ANGIOGRAM N/A 10/23/2014   Procedure: LEFT HEART CATHETERIZATION WITH CORONARY ANGIOGRAM;  Surgeon: Peter M Martinique, MD;  Location[facility] CATH LAB;  Service: Cardiovascular;  Laterality: N/A;  . LYMPH NODE BIOPSY Right 01/2014   groin area  . PERCUTANEOUS CORONARY STENT INTERVENTION (PCI-S)  10/23/2014   Procedure: PERCUTANEOUS CORONARY STENT INTERVENTION (PCI-S);  Surgeon: Peter M Martinique, MD;  Location: [facility] CATH LAB;  Service: Cardiovascular;;  . PORTACATH PLACEMENT Right 2015  . ROTATOR CUFF REPAIR  2011  . TOTAL KNEE ARTHROPLASTY     left  . TOTAL KNEE ARTHROPLASTY  08/04/2012   right knee  . TOTAL KNEE ARTHROPLASTY  08/04/2012   Procedure: TOTAL KNEE ARTHROPLASTY;  Surgeon: Yvette Rack., MD;  Location: [facility];  Service: Orthopedics;  Laterality: Right;  WITH PATELLA RESURFACING        Family History  Problem Relation Age of Onset  . Unexplained death Mother 45  Considered natural causes, no autopsy  . Early death Father        Killed In World War II  . Coronary artery disease Neg Hx     Social History:  reports that he has been smoking cigarettes.  He has a 5.50 pack-year smoking history. He has never used smokeless tobacco. He reports that he does not drink alcohol or use drugs.   Allergies: No Known Allergies  Medications Prior to Admission  Medication Sig Dispense Refill  . acetaminophen (TYLENOL) 500 MG tablet Take 1,000 mg by mouth every 6 (six) hours as needed for headache.    . alfuzosin (UROXATRAL) 10 MG 24 hr tablet Take 1 tablet (10 mg total) by mouth daily with breakfast. 30 tablet 12  . atorvastatin (LIPITOR) 40 MG tablet Take 1 tablet (40 mg total) by mouth daily  at 6 PM. 90 tablet 3  . clonazePAM (KLONOPIN) 1 MG tablet Take 2 tablets (2 mg total) by mouth at bedtime. 60 tablet 2  . diclofenac sodium (VOLTAREN) 1 % GEL Apply 4 g topically 4 (four) times daily as needed (for leg soreness). (Patient taking differently: Apply 4 g topically 4 (four) times daily as needed (for leg soreness-mostly uses at bedtime). ) 1 Tube 2  . finasteride (PROSCAR) 5 MG tablet Take 1 tablet (5 mg total) by mouth daily. 30 tablet 12  . lidocaine-prilocaine (EMLA) cream Apply 1 application topically 2 (two) times daily as needed. Applies to ankle area when skin splits from edema.  4  . Multiple Vitamin (MULTIVITAMIN WITH MINERALS) TABS tablet Take 1 tablet by mouth daily.    . nitroGLYCERIN (NITROSTAT) 0.4 MG SL tablet Place 1 tablet (0.4 mg total) under the tongue every 5 (five) minutes as needed for chest pain (Max 3 doses within 15 minutes. Call 911 at 3rd dose). (Patient taking differently: Place 0.4 mg under the tongue every 5 (five) minutes as needed for chest pain (Max 3 doses within 15 minutes. Call 911 at 3rd dose). Uses very infrequently.)  12  . oxyCODONE-acetaminophen (PERCOCET) 10-325 MG tablet Take 1 tablet by mouth every 4 (four) hours as needed for pain.     . polyethylene glycol (MIRALAX / GLYCOLAX) packet Take 17 g by mouth daily. (Patient taking differently: Take 17 g by mouth daily as needed. ) 14 each 0  . potassium chloride SA (K-DUR,KLOR-CON) 20 MEQ tablet Take 1 tablet (20 mEq total) by mouth 2 (two) times daily. 60 tablet 12  . Pumpkin Seed-Soy Germ (AZO BLADDER CONTROL/GO-LESS PO) Take 20 mg by mouth 3 (three) times daily.    . rivaroxaban (XARELTO) 20 MG TABS tablet Take 1 tablet (20 mg total) by mouth daily with supper. 30 tablet 3  . rOPINIRole (REQUIP) 1 MG tablet TAKE 2 TABLETS BY MOUTH AT BEDTIME (Patient taking differently: TAKE 2 MG BY MOUTH AT BEDTIME) 60 tablet 0  . tiZANidine (ZANAFLEX) 4 MG tablet Take 4 mg by mouth 4 (four) times daily as needed  for muscle spasms.   12  . torsemide (DEMADEX) 20 MG tablet Take 2 tablets (40 mg total) by mouth daily. 60 tablet 12  . traZODone (DESYREL) 50 MG tablet Take 50 mg by mouth at bedtime.  5  . triamcinolone ointment (KENALOG) 0.1 % APPLY TO AFFECTED AREA TWICE A DAY 30 g 0       JYN:WGNFA from the symptoms mentioned above,there are no other symptoms referable to all systems reviewed.  10 point review of systems otherwise is  negative  Physical Exam: Blood pressure 102/78, pulse 87, temperature (!) 97.5 F (36.4 C), temperature source Oral, resp. rate (!) 22, height 5\' 11"  (1.803 m), weight (!) 147.9 kg (326 lb 1 oz), SpO2 92 %. Constitutional: He is an obese male in mild acute distress.  Is eyes: Pupils react EOMI.  Ears nose mouth and throat: His hearing is grossly normal.  Throat is clear.  Neck is supple.  Pulmonary: He has rales bilaterally.  Cardiovascular: He is in atrial fib with a systolic murmur and S3 gallop.  Gastrointestinal: His abdomen is soft obese with no masses.  Musculoskeletal: His strength is normal bilaterally neurological: No focal abnormalities.  Skin: He has chronic venous stasis changes more on the right calf than the left.  Psychiatric: Normal mood and affect   Recent Labs    01/18/18 1238  WBC 4.1  NEUTROABS 3.4  HGB 11.4*  HCT 36.2*  MCV 98.6  PLT 129*   Recent Labs    01/18/18 1238 01/19/18 0624  NA 137 136  K 5.4* 4.0  CL 103 99*  CO2 26 27  GLUCOSE 126* 99  BUN 23* 22*  CREATININE 1.13 1.08  CALCIUM 8.9 8.7*  MG 1.8  --   lablast2(ast:2,ALT:2,alkphos:2,bilitot:2,prot:2,albumin:2)@    No results found for this or any previous visit (from the past 240 hour(s)).   X-ray Chest Pa And Lateral  Result Date: 01/18/2018 CLINICAL DATA:  CHF EXAM: CHEST - 2 VIEW COMPARISON:  12/26/2017 FINDINGS: Cardiomegaly with vascular congestion. Diffuse interstitial prominence again noted compatible with edema. Rounded density in the left upper lobe again noted,  stable. Small effusions. No acute bony abnormality. Right Port-A-Cath is unchanged. IMPRESSION: Cardiomegaly with vascular congestion and mild to moderate pulmonary edema. Small bilateral effusions. Left upper lobe nodule again noted, stable. No change since prior study. Electronically Signed   By: Rolm Baptise M.D.   On: 01/18/2018 15:02   X-ray Chest Pa And Lateral  Result Date: 12/26/2017 CLINICAL DATA:  CHF history of hypertension EXAM: CHEST - 2 VIEW COMPARISON:  05/13/2017, CT chest 05/16/2017, PET-CT 05/13/2014 FINDINGS: Right-sided central venous port tip overlies the proximal right atrium. Cardiomegaly with vascular congestion and diffuse interstitial opacity suspicious for pulmonary edema. Small pleural effusions. No pneumothorax. Suspected 2.5 cm left apical lung nodule. IMPRESSION: 1. Cardiomegaly with vascular congestion and mild to moderate pulmonary edema. Small pleural effusion 2. Suspected 2.5 cm left apical lung nodule. Recommend CT chest to further evaluate. Electronically Signed   By: Donavan Foil M.D.   On: 12/26/2017 20:16   Ct Chest W Contrast  Result Date: 12/27/2017 CLINICAL DATA:  Left apical nodule seen on chest x-ray. EXAM: CT CHEST WITH CONTRAST TECHNIQUE: Multidetector CT imaging of the chest was performed during intravenous contrast administration. CONTRAST:  32mL ISOVUE-300 IOPAMIDOL (ISOVUE-300) INJECTION 61% COMPARISON:  Chest x-ray 12/26/2017.  Chest CT 05/16/2017. FINDINGS: Cardiovascular: There is cardiomegaly. Diffuse coronary artery calcifications. Scattered aortic calcifications and tortuosity. No evidence of aortic aneurysm or dissection. Mild vascular congestion. Mediastinum/Nodes: Mildly enlarged left axillary lymph nodes, new since prior study. Index left axillary lymph node has a short axis diameter of 15 mm on image 31. Scattered small mediastinal lymph nodes are stable since prior study. Airways patent. Esophagus grossly unremarkable. Lungs/Pleura: Irregular  spiculated nodule in the left upper lobe corresponding to the density seen on chest x-ray measures up to 2.7 cm on image 24. This compares to 2.1 cm on prior CT. Moderate right pleural effusion and small left pleural  effusion. Dependent and bibasilar atelectasis. Compressive atelectasis in the right lower lobe. Upper Abdomen: Layering gallstones within the gallbladder. Small amount of perihepatic and perisplenic ascites. Musculoskeletal: Mild edema throughout the chest wall suggesting anasarca. No acute or focal bony abnormality. IMPRESSION: 2.7 cm spiculated nodule in the medial left upper lobe/apex concerning for lung cancer. This could be further evaluated with PET CT. Cardiomegaly, diffuse coronary artery disease. Mild vascular congestion. Moderate right pleural effusion and small left pleural effusion. Small and borderline sized scattered mediastinal lymph nodes are stable since prior study. Enlarging left axillary lymph nodes. Electronically Signed   By: Rolm Baptise M.D.   On: 12/27/2017 10:37   Nm Pet Image Initial (pi) Skull Base To Thigh  Result Date: 01/10/2018 CLINICAL DATA:  Initial treatment strategy for left lung mass. EXAM: NUCLEAR MEDICINE PET SKULL BASE TO THIGH TECHNIQUE: 10.44 mCi F-18 FDG was injected intravenously. Full-ring PET imaging was performed from the skull base to thigh after the radiotracer. CT data was obtained and used for attenuation correction and anatomic localization. Fasting blood glucose: 90 mg/dl COMPARISON:  CT chest dated 12/27/2017 FINDINGS: Mediastinal blood pool activity: SUV max 2.1 NECK: No hypermetabolic cervical lymphadenopathy. Incidental CT findings: none CHEST: 3.0 x 2.3 cm mass in the medial left lung apex, max SUV 8.7, compatible with primary bronchogenic neoplasm. No hypermetabolic thoracic lymphadenopathy. Incidental CT findings: Cardiomegaly with mild interstitial edema. Small right and trace left pleural effusions. Atherosclerotic calcifications of the  aortic arch. Three vessel coronary atherosclerosis. Right chest port terminating at the cavoatrial junction. ABDOMEN/PELVIS: No abnormal hypermetabolism in the liver, spleen, pancreas, or adrenal glands. No hypermetabolic abdominopelvic lymphadenopathy. Incidental CT findings: Mildly nodular hepatic contour. Cholelithiasis. Mild mesenteric fluid/edema with trace perihepatic ascites. Atherosclerotic calcifications the abdominal aorta and branch vessels. Small fat containing periumbilical hernia. Body wall edema. SKELETON: No focal hypermetabolic activity to suggest skeletal metastasis. Incidental CT findings: Degenerative changes the visualized thoracolumbar spine. IMPRESSION: 3.0 cm mass in the medial left lung apex, compatible with primary bronchogenic neoplasm. No findings suspicious for metastatic disease. Electronically Signed   By: Julian Hy M.D.   On: 01/10/2018 09:14   Impression: He was admitted with congestive heart failure.  This is recurrent.  He has chronic atrial fib  He has COPD which is about the same  He has BPH on medication  He has a lung mass and was not felt to be a good candidate for transthoracic biopsy of that Active Problems:   CHF (congestive heart failure) ([place])     Plan: He will start IV diuresis.  Continue other treatments.      Davante Gerke L   01/19/2018, 8:50 AM

## 2018-01-19 NOTE — Progress Notes (Signed)
Subjective: He feels much better except he is having some anxiety.  His breathing is better.  No other new complaints.  No chest pain.  Objective: Vital signs in last 24 hours: Temp:  [97.5 F (36.4 C)-98.4 F (36.9 C)] 97.5 F (36.4 C) (05/23 0440) Pulse Rate:  [87-142] 87 (05/23 0440) Resp:  [22] 22 (05/22 1210) BP: (102-113)/(78-102) 102/78 (05/23 0440) SpO2:  [76 %-97 %] 92 % (05/23 0440) Weight:  [147.9 kg (326 lb 1 oz)] 147.9 kg (326 lb 1 oz) (05/22 1210) Weight change:  Last BM Date: 01/17/18  Intake/Output from previous day: 05/22 0701 - 05/23 0700 In: 3 [I.V.:3] Out: 6425 [Urine:6425]  PHYSICAL EXAM General appearance: alert, cooperative and no distress Resp: rales bibasilar Cardio: irregularly irregular rhythm GI: soft, non-tender; bowel sounds normal; no masses,  no organomegaly Extremities: He still has significant edema of both lower extremities right more than left.  He has chronic venous stasis changes right greater than left  Lab Results:  Results for orders placed or performed during the hospital encounter of 01/18/18 (from the past 48 hour(s))  Comprehensive metabolic panel     Status: Abnormal   Collection Time: 01/18/18 12:38 PM  Result Value Ref Range   Sodium 137 135 - 145 mmol/L   Potassium 5.4 (H) 3.5 - 5.1 mmol/L   Chloride 103 101 - 111 mmol/L   CO2 26 22 - 32 mmol/L   Glucose, Bld 126 (H) 65 - 99 mg/dL   BUN 23 (H) 6 - 20 mg/dL   Creatinine, Ser 1.13 0.61 - 1.24 mg/dL   Calcium 8.9 8.9 - 10.3 mg/dL   Total Protein 7.0 6.5 - 8.1 g/dL   Albumin 4.0 3.5 - 5.0 g/dL   AST 18 15 - 41 U/L   ALT 17 17 - 63 U/L   Alkaline Phosphatase 135 (H) 38 - 126 U/L   Total Bilirubin 2.7 (H) 0.3 - 1.2 mg/dL   GFR calc non Af Amer >60 >60 mL/min   GFR calc Af Amer >60 >60 mL/min    Comment: (NOTE) The eGFR has been calculated using the CKD EPI equation. This calculation has not been validated in all clinical situations. eGFR's persistently <60 mL/min signify  possible Chronic Kidney Disease.    Anion gap 8 5 - 15    Comment: Performed at [facility], [address] [place], [place] [address]  CBC WITH DIFFERENTIAL     Status: Abnormal   Collection Time: 01/18/18 12:38 PM  Result Value Ref Range   WBC 4.1 4.0 - 10.5 K/uL   RBC 3.67 (L) 4.22 - 5.81 MIL/uL   Hemoglobin 11.4 (L) 13.0 - 17.0 g/dL   HCT 36.2 (L) 39.0 - 52.0 %   MCV 98.6 78.0 - 100.0 fL   MCH 31.1 26.0 - 34.0 pg   MCHC 31.5 30.0 - 36.0 g/dL   RDW 18.2 (H) 11.5 - 15.5 %   Platelets 129 (L) 150 - 400 K/uL   Neutrophils Relative % 80 %   Neutro Abs 3.4 1.7 - 7.7 K/uL   Lymphocytes Relative 11 %   Lymphs Abs 0.5 (L) 0.7 - 4.0 K/uL   Monocytes Relative 6 %   Monocytes Absolute 0.2 0.1 - 1.0 K/uL   Eosinophils Relative 2 %   Eosinophils Absolute 0.1 0.0 - 0.7 K/uL   Basophils Relative 1 %   Basophils Absolute 0.0 0.0 - 0.1 K/uL    Comment: Performed at [facility], [address], [place], [place]  [address]  Brain natriuretic peptide     Status: Abnormal   Collection Time: 01/18/18 12:38 PM  Result Value Ref Range   B Natriuretic Peptide 1,353.0 (H) 0.0 - 100.0 pg/mL    Comment: Performed at [facility], [address], [place], [place] [address]  TSH     Status: None   Collection Time: 01/18/18 12:38 PM  Result Value Ref Range   TSH 1.607 0.350 - 4.500 uIU/mL    Comment: Performed by a 3rd Generation assay with a functional sensitivity of <=0.01 uIU/mL. Performed at [facility], [address] [place], [place] [address]   Magnesium     Status: None   Collection Time: 01/18/18 12:38 PM  Result Value Ref Range   Magnesium 1.8 1.7 - 2.4 mg/dL    Comment: Performed at [facility], [address], [place], [place] [address]  Basic metabolic panel     Status: Abnormal   Collection Time: 01/19/18  6:24 AM  Result Value Ref Range   Sodium 136 135 - 145 mmol/L   Potassium 4.0 3.5 - 5.1 mmol/L    Comment: DELTA CHECK NOTED   Chloride 99 (L) 101 - 111 mmol/L   CO2 27 22 -  32 mmol/L   Glucose, Bld 99 65 - 99 mg/dL   BUN 22 (H) 6 - 20 mg/dL   Creatinine, Ser 1.08 0.61 - 1.24 mg/dL   Calcium 8.7 (L) 8.9 - 10.3 mg/dL   GFR calc non Af Amer >60 >60 mL/min   GFR calc Af Amer >60 >60 mL/min    Comment: (NOTE) The eGFR has been calculated using the CKD EPI equation. This calculation has not been validated in all clinical situations. eGFR's persistently <60 mL/min signify possible Chronic Kidney Disease.    Anion gap 10 5 - 15    Comment: Performed at [facility], [address], [place], [place] [address]    ABGS No results for input(s): PHART, PO2ART, TCO2, HCO3 in the last 72 hours.  Invalid input(s): PCO2 CULTURES No results found for this or any previous visit (from the past 240 hour(s)). Studies/Results: X-ray Chest Pa And Lateral  Result Date: 01/18/2018 CLINICAL DATA:  CHF EXAM: CHEST - 2 VIEW COMPARISON:  12/26/2017 FINDINGS: Cardiomegaly with vascular congestion. Diffuse interstitial prominence again noted compatible with edema. Rounded density in the left upper lobe again noted, stable. Small effusions. No acute bony abnormality. Right Port-A-Cath is unchanged. IMPRESSION: Cardiomegaly with vascular congestion and mild to moderate pulmonary edema. Small bilateral effusions. Left upper lobe nodule again noted, stable. No change since prior study. Electronically Signed   By: Rolm Baptise M.D.   On: 01/18/2018 15:02    Medications:  Prior to Admission:  Medications Prior to Admission  Medication Sig Dispense Refill Last Dose  . acetaminophen (TYLENOL) 500 MG tablet Take 1,000 mg by mouth every 6 (six) hours as needed for headache.   Not Taking  . alfuzosin (UROXATRAL) 10 MG 24 hr tablet Take 1 tablet (10 mg total) by mouth daily with breakfast. 30 tablet 12 Taking  . atorvastatin (LIPITOR) 40 MG tablet Take 1 tablet (40 mg total) by mouth daily at 6 PM. 90 tablet 3 Taking  . clonazePAM (KLONOPIN) 1 MG tablet Take 2 tablets (2 mg total) by mouth at  bedtime. 60 tablet 2 Taking  . diclofenac sodium (VOLTAREN) 1 % GEL Apply 4 g topically 4 (four) times daily as needed (for leg soreness). (Patient taking differently: Apply 4 g topically 4 (four)  times daily as needed (for leg soreness-mostly uses at bedtime). ) 1 Tube 2 Taking  . finasteride (PROSCAR) 5 MG tablet Take 1 tablet (5 mg total) by mouth daily. 30 tablet 12 Taking  . lidocaine-prilocaine (EMLA) cream Apply 1 application topically 2 (two) times daily as needed. Applies to ankle area when skin splits from edema.  4 Taking  . Multiple Vitamin (MULTIVITAMIN WITH MINERALS) TABS tablet Take 1 tablet by mouth daily.   Taking  . nitroGLYCERIN (NITROSTAT) 0.4 MG SL tablet Place 1 tablet (0.4 mg total) under the tongue every 5 (five) minutes as needed for chest pain (Max 3 doses within 15 minutes. Call 911 at 3rd dose). (Patient taking differently: Place 0.4 mg under the tongue every 5 (five) minutes as needed for chest pain (Max 3 doses within 15 minutes. Call 911 at 3rd dose). Uses very infrequently.)  12 Taking  . oxyCODONE-acetaminophen (PERCOCET) 10-325 MG tablet Take 1 tablet by mouth every 4 (four) hours as needed for pain.    Taking  . polyethylene glycol (MIRALAX / GLYCOLAX) packet Take 17 g by mouth daily. (Patient taking differently: Take 17 g by mouth daily as needed. ) 14 each 0 Taking  . potassium chloride SA (K-DUR,KLOR-CON) 20 MEQ tablet Take 1 tablet (20 mEq total) by mouth 2 (two) times daily. 60 tablet 12 Taking  . Pumpkin Seed-Soy Germ (AZO BLADDER CONTROL/GO-LESS PO) Take 20 mg by mouth 3 (three) times daily.   Taking  . rivaroxaban (XARELTO) 20 MG TABS tablet Take 1 tablet (20 mg total) by mouth daily with supper. 30 tablet 3 Taking  . rOPINIRole (REQUIP) 1 MG tablet TAKE 2 TABLETS BY MOUTH AT BEDTIME (Patient taking differently: TAKE 2 MG BY MOUTH AT BEDTIME) 60 tablet 0 Taking  . tiZANidine (ZANAFLEX) 4 MG tablet Take 4 mg by mouth 4 (four) times daily as needed for muscle  spasms.   12 Taking  . torsemide (DEMADEX) 20 MG tablet Take 2 tablets (40 mg total) by mouth daily. 60 tablet 12 Taking  . traZODone (DESYREL) 50 MG tablet Take 50 mg by mouth at bedtime.  5 Taking  . triamcinolone ointment (KENALOG) 0.1 % APPLY TO AFFECTED AREA TWICE A DAY 30 g 0 Taking   Scheduled: . atorvastatin  40 mg Oral q1800  . clonazePAM  2 mg Oral QHS  . finasteride  5 mg Oral Daily  . furosemide  80 mg Intravenous Q12H  . potassium chloride SA  20 mEq Oral BID  . rivaroxaban  20 mg Oral Q supper  . rOPINIRole  2 mg Oral QHS  . sodium chloride flush  3 mL Intravenous Q12H  . tamsulosin  0.8 mg Oral QPC supper  . traZODone  50 mg Oral QHS  . triamcinolone ointment   Topical BID   Continuous:  PXT:GGYIRSWNIOEVO, diclofenac sodium, lidocaine-prilocaine, nitroGLYCERIN, oxyCODONE-acetaminophen **AND** oxyCODONE, polyethylene glycol, tiZANidine  Assesment: He was admitted with congestive heart failure.  He has diuresed significantly he is down 6 L.  He feels better but he still has substantial amount of fluid. Active Problems:   CHF (congestive heart failure) ([place])    Plan: Continue IV diuresis    LOS: 1 day   Leeon Makar L 01/19/2018, 8:59 AM

## 2018-01-19 NOTE — Patient Outreach (Signed)
[facility]) Care Management [place] Coordination  01/19/2018  NEIKO TRIVEDI 1940/10/19 [id number]  Care Coordination outreach to [place], [place], and [place] re: Raymont Andreoni, 77 y/o male referred to [facility] after[facility] hospitalization April 29-Dec 30, 2017 for shortness of breath, significant bilateral lower extremity edema, weight gain of 30-plus pounds, CHF exacerbation. Patientwas discharged home to self-care after refusing home health services. Patient has history including, but not limited to, CAD with previous MI/ stenting; ischemic cardiomyopathy; sCHF; Atrial fibrillation; OSA on CPAP; BPH; and obesity.  Noted through review of EMR and collaboration with [facility] Pharmacist Joetta Manners that patient was re-hospitalized yesterday Jan 18, 2018 for ongoing shortness of breath/ dyspnea, and significant weight gain after his last hospital admission; patient re-admitted to hospital 19 days post- last hospital admission.  Secure communication via EMR sent to [place] notifying of patient's current hospital admission  Plan:  Stafford Courthouse RN CM will follow patient's progress while hospitalized and collaborate with [facility] liaison's for discharge disposition.  Oneta Rack, RN, BSN, [facility] Care Management  [phone]

## 2018-01-19 NOTE — Care Management Note (Signed)
Case Management Note  Patient Details  Name: [name] [name] MRN: [id number] Date of Birth: [birth year]  Subjective/Objective:            Admitted with CHF. Pt from home, ind pta. Drives. Has insurance and PCP. Is active with [facility]. Pt reports being compliant with his diuretics since last admission. He was changed from lasix to another medication. He has not had his f/u with urology yet and is scheduled to follow up with oncology on 28th. Pt uses cane with ambulation and is on oxygen acutely.        Action/Plan: DC home with self care. No CM needs noted at this time. Pt will cont to be followed by [facility] at DC.    Expected Discharge Date:  01/21/18               Expected Discharge Plan:  Home/Self Care  In-House Referral:  NA  Discharge planning Services  CM Consult  Post Acute Care Choice:  NA Choice offered to:  NA  Status of Service:  Completed, signed off  Sherald Barge, RN 01/19/2018, 1:29 PM

## 2018-01-20 LAB — BASIC METABOLIC PANEL
Anion gap: 10 (ref 5–15)
BUN: 24 mg/dL — ABNORMAL HIGH (ref 6–20)
CO2: 27 mmol/L (ref 22–32)
Calcium: 8.7 mg/dL — ABNORMAL LOW (ref 8.9–10.3)
Chloride: 98 mmol/L — ABNORMAL LOW (ref 101–111)
Creatinine, Ser: 1.1 mg/dL (ref 0.61–1.24)
GFR calc Af Amer: >60 mL/min (ref >60)
GFR calc non Af Amer: >60 mL/min (ref >60)
GLUCOSE: 108 mg/dL — AB (ref 65–99)
POTASSIUM: 4 mmol/L (ref 3.5–5.1)
Sodium: 135 mmol/L (ref 135–145)

## 2018-01-20 NOTE — Progress Notes (Signed)
Subjective: He was admitted with CHF.  He is down 10 L but still has significant swelling in his legs.  His breathing is better.  No chest pain.  No other complaints.  Objective: Vital signs in last 24 hours: Temp:  [97.7 F (36.5 C)-98 F (36.7 C)] 97.8 F (36.6 C) (05/24 0517) Pulse Rate:  [70-85] 75 (05/24 0517) Resp:  [18-20] 18 (05/23 2300) BP: (92-113)/(62-80) 92/62 (05/24 0517) SpO2:  [93 %-96 %] 95 % (05/24 0517) Weight:  [144.3 kg (318 lb 2 oz)] 144.3 kg (318 lb 2 oz) (05/24 0517) Weight change: -3.6 kg (-7 lb 15 oz) Last BM Date: 01/19/18  Intake/Output from previous day: 05/23 0701 - 05/24 0700 In: 840 [P.O.:840] Out: 5125 [Urine:5125]  PHYSICAL EXAM General appearance: alert, cooperative and no distress Resp: rales bibasilar Cardio: irregularly irregular rhythm GI: soft, non-tender; bowel sounds normal; no masses,  no organomegaly Extremities: He still has substantial edema and venous stasis changes bilaterally right leg worse than left  Lab Results:  Results for orders placed or performed during the hospital encounter of 01/18/18 (from the past 48 hour(s))  Comprehensive metabolic panel     Status: Abnormal   Collection Time: 01/18/18 12:38 PM  Result Value Ref Range   Sodium 137 135 - 145 mmol/L   Potassium 5.4 (H) 3.5 - 5.1 mmol/L   Chloride 103 101 - 111 mmol/L   CO2 26 22 - 32 mmol/L   Glucose, Bld 126 (H) 65 - 99 mg/dL   BUN 23 (H) 6 - 20 mg/dL   Creatinine, Ser 1.13 0.61 - 1.24 mg/dL   Calcium 8.9 8.9 - 10.3 mg/dL   Total Protein 7.0 6.5 - 8.1 g/dL   Albumin 4.0 3.5 - 5.0 g/dL   AST 18 15 - 41 U/L   ALT 17 17 - 63 U/L   Alkaline Phosphatase 135 (H) 38 - 126 U/L   Total Bilirubin 2.7 (H) 0.3 - 1.2 mg/dL   GFR calc non Af Amer >60 >60 mL/min   GFR calc Af Amer >60 >60 mL/min    Comment: (NOTE) The eGFR has been calculated using the CKD EPI equation. This calculation has not been validated in all clinical situations. eGFR's persistently <60 mL/min  signify possible Chronic Kidney Disease.    Anion gap 8 5 - 15    Comment: Performed at [facility], [address] [place], [place] [address]  CBC WITH DIFFERENTIAL     Status: Abnormal   Collection Time: 01/18/18 12:38 PM  Result Value Ref Range   WBC 4.1 4.0 - 10.5 K/uL   RBC 3.67 (L) 4.22 - 5.81 MIL/uL   Hemoglobin 11.4 (L) 13.0 - 17.0 g/dL   HCT 36.2 (L) 39.0 - 52.0 %   MCV 98.6 78.0 - 100.0 fL   MCH 31.1 26.0 - 34.0 pg   MCHC 31.5 30.0 - 36.0 g/dL   RDW 18.2 (H) 11.5 - 15.5 %   Platelets 129 (L) 150 - 400 K/uL   Neutrophils Relative % 80 %   Neutro Abs 3.4 1.7 - 7.7 K/uL   Lymphocytes Relative 11 %   Lymphs Abs 0.5 (L) 0.7 - 4.0 K/uL   Monocytes Relative 6 %   Monocytes Absolute 0.2 0.1 - 1.0 K/uL   Eosinophils Relative 2 %   Eosinophils Absolute 0.1 0.0 - 0.7 K/uL   Basophils Relative 1 %   Basophils Absolute 0.0 0.0 - 0.1 K/uL    Comment: Performed at [facility], [address], [place], [place] [address]  Brain natriuretic peptide     Status: Abnormal   Collection Time: 01/18/18 12:38 PM  Result Value Ref Range   B Natriuretic Peptide 1,353.0 (H) 0.0 - 100.0 pg/mL    Comment: Performed at [facility], [address], [place], [place] [address]  TSH     Status: None   Collection Time: 01/18/18 12:38 PM  Result Value Ref Range   TSH 1.607 0.350 - 4.500 uIU/mL    Comment: Performed by a 3rd Generation assay with a functional sensitivity of <=0.01 uIU/mL. Performed at [facility], [address], [place], [place] [address]   Magnesium     Status: None   Collection Time: 01/18/18 12:38 PM  Result Value Ref Range   Magnesium 1.8 1.7 - 2.4 mg/dL    Comment: Performed at [facility], [address] [place], [place] [address]  Basic metabolic panel     Status: Abnormal   Collection Time: 01/19/18  6:24 AM  Result Value Ref Range   Sodium 136 135 - 145 mmol/L   Potassium 4.0 3.5 - 5.1 mmol/L    Comment: DELTA CHECK NOTED   Chloride 99 (L) 101 - 111 mmol/L   CO2  27 22 - 32 mmol/L   Glucose, Bld 99 65 - 99 mg/dL   BUN 22 (H) 6 - 20 mg/dL   Creatinine, Ser 1.08 0.61 - 1.24 mg/dL   Calcium 8.7 (L) 8.9 - 10.3 mg/dL   GFR calc non Af Amer >60 >60 mL/min   GFR calc Af Amer >60 >60 mL/min    Comment: (NOTE) The eGFR has been calculated using the CKD EPI equation. This calculation has not been validated in all clinical situations. eGFR's persistently <60 mL/min signify possible Chronic Kidney Disease.    Anion gap 10 5 - 15    Comment: Performed at [facility], [address], [place], [place] [address]  Basic metabolic panel     Status: Abnormal   Collection Time: 01/20/18  5:44 AM  Result Value Ref Range   Sodium 135 135 - 145 mmol/L   Potassium 4.0 3.5 - 5.1 mmol/L   Chloride 98 (L) 101 - 111 mmol/L   CO2 27 22 - 32 mmol/L   Glucose, Bld 108 (H) 65 - 99 mg/dL   BUN 24 (H) 6 - 20 mg/dL   Creatinine, Ser 1.10 0.61 - 1.24 mg/dL   Calcium 8.7 (L) 8.9 - 10.3 mg/dL   GFR calc non Af Amer >60 >60 mL/min   GFR calc Af Amer >60 >60 mL/min    Comment: (NOTE) The eGFR has been calculated using the CKD EPI equation. This calculation has not been validated in all clinical situations. eGFR's persistently <60 mL/min signify possible Chronic Kidney Disease.    Anion gap 10 5 - 15    Comment: Performed at [facility], [address], [place],  [address]    ABGS No results for input(s): PHART, PO2ART, TCO2, HCO3 in the last 72 hours.  Invalid input(s): PCO2 CULTURES No results found for this or any previous visit (from the past 240 hour(s)). Studies/Results: X-ray Chest Pa And Lateral  Result Date: 01/18/2018 CLINICAL DATA:  CHF EXAM: CHEST - 2 VIEW COMPARISON:  12/26/2017 FINDINGS: Cardiomegaly with vascular congestion. Diffuse interstitial prominence again noted compatible with edema. Rounded density in the left upper lobe again noted, stable. Small effusions. No acute bony abnormality. Right Port-A-Cath is unchanged. IMPRESSION:  Cardiomegaly with vascular congestion and mild to  moderate pulmonary edema. Small bilateral effusions. Left upper lobe nodule again noted, stable. No change since prior study. Electronically Signed   By: Rolm Baptise M.D.   On: 01/18/2018 15:02    Medications:  Prior to Admission:  Medications Prior to Admission  Medication Sig Dispense Refill Last Dose  . acetaminophen (TYLENOL) 500 MG tablet Take 1,000 mg by mouth every 6 (six) hours as needed for headache.   unknown  . alfuzosin (UROXATRAL) 10 MG 24 hr tablet Take 1 tablet (10 mg total) by mouth daily with breakfast. 30 tablet 12 unknown  . atorvastatin (LIPITOR) 40 MG tablet Take 1 tablet (40 mg total) by mouth daily at 6 PM. 90 tablet 3 unknown  . diclofenac sodium (VOLTAREN) 1 % GEL Apply 4 g topically 4 (four) times daily as needed (for leg soreness). (Patient taking differently: Apply 4 g topically 4 (four) times daily as needed (for leg soreness-mostly uses at bedtime). ) 1 Tube 2 unknown  . finasteride (PROSCAR) 5 MG tablet Take 1 tablet (5 mg total) by mouth daily. 30 tablet 12 unknown  . lidocaine-prilocaine (EMLA) cream Apply 1 application topically 2 (two) times daily as needed. Applies to ankle area when skin splits from edema.  4 unknown  . Multiple Vitamin (MULTIVITAMIN WITH MINERALS) TABS tablet Take 1 tablet by mouth daily.   unknown  . nitroGLYCERIN (NITROSTAT) 0.4 MG SL tablet Place 1 tablet (0.4 mg total) under the tongue every 5 (five) minutes as needed for chest pain (Max 3 doses within 15 minutes. Call 911 at 3rd dose). (Patient taking differently: Place 0.4 mg under the tongue every 5 (five) minutes as needed for chest pain (Max 3 doses within 15 minutes. Call 911 at 3rd dose). Uses very infrequently.)  12 unknown  . oxyCODONE-acetaminophen (PERCOCET) 10-325 MG tablet Take 1 tablet by mouth every 4 (four) hours as needed for pain.    unknown  . polyethylene glycol (MIRALAX / GLYCOLAX) packet Take 17 g by mouth daily.  (Patient taking differently: Take 17 g by mouth daily as needed. ) 14 each 0 unknown  . potassium chloride SA (K-DUR,KLOR-CON) 20 MEQ tablet Take 1 tablet (20 mEq total) by mouth 2 (two) times daily. 60 tablet 12 unknown  . rivaroxaban (XARELTO) 20 MG TABS tablet Take 1 tablet (20 mg total) by mouth daily with supper. 30 tablet 3 unknown  . rOPINIRole (REQUIP) 1 MG tablet TAKE 2 TABLETS BY MOUTH AT BEDTIME (Patient taking differently: TAKE 2 MG BY MOUTH AT BEDTIME) 60 tablet 0 unknown  . tamsulosin (FLOMAX) 0.4 MG CAPS capsule Take 0.4 mg by mouth daily.   unknown  . tiZANidine (ZANAFLEX) 4 MG tablet Take 4 mg by mouth 4 (four) times daily as needed for muscle spasms.   12 unknown  . traZODone (DESYREL) 50 MG tablet Take 50 mg by mouth at bedtime.  5 unknown  . triamcinolone ointment (KENALOG) 0.1 % APPLY TO AFFECTED AREA TWICE A DAY 30 g 0 unknown  . clonazePAM (KLONOPIN) 1 MG tablet Take 2 tablets (2 mg total) by mouth at bedtime. 60 tablet 2 unknown   Scheduled: . atorvastatin  40 mg Oral q1800  . clonazePAM  2 mg Oral QHS  . finasteride  5 mg Oral Daily  . furosemide  80 mg Intravenous Q12H  . potassium chloride SA  20 mEq Oral BID  . rivaroxaban  20 mg Oral Q supper  . rOPINIRole  2 mg Oral QHS  . sodium chloride flush  3 mL Intravenous Q12H  . tamsulosin  0.8 mg Oral QPC supper  . traZODone  50 mg Oral QHS  . triamcinolone ointment   Topical BID   Continuous:  TEI:[id number], diclofenac sodium, lidocaine-prilocaine, nitroGLYCERIN, oxyCODONE-acetaminophen **AND** oxyCODONE, polyethylene glycol, tiZANidine  Assesment: He was admitted with CHF.  He is doing much better.  He still has substantial edema.  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  He had some help with his legs from Ace wraps last admission but I think he is too swollen at this point to try that.    LOS: 2 days   Lugene Beougher L 01/20/2018, 8:48 AM

## 2018-01-20 NOTE — Progress Notes (Signed)
Patient placed CPAP over nasal cannula at 3lpm. Patient is asleep

## 2018-01-20 NOTE — Care Management Important Message (Signed)
Important Message  Patient Details  Name: [name] [name] MRN: [id number] Date of Birth: 1941/07/12   Medicare Important Message Given:  Yes    [name] 01/20/2018, 12:08 PM

## 2018-01-21 LAB — BASIC METABOLIC PANEL
Anion gap: 12 (ref 5–15)
BUN: 26 mg/dL — AB (ref 6–20)
CALCIUM: 8.8 mg/dL — AB (ref 8.9–10.3)
CO2: 25 mmol/L (ref 22–32)
CREATININE: 1.16 mg/dL (ref 0.61–1.24)
Chloride: 96 mmol/L — ABNORMAL LOW (ref 101–111)
GFR calc Af Amer: >60 mL/min (ref >60)
GFR, EST NON AFRICAN AMERICAN: 60 mL/min — AB (ref >60)
GLUCOSE: 128 mg/dL — AB (ref 65–99)
Potassium: 4 mmol/L (ref 3.5–5.1)
SODIUM: 133 mmol/L — AB (ref 135–145)

## 2018-01-21 NOTE — Plan of Care (Signed)
  Problem: Activity: Goal: Risk for activity intolerance will decrease Outcome: Progressing   Problem: Coping: Goal: Level of anxiety will decrease Outcome: Progressing   Problem: Safety: Goal: Ability to remain free from injury will improve Outcome: Progressing   Problem: Skin Integrity: Goal: Risk for impaired skin integrity will decrease Outcome: Progressing

## 2018-01-21 NOTE — Progress Notes (Signed)
Patient normal wears CPAP 12 , at night he thought it was 5 but this heater temp.

## 2018-01-21 NOTE — Progress Notes (Signed)
Patient with chronic A. fib anticoagulated on Xarelto if 45% by echo generalized anasarca protein albumin status within normal parameters patient refuses compression hose states he wants Ace bandage wraps which he performs at home.  Currently on aggressive diuresis will check be met as well as magnesium level in a.m. in order Ace wraps to lower extremities [name] [name] MRN:[id number] DOB: 02/22/1941 DOA: 01/18/2018 PCP: System, Pcp Not In   Physical Exam: Blood pressure 115/69, pulse 61, temperature (!) 97.5 F (36.4 C), temperature source Oral, resp. rate 20, height 5' 11" (1.803 m), weight (!) 144.3 kg (318 lb 2 oz), SpO2 96 %.  Lungs diminished breath sounds in the bases no rales auscultated no heaves thrills or rubs noted heart irregularly irregular no S3 extremities 3+ chronic pitting edema   Investigations:  No results found for this or any previous visit (from the past 240 hour(s)).   Basic Metabolic Panel: Recent Labs    01/18/18 1238  01/20/18 0544 01/21/18 0607  NA 137   < > 135 133*  K 5.4*   < > 4.0 4.0  CL 103   < > 98* 96*  CO2 26   < > 27 25  GLUCOSE 126*   < > 108* 128*  BUN 23*   < > 24* 26*  CREATININE 1.13   < > 1.10 1.16  CALCIUM 8.9   < > 8.7* 8.8*  MG 1.8  --   --   --    < > = values in this interval not displayed.   Liver Function Tests: Recent Labs    01/18/18 1238  AST 18  ALT 17  ALKPHOS 135*  BILITOT 2.7*  PROT 7.0  ALBUMIN 4.0     CBC: Recent Labs    01/18/18 1238  WBC 4.1  NEUTROABS 3.4  HGB 11.4*  HCT 36.2*  MCV 98.6  PLT 129*    No results found.    Medicatio  Impression:  Active Problems:   CHF (congestive heart failure) (HCC)     Plan: Be met magnesium in a.m. Ace wraps to lower extremities for mobilizing third space fluids continue diuresis  Consultants:    Procedures   Antibiotics:     Time spent: 30-minute   LOS: 3 days   DONDIEGO,RICHARD M   01/21/2018, 10:41 AM    

## 2018-01-22 LAB — BASIC METABOLIC PANEL
Anion gap: 9 (ref 5–15)
BUN: 29 mg/dL — AB (ref 6–20)
CALCIUM: 9 mg/dL (ref 8.9–10.3)
CHLORIDE: 95 mmol/L — AB (ref 101–111)
CO2: 32 mmol/L (ref 22–32)
Creatinine, Ser: 1.25 mg/dL — ABNORMAL HIGH (ref 0.61–1.24)
GFR calc Af Amer: >60 mL/min (ref >60)
GFR calc non Af Amer: 55 mL/min — ABNORMAL LOW (ref >60)
GLUCOSE: 109 mg/dL — AB (ref 65–99)
Potassium: 4.2 mmol/L (ref 3.5–5.1)
Sodium: 136 mmol/L (ref 135–145)

## 2018-01-22 LAB — MAGNESIUM: Magnesium: 1.8 mg/dL (ref 1.7–2.4)

## 2018-01-22 NOTE — Progress Notes (Signed)
Decreased to 10 for patient comfort

## 2018-01-22 NOTE — Progress Notes (Signed)
Is left upper lobe mass documented by PET scan 2 weeks ago with an appointment this Wednesday in [place] with some surgeon I believe she took off his Ace wrap yesterday after nurse put them on renal function holding up potassium and magnesium within normal parameters continue high-dose IV Lasix for fluid overload and third space [name] [name] [id number] DOB: 08-29-1941 DOA: 01/18/2018 PCP: System, Pcp Not In   Physical Exam: Blood pressure 107/75, pulse 65, temperature (!) 97.5 F (36.4 C), temperature source Oral, resp. rate 20, height 5\' 11"  (1.803 m), weight (!) 144.3 kg (318 lb 2 oz), SpO2 100 %.  Lungs show prolonged expiratory phase scattered rhonchi diminished breath sounds in the bases no rales audible heart irregular regular no S3 no heaves thrills or rubs 3+ to 4+ pitting edema both lower extremities noted  Investigations:  No results found for this or any previous visit (from the past 240 hour(s)).   Basic Metabolic Panel: Recent Labs    01/21/18 0607 01/22/18 0636  NA 133* 136  K 4.0 4.2  CL 96* 95*  CO2 25 32  GLUCOSE 128* 109*  BUN 26* 29*  CREATININE 1.16 1.25*  CALCIUM 8.8* 9.0  MG  --  1.8   Liver Function Tests: No results for input(s): AST, ALT, ALKPHOS, BILITOT, PROT, ALBUMIN in the last 72 hours.   CBC: No results for input(s): WBC, NEUTROABS, HGB, HCT, MCV, PLT in the last 72 hours.  No results found.    Medications:   Impression:  Active Problems:   CHF (congestive heart failure) ([place])     Plan: Continue aggressive diuresis patient has a appointment this Wednesday in [place] concerning left upper lobe mass continue aggressive diuresis and Ace bandage wraps  Consultants:    Procedures   Antibiotics:            Time spent: 30 minutes   LOS: 4 days   [name] M   01/22/2018, 12:52 PM

## 2018-01-23 DIAGNOSIS — R918 Other nonspecific abnormal finding of lung field: Secondary | ICD-10-CM | POA: Diagnosis present

## 2018-01-23 LAB — BASIC METABOLIC PANEL
ANION GAP: 6 (ref 5–15)
BUN: 29 mg/dL — ABNORMAL HIGH (ref 6–20)
CO2: 33 mmol/L — ABNORMAL HIGH (ref 22–32)
Calcium: 8.6 mg/dL — ABNORMAL LOW (ref 8.9–10.3)
Chloride: 95 mmol/L — ABNORMAL LOW (ref 101–111)
Creatinine, Ser: 1.25 mg/dL — ABNORMAL HIGH (ref 0.61–1.24)
GFR calc non Af Amer: 55 mL/min — ABNORMAL LOW (ref >60)
GLUCOSE: 109 mg/dL — AB (ref 65–99)
POTASSIUM: 4 mmol/L (ref 3.5–5.1)
Sodium: 134 mmol/L — ABNORMAL LOW (ref 135–145)

## 2018-01-23 NOTE — Progress Notes (Signed)
Left upper lobe spiculated mass documented by PET scan patient has an appointment with outpatient surgery in several days time.  Volume of fluid overload third spacing anasarca spite normal plasma oncotic pressure protein and albumin's.  His legs are wrapped with Ace bandages there is compliance from keeping these on.  We are continuing high-dose diuresis and monitoring electrolytes and magnesium with third which are within normal parameters [name] [name] [id number] DOB: 28-Jan-1941 DOA: 01/18/2018 PCP: System, Pcp Not In   Physical Exam: Blood pressure (!) 82/44, pulse 61, temperature 97.6 F (36.4 C), temperature source Oral, resp. rate 20, height 5\' 11"  (1.803 m), weight (!) 144.3 kg (318 lb 2 oz), SpO2 100 %.  Lungs diminished breath sounds in the bases prolonged expiratory phase no rales wheeze or rhonchi appreciable heart irregular rhythm no S3 no heaves thrills rubs.  3+ pitting edema of lower extremities bilaterally   Investigations:  No results found for this or any previous visit (from the past 240 hour(s)).   Basic Metabolic Panel: Recent Labs    01/22/18 0636 01/23/18 0622  NA 136 134*  K 4.2 4.0  CL 95* 95*  CO2 32 33*  GLUCOSE 109* 109*  BUN 29* 29*  CREATININE 1.25* 1.25*  CALCIUM 9.0 8.6*  MG 1.8  --    Liver Function Tests: No results for input(s): AST, ALT, ALKPHOS, BILITOT, PROT, ALBUMIN in the last 72 hours.   CBC: No results for input(s): WBC, NEUTROABS, HGB, HCT, MCV, PLT in the last 72 hours.  No results found.    Medications:   Impression:  Active Problems:   CHF (congestive heart failure) (HCC)     Plan: Continue vigorous diuresis monitor INO continue Ace bandage wrap and follow-up with surgery regarding left upper lobe spiculated mass  Consultants:    Procedures   Antibiotics:         Time spent: 30 minutes   LOS: 5 days   Feliza Diven M   01/23/2018, 10:54 AM

## 2018-01-23 NOTE — Care Management Important Message (Signed)
Important Message  Patient Details  Name: HULON FERRON MRN: [id number] Date of Birth: Jul 31, 1941   Medicare Important Message Given:  Yes    [name] 01/23/2018, 11:20 AM

## 2018-01-24 ENCOUNTER — Encounter: Payer: Medicare Other | Admitting: Thoracic Surgery (Cardiothoracic Vascular Surgery)

## 2018-01-24 ENCOUNTER — Ambulatory Visit: Payer: Self-pay

## 2018-01-24 LAB — BASIC METABOLIC PANEL
ANION GAP: 9 (ref 5–15)
BUN: 27 mg/dL — ABNORMAL HIGH (ref 6–20)
CHLORIDE: 94 mmol/L — AB (ref 101–111)
CO2: 33 mmol/L — ABNORMAL HIGH (ref 22–32)
CREATININE: 1.17 mg/dL (ref 0.61–1.24)
Calcium: 8.9 mg/dL (ref 8.9–10.3)
GFR calc Af Amer: >60 mL/min (ref >60)
GFR calc non Af Amer: 59 mL/min — ABNORMAL LOW (ref >60)
Glucose, Bld: 114 mg/dL — ABNORMAL HIGH (ref 65–99)
POTASSIUM: 3.9 mmol/L (ref 3.5–5.1)
SODIUM: 136 mmol/L (ref 135–145)

## 2018-01-24 MED ORDER — FLUTICASONE PROPIONATE 50 MCG/ACT NA SUSP
1.0000 | Freq: Every day | NASAL | Status: DC
  Administered 2018-01-24: 1 via NASAL
  Filled 2018-01-24: qty 16

## 2018-01-24 MED ORDER — TORSEMIDE 20 MG PO TABS
40.0000 mg | ORAL_TABLET | Freq: Two times a day (BID) | ORAL | Status: DC
  Administered 2018-01-24: 40 mg via ORAL
  Filled 2018-01-24: qty 2

## 2018-01-24 MED ORDER — SALINE SPRAY 0.65 % NA SOLN: 1.0000 | NASAL | Status: DC | PRN

## 2018-01-24 MED ORDER — POTASSIUM CHLORIDE CRYS ER 20 MEQ PO TBCR: 20.0000 meq | EXTENDED_RELEASE_TABLET | Freq: Two times a day (BID) | ORAL | 12 refills | Status: DC

## 2018-01-24 MED ORDER — TORSEMIDE 20 MG PO TABS: 40.0000 mg | ORAL_TABLET | Freq: Two times a day (BID) | ORAL | 5 refills | Status: DC

## 2018-01-24 NOTE — Care Management (Signed)
Pt will rquire home oxygen at 3lpm. Pt has no preference of DME provider. He uses [facility] for his pressure device. He would like to use any provider that can provide POC. CM contacted Ashly, [employer] rep, who is able to accept referral and deliver port device to pt room prior ot DC today.

## 2018-01-24 NOTE — Progress Notes (Signed)
Patient voided 150 ml.  First void sine foley catheter removed.

## 2018-01-24 NOTE — Progress Notes (Signed)
[name] [name] discharged Home per MD order.  Discharge instructions reviewed and discussed with the patient, all questions and concerns answered. Copy of instructions and scripts given to patient.  Allergies as of 01/24/2018   No Known Allergies     Medication List    STOP taking these medications   alfuzosin 10 MG 24 hr tablet Commonly known as:  UROXATRAL     TAKE these medications   acetaminophen 500 MG tablet Commonly known as:  TYLENOL Take 1,000 mg by mouth every 6 (six) hours as needed for headache.   atorvastatin 40 MG tablet Commonly known as:  LIPITOR Take 1 tablet (40 mg total) by mouth daily at 6 PM.   clonazePAM 1 MG tablet Commonly known as:  KLONOPIN Take 2 tablets (2 mg total) by mouth at bedtime.   diclofenac sodium 1 % Gel Commonly known as:  VOLTAREN Apply 4 g topically 4 (four) times daily as needed (for leg soreness). What changed:  reasons to take this   finasteride 5 MG tablet Commonly known as:  PROSCAR Take 1 tablet (5 mg total) by mouth daily.   lidocaine-prilocaine cream Commonly known as:  EMLA Apply 1 application topically 2 (two) times daily as needed. Applies to ankle area when skin splits from edema.   multivitamin with minerals Tabs tablet Take 1 tablet by mouth daily.   nitroGLYCERIN 0.4 MG SL tablet Commonly known as:  NITROSTAT Place 1 tablet (0.4 mg total) under the tongue every 5 (five) minutes as needed for chest pain (Max 3 doses within 15 minutes. Call 911 at 3rd dose). What changed:  additional instructions   oxyCODONE-acetaminophen 10-325 MG tablet Commonly known as:  PERCOCET Take 1 tablet by mouth every 4 (four) hours as needed for pain.   potassium chloride SA 20 MEQ tablet Commonly known as:  K-DUR,KLOR-CON Take 1 tablet (20 mEq total) by mouth 2 (two) times daily.   rivaroxaban 20 MG Tabs tablet Commonly known as:  XARELTO Take 1 tablet (20 mg total) by mouth daily with supper.   rOPINIRole 1 MG  tablet Commonly known as:  REQUIP TAKE 2 TABLETS BY MOUTH AT BEDTIME What changed:    how much to take  how to take this  when to take this   tamsulosin 0.4 MG Caps capsule Commonly known as:  FLOMAX Take 0.4 mg by mouth daily.   tiZANidine 4 MG tablet Commonly known as:  ZANAFLEX Take 4 mg by mouth 4 (four) times daily as needed for muscle spasms.   torsemide 20 MG tablet Commonly known as:  DEMADEX Take 2 tablets (40 mg total) by mouth 2 (two) times daily.   traZODone 50 MG tablet Commonly known as:  DESYREL Take 50 mg by mouth at bedtime.   triamcinolone ointment 0.1 % Commonly known as:  KENALOG APPLY TO AFFECTED AREA TWICE A DAY            Durable Medical Equipment  (From admission, onward)        Start     Ordered   01/24/18 1458  For home use only DME oxygen  Once    Comments:  Please deliver Portable oxygen concentrator to hospital room  Question Answer Comment  Mode or (Route) Nasal cannula   Liters per Minute 2   Frequency Continuous (stationary and portable oxygen unit needed)   Oxygen conserving device Yes   Oxygen delivery system Gas      01/24/18 1457  Patient escorted to car by NT in a wheelchair,  no distress noted upon discharge.  Ralene Muskrat Maki Sweetser 01/24/2018 4:36 PM

## 2018-01-24 NOTE — Care Management Note (Signed)
Case Management Note  Patient Details  Name: [name] [name] MRN: [id number] Date of Birth: [birth year]   Expected Discharge Date:  01/21/18               Expected Discharge Plan:  Home/Self Care  In-House Referral:  NA  Discharge planning Services  CM Consult  Post Acute Care Choice:  NA Choice offered to:  NA  Status of Service:  Completed, signed off  If discussed at [place] of Stay Meetings, dates discussed:  01/24/18  Additional Comments: DC home today with self care and continuation of [facility] services. No CM needs noted at this time.   Sherald Barge, RN 01/24/2018, 12:07 PM

## 2018-01-24 NOTE — Progress Notes (Signed)
SATURATION QUALIFICATIONS: (This note is used to comply with regulatory documentation for home oxygen)  Patient Saturations on Room Air at Rest = 92%  Patient Saturations on Room Air while Ambulating = 85%  Patient Saturations on 2 Liters of oxygen while Ambulating = 92%  [name] notified of patient's O2 sats.  Orders to be placed for home oxygen.  Case management notified and stated will come and speak with patient.

## 2018-01-24 NOTE — Discharge Summary (Signed)
Physician Discharge Summary  Patient ID: [name] [name] MRN: [id number] DOB/AGE: 11/19/1940 77 y.o. Primary Care Physician:[facility], P[facility] Not In Admit date: 01/18/2018 Discharge date: 01/24/2018    Discharge Diagnoses:   Active Problems:   OSA- on C-pap   RESTLESS LEG SYNDROME   Ischemic cardiomyopathy   Morbid obesity due to excess calories (HCC)   Acute systolic CHF (congestive heart failure), NYHA class 3 (HCC)   Persistent atrial fibrillation (HCC)   BPH (benign prostatic hyperplasia)   CHF (congestive heart failure) (HCC)   Mass of upper lobe of left lung   Allergies as of 01/24/2018   No Known Allergies     Medication List    STOP taking these medications   alfuzosin 10 MG 24 hr tablet Commonly known as:  UROXATRAL     TAKE these medications   acetaminophen 500 MG tablet Commonly known as:  TYLENOL Take 1,000 mg by mouth every 6 (six) hours as needed for headache.   atorvastatin 40 MG tablet Commonly known as:  LIPITOR Take 1 tablet (40 mg total) by mouth daily at 6 PM.   clonazePAM 1 MG tablet Commonly known as:  KLONOPIN Take 2 tablets (2 mg total) by mouth at bedtime.   diclofenac sodium 1 % Gel Commonly known as:  VOLTAREN Apply 4 g topically 4 (four) times daily as needed (for leg soreness). What changed:  reasons to take this   finasteride 5 MG tablet Commonly known as:  PROSCAR Take 1 tablet (5 mg total) by mouth daily.   lidocaine-prilocaine cream Commonly known as:  EMLA Apply 1 application topically 2 (two) times daily as needed. Applies to ankle area when skin splits from edema.   multivitamin with minerals Tabs tablet Take 1 tablet by mouth daily.   nitroGLYCERIN 0.4 MG SL tablet Commonly known as:  NITROSTAT Place 1 tablet (0.4 mg total) under the tongue every 5 (five) minutes as needed for chest pain (Max 3 doses within 15 minutes. Call 911 at 3rd dose). What changed:  additional instructions   oxyCODONE-acetaminophen 10-325 MG  tablet Commonly known as:  PERCOCET Take 1 tablet by mouth every 4 (four) hours as needed for pain.   potassium chloride SA 20 MEQ tablet Commonly known as:  K-DUR,KLOR-CON Take 1 tablet (20 mEq total) by mouth 2 (two) times daily.   rivaroxaban 20 MG Tabs tablet Commonly known as:  XARELTO Take 1 tablet (20 mg total) by mouth daily with supper.   rOPINIRole 1 MG tablet Commonly known as:  REQUIP TAKE 2 TABLETS BY MOUTH AT BEDTIME What changed:    how much to take  how to take this  when to take this   tamsulosin 0.4 MG Caps capsule Commonly known as:  FLOMAX Take 0.4 mg by mouth daily.   tiZANidine 4 MG tablet Commonly known as:  ZANAFLEX Take 4 mg by mouth 4 (four) times daily as needed for muscle spasms.   torsemide 20 MG tablet Commonly known as:  DEMADEX Take 2 tablets (40 mg total) by mouth 2 (two) times daily.   traZODone 50 MG tablet Commonly known as:  DESYREL Take 50 mg by mouth at bedtime.   triamcinolone ointment 0.1 % Commonly known as:  KENALOG APPLY TO AFFECTED AREA TWICE A DAY       Discharged Condition: Improved    Consults: None  Significant Diagnostic Studies: X-ray Chest Pa And Lateral  Result Date: 01/18/2018 CLINICAL DATA:  CHF EXAM: CHEST - 2 VIEW COMPARISON:  12/26/2017 FINDINGS: Cardiomegaly with vascular congestion. Diffuse interstitial prominence again noted compatible with edema. Rounded density in the left upper lobe again noted, stable. Small effusions. No acute bony abnormality. Right Port-A-Cath is unchanged. IMPRESSION: Cardiomegaly with vascular congestion and mild to moderate pulmonary edema. Small bilateral effusions. Left upper lobe nodule again noted, stable. No change since prior study. Electronically Signed   By: Rolm Baptise M.D.   On: 01/18/2018 15:02   X-ray Chest Pa And Lateral  Result Date: 12/26/2017 CLINICAL DATA:  CHF history of hypertension EXAM: CHEST - 2 VIEW COMPARISON:  05/13/2017, CT chest 05/16/2017,  PET-CT 05/13/2014 FINDINGS: Right-sided central venous port tip overlies the proximal right atrium. Cardiomegaly with vascular congestion and diffuse interstitial opacity suspicious for pulmonary edema. Small pleural effusions. No pneumothorax. Suspected 2.5 cm left apical lung nodule. IMPRESSION: 1. Cardiomegaly with vascular congestion and mild to moderate pulmonary edema. Small pleural effusion 2. Suspected 2.5 cm left apical lung nodule. Recommend CT chest to further evaluate. Electronically Signed   By: Donavan Foil M.D.   On: 12/26/2017 20:16   Ct Chest W Contrast  Result Date: 12/27/2017 CLINICAL DATA:  Left apical nodule seen on chest x-ray. EXAM: CT CHEST WITH CONTRAST TECHNIQUE: Multidetector CT imaging of the chest was performed during intravenous contrast administration. CONTRAST:  49mL ISOVUE-300 IOPAMIDOL (ISOVUE-300) INJECTION 61% COMPARISON:  Chest x-ray 12/26/2017.  Chest CT 05/16/2017. FINDINGS: Cardiovascular: There is cardiomegaly. Diffuse coronary artery calcifications. Scattered aortic calcifications and tortuosity. No evidence of aortic aneurysm or dissection. Mild vascular congestion. Mediastinum/Nodes: Mildly enlarged left axillary lymph nodes, new since prior study. Index left axillary lymph node has a short axis diameter of 15 mm on image 31. Scattered small mediastinal lymph nodes are stable since prior study. Airways patent. Esophagus grossly unremarkable. Lungs/Pleura: Irregular spiculated nodule in the left upper lobe corresponding to the density seen on chest x-ray measures up to 2.7 cm on image 24. This compares to 2.1 cm on prior CT. Moderate right pleural effusion and small left pleural effusion. Dependent and bibasilar atelectasis. Compressive atelectasis in the right lower lobe. Upper Abdomen: Layering gallstones within the gallbladder. Small amount of perihepatic and perisplenic ascites. Musculoskeletal: Mild edema throughout the chest wall suggesting anasarca. No acute or  focal bony abnormality. IMPRESSION: 2.7 cm spiculated nodule in the medial left upper lobe/apex concerning for lung cancer. This could be further evaluated with PET CT. Cardiomegaly, diffuse coronary artery disease. Mild vascular congestion. Moderate right pleural effusion and small left pleural effusion. Small and borderline sized scattered mediastinal lymph nodes are stable since prior study. Enlarging left axillary lymph nodes. Electronically Signed   By: Rolm Baptise M.D.   On: 12/27/2017 10:37   Nm Pet Image Initial (pi) Skull Base To Thigh  Result Date: 01/10/2018 CLINICAL DATA:  Initial treatment strategy for left lung mass. EXAM: NUCLEAR MEDICINE PET SKULL BASE TO THIGH TECHNIQUE: 10.44 mCi F-18 FDG was injected intravenously. Full-ring PET imaging was performed from the skull base to thigh after the radiotracer. CT data was obtained and used for attenuation correction and anatomic localization. Fasting blood glucose: 90 mg/dl COMPARISON:  CT chest dated 12/27/2017 FINDINGS: Mediastinal blood pool activity: SUV max 2.1 NECK: No hypermetabolic cervical lymphadenopathy. Incidental CT findings: none CHEST: 3.0 x 2.3 cm mass in the medial left lung apex, max SUV 8.7, compatible with primary bronchogenic neoplasm. No hypermetabolic thoracic lymphadenopathy. Incidental CT findings: Cardiomegaly with mild interstitial edema. Small right and trace left pleural effusions. Atherosclerotic calcifications of the aortic arch.  Three vessel coronary atherosclerosis. Right chest port terminating at the cavoatrial junction. ABDOMEN/PELVIS: No abnormal hypermetabolism in the liver, spleen, pancreas, or adrenal glands. No hypermetabolic abdominopelvic lymphadenopathy. Incidental CT findings: Mildly nodular hepatic contour. Cholelithiasis. Mild mesenteric fluid/edema with trace perihepatic ascites. Atherosclerotic calcifications the abdominal aorta and branch vessels. Small fat containing periumbilical hernia. Body wall  edema. SKELETON: No focal hypermetabolic activity to suggest skeletal metastasis. Incidental CT findings: Degenerative changes the visualized thoracolumbar spine. IMPRESSION: 3.0 cm mass in the medial left lung apex, compatible with primary bronchogenic neoplasm. No findings suspicious for metastatic disease. Electronically Signed   By: Julian Hy M.D.   On: 01/10/2018 09:14    Lab Results: Basic Metabolic Panel: Recent Labs    01/22/18 0636 01/23/18 0622 01/24/18 0500  NA 136 134* 136  K 4.2 4.0 3.9  CL 95* 95* 94*  CO2 32 33* 33*  GLUCOSE 109* 109* 114*  BUN 29* 29* 27*  CREATININE 1.25* 1.25* 1.17  CALCIUM 9.0 8.6* 8.9  MG 1.8  --   --    Liver Function Tests: No results for input(s): AST, ALT, ALKPHOS, BILITOT, PROT, ALBUMIN in the last 72 hours.   CBC: No results for input(s): WBC, NEUTROABS, HGB, HCT, MCV, PLT in the last 72 hours.  No results found for this or any previous visit (from the past 240 hour(s)).   Hospital Course: This is a 77 year old he came to my office with increasing shortness of breath.  He had been in the hospital earlier this month with heart failure.  He had gained about 25 pounds.  He had significant edema of both legs and was short of breath at rest.  He was admitted for IV diuresis.  He is lost about 35 pounds.  He is not a candidate for an ACE inhibitor or ARB because of worsening renal failure with those classes of drugs.  By the time of discharge she was much improved.  His lungs were clear he remained in atrial fib.  He still has swelling of his legs.  Discharge Exam: Blood pressure 98/67, pulse 75, temperature 97.9 F (36.6 C), temperature source Oral, resp. rate 18, height 5\' 11"  (1.803 m), weight (!) 136.7 kg (301 lb 6.4 oz), SpO2 91 %. He is awake and alert.  He looks more comfortable.  He has swelling of his legs.  He does not have any fluid in his chest now.  He still has atrial fib.  Disposition: Home return to my office in about a  week      Signed: Petina Muraski L   01/24/2018, 9:01 AM

## 2018-01-24 NOTE — Progress Notes (Signed)
Patient's foley catheter removed per order.  Patient tolerated well.  500 ml of urine emptied from foley catheter bag.

## 2018-01-24 NOTE — Progress Notes (Signed)
Subjective: He says he feels better.  He has an appointment with thoracic oncology clinic tomorrow so he wants to go home today.  Objective: Vital signs in last 24 hours: Temp:  [97.9 F (36.6 C)-98.9 F (37.2 C)] 97.9 F (36.6 C) (05/28 0544) Pulse Rate:  [75-88] 75 (05/28 0544) Resp:  [18-20] 18 (05/28 0544) BP: (98-119)/(66-83) 98/67 (05/28 0544) SpO2:  [91 %-98 %] 91 % (05/28 0544) Weight:  [136.7 kg (301 lb 6.4 oz)-138.3 kg (305 lb)] 136.7 kg (301 lb 6.4 oz) (05/28 0600) Weight change:  Last BM Date: 01/22/18  Intake/Output from previous day: 05/27 0701 - 05/28 0700 In: 640 [P.O.:640] Out: 4400 [Urine:4400]  PHYSICAL EXAM General appearance: alert, cooperative and no distress Resp: clear to auscultation bilaterally Cardio: irregularly irregular rhythm GI: soft, non-tender; bowel sounds normal; no masses,  no organomegaly Extremities: extremities normal, atraumatic, no cyanosis or edema  Lab Results:  Results for orders placed or performed during the hospital encounter of 01/18/18 (from the past 48 hour(s))  Basic metabolic panel     Status: Abnormal   Collection Time: 01/23/18  6:22 AM  Result Value Ref Range   Sodium 134 (L) 135 - 145 mmol/L   Potassium 4.0 3.5 - 5.1 mmol/L   Chloride 95 (L) 101 - 111 mmol/L   CO2 33 (H) 22 - 32 mmol/L   Glucose, Bld 109 (H) 65 - 99 mg/dL   BUN 29 (H) 6 - 20 mg/dL   Creatinine, Ser 1.25 (H) 0.61 - 1.24 mg/dL   Calcium 8.6 (L) 8.9 - 10.3 mg/dL   GFR calc non Af Amer 55 (L) >60 mL/min   GFR calc Af Amer >60 >60 mL/min    Comment: (NOTE) The eGFR has been calculated using the CKD EPI equation. This calculation has not been validated in all clinical situations. eGFR's persistently <60 mL/min signify possible Chronic Kidney Disease.    Anion gap 6 5 - 15    Comment: Performed at [facility], [address], [place], [place] [address]  Basic metabolic panel     Status: Abnormal   Collection Time: 01/24/18  5:00 AM  Result Value  Ref Range   Sodium 136 135 - 145 mmol/L   Potassium 3.9 3.5 - 5.1 mmol/L   Chloride 94 (L) 101 - 111 mmol/L   CO2 33 (H) 22 - 32 mmol/L   Glucose, Bld 114 (H) 65 - 99 mg/dL   BUN 27 (H) 6 - 20 mg/dL   Creatinine, Ser 1.17 0.61 - 1.24 mg/dL   Calcium 8.9 8.9 - 10.3 mg/dL   GFR calc non Af Amer 59 (L) >60 mL/min   GFR calc Af Amer >60 >60 mL/min    Comment: (NOTE) The eGFR has been calculated using the CKD EPI equation. This calculation has not been validated in all clinical situations. eGFR's persistently <60 mL/min signify possible Chronic Kidney Disease.    Anion gap 9 5 - 15    Comment: Performed at [facility], [address] [place], [place] [address]    ABGS No results for input(s): PHART, PO2ART, TCO2, HCO3 in the last 72 hours.  Invalid input(s): PCO2 CULTURES No results found for this or any previous visit (from the past 240 hour(s)). Studies/Results: No results found.  Medications:  Prior to Admission:  Medications Prior to Admission  Medication Sig Dispense Refill Last Dose  . acetaminophen (TYLENOL) 500 MG tablet Take 1,000 mg by mouth every 6 (six) hours as needed for headache.  unknown  . alfuzosin (UROXATRAL) 10 MG 24 hr tablet Take 1 tablet (10 mg total) by mouth daily with breakfast. 30 tablet 12 01/19/2018 at Unknown time  . atorvastatin (LIPITOR) 40 MG tablet Take 1 tablet (40 mg total) by mouth daily at 6 PM. 90 tablet 3 01/19/2018 at Unknown time  . clonazePAM (KLONOPIN) 1 MG tablet Take 2 tablets (2 mg total) by mouth at bedtime. 60 tablet 2 01/19/2018 at Unknown time  . diclofenac sodium (VOLTAREN) 1 % GEL Apply 4 g topically 4 (four) times daily as needed (for leg soreness). (Patient taking differently: Apply 4 g topically 4 (four) times daily as needed (for leg soreness-mostly uses at bedtime). ) 1 Tube 2 unknown  . finasteride (PROSCAR) 5 MG tablet Take 1 tablet (5 mg total) by mouth daily. 30 tablet 12 01/19/2018 at Unknown time  . lidocaine-prilocaine  (EMLA) cream Apply 1 application topically 2 (two) times daily as needed. Applies to ankle area when skin splits from edema.  4 01/20/2018 at Unknown time  . Multiple Vitamin (MULTIVITAMIN WITH MINERALS) TABS tablet Take 1 tablet by mouth daily.   01/19/2018 at Unknown time  . nitroGLYCERIN (NITROSTAT) 0.4 MG SL tablet Place 1 tablet (0.4 mg total) under the tongue every 5 (five) minutes as needed for chest pain (Max 3 doses within 15 minutes. Call 911 at 3rd dose). (Patient taking differently: Place 0.4 mg under the tongue every 5 (five) minutes as needed for chest pain (Max 3 doses within 15 minutes. Call 911 at 3rd dose). Uses very infrequently.)  12 unknown  . oxyCODONE-acetaminophen (PERCOCET) 10-325 MG tablet Take 1 tablet by mouth every 4 (four) hours as needed for pain.    01/19/2018 at Unknown time  . rivaroxaban (XARELTO) 20 MG TABS tablet Take 1 tablet (20 mg total) by mouth daily with supper. 30 tablet 3 01/19/2018 at 1800  . rOPINIRole (REQUIP) 1 MG tablet TAKE 2 TABLETS BY MOUTH AT BEDTIME (Patient taking differently: TAKE 2 MG BY MOUTH AT BEDTIME) 60 tablet 0 01/19/2018 at Unknown time  . tiZANidine (ZANAFLEX) 4 MG tablet Take 4 mg by mouth 4 (four) times daily as needed for muscle spasms.   12 01/19/2018 at Unknown time  . traZODone (DESYREL) 50 MG tablet Take 50 mg by mouth at bedtime.  5 01/19/2018 at Unknown time  . triamcinolone ointment (KENALOG) 0.1 % APPLY TO AFFECTED AREA TWICE A DAY 30 g 0 unknown  . tamsulosin (FLOMAX) 0.4 MG CAPS capsule Take 0.4 mg by mouth daily.      Scheduled: . atorvastatin  40 mg Oral q1800  . clonazePAM  2 mg Oral QHS  . finasteride  5 mg Oral Daily  . fluticasone  1 spray Each Nare Daily  . furosemide  80 mg Intravenous Q12H  . potassium chloride SA  20 mEq Oral BID  . rivaroxaban  20 mg Oral Q supper  . rOPINIRole  2 mg Oral QHS  . sodium chloride flush  3 mL Intravenous Q12H  . tamsulosin  0.8 mg Oral QPC supper  . traZODone  50 mg Oral QHS  .  triamcinolone ointment   Topical BID   Continuous:  PRN:acetaminophen, diclofenac sodium, lidocaine-prilocaine, nitroGLYCERIN, oxyCODONE-acetaminophen **AND** oxyCODONE, polyethylene glycol, sodium chloride, tiZANidine  Assesment: He was admitted with congestive heart failure and he is down about 35 pounds.  He feels much better.  He has a mass in the left upper lobe and he is going to the thoracic oncology clinic today   so I think it is best to go ahead and discharge him this afternoon.  He will be switched to oral medications today. Active Problems:   CHF (congestive heart failure) (HCC)   Mass of upper lobe of left lung    Plan: Switch to oral meds take his Foley catheter out discharge later today    LOS: 6 days   Mahi Zabriskie L 01/24/2018, 8:54 AM

## 2018-01-25 ENCOUNTER — Encounter: Payer: Self-pay | Admitting: *Deleted

## 2018-01-25 ENCOUNTER — Other Ambulatory Visit: Payer: Self-pay

## 2018-01-25 ENCOUNTER — Ambulatory Visit: Payer: Self-pay

## 2018-01-25 ENCOUNTER — Other Ambulatory Visit: Payer: Self-pay | Admitting: *Deleted

## 2018-01-25 NOTE — Patient Outreach (Signed)
[facility]) Care Management  [place]   01/25/2018  [name] [name] [birth year] [id number]   77year old malereferred to [place] Management.[place] services requested for medication managementPMHx includes, but not limited to, CAD, MI, ischemic cardiomyopathy, chronic systolic heart failure, h/o DVT, venous insufficiencyofboth lower extremities, obstructive sleep apnea, benign prostatic hyperplasiaandrestless leg syndrome.  Successful outreach attempt to [name] [name].  HIPAA identifiers verified.  Subjective: [name] [name] reports that he feels "alright" except when he gets out in the heat.  He reports that he is now on 2 L of oxygen and that he is weighing himself every morning.Marland Kitchen He states that he was 310 lbs Monday and 305 lbs Tuesday on the hospital's scale and that he was 296 lbs on his home scale today. He reports that he is to notify [name] if he gains 3 lbs overnight or 5 lbs in one week.   He states that he is to call [name]' office to make an appointment.  Objective:  SCr 1.17mg /dL K 3.9 mmol/L  Current Medications: Current Outpatient Medications  Medication Sig Dispense Refill  . alfuzosin (UROXATRAL) 10 MG 24 hr tablet Take 10 mg by mouth daily with breakfast.    . atorvastatin (LIPITOR) 40 MG tablet Take 1 tablet (40 mg total) by mouth daily at 6 PM. 90 tablet 3  . clonazePAM (KLONOPIN) 1 MG tablet Take 2 tablets (2 mg total) by mouth at bedtime. 60 tablet 2  . diclofenac sodium (VOLTAREN) 1 % GEL Apply 4 g topically 4 (four) times daily as needed (for leg soreness). (Patient taking differently: Apply 4 g topically 4 (four) times daily as needed (for leg soreness-mostly uses at bedtime). ) 1 Tube 2  . finasteride (PROSCAR) 5 MG tablet Take 1 tablet (5 mg total) by mouth daily. 30 tablet 12  . lidocaine-prilocaine (EMLA) cream Apply 1 application topically 2 (two) times daily as needed. Applies to ankle area when skin splits  from edema.  4  . Multiple Vitamin (MULTIVITAMIN WITH MINERALS) TABS tablet Take 1 tablet by mouth daily.    . nitroGLYCERIN (NITROSTAT) 0.4 MG SL tablet Place 1 tablet (0.4 mg total) under the tongue every 5 (five) minutes as needed for chest pain (Max 3 doses within 15 minutes. Call 911 at 3rd dose). (Patient taking differently: Place 0.4 mg under the tongue every 5 (five) minutes as needed for chest pain (Max 3 doses within 15 minutes. Call 911 at 3rd dose). Uses very infrequently.)  12  . oxyCODONE-acetaminophen (PERCOCET) 10-325 MG tablet Take 1 tablet by mouth every 4 (four) hours as needed for pain.     . potassium chloride SA (K-DUR,KLOR-CON) 20 MEQ tablet Take 1 tablet (20 mEq total) by mouth 2 (two) times daily. 60 tablet 12  . rivaroxaban (XARELTO) 20 MG TABS tablet Take 1 tablet (20 mg total) by mouth daily with supper. 30 tablet 3  . rOPINIRole (REQUIP) 1 MG tablet TAKE 2 TABLETS BY MOUTH AT BEDTIME (Patient taking differently: TAKE 2 MG BY MOUTH AT BEDTIME) 60 tablet 0  . tiZANidine (ZANAFLEX) 4 MG tablet Take 4 mg by mouth 4 (four) times daily as needed for muscle spasms.   12  . torsemide (DEMADEX) 20 MG tablet Take 2 tablets (40 mg total) by mouth 2 (two) times daily. 60 tablet 5  . traZODone (DESYREL) 50 MG tablet Take 50 mg by mouth at bedtime.  5  . triamcinolone ointment (KENALOG) 0.1 % APPLY TO AFFECTED AREA  TWICE A DAY 30 g 0   No current facility-administered medications for this visit.     Functional Status: In your present state of health, do you have any difficulty performing the following activities: 01/18/2018 01/02/2018  Hearing? Y N  Vision? N N  Difficulty concentrating or making decisions? N N  Walking or climbing stairs? N Y  Dressing or bathing? N N  Doing errands, shopping? Y N  Preparing Food and eating ? - N  Using the Toilet? - N  In the past six months, have you accidently leaked urine? - Y  Do you have problems with loss of bowel control? - N  Managing  your Medications? - N  Managing your Finances? - Y  Housekeeping or managing your Housekeeping? - Y  Some recent data might be hidden    Fall/Depression Screening: Fall Risk  01/02/2018 09/17/2014 07/31/2014  Falls in the past year? No No No  Risk for fall due to : (No Data) - -  Risk for fall due to: Comment None, based on patient reporting today - -   PHQ 2/9 Scores 09/17/2014 06/18/2014  PHQ - 2 Score 0 0   ASSESSMENT: Date Discharged from Hospital: 01/24/18 Date Medication Reconciliation Performed: 01/25/2018  Medications Discontinued at Discharge:  furosemide  New Medications at Discharge:   torsemide  Patient was recently discharged from hospital and all medications have been reviewed  Drugs sorted by system:  Neurologic/Psychologic: clonazepam, ropinirole tizanidine, trazodone  Cardiovascular: atorvastatin, nitroglycerine, potassium chloride, rivaroxaban, torsemide  Topical: diclofenac sodium, lidocaine/prilocaine, triamcinolone cream  Pain: oxycodone/acetaminophen   Vitamins/Minerals: MVI  Miscellaneous: alfuzosin, finasertide  Gaps in therapy:  Patient with systolic heart failure with  borderline  EF 40-45% and h/o STEMI, consider adding ACEI and beta blocker.  Note previously sent to Jory Sims, NP cardiology, who will assess need at next visit.   Medications to avoid in the elderly. Per the [place] list, TCAs like trazodone have increased risk for falls and fractures. There is strong evidence to avoid in the elderly.  Other issues noted:  Patient went into the hospital on alfuzosin and was changed to tamsulosin as inpatient formulary agent.  His discharge medication list was for tamsulosin.  [place] had previously clarified with [name] (01/10/18) that patient was to take alfusozin and finasteride for prostate.  Patient confirmed that he had alfusozin at home.  Updated his medication list to reflect this change.   Medication Management: [name] [name] stressed the importance of logging his weight daily and notify Dr.Hawkins in order to prevent hospital readmission.   Informed him to always use the same scale for accuracy.  Reviewed the importance of taking his torsemide as prescribed.  He was able to correctly tell me how he is supposed to take his torsemide and he reports that he has picked up his prescription today.  He had take furosemide prior to picking up the prescription.   Offered to call [name]' office to help schedule his appointment, but he declined and said he would do it himself.   Plan:  Outreach to patient Monday morning 6/3 to see if he is taking his torsemide correctly, and checking daily weights.

## 2018-01-25 NOTE — Patient Outreach (Signed)
[facility]) [place] Telephone Outreach PCP office completes transition of care outreach post-hospital discharge Hospital discharge day 1 01/25/2018  [name] [name] 08-04-1941 [id number]  Received voice mail message from patient, requesting call back; returned patient's call within 20 minutes of receiving his voice message.  Successful telephone outreach to [name] [name], 77 y/o male referred to [place] afterrecent hospitalization April 29-Dec 30, 2017 for shortness of breath, significant bilateral lower extremity edema, weight gain of 30-plus pounds, CHF exacerbation. Patientwas discharged home to self-care after refusing home health services. Patient has history including, but not limited to, CAD with previous MI/ stenting; ischemic cardiomyopathy; sCHF; Atrial fibrillation; OSA on CPAP; BPH; and obesity.Unfortunately, patient experienced hospital re-admission May 22-28, 2019 for CHF exacerbation with weight gain, SOB, and lower extremity swelling.  Patient was again discharged home to self-care without home health services in place.  HIPAA/ identity verified with patient during phone call today.  Today, patient reports that he "is doing much better" after his recent hospitalization, and he denies pain, new/ recent falls.  Patient sounds to be in no obvious distress throughout phone call today.  Patient further reports:  -- has all medications and is taking as prescribed; declines medication review today, stating that he is "sitting down, not near his medications."  Reports that he obtained discharge instructions, and patient attempted to locate these, as he said they were 'right by him;" however, patient was unable to locate printed discharge instructions during our call.  Patient verbalized that he "understood" all medication changes made at time of hospital discharge yesterday, however, when I inquired as to how he is to take his  diuretic, he provided incorrect report, stating that he was told to take torsemide "40 mg in the morning, and 20 mg in the evening."  I instructed patient that this was not correct and, using teach back method, clarified for him discharge instructions to take torsemide 40 mg BID-- for a daily total of 80 mg; eventually, patient was able to accurately re-verbalize discharge instructions for diuretic dosing.  Discussed with patient that he should expect a call from [facility] later today, and encouraged him to listen for/ and to accept this phone call, and to have his medications nearby for review; patient verbalized understanding and agreement, and stated he would do.  -- No home health services ordered for nursing post-hospital discharge; stated that he 'does not need" a nurse coming to visit him on a "regular basis."  Confirmed that DME for home O2 had been delivered, and that he was instructed by home health staff in use of home O2; denies questions/ concerns around newly prescribed home O2.  -- continues to deny needs for community resources; reports to continue driving self to appointments/ errands; continues reporting strong family support through his daughter, whom he resides with  -- upcoming provider appointments were reviewed with patient today; patient accurately reports 2 provider appointments "next week," cardiology office visit on 01/30/18 and vascular surgeon consult on 02/01/18-- agian states he plans to transport self to these appointments.  Patient states he has not yet scheduled post-most recent hospital discharge follow up appointment with his PCP-- I encouraged patient to schedule this appointment promptly, and he agreed to do so  Self-health management ofchronic disease state of CHF: --provided positive reinforcement for patient starting to monitor/recorddaily weights prior to his most recent hospitalization, and encouraged him to continue doing so; patient stated he  planned to continue this  practice.   -- reports weight yesterday at hospital 305 lbs prior to discharge home; reports weight this morning of 296 lbs -- reiterated with patient previously provided education around value of daily weight monitoring/ recording; again reviewed weight gain guidelines in setting of CHF, to promptly report weight gain of > 3 lbs overnight and 5 lbs in one week.  Encouraged patient to take all recorded weights to provider office visits with him for review -- discussed/ reiterated previously provided education around signs/ symptoms yellow zone for CHF-- encouraged patient to re-review printed educational material that had been previously provided to him -- discussed with patient newly prescribed home O2 and encouraged him to promptly contact care providers if he has any concerns arise from use of home O2; instructed patient that he should not increase O2 dosing from prescribed 2 l/min without first notifying his care providers, and discussed that use of home O2 should be treated as a medication.  -- continues using CPAP at night "every night," now "along with the O2." -- signs/ symptoms that would necessitate patient contacting EMS/ going emergently to ED were reiterated with him, and he verbalizes a good understanding of same   Patient denies further issues, concerns, or problems today. Iconfirmed thatpatienthasmy direct phone number, the main [facility] CM office phone number, and the [facility] CM 24-hour nurse advicelinephone number should issues arise prior to next scheduled [place] outreach. Encouraged himto contact me directly if needs, questions, issues, or concerns arise prior to next scheduled outreach by telephone next week after his scheduled provider office visits; patient agreed to do so.Wescheduled and confirmed[facility] CM initial home visit for next month.  Plan:  Patient will take medications as prescribed and will attend all scheduled provider  appointments post-hospital discharge  Patient will promptly schedule PCP office visit for post-hospital discharge evaluation and medication review  Patient will promptly notify care providers for any new concerns/ issues/ problems that arise post-hospital discharge  Patient will continue monitoring and recording daily weights, and will report weight gain of > 3 lbs overnight/ 5 lbs in one week, to care providers  Patient will communicate with [facility] Pharmacist regarding medication management/ need for medication review, assistance  [facility] Community CM outreach to continue withscheduled telephone outreach next week  [facility] CM Care Plan Problem One     Most Recent Value  Care Plan Problem One  Risk for hospital readmission, as evidenced by 2 recent hospitalizations April 29-Dec 30, 2017 and May 22-28, 2019  for CHF exacerbation [Problem re-established after hospital discharge 01/24/18]  Role Documenting the Problem One  Care Management [place] for Problem One  Active  [facility] Long Term Goal   Over the next 31 days, patient will not experience hospital readmission, as evidenced by patient reporting and review of EMR during [place] RN CM outreach  [facility] Long Term Goal Start Date  01/25/18 - goal re-established today  Interventions for Problem One Long Term Goal  Discussed recent hospital re-admission with patient,  discussed discharge instructions and using teach back method, clarified new dosing instructions for prescribed diuretic,  encouraged patient to listen for phone call from [facility] Pharmacist later today  [facility] CM Short Term Goal #1   Over the next 7 days, patient will schedule a hospital discharge follow up appointment with his PCP, as evidenced by patient reporting and collaboration with PCP team as indicated, during [place] RN CM outreach  [facility] CM Short Term Goal #1 Start Date  01/25/18  Interventions for Short Term Goal #1  Discussed value of prompt PCP follow up after  hospitalization,  Encouraged patient to promptly schedule post-hospital discharge appointment with his PCP    [facility] CM Care Plan Problem Two     Most Recent Value  Care Plan Problem Two  Knoweldge deficit around self-health management of chronic disease state of CHF, as evidenced by patient reporting of same  Role Documenting the Problem Two  Care Management [place] for Problem Two  Active  Interventions for Problem Two Long Term Goal   Discussed with patient current clinical status, newly prescribed home O2/ use of same,  scheduled [facility] Community RN CM initial home visit for next month around patient's preference, and encouraged patient to re-review the printed EMMI educational material I had left at his home at the time of previously scheduled home visit where patient was no-show  [facility] Long Term Goal  Over the next 60 days, patient will verbalize signs and symptoms of yellow zone along with corresponding action plan, as evidenced by patient reporting during [place] outreach  [place] Term Goal Start Date  01/02/18  [facility] CM Short Term Goal #1   Over the next 30 days, patient will monitor and record daily weights, as evidenced by review of same and patient reporting during [place] outreach  [facility] CM Short Term Goal #1 Start Date  01/02/18  Interventions for Short Term Goal #2   Re-iterated weight gain guidelines with patient, and provided positive reinforcement for monitoring and recording daily weights since he now has a working scale,  reviewed weight gain guidelines to promptly report weight gain of > 3 lbs overnight/ 5 lbs in one week, to his care providers     Oneta Rack, RN, BSN, [place] Coordinator [facility] Care Management  [phone]

## 2018-01-25 NOTE — Progress Notes (Signed)
Cardiology Office Note    Date:  01/30/2018   ID:  HAYVEN [name], DOB 1941/03/17, MRN [id number]  PCP:  System, Pcp Not In  Cardiologist: Pixie Casino, MD wants to be followed in [place] : Dr. Bronson Ing in 1 month  Chief Complaint  Patient presents with  . Follow-up    History of Present Illness:  [name] [name] is a 77 y.o. male with history of CAD status post STEMI 09/2014 treated with DES to the LAD, chronic systolic and diastolic CHF, ischemic cardiomyopathy EF 40 to 45% on echo 11/2017, PAF on Eliquis, lymphedema, hypertension, HLD, COPD, MR is severe on echo, OSA.  Patient was discharged from [facility] 12/30/2017 after admission with acute CHF with 30 pound weight gain. secondary to medical noncompliance and not taking his Lasix for 4 weeks due to frequent urination and incontinence.  He diuresed 20 L in the hospital weight went from 330 pounds to 298.  Blood pressure had been low in the hospital and he was switched from IV diuresis to torsemide.  Also found to have a 2.7 cm spiculated nodule mid left upper lobe concerning for lung cancer followed by [name].  2D echo LVEF 40 to 45% with high ventricular filling pressures and wall motion abnormalities as described below.  Discharge weight 301  Patient was readmitted to [facility] 01/18/2018 with acute on chronic CHF secondary to medical noncompliance and grained 25 pounds.  Patient switched from torsemide back to Lasix because he thought it worked better but it did not.  He was discharged 01/24/18 on torsemide 40 mg twice daily and had lost 35 pounds.  Discharge weight 301 pounds.   Patient comes in today alone.  He left his oxygen in the car and his O2 sats were 78% when he got back to the examining room but it quickly came up in the high 80s after sitting down.  He says his weights have been stable at 298 pounds at home.  He is living with his daughter who does all the cooking.  He says he does not add any salt  but he is not sure what he is getting in the food.  He weighs 313 pounds on our scales.   Past Medical History:  Diagnosis Date  . Anxiety    situational  . Bilateral lower extremity edema   . CAD (coronary artery disease) 10/23/2014   a. s/p STEMI in 09/2014 with DES to LAD  . Cancer (HCC)    Mantle cell lymphoma  . Dyspnea    with exertion  . ED (erectile dysfunction)   . Hyperlipidemia   . Hypertension   . Insomnia   . Ischemic cardiomyopathy 10/24/14   EF 40-45% by echo  . Obesity    morbid  . OSA (obstructive sleep apnea)    severe, on CPAP 12  . Osteoarthritis   . Peripheral edema   . Persistent atrial fibrillation ([place]) 05/16/2017  . Restless leg syndrome   . Rhinitis   . Tobacco abuse     Past Surgical History:  Procedure Laterality Date  . ANTERIOR LAT LUMBAR FUSION N/A 07/02/2016   Procedure: LUMBAR THREE - FOUR ANTERIOR LATERAL LUMBAR FUSION WITH PERCUTANEOUS SCREWS LEFT;  Surgeon: Earnie Larsson, MD;  Location: [place];  Service: Neurosurgery;  Laterality: N/A;  . BACK SURGERY  10/2009  . CORONARY ANGIOPLASTY    . EYE SURGERY     lazer bil  . HEMORRHOID SURGERY    .  INTRA-AORTIC BALLOON PUMP INSERTION  10/23/2014   Procedure: INTRA-AORTIC BALLOON PUMP INSERTION;  Surgeon: Peter M Martinique, MD;  Location: [facility] CATH LAB;  Service: Cardiovascular;;  . LEFT HEART CATHETERIZATION WITH CORONARY ANGIOGRAM N/A 10/23/2014   Procedure: LEFT HEART CATHETERIZATION WITH CORONARY ANGIOGRAM;  Surgeon: Peter M Martinique, MD;  Location: [facility] CATH LAB;  Service: Cardiovascular;  Laterality: N/A;  . LYMPH NODE BIOPSY Right 01/2014   groin area  . PERCUTANEOUS CORONARY STENT INTERVENTION (PCI-S)  10/23/2014   Procedure: PERCUTANEOUS CORONARY STENT INTERVENTION (PCI-S);  Surgeon: Peter M Martinique, MD;  Location: [facility] CATH LAB;  Service: Cardiovascular;;  . PORTACATH PLACEMENT Right 2015  . ROTATOR CUFF REPAIR  2011  . TOTAL KNEE ARTHROPLASTY     left  . TOTAL KNEE ARTHROPLASTY  08/04/2012   right  knee  . TOTAL KNEE ARTHROPLASTY  08/04/2012   Procedure: TOTAL KNEE ARTHROPLASTY;  Surgeon: Yvette Rack., MD;  Location: [place];  Service: Orthopedics;  Laterality: Right;  WITH PATELLA RESURFACING    Current Medications: Current Meds  Medication Sig  . alfuzosin (UROXATRAL) 10 MG 24 hr tablet Take 10 mg by mouth daily with breakfast.  . atorvastatin (LIPITOR) 40 MG tablet Take 1 tablet (40 mg total) by mouth daily at 6 PM.  . clonazePAM (KLONOPIN) 1 MG tablet Take 2 tablets (2 mg total) by mouth at bedtime.  . diclofenac sodium (VOLTAREN) 1 % GEL Apply 4 g topically 4 (four) times daily as needed (for leg soreness). (Patient taking differently: Apply 4 g topically 4 (four) times daily as needed (for leg soreness-mostly uses at bedtime). )  . finasteride (PROSCAR) 5 MG tablet Take 1 tablet (5 mg total) by mouth daily.  Marland Kitchen lidocaine-prilocaine (EMLA) cream Apply 1 application topically 2 (two) times daily as needed. Applies to ankle area when skin splits from edema.  . Multiple Vitamin (MULTIVITAMIN WITH MINERALS) TABS tablet Take 1 tablet by mouth daily.  . nitroGLYCERIN (NITROSTAT) 0.4 MG SL tablet Place 1 tablet (0.4 mg total) under the tongue every 5 (five) minutes as needed for chest pain (Max 3 doses within 15 minutes. Call 911 at 3rd dose). (Patient taking differently: Place 0.4 mg under the tongue every 5 (five) minutes as needed for chest pain (Max 3 doses within 15 minutes. Call 911 at 3rd dose). Uses very infrequently.)  . oxyCODONE-acetaminophen (PERCOCET) 10-325 MG tablet Take 1 tablet by mouth every 4 (four) hours as needed for pain.   . rivaroxaban (XARELTO) 20 MG TABS tablet Take 1 tablet (20 mg total) by mouth daily with supper.  Marland Kitchen rOPINIRole (REQUIP) 1 MG tablet TAKE 2 TABLETS BY MOUTH AT BEDTIME (Patient taking differently: TAKE 2 MG BY MOUTH AT BEDTIME)  . tiZANidine (ZANAFLEX) 4 MG tablet Take 4 mg by mouth 4 (four) times daily as needed for muscle spasms.   . traZODone  (DESYREL) 50 MG tablet Take 50 mg by mouth at bedtime.  . triamcinolone ointment (KENALOG) 0.1 % APPLY TO AFFECTED AREA TWICE A DAY  . [DISCONTINUED] potassium chloride SA (K-DUR,KLOR-CON) 20 MEQ tablet Take 1 tablet (20 mEq total) by mouth 2 (two) times daily.  . [DISCONTINUED] torsemide (DEMADEX) 20 MG tablet Take 2 tablets (40 mg total) by mouth 2 (two) times daily.     Allergies:   Patient has no known allergies.   Social History   Socioeconomic History  . Marital status: Widowed    Spouse name: Not on file  . Number of children: 2  .  Years of education: Not on file  . Highest education level: Not on file  Occupational History  . Occupation: Disabled, Retired Dealer  . Occupation: RETIRED    Employer: RETIRED  Social Needs  . Financial resource strain: Not on file  . Food insecurity:    Worry: Not on file    Inability: Not on file  . Transportation needs:    Medical: Not on file    Non-medical: Not on file  Tobacco Use  . Smoking status: Former Smoker    Packs/day: 0.10    Years: 55.00    Pack years: 5.50    Types: Cigarettes    Last attempt to quit: 09/18/2017    Years since quitting: 0.3  . Smokeless tobacco: Never Used  . Tobacco comment: <1 ppd  Substance and Sexual Activity  . Alcohol use: No  . Drug use: No  . Sexual activity: Not on file  Lifestyle  . Physical activity:    Days per week: Not on file    Minutes per session: Not on file  . Stress: Not on file  Relationships  . Social connections:    Talks on phone: Not on file    Gets together: Not on file    Attends religious service: Not on file    Active member of club or organization: Not on file    Attends meetings of clubs or organizations: Not on file    Relationship status: Not on file  Other Topics Concern  . Not on file  Social History Narrative  . Not on file     Family History:  The patient's family history includes Early death in his father; Unexplained death (age of onset: 43) in  his mother.   ROS:   Please see the history of present illness.    Review of Systems  Constitution: Positive for malaise/fatigue.  Cardiovascular: Positive for dyspnea on exertion and leg swelling.  Musculoskeletal: Positive for muscle weakness.   All other systems reviewed and are negative.   PHYSICAL EXAM:   VS:  BP (!) 148/76   Pulse (!) 105   Ht 5\' 11"  (1.803 m)   Wt (!) 313 lb 12.8 oz (142.3 kg)   BMI 43.77 kg/m   Physical Exam  GEN: Obese, in no acute distress  Neck: no JVD, carotid bruits, or masses Cardiac: Irregular; distant heart sounds no murmurs, rubs, or gallops  Respiratory: Decreased breath sounds without rales GI: soft, nontender, nondistended, + BS Ext: Legs wrapped up to his knees, still has 3+ edema bilaterally  Neuro:  Alert and Oriented x 3 Psych: euthymic mood, full affect  Wt Readings from Last 3 Encounters:  01/30/18 (!) 313 lb 12.8 oz (142.3 kg)  01/24/18 (!) 301 lb 6.4 oz (136.7 kg)  12/28/17 298 lb 6.4 oz (135.4 kg)      Studies/Labs Reviewed:   EKG:  EKG is not ordered today.   Recent Labs: 01/18/2018: ALT 17; B Natriuretic Peptide 1,353.0; Hemoglobin 11.4; Platelets 129; TSH 1.607 01/22/2018: Magnesium 1.8 01/24/2018: BUN 27; Creatinine, Ser 1.17; Potassium 3.9; Sodium 136   Lipid Panel    Component Value Date/Time   CHOL 127 05/17/2017 0507   TRIG 38 05/17/2017 0507   HDL 27 (L) 05/17/2017 0507   CHOLHDL 4.7 05/17/2017 0507   VLDL 8 05/17/2017 0507   LDLCALC 92 05/17/2017 0507    Additional studies/ records that were reviewed today include:    Echocardiogram: 12/27/2017 Study Conclusions   - Left ventricle: The  cavity size was normal. Wall thickness was   increased in a pattern of mild LVH. Systolic function was mildly   to moderately reduced. The estimated ejection fraction was in the   range of 40% to 45%. The study was not technically sufficient to   allow evaluation of LV diastolic dysfunction due to atrial   fibrillation.  Doppler parameters are consistent with high   ventricular filling pressure. - Regional wall motion abnormality: Hypokinesis of the mid inferior   and mid inferolateral myocardium; mild hypokinesis of the apical   anterior myocardium. - Aortic valve: Mildly thickened, mildly calcified leaflets. - Mitral valve: Moderate eccentric regurgitation. Degree of   severity may be underestimated due to eccentricity of jet. - Left atrium: The atrium was severely dilated. - Right ventricle: The cavity size was mildly dilated. Systolic   function was moderately reduced. - Right atrium: The atrium was moderately dilated. - Tricuspid valve: There was mild regurgitation. - Inferior vena cava: The vessel was dilated. The respirophasic   diameter changes were blunted (< 50%), consistent with elevated   central venous pressure. Estimated CVP 15 mmHg.     ASSESSMENT:    1. Acute combined systolic and diastolic congestive heart failure ([place])   2. Medication management   3. CAD S/P LAD DES 10/23/14 in setting of STEMI with CGS   4. Persistent atrial fibrillation ([place])   5. Dyslipidemia   6. Morbid obesity due to excess calories ([place])   7. Mass of upper lobe of left lung      PLAN:  In order of problems listed above:  Acute combined systolic and diastolic CHF with significant edema. NYHA class IV. Weight 313 pounds on our scales.  He actually says his weights have been stable at home.  Will increase torsemide to 60 mg twice daily, increase potassium 2 tablets twice daily, have home health draw BMET in 2 weeks.  2 g sodium diet given  Medical management checking labs as adjusting medications for heart failure  CAD status post DES to the LAD in 2016 in the setting of a STEMI with cardiogenic shock.  No further chest pain  Persistent atrial fibrillation on Xarelto  Dyslipidemia on Lipitor  Morbid obesity weight loss would help his overall health  Masses in the upper lobe of the left lung to be  evaluated by pulmonary     Medication Adjustments/Labs and Tests Ordered: Current medicines are reviewed at length with the patient today.  Concerns regarding medicines are outlined above.  Medication changes, Labs and Tests ordered today are listed in the Patient Instructions below. Patient Instructions  Medication Instructions:  Your physician has recommended you make the following change in your medication: Increase Torsemide to 60 mg Two Times Daily  Increase Potassium to 40 mg Two Times Daily    Labwork: Your physician recommends that you return for lab work in: To be done by [facility]     Testing/Procedures: NONE   Follow-Up: Your physician recommends that you schedule a follow-up appointment with Dr. Bronson Ing.    Any Other Special Instructions Will Be Listed Below (If Applicable).     If you need a refill on your cardiac medications before your next appointment, please call your pharmacy. Thank you for choosing [facility]!  s Low-Sodium Eating Plan Sodium, which is an element that makes up salt, helps you maintain a healthy balance of fluids in your body. Too much sodium can increase your blood pressure and cause fluid and waste to  be held in your body. Your health care provider or dietitian may recommend following this plan if you have high blood pressure (hypertension), kidney disease, liver disease, or heart failure. Eating less sodium can help lower your blood pressure, reduce swelling, and protect your heart, liver, and kidneys. What are tips for following this plan? General guidelines  Most people on this plan should limit their sodium intake to 1,500-2,000 mg (milligrams) of sodium each day. Reading food labels  The Nutrition Facts label lists the amount of sodium in one serving of the food. If you eat more than one serving, you must multiply the listed amount of sodium by the number of servings.  Choose foods with less than 140 mg of sodium per  serving.  Avoid foods with 300 mg of sodium or more per serving. Shopping  Look for lower-sodium products, often labeled as "low-sodium" or "no salt added."  Always check the sodium content even if foods are labeled as "unsalted" or "no salt added".  Buy fresh foods. ? Avoid canned foods and premade or frozen meals. ? Avoid canned, cured, or processed meats  Buy breads that have less than 80 mg of sodium per slice. Cooking  Eat more home-cooked food and less restaurant, buffet, and fast food.  Avoid adding salt when cooking. Use salt-free seasonings or herbs instead of table salt or sea salt. Check with your health care provider or pharmacist before using salt substitutes.  Cook with plant-based oils, such as canola, sunflower, or olive oil. Meal planning  When eating at a restaurant, ask that your food be prepared with less salt or no salt, if possible.  Avoid foods that contain MSG (monosodium glutamate). MSG is sometimes added to [place] food, bouillon, and some canned foods. What foods are recommended? The items listed may not be a complete list. Talk with your dietitian about what dietary choices are best for you. Grains Low-sodium cereals, including oats, puffed wheat and rice, and shredded wheat. Low-sodium crackers. Unsalted rice. Unsalted pasta. Low-sodium bread. Whole-grain breads and whole-grain pasta. Vegetables Fresh or frozen vegetables. "No salt added" canned vegetables. "No salt added" tomato sauce and paste. Low-sodium or reduced-sodium tomato and vegetable juice. Fruits Fresh, frozen, or canned fruit. Fruit juice. Meats and other protein foods Fresh or frozen (no salt added) meat, poultry, seafood, and fish. Low-sodium canned tuna and salmon. Unsalted nuts. Dried peas, beans, and lentils without added salt. Unsalted canned beans. Eggs. Unsalted nut butters. Dairy Milk. Soy milk. Cheese that is naturally low in sodium, such as ricotta cheese, fresh mozzarella, or  Swiss cheese Low-sodium or reduced-sodium cheese. Cream cheese. Yogurt. Fats and oils Unsalted butter. Unsalted margarine with no trans fat. Vegetable oils such as canola or olive oils. Seasonings and other foods Fresh and dried herbs and spices. Salt-free seasonings. Low-sodium mustard and ketchup. Sodium-free salad dressing. Sodium-free light mayonnaise. Fresh or refrigerated horseradish. Lemon juice. Vinegar. Homemade, reduced-sodium, or low-sodium soups. Unsalted popcorn and pretzels. Low-salt or salt-free chips. What foods are not recommended? The items listed may not be a complete list. Talk with your dietitian about what dietary choices are best for you. Grains Instant hot cereals. Bread stuffing, pancake, and biscuit mixes. Croutons. Seasoned rice or pasta mixes. Noodle soup cups. Boxed or frozen macaroni and cheese. Regular salted crackers. Self-rising flour. Vegetables Sauerkraut, pickled vegetables, and relishes. Olives. [place] fries. Onion rings. Regular canned vegetables (not low-sodium or reduced-sodium). Regular canned tomato sauce and paste (not low-sodium or reduced-sodium). Regular tomato and vegetable juice (not  low-sodium or reduced-sodium). Frozen vegetables in sauces. Meats and other protein foods Meat or fish that is salted, canned, smoked, spiced, or pickled. Bacon, ham, sausage, hotdogs, corned beef, chipped beef, packaged lunch meats, salt pork, jerky, pickled herring, anchovies, regular canned tuna, sardines, salted nuts. Dairy Processed cheese and cheese spreads. Cheese curds. Blue cheese. Feta cheese. String cheese. Regular cottage cheese. Buttermilk. Canned milk. Fats and oils Salted butter. Regular margarine. Ghee. Bacon fat. Seasonings and other foods Onion salt, garlic salt, seasoned salt, table salt, and sea salt. Canned and packaged gravies. Worcestershire sauce. Tartar sauce. Barbecue sauce. Teriyaki sauce. Soy sauce, including reduced-sodium. Steak sauce. Fish  sauce. Oyster sauce. Cocktail sauce. Horseradish that you find on the shelf. Regular ketchup and mustard. Meat flavorings and tenderizers. Bouillon cubes. Hot sauce and Tabasco sauce. Premade or packaged marinades. Premade or packaged taco seasonings. Relishes. Regular salad dressings. Salsa. Potato and tortilla chips. Corn chips and puffs. Salted popcorn and pretzels. Canned or dried soups. Pizza. Frozen entrees and pot pies. Summary  Eating less sodium can help lower your blood pressure, reduce swelling, and protect your heart, liver, and kidneys.  Most people on this plan should limit their sodium intake to 1,500-2,000 mg (milligrams) of sodium each day.  Canned, boxed, and frozen foods are high in sodium. Restaurant foods, fast foods, and pizza are also very high in sodium. You also get sodium by adding salt to food.  Try to cook at home, eat more fresh fruits and vegetables, and eat less fast food, canned, processed, or prepared foods. This information is not intended to replace advice given to you by your health care provider. Make sure you discuss any questions you have with your health care provider. Document Released: 02/05/2002 Document Revised: 08/09/2016 Document Reviewed: 08/09/2016 Elsevier Interactive Patient Education  2018 [place], Ermalinda Barrios, [place]  01/30/2018 12:31 PM    [facility] [address], [place], [place]  [address] Phone: [phone]; Fax: [phone]

## 2018-01-26 ENCOUNTER — Other Ambulatory Visit (HOSPITAL_COMMUNITY): Payer: Self-pay | Admitting: Respiratory Therapy

## 2018-01-26 DIAGNOSIS — R0602 Shortness of breath: Secondary | ICD-10-CM

## 2018-01-30 ENCOUNTER — Encounter: Payer: Self-pay | Admitting: Physician Assistant

## 2018-01-30 ENCOUNTER — Other Ambulatory Visit: Payer: Self-pay | Admitting: *Deleted

## 2018-01-30 ENCOUNTER — Other Ambulatory Visit: Payer: Self-pay

## 2018-01-30 ENCOUNTER — Ambulatory Visit (INDEPENDENT_AMBULATORY_CARE_PROVIDER_SITE_OTHER): Payer: Medicare Other | Admitting: Physician Assistant

## 2018-01-30 ENCOUNTER — Telehealth: Payer: Self-pay | Admitting: Physician Assistant

## 2018-01-30 VITALS — BP 148/76 | HR 105 | Ht 71.0 in | Wt 313.8 lb

## 2018-01-30 DIAGNOSIS — I481 Persistent atrial fibrillation: Secondary | ICD-10-CM

## 2018-01-30 DIAGNOSIS — I5041 Acute combined systolic (congestive) and diastolic (congestive) heart failure: Secondary | ICD-10-CM | POA: Diagnosis not present

## 2018-01-30 DIAGNOSIS — I2510 Atherosclerotic heart disease of native coronary artery without angina pectoris: Secondary | ICD-10-CM | POA: Diagnosis not present

## 2018-01-30 DIAGNOSIS — Z79899 Other long term (current) drug therapy: Secondary | ICD-10-CM | POA: Diagnosis not present

## 2018-01-30 DIAGNOSIS — Z9861 Coronary angioplasty status: Secondary | ICD-10-CM

## 2018-01-30 DIAGNOSIS — R918 Other nonspecific abnormal finding of lung field: Secondary | ICD-10-CM

## 2018-01-30 DIAGNOSIS — E785 Hyperlipidemia, unspecified: Secondary | ICD-10-CM

## 2018-01-30 DIAGNOSIS — I4819 Other persistent atrial fibrillation: Secondary | ICD-10-CM

## 2018-01-30 MED ORDER — POTASSIUM CHLORIDE CRYS ER 20 MEQ PO TBCR: 40.0000 meq | EXTENDED_RELEASE_TABLET | Freq: Two times a day (BID) | ORAL | 3 refills | Status: AC

## 2018-01-30 MED ORDER — TORSEMIDE 20 MG PO TABS
60.0000 mg | ORAL_TABLET | Freq: Two times a day (BID) | ORAL | 3 refills | Status: AC
Start: 2018-01-30 — End: 2018-04-30

## 2018-01-30 MED ORDER — TORSEMIDE 20 MG PO TABS: 20.0000 mg | ORAL_TABLET | Freq: Two times a day (BID) | ORAL | 3 refills | Status: DC

## 2018-01-30 NOTE — Patient Instructions (Addendum)
Medication Instructions:  Your physician has recommended you make the following change in your medication: Increase Torsemide to 60 mg Two Times Daily  Increase Potassium to 40 mg Two Times Daily    Labwork: Your physician recommends that you return for lab work in: To be done by [facility]     Testing/Procedures: NONE   Follow-Up: Your physician recommends that you schedule a follow-up appointment with Dr. Bronson Ing.    Any Other Special Instructions Will Be Listed Below (If Applicable).     If you need a refill on your cardiac medications before your next appointment, please call your pharmacy. Thank you for choosing [facility]!  s Low-Sodium Eating Plan Sodium, which is an element that makes up salt, helps you maintain a healthy balance of fluids in your body. Too much sodium can increase your blood pressure and cause fluid and waste to be held in your body. Your health care provider or dietitian may recommend following this plan if you have high blood pressure (hypertension), kidney disease, liver disease, or heart failure. Eating less sodium can help lower your blood pressure, reduce swelling, and protect your heart, liver, and kidneys. What are tips for following this plan? General guidelines  Most people on this plan should limit their sodium intake to 1,500-2,000 mg (milligrams) of sodium each day. Reading food labels  The Nutrition Facts label lists the amount of sodium in one serving of the food. If you eat more than one serving, you must multiply the listed amount of sodium by the number of servings.  Choose foods with less than 140 mg of sodium per serving.  Avoid foods with 300 mg of sodium or more per serving. Shopping  Look for lower-sodium products, often labeled as "low-sodium" or "no salt added."  Always check the sodium content even if foods are labeled as "unsalted" or "no salt added".  Buy fresh foods. ? Avoid canned foods and premade or frozen  meals. ? Avoid canned, cured, or processed meats  Buy breads that have less than 80 mg of sodium per slice. Cooking  Eat more home-cooked food and less restaurant, buffet, and fast food.  Avoid adding salt when cooking. Use salt-free seasonings or herbs instead of table salt or sea salt. Check with your health care provider or pharmacist before using salt substitutes.  Cook with plant-based oils, such as canola, sunflower, or olive oil. Meal planning  When eating at a restaurant, ask that your food be prepared with less salt or no salt, if possible.  Avoid foods that contain MSG (monosodium glutamate). MSG is sometimes added to [place] food, bouillon, and some canned foods. What foods are recommended? The items listed may not be a complete list. Talk with your dietitian about what dietary choices are best for you. Grains Low-sodium cereals, including oats, puffed wheat and rice, and shredded wheat. Low-sodium crackers. Unsalted rice. Unsalted pasta. Low-sodium bread. Whole-grain breads and whole-grain pasta. Vegetables Fresh or frozen vegetables. "No salt added" canned vegetables. "No salt added" tomato sauce and paste. Low-sodium or reduced-sodium tomato and vegetable juice. Fruits Fresh, frozen, or canned fruit. Fruit juice. Meats and other protein foods Fresh or frozen (no salt added) meat, poultry, seafood, and fish. Low-sodium canned tuna and salmon. Unsalted nuts. Dried peas, beans, and lentils without added salt. Unsalted canned beans. Eggs. Unsalted nut butters. Dairy Milk. Soy milk. Cheese that is naturally low in sodium, such as ricotta cheese, fresh mozzarella, or Swiss cheese Low-sodium or reduced-sodium cheese. Cream cheese. Yogurt.  Fats and oils Unsalted butter. Unsalted margarine with no trans fat. Vegetable oils such as canola or olive oils. Seasonings and other foods Fresh and dried herbs and spices. Salt-free seasonings. Low-sodium mustard and ketchup. Sodium-free  salad dressing. Sodium-free light mayonnaise. Fresh or refrigerated horseradish. Lemon juice. Vinegar. Homemade, reduced-sodium, or low-sodium soups. Unsalted popcorn and pretzels. Low-salt or salt-free chips. What foods are not recommended? The items listed may not be a complete list. Talk with your dietitian about what dietary choices are best for you. Grains Instant hot cereals. Bread stuffing, pancake, and biscuit mixes. Croutons. Seasoned rice or pasta mixes. Noodle soup cups. Boxed or frozen macaroni and cheese. Regular salted crackers. Self-rising flour. Vegetables Sauerkraut, pickled vegetables, and relishes. Olives. [place] fries. Onion rings. Regular canned vegetables (not low-sodium or reduced-sodium). Regular canned tomato sauce and paste (not low-sodium or reduced-sodium). Regular tomato and vegetable juice (not low-sodium or reduced-sodium). Frozen vegetables in sauces. Meats and other protein foods Meat or fish that is salted, canned, smoked, spiced, or pickled. Bacon, ham, sausage, hotdogs, corned beef, chipped beef, packaged lunch meats, salt pork, jerky, pickled herring, anchovies, regular canned tuna, sardines, salted nuts. Dairy Processed cheese and cheese spreads. Cheese curds. Blue cheese. Feta cheese. String cheese. Regular cottage cheese. Buttermilk. Canned milk. Fats and oils Salted butter. Regular margarine. Ghee. Bacon fat. Seasonings and other foods Onion salt, garlic salt, seasoned salt, table salt, and sea salt. Canned and packaged gravies. Worcestershire sauce. Tartar sauce. Barbecue sauce. Teriyaki sauce. Soy sauce, including reduced-sodium. Steak sauce. Fish sauce. Oyster sauce. Cocktail sauce. Horseradish that you find on the shelf. Regular ketchup and mustard. Meat flavorings and tenderizers. Bouillon cubes. Hot sauce and Tabasco sauce. Premade or packaged marinades. Premade or packaged taco seasonings. Relishes. Regular salad dressings. Salsa. Potato and tortilla  chips. Corn chips and puffs. Salted popcorn and pretzels. Canned or dried soups. Pizza. Frozen entrees and pot pies. Summary  Eating less sodium can help lower your blood pressure, reduce swelling, and protect your heart, liver, and kidneys.  Most people on this plan should limit their sodium intake to 1,500-2,000 mg (milligrams) of sodium each day.  Canned, boxed, and frozen foods are high in sodium. Restaurant foods, fast foods, and pizza are also very high in sodium. You also get sodium by adding salt to food.  Try to cook at home, eat more fresh fruits and vegetables, and eat less fast food, canned, processed, or prepared foods. This information is not intended to replace advice given to you by your health care provider. Make sure you discuss any questions you have with your health care provider. Document Released: 02/05/2002 Document Revised: 08/09/2016 Document Reviewed: 08/09/2016 Elsevier Interactive Patient Education  [employer].

## 2018-01-30 NOTE — Patient Outreach (Signed)
[facility] [facility]) Care Management  01/30/2018  [name] [name] August 24, 1941 [id number]  Unsuccessful outreach attempt to [name] this morning.  Left HIPAA complaint voice message requesting return call.  Unsuccessful outreach attempt to [name] this afternoon.  Per cardiology note, his weight was up and they increased his torsemide to 60 mg twice daily and his potassium to 40 meq twice daily.  Plan: Outreach attempt tomorrow to make sure that he understands his new diuretic/potassium dosing.  Joetta Manners, PharmD Clinical Pharmacist [place] [phone]

## 2018-01-30 NOTE — Telephone Encounter (Signed)
Call returned from [facility]. [facility] does not do in home labs. Pt notified. And voiced understanding. Pt will have labs done at [facility].Pt informed of valet parking availble to patients.

## 2018-01-30 NOTE — Telephone Encounter (Signed)
Returned call to [facility] w/ [facility]. No answer, left msg to call back regarding labs.

## 2018-01-30 NOTE — Patient Outreach (Addendum)
[place] [facility]) Care Management [place] Telephone [facility] Coordination  01/30/2018  DAVELL BECKSTEAD 1941-02-10 [id number]  Unsuccessful telephone Outreach to [facility] Cardiology Linna Hoff [phone]) re:  hospitalization April 29-Dec 30, 2017 for shortness of breath, significant bilateral lower extremity edema, weight gain of 30-plus pounds, CHF exacerbation. Patientwas discharged home to self-care after refusing home health services. Patient has history including, but not limited to, CAD with previous MI/ stenting; ischemic cardiomyopathy; sCHF; Atrial fibrillation; OSA on CPAP; BPH; and obesity.Unfortunately, patient experienced hospital re-admission May 22-28, 2019 for CHF exacerbation with weight gain, SOB, and lower extremity swelling.  Patient was again discharged home to self-care without home health services in place.   Noted through review of EMR that patient attended office visit at cardiology provider office earlier today; in provider note, it was documented that provider ordered lab work to be drawn by home health; appeared that provider then noted that lab work was to be drawn by [facility] team; left secure voice mail message for staff clarifying that [facility] is not home health services and that any necessary follow lab work would need to be scheduled through provider office unless provider wishes to order home health services for patient.  Left my direct call back number for any questions  Addendum 4:40 pm:  Spoke with Varney Biles at Cardiology [facility]-- confirmed for her that [place] is not home health services and that it appears that patient does NOT currently have home health services in place post-most recent hospital discharge; Varney Biles states she will have patient obtain lab work as outpatient  Oneta Rack, RN, BSN, [place] Care Management  [phone]

## 2018-01-30 NOTE — Telephone Encounter (Signed)
[facility] coordinator w/ [facility] lvm stating that [facility] does not do labs anymore. Will need to have home health orders if they need to be drawn from home.

## 2018-01-31 ENCOUNTER — Ambulatory Visit: Payer: Self-pay

## 2018-01-31 ENCOUNTER — Other Ambulatory Visit: Payer: Self-pay

## 2018-01-31 NOTE — Patient Outreach (Signed)
[place] [facility]) Care Management  01/31/2018  JACKSTON OAXACA December 07, 1940 [id number]    Unsuccessful outreach attempt to Mr. Rhatigan this morning.  Left HIPAA complaint voice message requesting return call.  Unsuccessful outreach attempt to Mr. Robin this afternoon.  Left HIPAA compliant voice message requesting return call.  Plan: Outreach attempt next week.  Joetta Manners, PharmD Clinical Pharmacist [place] [phone]

## 2018-02-01 ENCOUNTER — Institutional Professional Consult (permissible substitution) (INDEPENDENT_AMBULATORY_CARE_PROVIDER_SITE_OTHER): Payer: Medicare Other | Admitting: Cardiothoracic Surgery

## 2018-02-01 ENCOUNTER — Encounter (HOSPITAL_COMMUNITY): Payer: Medicare Other

## 2018-02-01 ENCOUNTER — Other Ambulatory Visit: Payer: Self-pay | Admitting: *Deleted

## 2018-02-01 ENCOUNTER — Encounter: Payer: Self-pay | Admitting: Radiation Oncology

## 2018-02-01 VITALS — BP 111/69 | HR 79 | Resp 20 | Ht 71.0 in | Wt 312.0 lb

## 2018-02-01 DIAGNOSIS — R918 Other nonspecific abnormal finding of lung field: Secondary | ICD-10-CM

## 2018-02-01 NOTE — Progress Notes (Signed)
[address]       [place] [address]             [phone]                    [name] [name] Medical Record #[id number] Date of Birth: [birth year]  Referring: Sinda Du, MD Primary Care: System, Pcp Not In Primary Cardiologist: Pixie Casino, MD  Chief Complaint:    Chief Complaint  Patient presents with  . Lung Mass    Surgical eval, PFT's 01/26/18, PET Scan 01/10/18, Chest CT 12/27/17, ECHO 12/27/17    History of Present Illness:    [name] [name] 77 y.o. male is seen in the office  today for evaluation of a left upper lobe lung nodule.  Although not noted on the patient's CT scan from September 2018 the mass was present then proxy 1.7 cm in size.  Repeat scan again confirmed in April.  The patient has significant heart failure symptoms that are been poorly controlled.  He is been in the hospital twice in the last several months.  He is severely limited in his physical ability to get around due to massive leg swelling, shortness of breath, and on home oxygen constantly.    Patient other history includes has history including, but not limited to, CAD with previous MI/ stenting; ischemic cardiomyopathy; sCHF; Atrial fibrillation; OSA on CPAP; BPH; and obesity.Unfortunately, patient experienced hospital re-admission May 22-28, 2019 for CHF exacerbation with weight gain, SOB, and lower extremity swelling.   Surprisingly he drove himself to the office today but had difficulty even moving in the exam room without becoming short of breath.   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, initially biopsy from a needle biopsy of the right groin   Current Activity/ Functional Status:  Patient is independent with mobility/ambulation, transfers, ADL's, IADL's but with significant shortness of breath and difficulty   Zubrod Score: At the time of surgery this patient's most appropriate activity  status/level should be described as: []     0    Normal activity, no symptoms []     1    Restricted in physical strenuous activity but ambulatory, able to do out light work [x]     2    Ambulatory and capable of self care, unable to do work activities, up and about               >50 % of waking hours                              []     3    Only limited self care, in bed greater than 50% of waking hours []     4    Completely disabled, no self care, confined to bed or chair []     5    Moribund   Past Medical History:  Diagnosis Date  . Anxiety    situational  . Bilateral lower extremity edema   . CAD (coronary artery disease) 10/23/2014   a. s/p STEMI in 09/2014 with DES to LAD  . Cancer (HCC)    Mantle cell lymphoma  . Dyspnea    with exertion  . ED (erectile dysfunction)   . Hyperlipidemia   . Hypertension   . Insomnia   . Ischemic cardiomyopathy 10/24/14   EF 40-45% by echo  .  Obesity    morbid  . OSA (obstructive sleep apnea)    severe, on CPAP 12  . Osteoarthritis   . Peripheral edema   . Persistent atrial fibrillation ([place]) 05/16/2017  . Restless leg syndrome   . Rhinitis   . Tobacco abuse     Past Surgical History:  Procedure Laterality Date  . ANTERIOR LAT LUMBAR FUSION N/A 07/02/2016   Procedure: LUMBAR THREE - FOUR ANTERIOR LATERAL LUMBAR FUSION WITH PERCUTANEOUS SCREWS LEFT;  Surgeon: Earnie Larsson, MD;  Location: [facility];  Service: Neurosurgery;  Laterality: N/A;  . BACK SURGERY  10/2009  . CORONARY ANGIOPLASTY    . EYE SURGERY     lazer bil  . HEMORRHOID SURGERY    . INTRA-AORTIC BALLOON PUMP INSERTION  10/23/2014   Procedure: INTRA-AORTIC BALLOON PUMP INSERTION;  Surgeon: Peter M Martinique, MD;  Location: [facility] CATH LAB;  Service: Cardiovascular;;  . LEFT HEART CATHETERIZATION WITH CORONARY ANGIOGRAM N/A 10/23/2014   Procedure: LEFT HEART CATHETERIZATION WITH CORONARY ANGIOGRAM;  Surgeon: Peter M Martinique, MD;  Location: [facility] C[facility];  Service: Cardiovascular;  Laterality:  N/A;  . LYMPH NODE BIOPSY Right 01/2014   groin area  . PERCUTANEOUS CORONARY STENT INTERVENTION (PCI-S)  10/23/2014   Procedure: PERCUTANEOUS CORONARY STENT INTERVENTION (PCI-S);  Surgeon: Peter M Martinique, MD;  Location: [facility] CATH LAB;  Service: Cardiovascular;;  . PORTACATH PLACEMENT Right 2015  . ROTATOR CUFF REPAIR  2011  . TOTAL KNEE ARTHROPLASTY     left  . TOTAL KNEE ARTHROPLASTY  08/04/2012   right knee  . TOTAL KNEE ARTHROPLASTY  08/04/2012   Procedure: TOTAL KNEE ARTHROPLASTY;  Surgeon: Yvette Rack., MD;  Location: [facility];  Service: Orthopedics;  Laterality: Right;  WITH PATELLA RESURFACING    Family History  Problem Relation Age of Onset  . Unexplained death Mother 4       Considered natural causes, no autopsy  . Early death Father        Killed In World War II  . Coronary artery disease Neg Hx      Social History   Tobacco Use  Smoking Status Former Smoker  . Packs/day: 0.10  . Years: 55.00  . Pack years: 5.50  . Types: Cigarettes  . Last attempt to quit: 09/18/2017  . Years since quitting: 0.3  Smokeless Tobacco Never Used  Tobacco Comment   <1 ppd    Social History   Substance and Sexual Activity  Alcohol Use No     No Known Allergies  Current Outpatient Medications  Medication Sig Dispense Refill  . alfuzosin (UROXATRAL) 10 MG 24 hr tablet Take 10 mg by mouth daily with breakfast.    . atorvastatin (LIPITOR) 40 MG tablet Take 1 tablet (40 mg total) by mouth daily at 6 PM. 90 tablet 3  . clonazePAM (KLONOPIN) 1 MG tablet Take 2 tablets (2 mg total) by mouth at bedtime. 60 tablet 2  . diclofenac sodium (VOLTAREN) 1 % GEL Apply 4 g topically 4 (four) times daily as needed (for leg soreness). (Patient taking differently: Apply 4 g topically 4 (four) times daily as needed (for leg soreness-mostly uses at bedtime). ) 1 Tube 2  . finasteride (PROSCAR) 5 MG tablet Take 1 tablet (5 mg total) by mouth daily. 30 tablet 12  . lidocaine-prilocaine (EMLA) cream  Apply 1 application topically 2 (two) times daily as needed. Applies to ankle area when skin splits from edema.  4  . Multiple Vitamin (MULTIVITAMIN WITH MINERALS)  TABS tablet Take 1 tablet by mouth daily.    . nitroGLYCERIN (NITROSTAT) 0.4 MG SL tablet Place 1 tablet (0.4 mg total) under the tongue every 5 (five) minutes as needed for chest pain (Max 3 doses within 15 minutes. Call 911 at 3rd dose). (Patient taking differently: Place 0.4 mg under the tongue every 5 (five) minutes as needed for chest pain (Max 3 doses within 15 minutes. Call 911 at 3rd dose). Uses very infrequently.)  12  . oxyCODONE-acetaminophen (PERCOCET) 10-325 MG tablet Take 1 tablet by mouth every 4 (four) hours as needed for pain.     . potassium chloride SA (K-DUR,KLOR-CON) 20 MEQ tablet Take 2 tablets (40 mEq total) by mouth 2 (two) times daily. 360 tablet 3  . rivaroxaban (XARELTO) 20 MG TABS tablet Take 1 tablet (20 mg total) by mouth daily with supper. 30 tablet 3  . rOPINIRole (REQUIP) 1 MG tablet TAKE 2 TABLETS BY MOUTH AT BEDTIME (Patient taking differently: TAKE 2 MG BY MOUTH AT BEDTIME) 60 tablet 0  . tiZANidine (ZANAFLEX) 4 MG tablet Take 4 mg by mouth 4 (four) times daily as needed for muscle spasms.   12  . torsemide (DEMADEX) 20 MG tablet Take 3 tablets (60 mg total) by mouth 2 (two) times daily. 450 tablet 3  . traZODone (DESYREL) 50 MG tablet Take 50 mg by mouth at bedtime.  5  . triamcinolone ointment (KENALOG) 0.1 % APPLY TO AFFECTED AREA TWICE A DAY 30 g 0   No current facility-administered medications for this visit.     Pertinent items are noted in HPI.   Review of Systems:     Cardiac Review of Systems: [Y] = yes  or   [ N ] = no   Chest Pain [ n   ]  Resting SOB Blue.Reese   ] Exertional SOB  [  y]  Orthopnea [ y ]   Pedal Edema [ y  ]    Palpitations Blue.Reese  ] Syncope  [ n ]   Presyncope [n   ]   General Review of Systems: [Y] = yes [  ]=no Constitional: recent weight change [ y ];  Wt loss over the last 3  months [   ] anorexia [  ]; fatigue [  ]; nausea [  ]; night sweats [  ]; fever [  ]; or chills [  ];           Eye : blurred vision [  ]; diplopia [   ]; vision changes [  ];  Amaurosis fugax[  ]; Resp: cough Blue.Reese  ];  wheezing[  ];  hemoptysis[  ]; shortness of breath[y  ]; paroxysmal nocturnal dyspnea[y  ]; dyspnea on exertion[ y ]; or orthopnea[ y ];  GI:  gallstones[  ], vomiting[  ];  dysphagia[  ]; melena[  ];  hematochezia [  ]; heartburn[  ];   Hx of  Colonoscopy[  ]; GU: kidney stones [  ]; hematuria[  ];   dysuria [  ];  nocturia[  ];  history of     obstruction [  ]; urinary frequency [  ]             Skin: rash, swelling[  ];, hair loss[  ];  peripheral edema[  ];  or itching[  ]; Musculosketetal: myalgias[  ];  joint swelling[  ];  joint erythema[  ];  joint pain[  ];  back pain[  ];  Heme/Lymph:  bruising[  ];  bleeding[  ];  anemia[  ];  Neuro: TIA[  ];  headaches[  ];  stroke[  ];  vertigo[  ];  seizures[  ];   paresthesias[  ];  difficulty walking[y  ];  Psych:depression[  ]; anxiety[  ];  Endocrine: diabetes[y  ];  thyroid dysfunction[  ];  Immunizations: Flu up to date [  ]; Pneumococcal up to date [  ];  Other: Long-term smoking history stopped in January of this year     PHYSICAL EXAMINATION: BP 111/69   Pulse 79   Resp 20   Ht 5\' 11"  (1.803 m)   Wt (!) 312 lb (141.5 kg)   SpO2 90% Comment: 3L O2 per [place]  BMI 43.52 kg/m  General appearance: alert, cooperative, appears older than stated age, fatigued and morbidly obese Head: Normocephalic, without obvious abnormality, atraumatic Neck: no adenopathy, no carotid bruit, no JVD, supple, symmetrical, trachea midline and thyroid not enlarged, symmetric, no tenderness/mass/nodules Lymph nodes: Cervical, supraclavicular, and axillary nodes normal. Resp: diminished breath sounds bilaterally Cardio: irregularly irregular rhythm, no click and no rub GI: Obesity making on exam of the abdomen and possible Extremities: edema  Massive lower extremity edema bilaterally, varicose veins noted and venous stasis dermatitis noted Neurologic: Grossly normal  Diagnostic Studies & Laboratory data:     Recent Radiology Findings:  X-ray Chest Pa And Lateral  Result Date: 01/18/2018 CLINICAL DATA:  CHF EXAM: CHEST - 2 VIEW COMPARISON:  12/26/2017 FINDINGS: Cardiomegaly with vascular congestion. Diffuse interstitial prominence again noted compatible with edema. Rounded density in the left upper lobe again noted, stable. Small effusions. No acute bony abnormality. Right Port-A-Cath is unchanged. IMPRESSION: Cardiomegaly with vascular congestion and mild to moderate pulmonary edema. Small bilateral effusions. Left upper lobe nodule again noted, stable. No change since prior study. Electronically Signed   By: Rolm Baptise M.D.   On: 01/18/2018 15:02   Nm Pet Image Initial (pi) Skull Base To Thigh  Result Date: 01/10/2018 CLINICAL DATA:  Initial treatment strategy for left lung mass. EXAM: NUCLEAR MEDICINE PET SKULL BASE TO THIGH TECHNIQUE: 10.44 mCi F-18 FDG was injected intravenously. Full-ring PET imaging was performed from the skull base to thigh after the radiotracer. CT data was obtained and used for attenuation correction and anatomic localization. Fasting blood glucose: 90 mg/dl COMPARISON:  CT chest dated 12/27/2017 FINDINGS: Mediastinal blood pool activity: SUV max 2.1 NECK: No hypermetabolic cervical lymphadenopathy. Incidental CT findings: none CHEST: 3.0 x 2.3 cm mass in the medial left lung apex, max SUV 8.7, compatible with primary bronchogenic neoplasm. No hypermetabolic thoracic lymphadenopathy. Incidental CT findings: Cardiomegaly with mild interstitial edema. Small right and trace left pleural effusions. Atherosclerotic calcifications of the aortic arch. Three vessel coronary atherosclerosis. Right chest port terminating at the cavoatrial junction. ABDOMEN/PELVIS: No abnormal hypermetabolism in the liver, spleen, pancreas,  or adrenal glands. No hypermetabolic abdominopelvic lymphadenopathy. Incidental CT findings: Mildly nodular hepatic contour. Cholelithiasis. Mild mesenteric fluid/edema with trace perihepatic ascites. Atherosclerotic calcifications the abdominal aorta and branch vessels. Small fat containing periumbilical hernia. Body wall edema. SKELETON: No focal hypermetabolic activity to suggest skeletal metastasis. Incidental CT findings: Degenerative changes the visualized thoracolumbar spine. IMPRESSION: 3.0 cm mass in the medial left lung apex, compatible with primary bronchogenic neoplasm. No findings suspicious for metastatic disease. Electronically Signed   By: Julian Hy M.D.   On: 01/10/2018 09:14     I have independently reviewed the above radiology studies  and reviewed the findings with the  patient.   CLINICAL DATA:  Shortness of breath  EXAM: CT ANGIOGRAPHY CHEST WITH CONTRAST  TECHNIQUE: Multidetector CT imaging of the chest was performed using the standard protocol during bolus administration of intravenous contrast. Multiplanar CT image reconstructions and MIPs were obtained to evaluate the vascular anatomy.  CONTRAST:  80 mL Isovue 370  COMPARISON:  Chest radiograph 05/13/2017  FINDINGS: Cardiovascular: Contrast injection is sufficient to demonstrate satisfactory opacification of the pulmonary arteries to the segmental level. There is no pulmonary embolus. The main pulmonary artery is enlarged, measuring 3.3 cm at the bifurcation. There is no CT evidence of acute right heart strain. Mild calcific atherosclerosis of the aorta. There is a normal 3-vessel arch branching pattern. Heart size is enlarged. Status post coronary artery stenting. No pericardial effusion.  Mediastinum/Nodes: No mediastinal, hilar or axillary lymphadenopathy. The visualized thyroid and thoracic esophageal course are unremarkable.  Lungs/Pleura: Small bilateral pleural effusions. Mild  interstitial edema. No focal consolidation. There is bibasilar atelectasis. No pulmonary nodules or masses.  Upper Abdomen: Contrast bolus timing is not optimized for evaluation of the abdominal organs. Within this limitation, the visualized organs of the upper abdomen are normal.  Musculoskeletal: No chest wall abnormality. No acute or significant osseous findings.  Review of the MIP images confirms the above findings.  IMPRESSION: 1. No pulmonary embolus. 2. Cardiomegaly, small bilateral pleural effusions and interstitial edema, suggesting congestive heart failure. 3. Mildly enlarged main pulmonary artery, which may be seen in pulmonary hypertension. 4.  Aortic Atherosclerosis (ICD10-I70.0).   Electronically Signed   By: Ulyses Jarred M.D.   On: 05/17/2017 00:01   Recent Lab Findings: Lab Results  Component Value Date   WBC 4.1 01/18/2018   HGB 11.4 (L) 01/18/2018   HCT 36.2 (L) 01/18/2018   PLT 129 (L) 01/18/2018   GLUCOSE 114 (H) 01/24/2018   CHOL 127 05/17/2017   TRIG 38 05/17/2017   HDL 27 (L) 05/17/2017   LDLCALC 92 05/17/2017   ALT 17 01/18/2018   AST 18 01/18/2018   NA 136 01/24/2018   K 3.9 01/24/2018   CL 94 (L) 01/24/2018   CREATININE 1.17 01/24/2018   BUN 27 (H) 01/24/2018   CO2 33 (H) 01/24/2018   TSH 1.607 01/18/2018   INR 1.09 05/16/2017   HGBA1C 5.2 10/23/2014   ECHO:    *[facility] - [place], [place] [address]                            [phone]  ------------------------------------------------------------------- Transthoracic Echocardiography  Patient:    Jahni, Nazar MR #:       [id number] Study Date: 12/27/2017 Gender:     M Age:        77 Height:     180.3 cm Weight:     150 kg BSA:        2.82 m^2 Pt. Status: Room:       [address]    Sinda Du [address]  [address] [id number]  SONOGRAPHER  Leavy Cella  PERFORMING   [facility]  cc:  ------------------------------------------------------------------- LV EF: 40% -   45%  ------------------------------------------------------------------- Indications:      CHF - 428.0.  ------------------------------------------------------------------- History:   PMH:  Cardiogenic Shock.  Atrial fibrillation.  Coronary artery disease.  Congestive heart failure.  PMH:   Myocardial infarction.  Myocardial infarction.  Risk factors:  Current tobacco use. Dyslipidemia.  ------------------------------------------------------------------- Study Conclusions  - Left ventricle: The cavity size was normal. Wall thickness was   increased in a pattern of mild LVH. Systolic function was mildly   to moderately reduced. The estimated ejection fraction was in the   range of 40% to 45%. The study was not technically sufficient to   allow evaluation of LV diastolic dysfunction due to atrial   fibrillation. Doppler parameters are consistent with high   ventricular filling pressure. - Regional wall motion abnormality: Hypokinesis of the mid inferior   and mid inferolateral myocardium; mild hypokinesis of the apical   anterior myocardium. - Aortic valve: Mildly thickened, mildly calcified leaflets. - Mitral valve: Moderate eccentric regurgitation. Degree of   severity may be underestimated due to eccentricity of jet. - Left atrium: The atrium was severely dilated. - Right ventricle: The cavity size was mildly dilated. Systolic   function was moderately reduced. - Right atrium: The atrium was moderately dilated. - Tricuspid valve: There was mild regurgitation. - Inferior vena cava: The vessel was dilated. The respirophasic   diameter changes were blunted (< 50%), consistent with elevated   central venous pressure. Estimated CVP 15  mmHg.  ------------------------------------------------------------------- Study data:  Comparison was made to the study of 05/17/2017.  Study status:  Routine.  Procedure:  Transthoracic echocardiography. Image quality was adequate.          Transthoracic echocardiography.  M-mode, complete 2D, spectral Doppler, and color Doppler.  Birthdate:  Patient birthdate: [birth year].  Age:  Patient is 77 yr old.  Sex:  Gender: male.    BMI: 46.1 kg/m^2.  Blood pressure:     115/74  Patient status:  Inpatient.  Study date: Study date: 12/27/2017. Study time: 10:39 AM.  Location:  Bedside.   -------------------------------------------------------------------  ------------------------------------------------------------------- Left ventricle:  The cavity size was normal. Wall thickness was increased in a pattern of mild LVH. Systolic function was mildly to moderately reduced. The estimated ejection fraction was in the range of 40% to 45%.  Regional wall motion abnormalities: Hypokinesis of the mid inferior and mid inferolateral myocardium; mild hypokinesis of the apical anterior myocardium. The study was not technically sufficient to allow evaluation of LV diastolic dysfunction due to atrial fibrillation. Doppler parameters are consistent with high ventricular filling pressure.  ------------------------------------------------------------------- Aortic valve:   Mildly thickened, mildly calcified leaflets. Doppler:   There was no stenosis.   There was no regurgitation.  ------------------------------------------------------------------- Aorta:  Aortic root: The aortic root was normal in size.  ------------------------------------------------------------------- Mitral valve:   Normal thickness leaflets . Moderate eccentric regurgitation. Degree of severity may be underestimated due to eccentricity of jet.  Doppler:     Peak gradient (D): 12 mm  Hg.  ------------------------------------------------------------------- Left atrium:  The atrium was severely dilated.  ------------------------------------------------------------------- Atrial septum:  No defect or patent foramen ovale was identified.   ------------------------------------------------------------------- Right ventricle:  The cavity size was mildly dilated. Systolic function was moderately reduced.  ------------------------------------------------------------------- Pulmonic valve:   Poorly visualized.  Doppler:  There was no significant regurgitation.  ------------------------------------------------------------------- Tricuspid valve:   Structurally normal valve.    Doppler:  There  was mild regurgitation.  ------------------------------------------------------------------- Right atrium:  The atrium was moderately dilated.  ------------------------------------------------------------------- Pericardium:  There was no pericardial effusion.  ------------------------------------------------------------------- Systemic veins: Inferior vena cava: The vessel was dilated. The respirophasic diameter changes were blunted (< 50%), consistent with elevated central venous pressure. Estimated CVP 15 mmHg.  ------------------------------------------------------------------- Measurements   Left ventricle                             Value        Reference  LV ID, ED, PLAX chordal            (H)     62    mm     43 - 52  LV ID, ES, PLAX chordal            (H)     40.6  mm     23 - 38  LV fx shortening, PLAX chordal             35    %      >=29  LV PW thickness, ED                        13.8  mm     ----------  IVS/LV PW ratio, ED                        0.81         <=1.3  LV e&', lateral                             6.42  cm/s   ----------  LV E/e&', lateral                           26.48        ----------  LV e&', medial                              4.79   cm/s   ----------  LV E/e&', medial                            35.49        ----------  LV e&', average                             5.61  cm/s   ----------  LV E/e&', average                           30.33        ----------    Ventricular septum                         Value        Reference  IVS thickness, ED                          11.2  mm     ----------    LVOT  Value        Reference  LVOT ID, S                                 20    mm     ----------  LVOT area                                  3.14  cm^2   ----------    Aorta                                      Value        Reference  Aortic root ID, ED                         34    mm     ----------    Left atrium                                Value        Reference  LA ID, A-P, ES                             48    mm     ----------  LA ID/bsa, A-P                             1.7   cm/m^2 <=2.2  LA volume, S                               172   ml     ----------  LA volume/bsa, S                           61.1  ml/m^2 ----------  LA volume, ES, 1-p A4C                     134   ml     ----------  LA volume/bsa, ES, 1-p A4C                 47.6  ml/m^2 ----------  LA volume, ES, 1-p A2C                     205   ml     ----------  LA volume/bsa, ES, 1-p A2C                 72.8  ml/m^2 ----------    Mitral valve                               Value        Reference  Mitral E-wave peak velocity                170   cm/s   ----------  Mitral A-wave peak velocity  26.4  cm/s   ----------  Mitral deceleration time           (H)     313   ms     150 - 230  Mitral peak gradient, D                    12    mm Hg  ----------  Mitral E/A ratio, peak                     6.4          ----------  Mitral maximal regurg velocity,            491   cm/s   ----------  PISA  Mitral regurg VTI, PISA                    117   cm     ----------  Mitral ERO, PISA                           0.56   cm^2   ----------  Mitral regurg volume, PISA                 66    ml     ----------    Right atrium                               Value        Reference  RA ID, S-I, ES, A4C                (H)     59.8  mm     34 - 49  RA area, ES, A4C                   (H)     27.8  cm^2   8.3 - 19.5  RA volume, ES, A/L                         115   ml     ----------  RA volume/bsa, ES, A/L                     40.8  ml/m^2 ----------    Systemic veins                             Value        Reference  Estimated CVP                              3     mm Hg  ----------    Right ventricle                            Value        Reference  TAPSE                                      20.1  mm     ----------  RV s&', lateral, S  6.85  cm/s   ----------  Legend: (L)  and  (H)  mark values outside specified reference range.  ------------------------------------------------------------------- Prepared and Electronically Authenticated by  Kate Sable, MD 2019-04-30T12:00:17  Interpretation Tissue-Flow Cytometry - MONOCLONAL B-CELL POPULATION IDENTIFIED. 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 The presence of CD5 expression correlates with the diagnosis of mantle cell lymphoma seen in the lymph node biopsy ([id number]). (BNS:ecj 02/01/2014) Susanne Greenhouse MD  Assessment / Plan:   Enlarging left upper lobe lung mass, slowly increasing in size since September 2018, with positive PET scan highly suggestive of primary lung malignancy.  Without significant hypermetabolic lymph nodes unlikely that this is a recurrence of lymphoma.  The patient is extremely poor medical condition makes consideration for any operative intervention or or even general anesthesia for navigation bronchoscopy would carry significant risk.  I will refer the patient to interventional radiology for consideration of needle biopsy.  He is on anticoagulation which will need  to be held prior to any needle biopsy.  We will also refer him to radiation oncology, for consideration of stereotactic radiotherapy and possibly without a biopsy.   Recent multiple admissions for congestive heart failure shortness of breath fluid overload with as much as 25 to 30 pounds  History of mantle cell /non-Hodgkin's B-cell lymphoma  Previous history of cardiogenic shock intra-aortic balloon pump placement and stenting 2016  Chronic atrial fibrillation  I  spent 40 minutes with  the patient face to face and greater then 50% of the time was spent in counseling and coordination of care.    Grace Isaac MD      [address] [place] [address] Office [phone]   [place]  02/01/2018 1:11 PM

## 2018-02-02 ENCOUNTER — Other Ambulatory Visit: Payer: Self-pay | Admitting: *Deleted

## 2018-02-02 ENCOUNTER — Encounter: Payer: Self-pay | Admitting: *Deleted

## 2018-02-02 NOTE — Patient Outreach (Signed)
[facility]) Care Management [facility] CM Multidisciplinary Case Conference 02/02/2018  [name] [name] 1941-01-09 [id number]  [facility] CM Multidisciplinary Case Discussion re:  [name] [name], 77 y/o male referred to [facility] afterrecent hospitalization April 29-Dec 30, 2017 for shortness of breath, significant bilateral lower extremity edema, weight gain of 30-plus pounds, CHF exacerbation. Patientwas discharged home to self-care after refusing home health services. Patient has history including, but not limited to, CAD with previous MI/ stenting; ischemic cardiomyopathy; sCHF; Atrial fibrillation; OSA on CPAP; BPH; and obesity.Unfortunately, patient experienced hospital re-admission 19 days post- hospital discharge on May 22-28, 2019 for CHF exacerbation with weight gain, SOB, and lower extremity swelling.  Patient was again discharged home to self-care without home health services in place.  Barriers: -- did not have accurate scales post-first hospital discharge on Dec 30, 2017-- [facility] CCM pharmacist facilitated new scale delivery to patient's home -- knowledge deficit re: self-health management of chronic state of CHF -- questionable patient denial of chronic state of health:  Patient refusal of need home health services; ongoing report of clinical condition inconsistent with patient presentation  -- newly prescribed home O2 post-most recent hospital discharge -- questionable lack of engagement with [place] team: difficulty maintaining contact with patient; patient no-show for previously scheduled home visit -- plan to address lung mass suggestive of malignancy in progress  [facility] CM Team Discussion: Value of patient having scales delivered by [employer] provider to be introduced/ discussed with patient; [facility] nurse to reach out to patient and to patient's PCP to encourage patient to engage in [place] program; [place] home visit  scheduled.  Plan:  [place] team will continue efforts to engage patient and to collaborate with care providers to develop plan of care for self-health management of CHF  Oneta Rack, RN, BSN, [employer] Coordinator [facility] Care Management  [phone]

## 2018-02-02 NOTE — Patient Outreach (Addendum)
[facility]) Care Management [place] Telephone Outreach Post-hospital discharge day [place] Coordination   02/02/2018  [name] [name] Mar 11, 1941 [id number]  [place] outreach to [name] [name], 77 y/o male referred to [facility] afterrecent hospitalization April 29-Dec 30, 2017 for shortness of breath, significant bilateral lower extremity edema, weight gain of 30-plus pounds, CHF exacerbation. Patientwas discharged home to self-care after refusing home health services. Patient has history including, but not limited to, CAD with previous MI/ stenting; ischemic cardiomyopathy; sCHF; Atrial fibrillation; OSA on CPAP; BPH; and obesity.Unfortunately, patient experienced hospital re-admission May 22-28, 2019 for CHF exacerbation with weight gain, SOB, and lower extremity swelling.  Patient was again discharged home to self-care without home health services in place.  HIPAA/ identity verified with patient during phone call today.  Today, patient reports that he "is doing okay/ better" after his recent hospitalization, and he denies pain, new/ recent falls.  Patient sounds to be in no obvious distress throughout entirety of phone call today.  Patient further reports:  -- has all medications and is taking as prescribed; however, when questioned about his diuretic dosing, patient stated that he has continued taking Torsemide 40 mg BID, despite that this was increased during cardiology office visit to 60 mg po BID-- patient was counseled on adherence to prescribed diuretic dosing, however, he reports today that he "just can not do this," as he "pees all over himself all day long" when he takes more than 40 mg at a time.  Patient also stated that he has only been taking potassium QD, rather than BID, as prescribed.  Patient again declines medication review of other medications, stating that he is not currently near his  medications:  Discussed with patient that he should expect a call from [place] next week, and encouraged him to listen for/ and to accept this phone call, and to have his medications nearby for review; patient verbalized understanding and agreement, and stated he would do.  -- attended recent cardiology and cardiothoracic surgeon office visit this week; all other upcoming provider appointments were reviewed with patient, and patient verbalizes accurate report of upcoming scheduled appointments; states he will continue to drive himself to appointments.  Patient reports that he has not yet scheduled PCP office visit post-most recent hospital discharge:  10:35 am:  Placed care coordination outreach to PCP office in attempt to schedule post-hospital discharge office visit with patient; left voice mail message for office practice staff, "Joy" requesting call back to schedule this visit with patient, who verbalized that he needs to handle scheduling around other things he has planned.  Encouraged patient to discuss his concerns with the side effects of his current medication (diuretic) dosing with PCP-- patient agreed to do so; encouraged him to take all medications with him to PCP office visit once scheduled.  Self-health management ofchronic disease state of CHF: --again provided positive reinforcement for patientstarting to monitor/recorddaily weights prior to his most recent hospitalization, and encouraged him to continue doing so; patient stated he planned to continue this practice.   -- reports weight range since last hospital discharge between 296-298 lbs with weight this morning of 297 lbs; able to verbalize weight gain guidelines in setting of CHF to report any weight gain > 3 lbs overnight/ 5 lbs in one week; again encouraged patient to take all recorded weights to provider office visits with him for review -- discussed U[employer] benefit for heart failure program with scales  and  encouraged patient to listen out for a call form insurance nurse-- patient stated that he doesn't understand why this is necessary, as he is now monitoring and recording daily weights with an accurate scale-- I encouraged the patient to discuss the benefit with the insurance nurse and encouraged his participation, explaining that this program would assist his own monitoring; he stated he would consider -- discussed/ reiterated previously provided education around signs/ symptoms yellow zone for CHF-- encouraged patient to continue regularly reviewing printed educational material that had been previously provided to him -- discussed with patient newly prescribed home O2; patient reports "going fine at home;" but states he doesn't "like" the portable O2, stating it is difficult to manage-- encouraged patient to discuss needs with PCP once he attends office visit-- counseled patient to use O2 as prescribed, patient verbalizes understanding and agreement, stating he "just wants a system that is easier to manage" when he goes out/ leaves his home --continues using CPAP at night "every night," now "along with the O2."  Patient denies further issues, concerns, or problems today. Iconfirmed thatpatienthasmy direct phone number, the main [facility] CM office phone number, and the [facility] CM 24-hour nurse advicelinephone number should issues arise prior to next scheduled [place] outreach. Encouraged himto contact me directly if needs, questions, issues, or concerns arise prior to next scheduled outreach with initial home visit in 2 weeks; patient agreed to do so.[place] initial home visit which was previously scheduled.   Plan:  Patient will take medications as prescribed and will attend all scheduled provider appointments post-hospital discharge  Patient will promptly schedule PCP office visit for post-hospital discharge evaluation and medication review  Patient will promptly  notify care providers for any new concerns/ issues/ problems that arise post-hospital discharge  Patient will continue monitoring and recording daily weights, and will report weight gain of > 3 lbs overnight/ 5 lbs in one week, to care providers  Patient will communicate with [facility] Pharmacist regarding medication management/ need for medication review, assistance  I will share today's phone call notes with patient's PCP, cardiology provider, and [facility] pharmacist as [place] outreach to continue withscheduled initial home visit in 2weeks  [facility] CM Care Plan Problem One     Most Recent Value  Care Plan Problem One  Risk for hospital readmission, as evidenced by 2 recent hospitalizations April 29-Dec 30, 2017 and May 22-28, 2019 for CHF exacerbation  Role Documenting the Problem One  Care Management Coordinator  Care Plan for Problem One  Active  [facility] Long Term Goal   Over the next 31 days, patient will not experience hospital readmission, as evidenced by patient reporting and review of EMR during [place] RN CM outreach  [facility] Long Term Goal Start Date  01/25/18  Interventions for Problem One Long Term Goal  Discussed current clinical status with patient,  current non-adherence to medications,  counseled patient on adherence to medications as prescribed, and encouraged him to discuss his concerns with side effects of diuretics with care providers promptly  [facility] CM Short Term Goal #1   Over the next 7 days, patient will schedule a hospital discharge follow up appointment with his PCP, as evidenced by patient reporting and collaboration with PCP team as indicated, during [place] RN CM outreach  [facility] CM Short Term Goal #1 Start Date  01/25/18  [facility] CM Short Term Goal #1 Met Date  02/02/18 - Goal not met  Interventions for Short  Term Goal #1  Discussed with patient his lack of scheduling post-hospital discharge PCP office visit and attempted to facilitate this appointment being  scheduled- placed care coordination call to PCP's office    [facility] CM Care Plan Problem Two     Most Recent Value  Care Plan Problem Two  Knoweldge deficit around self-health management of chronic disease state of CHF, as evidenced by patient reporting of same  Role Documenting the Problem Two  Care Management Coordinator  Care Plan for Problem Two  Active  Interventions for Problem Two Long Term Goal   Reiterated with patient signs/ symptoms concerning for yellow zone of CHF, with corresponding action plan,  discussed use of newly prescribed O2 at home with patient  [facility] Long Term Goal  Over the next 60 days, patient will verbalize signs and symptoms of yellow zone along with corresponding action plan, as evidenced by patient reporting during [place] outreach  [place] Term Goal Start Date  01/02/18  [facility] CM Short Term Goal #1   Over the next 30 days, patient will monitor and record daily weights, as evidenced by review of same and patient reporting during [place] outreach  [facility] CM Short Term Goal #1 Start Date  02/02/18 [Goal extended today]  Interventions for Short Term Goal #2   Discussed with patient his progress in monitoring and recording daily weights,  positive reinforcement provided to patient for monitoring and recording daily weights,  discussed UHC insurance HF program with patient, and encouraged him to discuss with [facility] nurse and to participate in this program     Oneta Rack, RN, BSN, [place] Care Management  [phone]

## 2018-02-06 ENCOUNTER — Inpatient Hospital Stay (HOSPITAL_COMMUNITY): Admission: RE | Admit: 2018-02-06 | Payer: Medicare Other | Source: Ambulatory Visit

## 2018-02-07 NOTE — Progress Notes (Signed)
Thoracic Location of Tumor / Histology: Left upper lobe lung nodule  Patient presented with shortness of breath.  CTA Chest 05/13/2017: No pulmonary nodules or masses.  CT chest 12/27/2017: 2.7 cm spiculated nodule in the medial left upper lobe/apex concerning for lung cancer.  This could be further evaluated with PET CT.  PET 01/10/2018: 3.0 cm mass in the medial left lung apex, compatible with primary bronchogenic neoplasm.  No findings suspicious for metastatic disease.  Chest x-ray 01/18/2018: cardiomegaly with vascular congestion and mild to moderate pulmonary edema.  Small bilateral effusions.  Left upper lobe nodule again noted, stable.  No change since prior study.  Biopsies of   Tobacco/Marijuana/Snuff/ETOH use: Former smoker, quit in January 2019.  Past/Anticipated interventions by cardiothoracic surgery, if any:  Dr. Servando Snare 02/01/2018 Enlarging left upper lobe lung mass, slowly increasing in size since September 2018, with positive PET scan highly suggestive of primary lung malignancy.  Without significant hypermetabolic lymph nodes unlikely that this is a recurrence of lymphoma.  The patient is extremely poor medical condition makes consideration for any operative intervention or even general anesthesia for navigation bronchoscopy would carry significant risk.  I will refer the patient to interventional radiology for consideration of needle biopsy.  He is on anticoagulation which will need to be held prior to any needle biopsy.  We will also refer him to radiation oncology, for consideration of stereotactic radiotherapy and possibly without a biopsy.  Past/Anticipated interventions by medical oncology, if any:    Signs/Symptoms  Weight changes, if any: None  Respiratory complaints, if any: Wears 2 liter oxygen.  SOB, wheezing at all times.   Hemoptysis, if any: Productive cough, no blood noted.  Pain issues, if any:  Back pain    Wt Readings from Last 3 Encounters:   03/08/18 (!) 343 lb (155.6 kg)  03/06/18 (!) 338 lb (153.3 kg)  02/14/18 (!) 330 lb (149.7 kg)   SAFETY ISSUES:  Prior radiation? No  Pacemaker/ICD?  No  Possible current pregnancy? No  Is the patient on methotrexate? No   Current Complaints / other details: 5 years ago the patient was diagnosed with mantle cell lymphoma treated with chemotherapy.  Bleeding from his ear related to sinus problems per [name].

## 2018-02-08 ENCOUNTER — Other Ambulatory Visit: Payer: Self-pay

## 2018-02-08 NOTE — Patient Outreach (Addendum)
[facility]) Care Management  02/08/2018  [name] [name] 1941-01-25 [id number]  77year old malereferred to [place] Management.[place] services requested for medication managementPMHx includes, but not limited to, CAD, MI, ischemic cardiomyopathy, chronic systolic heart failure, h/o DVT, venous insufficiencyofboth lower extremities, obstructive sleep apnea, benign prostatic hyperplasiaandrestless leg syndrome.  Successful outreach attempt to [name]. HIPAA identifiers verified.  Subjective: [name] reports that he is not feeling good.  He reports that he has a "breathing test next week at [facility]" and an appointment tomorrow with his oncologist.  He states that his lung mass is cancer.  [name] states that he is taking one torsemide (20 mg tablet) daily.  He states that he started that dose "this week or maybe last week."  He reports that he will not take 60 mg twice a day as prescribed.  He states that he cut his dose back because he doesn't have control of his bladder and reports urinating on himself while at an appointment last week, despite wearing [employer]wear.  He also said that the torsemide makes his stomach upset and it makes him dizzy.  He reports that he feels like he does not want to get up and states, "Right now, I just don't give a damn."   When questioned, he reports that he has not weighed himself since last week and reports a weight of 297-298lbs at that time.  Weight charted in Epic on a 6/6 visit was 312lbs.  He states the he would not weigh himself when I requested because he has his clothes on and it would make the weight inaccurate. He reports that his feet are swollen and he has some respiratory distress evident during our conversation, though not as bad as he sounded prior to his last admission.  He reports that [name] is out of town and that he will not see another doctor.  He says he hopes [name] will have a  plan for him at his next appointment, like "wearing a bag."   At the end of the conversation, he said he would try to take at least 40 mg of torsemide twice daily, but that he "would not make me any promises."    Assessment: Heavily counseled [name] about the  importance of taking his diuretic as prescribed, as this will be the only means to avoid rehospitalization. Pointed out that he is prescribed 120 mg daily and he is only taking 20 mg daily.  Reviewed the importance of daily weights and notifying [name]' of weight weight gain.   Discussed that diuretic therapy is the mainstay of therapy to keep his lungs dry and that urinary catheterization and wearing a bag would not be an option, as this would come with it's own set of problems.  I spoke with Joy at [name]' office to give her an update of this situation.  Voiced concern that patient may be taking too much potassium 20 meq three times daily, if he continues this reduced dose of diuretic.  She verified that [name] has an appointment with [name] on Monday at 11:30.  Plan:  Outreach phone call in one week.  Joetta Manners, PharmD Clinical Pharmacist [place] [phone]

## 2018-02-09 ENCOUNTER — Telehealth: Payer: Self-pay | Admitting: *Deleted

## 2018-02-09 ENCOUNTER — Ambulatory Visit: Payer: Medicare Other

## 2018-02-09 ENCOUNTER — Ambulatory Visit
Admission: RE | Admit: 2018-02-09 | Discharge: 2018-02-09 | Disposition: A | Discharge status: Home / Self Care | Payer: Medicare Other | Source: Ambulatory Visit | Attending: Radiation Oncology | Admitting: Radiation Oncology

## 2018-02-09 NOTE — Telephone Encounter (Signed)
Called the patient to inquire if he was coming to his 10:30 am appointment with Dr. Lisbeth Renshaw.  I left him a voicemail to call back and let us know.  Will continue to follow as necessary.  Gloriajean Dell. Quincy Simmonds RN, BSN   Addendum: The patient called back at 10:48 am and let me know that he forgot about his appointment and asked for it to be rescheduled.  I let him know that I would let the scheduler know and they would call him and let him know the new date and time.  Patient verbalized understanding.  Will continue to follow as necessary.  Gloriajean Dell. Leonie Green, BSN

## 2018-02-13 ENCOUNTER — Encounter (HOSPITAL_COMMUNITY): Payer: Medicare Other

## 2018-02-13 DIAGNOSIS — J449 Chronic obstructive pulmonary disease, unspecified: Secondary | ICD-10-CM | POA: Diagnosis not present

## 2018-02-13 DIAGNOSIS — I509 Heart failure, unspecified: Secondary | ICD-10-CM | POA: Diagnosis not present

## 2018-02-13 DIAGNOSIS — R918 Other nonspecific abnormal finding of lung field: Secondary | ICD-10-CM | POA: Diagnosis not present

## 2018-02-13 DIAGNOSIS — M545 Low back pain: Secondary | ICD-10-CM | POA: Diagnosis not present

## 2018-02-14 ENCOUNTER — Other Ambulatory Visit: Payer: Self-pay | Admitting: *Deleted

## 2018-02-14 ENCOUNTER — Encounter: Payer: Self-pay | Admitting: *Deleted

## 2018-02-14 NOTE — Patient Outreach (Signed)
[name]) Care Management [place] Telephone [facility] Coordination  02/14/2018  [name] [name] 1940/11/13 [id number]  Successful care coordination outreach to [name], Jacqlyn Larsen"), PCP [phone]): re-- [name] [name], 77 y/o male referred to [place] afterrecent hospitalization April 29-Dec 30, 2017 for shortness of breath, significant bilateral lower extremity edema, weight gain of 30-plus pounds, CHF exacerbation. Patientwas discharged home to self-care after refusing home health services. Patient has history including, but not limited to, CAD with previous MI/ stenting; ischemic cardiomyopathy; sCHF; Atrial fibrillation; OSA on CPAP; BPH; and obesity.Unfortunately, patient experienced hospital re-admission 19 days post- hospital discharge on May 22-28, 2019 for CHF exacerbation with weight gain, SOB, and lower extremity swelling. Patient was again discharged home to self-care without home health services in place.  Call was placed today prior to scheduled home visit with patient to clarify current orders in place for patient's diuretic/ potassium dosing, as patient's PCP notes are not visible in EMR;  Becky confirmed:  -- patient attended PCP office visit yesterday; was transported to office visit by daughter -- no weight was obtained during office visit, as patient was too short of breath to stand on scales; patient did not use assistive devices for ambulation at time of office visit yesterday -- no labs were drawn during office visit -- no changes to current medications:  Patient is to continue torsemide 20 mg po BID and potassium 20 mEq BID -- one medication added: myrbetric; Becky states patient was given samples to try at home for urinary incontinence; reports she is currently unaware of the dose of the samples provided; confirms that patient is to take samples QD and is to contact PCP office for additional dosing once he completes  samples provided -- next office visit with PCP scheduled for February 28, 2018  Plan:  [facility] RN CM initial home visit scheduled with patient at his home this afternoon  Will collaborate with patient's PCP team as indicated  Oneta Rack, RN, BSN, [employer] Coordinator [facility] Care Management  [phone]

## 2018-02-14 NOTE — Patient Outreach (Signed)
[facility]) Care Management  [place] Initial Home Visit Post-hospital discharge day 21  02/14/2018  [name] [name] Sep 01, 1940 [id number]  [name] [name] is a 77 y.o. male referred to [place] afterrecent hospitalization April 29-Dec 30, 2017 for shortness of breath, significant bilateral lower extremity edema, weight gain of 30-plus pounds, CHF exacerbation. Patientwas discharged home to self-care after refusing home health services. Patient has history including, but not limited to, CAD with previous MI/ stenting; ischemic cardiomyopathy; sCHF; Atrial fibrillation; OSA on CPAP; BPH; and obesity.Unfortunately, patient experienced hospital re-admission19 days post- hospital dischargeonMay 22-28, 2019 for CHF exacerbation with weight gain, SOB, and lower extremity swelling. Patient was again discharged home to self-care without home health services in place.  Patient and his daughter are present for [place] RN CM initial home visit today; [place] services reviewed with patient and daughter; [place] CM written consent obtained.  Patient's daughter, [name] [name], who lives with him, is present during [facility] CCM home visit, and actively participates in all aspects of today's home visit.  [place] [facility] home visit.  Patient denies pain today, stating that he took his "normal daily pain medicine" this morning, which "keeps it under control."  Reports generalized pain/ myalgias due to "bad arthritis."  Patient is short of breath at rest as baseline and is using home O2 continuously post- most recent hospital discharge.  Patient is not in obvious/ apparent distress throughout entirety of home visit today.  Subjective:  "I just want to get back to a life where I can go out of the house some without worrying about peeing all over myself."  Assessment:  [name] [name] appears to be recuperating fair after his most recent hospital discharge; he is obviously in very fragile  condition after his two recent hospitalizations for CHF exacerbation.  [name] [name] continues to deny [employer] needs, and has a very supportive daughter whom he lives with and is his primary caregiver.  [name] [name] has previously refused home health services, but agrees today to consider these services and to discuss with his PCP.  [name] [name] is experiencing a recent rash which appears to be allergic in nature.  [name] [name] is frustrated with his current state of health, and is unwilling/ unable to adhere to his prescribed diuretic therapy due to ongoing concerns with urinary incontinence.  [name] [name] admits that he is not in good health, and agrees today to begin consistent monitoring and recording of daily weights.  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  Medications: -- Has all medicationsand takes most as prescribed;denies questions about current medications.  -- Verbalizes good general understanding of the purpose, dosing, and scheduling of medications-- reports that he is unable/ unwilling to take prescribed high doses of diuretics, as he has found that he is unable to hold his urine without experiencing incontinence.  Currently taking torsemide 20 mg po BID; this was confirmed with patient's PCP office staff this morning, after PCP office visit yesterday.  Additionally, PCP prescribed Myrbetriq ER 25 mg QD in an effort to decrease his urinary incontinence in setting of recommended high-dose diuretic therapy   -- self-manage medications, takes directly from prescription bottles, does not use weekly pill planner box. -- denies issues with swallowing medications -- medication list was updated to reflect current medications not on list:  myrbetriq 25 mg po QD and Primidone 50 mg tid prn -- patient was recently discharged from the hospital and all medications were thoroughly reviewed with patient  and caregiver today  Home health [facility]) services: -- patient has consistently declined/ refused [place]  services post-hospital discharges, stating that he "is a very private person," and that he did not feel he "needed" these services post-hospital discharges -- value of [place] services were discussed with patient and caregiver today-- patient agreed to consider and to discuss with PCP at time of next scheduled office visit in ~ 10 days -- patient concerned that [facility] services will not be covered by his insurance company-- encouraged patient/ caregiver to contact insurance company for specific coverage benefits for [facility] services-- phone number provided to patient/ caregiver -- reports that PCP has ordered him a hospital bed, but that he has not yet acted on this order, again stating that he is afraid his insurance company will not cover this; caregiver/ patient agree to promptly contact insurance company around benefit coverage/ out of pocket costs; both decline assistance with same during home visit -- newly prescribed home O2 through [place]; caregiver/ patient confirm they have phone number for home O2 agency  Provider appointments: -- All recent and upcoming provider appointments were reviewed with patient today-- patient's daughter has been transporting patient to/ attending with, all scheduled provider appointments Last PCP office visit: yesterday 02/13/18 -- scheduled oncology radiation provider office visit scheduled tomorrow with PFT's; daughter will transport and attend with patient  Safety/ Mobility/ Falls: -- denies new/ recent falls, however, patient appears to be high fall risk, as he is very short of breath at rest, which worsens with short periods of ambulation around his home and his LE edema is significant and appears to impede his ability to ambulate purposefully -- assistive devices: uses cane regularly: ambulation around home is guarded, slow, but steady with use of cane -- no obvious fall risks/ hazards noted in patient's home, but he does have both a cat and a dog in home, and is  also recently using home O2 with extension tubing throughout walking spaces -- general fall risks/ prevention education discussed with patient and caregiver today, and patient was encouraged to move slowly and use assistive devices with ambulation  Self-health management of chronic disease state of CHF: -- patient has not been consistently monitoring or recording daily weight, despite extensive education previously provided during [place] outreach efforts; patient stated that he nor his daughter could figure out how to accurately set scales to "LBS" from "[place]" settings-- this was completed successfully today and patient's weight at home was noted to be 330 lbs, which is significantly increased from last recorded weight 01/30/18 of 313 lbs (cardiology office visit on 01/30/18); patient and caregiver agree to begin daily weight monitoring and recording; rationale/ value of daily weight monitoring thoroughly discussed with weight gain guidelines in setting of CHF, using teach back method -- caregiver prepares all meals for patient-- follows heart healthy/ low salt diet "as much as possible" -- education around CHF zones thoroughly reviewed/ provided with patient and caregiver today:  Patient acknowledges today that he believes he is "always" in yellow CHF zone -- Insurance benefit around [facility] Advanced illness program discussed with patient and caregiver today:  Provided phone number for [facility] RN CM, and encouraged patient/ caregiver to promptly contact nurse, noting that insurance nurse has had difficulty successfully contacting patient with prior outreach attempts -- safe use of home O2 thoroughly discussed with patient and caregiver:  Discussed need to monitor O2 dosing closely, without keeping at > 3 L/min unless otherwise directed by medical provider; discussed with  patient and caregiver option of obtaining home pulse oximetry to monitor shortness of breath outside of patient's baseline.  Patient and caregiver  verbalize understanding/ agreement on education provided today around safe use of home O2  Patient/ caregiver deny further issues, concerns, or problems today.  I re-provided/ confirmed that patient/ caregiver both have my direct phone number, the main [place] office phone number, and the [facility] CM 24-hour nurse advice phone number should issues arise prior to next scheduled [place] outreach with scheduled call to patient next week.  Encouraged patient/ caregiver to contact me directly if needs, questions, issues, or concerns arise prior to next scheduled outreach; both agreed to do so.  Objective:    BP 122/78   Pulse 94   Resp 20   Wt (!) 330 lb (149.7 kg) Comment: home scales  SpO2 90%   BMI 46.03 kg/m   Review of Systems  Constitutional: Positive for malaise/fatigue.       Obese  Respiratory: Positive for cough, sputum production and shortness of breath. Negative for wheezing.        Occasional cough noted productive of white sputum; patient is short of breath at rest as baseline, which worsens with activity of ambulation of short distances around his home  Continuous home O2 via oxygenator at 2-3 L/min  SaO2 levels 90-91% at rest; 84-88% immediately after ambulation  Cardiovascular: Positive for leg swelling. Negative for chest pain and palpitations.       Bilateral LE significantly swollen +4 from knees to ankles; (R) > (L); (R) LE erythematous without drainage noted; daughter reports legs "swollen twice as big" as they "were after last hospital visit."  Reports PCP aware of swelling; evaluated during PCP office visit yesterday  Gastrointestinal: Negative.  Negative for abdominal pain and nausea.  Genitourinary: Positive for frequency and urgency.       Diuretic therapy  Musculoskeletal: Positive for back pain, joint pain and myalgias. Negative for falls.       Reports hx arthritis-- denies pain today after taking prescribed medications  Skin: Positive for rash.        Patient has raised rash on entire (R) UE; forearm of (L) UE; lower (L) abdomen; and (L) thigh; admits to intense itching associated with rash-- appears similar in appearance to poison ivy; patient/ daughter have dog and cat in home, who are routinely outdoors; both patient and daughter admit poison ivy present in yard.  Report that this rash was inspected by PCP during office visit yesterday, and "a shot" was recommended: report they left office visit without patient receiving shot:  States, "we all forgot about it."  Currently treating with po OTC benadryl and hydrocortisone cream-- daughter reports will contact PCP to promptly obtain recommended shot  Neurological: Negative.  Negative for dizziness.  Psychiatric/Behavioral: Negative.  Negative for depression. The patient is not nervous/anxious.    Physical Exam  Constitutional: He is oriented to person, place, and time. He appears well-developed and well-nourished. No distress.    Cardiovascular: Regular rhythm, normal heart sounds and intact distal pulses. Tachycardia present.  Pulses:      Radial pulses are 2+ on the right side, and 2+ on the left side.  Unable to definitively palpate bilateral DP due to significant swelling of feet bilaterally  Respiratory: Breath sounds normal. No respiratory distress. He has no wheezes. He has no rales.  Increased WOB as baseline-- patient appears to be unable to take deep breaths for auscultation  See ROS/ SaO2  dcreases fro low 90's to mid 80's with short distance ambulation around his home; patient recovers within ~ 10 minutes rest post-activity  OSA- on CPAP q HS  Home O2 via oxygenator/ [place] at 2- 3 L/min; patient uses continuously; newly ordered home O2 since last hospital discharge 01/24/18    GI: Soft. Bowel sounds are normal.  Musculoskeletal: He exhibits edema.  See ROS  Neurological: He is alert and oriented to person, place, and time.  Skin: Skin is warm and dry. Rash noted. There is  erythema.  See ROS  Psychiatric: He has a normal mood and affect. His behavior is normal. Thought content normal.   Encounter Medications:   Outpatient Encounter Medications as of 02/14/2018  Medication Sig Note  . alfuzosin (UROXATRAL) 10 MG 24 hr tablet Take 10 mg by mouth daily with breakfast. 02/14/2018: Patient initially reported not taking-- confirms that he IS taking   . atorvastatin (LIPITOR) 40 MG tablet Take 1 tablet (40 mg total) by mouth daily at 6 PM.   . clonazePAM (KLONOPIN) 1 MG tablet Take 2 tablets (2 mg total) by mouth at bedtime.   . diclofenac sodium (VOLTAREN) 1 % GEL Apply 4 g topically 4 (four) times daily as needed (for leg soreness). (Patient taking differently: Apply 4 g topically 4 (four) times daily as needed (for leg soreness-mostly uses at bedtime). )   . finasteride (PROSCAR) 5 MG tablet Take 1 tablet (5 mg total) by mouth daily.   Marland Kitchen lidocaine-prilocaine (EMLA) cream Apply 1 application topically 2 (two) times daily as needed. Applies to ankle area when skin splits from edema.   . mirabegron ER (MYRBETRIQ) 25 MG TB24 tablet Take 25 mg by mouth daily.   . Multiple Vitamin (MULTIVITAMIN WITH MINERALS) TABS tablet Take 1 tablet by mouth daily.   . nitroGLYCERIN (NITROSTAT) 0.4 MG SL tablet Place 1 tablet (0.4 mg total) under the tongue every 5 (five) minutes as needed for chest pain (Max 3 doses within 15 minutes. Call 911 at 3rd dose). (Patient taking differently: Place 0.4 mg under the tongue every 5 (five) minutes as needed for chest pain (Max 3 doses within 15 minutes. Call 911 at 3rd dose). Uses very infrequently.) 02/14/2018: Has not needed recently per patient report today  . oxyCODONE-acetaminophen (PERCOCET) 10-325 MG tablet Take 1 tablet by mouth every 4 (four) hours as needed for pain.    . potassium chloride SA (K-DUR,KLOR-CON) 20 MEQ tablet Take 2 tablets (40 mEq total) by mouth 2 (two) times daily. (Patient taking differently: Take 20 mEq by mouth 3 (three)  times daily. 1 tablet three times daily per phone conversation on 6/12.)   . primidone (MYSOLINE) 50 MG tablet Take 50 mg by mouth 3 (three) times daily. PRN 02/14/2018: Reports takes weekly for "shaking"  . rivaroxaban (XARELTO) 20 MG TABS tablet Take 1 tablet (20 mg total) by mouth daily with supper.   Marland Kitchen rOPINIRole (REQUIP) 1 MG tablet TAKE 2 TABLETS BY MOUTH AT BEDTIME (Patient taking differently: TAKE 2 MG BY MOUTH AT BEDTIME)   . tiZANidine (ZANAFLEX) 4 MG tablet Take 4 mg by mouth 4 (four) times daily as needed for muscle spasms.    Marland Kitchen torsemide (DEMADEX) 20 MG tablet Take 3 tablets (60 mg total) by mouth 2 (two) times daily. 02/14/2018: Patient currently taking 20 mg po QD-- verified in patient's home and through PCP office today   . traZODone (DESYREL) 50 MG tablet Take 50 mg by mouth at bedtime.   Marland Kitchen  triamcinolone ointment (KENALOG) 0.1 % APPLY TO AFFECTED AREA TWICE A DAY    No facility-administered encounter medications on file as of 02/14/2018.    Functional Status:   In your present state of health, do you have any difficulty performing the following activities: 02/02/2018 01/18/2018  Hearing? N Y  Vision? N N  Difficulty concentrating or making decisions? N N  Walking or climbing stairs? Y N  Comment Reports difficult to climb stairs due to SOB with activity -  Dressing or bathing? N N  Doing errands, shopping? N Y  Conservation officer, nature and eating ? - -  Using the Toilet? - -  In the past six months, have you accidently leaked urine? - -  Do you have problems with loss of bowel control? - -  Managing your Medications? - -  Managing your Finances? - -  Housekeeping or managing your Housekeeping? - -  Some recent data might be hidden   Fall/Depression Screening:    Fall Risk  02/14/2018 02/02/2018 01/25/2018  Falls in the past year? No (No Data) (No Data)  Comment - Denies new/ recent falls post-hospital discharge denies new/ recent falls, post-hospital discharge yesterday  Risk for fall  due to : Impaired balance/gait;Medication side effect Medication side effect -  Risk for fall due to: Comment Fall evaluation completed; education on fall risks/ prevention provided - -   PHQ 2/9 Scores 02/02/2018 09/17/2014 06/18/2014  PHQ - 2 Score 0 0 0   Plan:   Patient will take medications as prescribed and will attend all scheduled provider appointments post-hospital discharge-- if patient is unable/ unwilling to take medications as prescribed, he will keep his providers informed of same  Patient will promptly notify care providers for any new concerns/ issues/ problems that arise  Patient willbegin consistentlymonitoring and recording daily weights, and will report weight gain of > 3 lbs overnight/ 5 lbs in one week, to care providers  Patient will communicate with [facility] Pharmacist regarding medication management/ need for medication review, assistance  I will share today's home visit notes with patient's PCP, cardiology provider, and [facility] pharmacist as [place]  Patient and/ or his caregiver/ daughter will contact insurance company regarding benefits for home health coverage, hospital bed coverage, and Advanced Illness Program that patient is eligible for  [place] outreach to continue withscheduled telephone call next week  [facility] CM Care Plan Problem One     Most Recent Value  Care Plan Problem One  Risk for hospital readmission, as evidenced by 2 recent hospitalizations April 29-Dec 30, 2017 and May 22-28, 2019 for CHF exacerbation  Role Documenting the Problem One  Care Management Coordinator  Care Plan for Problem One  Active  [facility] Long Term Goal   Over the next 31 days, patient will not experience hospital readmission, as evidenced by patient reporting and review of EMR during [place] RN CM outreach  [facility] Long Term Goal Start Date  01/25/18  Interventions for Problem One Long Term Goal  [facility] RN CM initial home visit completed,  care coordination outreach to  PCP completed prior to home visit-- confirmed patient's current diuretic dosing.  Physical exam with fall evaluation and medication review completed,  reviewed recent and upcoming provider appointments with patient and his daughter/ caregiver.  Reviewed patient's use of newly prescribed home O2 and provided education using teach back method around use of home O2    [facility] CM Care Plan Problem Two     Most Recent Value  Care Plan Problem Two  Knoweldge deficit around self-health management of chronic disease state of CHF, as evidenced by patient reporting of same  Role Documenting the Problem Two  Care Management Coordinator  Care Plan for Problem Two  Active  Interventions for Problem Two Long Term Goal   Extensive education provided/ reviewed/ discussed, using teach back method, with patient and his caregiver,  reviewed previously provided EMMI education that was left at patient's home during previously scheduled home visit, where patient was a no-show.  Discussed with patient [facility] insurance benefit for Advanced Illness program, and encouraged patient/ caregiver to contact [facility] RN promptly-- provided phone number for [facility] RN  [place] Term Goal  Over the next 60 days, patient will verbalize signs and symptoms of yellow zone along with corresponding action plan, as evidenced by patient reporting during [facility] RN CCM outreach  [place] Term Goal Start Date  01/02/18  [facility] CM Short Term Goal #1   Over the next 30 days, patient will monitor and record daily weights, as evidenced by review of same and patient reporting during [place] outreach  [facility] CM Short Term Goal #1 Start Date  02/02/18   Interventions for Short Term Goal #2   Discussed patient's challenges around daily weight monitoring and adjusted patient's scales at his home,  provided extensive education to patient caregiver using teach back method re: rationale for/ value of daily weight monitoring and recording in setting of CHF,  reviewed weight gain  guidelines with patient and daughter/ caregiver     Oneta Rack, RN, BSN, [place] Coordinator [facility] Care Management  [phone]

## 2018-02-15 ENCOUNTER — Ambulatory Visit: Payer: Medicare Other

## 2018-02-15 ENCOUNTER — Inpatient Hospital Stay (HOSPITAL_COMMUNITY): Admission: RE | Admit: 2018-02-15 | Payer: Medicare Other | Source: Ambulatory Visit

## 2018-02-15 ENCOUNTER — Ambulatory Visit
Admission: RE | Admit: 2018-02-15 | Discharge: 2018-02-15 | Disposition: A | Discharge status: Home / Self Care | Payer: Medicare Other | Source: Ambulatory Visit | Attending: Radiation Oncology | Admitting: Radiation Oncology

## 2018-02-15 NOTE — Progress Notes (Signed)
Spoke with the patient to see if he was coming for his appointment today and he stated that his daughter was supposed to call and cancel because he just does not feel up to the drive.  I informed the patient that the scheduler would be in contact with him to let him know the new date and time.  His chart was sent back to have his appointment rescheduled, MD and PA made aware.  Will continue to follow as necessary.  Gloriajean Dell. Leonie Green, BSN

## 2018-02-16 ENCOUNTER — Other Ambulatory Visit: Payer: Self-pay | Admitting: *Deleted

## 2018-02-16 ENCOUNTER — Ambulatory Visit: Payer: Self-pay

## 2018-02-16 NOTE — Patient Outreach (Signed)
[name]) Care Management [facility] CM Multidisciplinary Case Discussion 02/16/2018  [name] [name] [birth year] [id number]  [facility] CM Multidisciplinary Case Discussion re:  [name] [name] is a 77 y.o. male referred to [place] afterrecent hospitalization April 29-Dec 30, 2017 for shortness of breath, significant bilateral lower extremity edema, weight gain of 30-plus pounds, CHF exacerbation. Patientwas discharged home to self-care after refusing home health services. Patient has history including, but not limited to, CAD with previous MI/ stenting; ischemic cardiomyopathy; sCHF; Atrial fibrillation; OSA on CPAP; BPH; and obesity.Unfortunately, patient experienced hospital re-admission19 days post- hospital dischargeonMay 22-28, 2019 for CHF exacerbation with weight gain, SOB, and lower extremity swelling. Patient was again discharged home to self-care without home health services in place.  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 -- patient reticence to have (any form of) health care team visit him at his home -- patient refusal to adhere to recommended diuretic therapy: self-directing diuretic dosing, with PCP approval of same -- newly developed significant recent rash suspicious for poison ivy/ allergy  From 02/02/18 case discussion:  -- did not have  accurate scales post-first hospital discharge on Dec 30, 2017-- [facility] CCM pharmacist facilitated new scale delivery to patient's home -- knowledge deficit re: self-health management of chronic state of CHF -- questionable patient denial of chronic state of health:  Patient refusal of need home health services; ongoing report of clinical condition inconsistent with patient presentation  -- newly prescribed home O2 post-most recent hospital discharge -- questionable lack of engagement with [place] team: difficulty maintaining contact with patient; patient no-show for previously scheduled home visit -- plan to address lung mass suggestive of malignancy in progress  [facility] CM Team Discussion:  -- Goals of care discussion necessary to determine options of palliative care vs. Hospice; [facility] RN CM to contact patient's PCP to facilitate, as patient trusts and has a very good established relationship with PCP -- [place] RM CM/ Pharmacy outreach to continue -- case discussion update planned in 2 weeks, post next scheduled PCP office visit 02/28/18  From 02/02/18 discussion:  Value of patient having scales delivered by [employer] provider to be introduced/ discussed with patient; [facility] nurse to reach out to patient and to patient's PCP to encourage patient to engage in [place] program; [place] home visit scheduled.  Plan:  [place] team will continue efforts to engage patient and to collaborate with care providers to develop plan of care for self-health management of CHF  Oneta Rack, RN, BSN, [employer] Coordinator [facility] Care Management  [phone]

## 2018-02-17 ENCOUNTER — Ambulatory Visit: Payer: Self-pay

## 2018-02-17 ENCOUNTER — Other Ambulatory Visit: Payer: Self-pay

## 2018-02-17 NOTE — Patient Outreach (Signed)
[facility] [place] [facility]) Care Management  02/17/2018  [name] [name] [birth year] [id number]  77year old malereferred to [place] Management.[place] services requested for medication managementPMHx includes, but not limited to, CAD, MI, ischemic cardiomyopathy, chronic systolic heart failure, h/o DVT, venous insufficiencyofboth lower extremities, obstructive sleep apnea, benign prostatic hyperplasiaandrestless leg syndrome.  Unsuccessful outreach attempt to Mr. Roundtree.  Left HIPAA compliant voice message requesting a return call.  Plan: Outreach to patient next week.  Joetta Manners, PharmD Clinical Pharmacist [place] [phone]

## 2018-02-20 ENCOUNTER — Ambulatory Visit (HOSPITAL_COMMUNITY)
Admission: RE | Admit: 2018-02-20 | Discharge: 2018-02-20 | Disposition: A | Discharge status: Home / Self Care | Payer: Medicare Other | Source: Ambulatory Visit | Attending: Pulmonary Disease | Admitting: Pulmonary Disease

## 2018-02-20 ENCOUNTER — Other Ambulatory Visit: Payer: Self-pay | Admitting: *Deleted

## 2018-02-20 ENCOUNTER — Encounter: Payer: Self-pay | Admitting: *Deleted

## 2018-02-20 DIAGNOSIS — R0602 Shortness of breath: Secondary | ICD-10-CM

## 2018-02-20 NOTE — Patient Outreach (Signed)
[facility]) Care Management [place] Telephone Outreach Post-hospital discharge day [place] x 2  02/20/2018  [name] [name] Nov 28, 1940 [id number]  Successful telephone outreach to [name] [name], 77 y.o. male referred to [place] afterrecent hospitalization April 29-Dec 30, 2017 for shortness of breath, significant bilateral lower extremity edema, weight gain of 30-plus pounds, CHF exacerbation. Patientwas discharged home to self-care after refusing home health services. Patient has history including, but not limited to, CAD with previous MI/ stenting; ischemic cardiomyopathy; sCHF; Atrial fibrillation; OSA on CPAP; BPH; and obesity.Unfortunately, patient experienced hospital re-admission19 days post- hospital dischargeonMay 22-28, 2019 for CHF exacerbation with weight gain, SOB, and lower extremity swelling. Patient was again discharged home to self-care without home health services in place.  HIPAA/ identity verified with patient during phone call today.  Today, patient reports that he is "doing about the same."  Patient denies distress, however, he does sound short of breath during our phone call, as he did per his stated baseline at time of [facility] RN CM home visit last week on Tuesday 02/14/18.  Patient reports the rash he had during last week, suspicious for poison ivy, "is about the same;" and he reports that he nor his daughter contacted PCP to follow up on rash, as recommended during [place] home visit last week.  Patient further reports:  -- continued ongoing significant weight gain; reports he has started monitoring and recording daily weights after [facility] RN CM home visit last week; reports weight today of "338 lbs" which is 7 pounds greater than weight obtained at [place] home visit last week.  Patient also confirms that he has increased swelling in his feet from last week, as well as "more belly swelling."  As noted  above, patient reports ongoing shortness of breath which he describes as 'the same as always," and confirms that he has continued using home oxygen at 2-3 L/min via [place].  As reported during time of [facility] RN CCM home visit last week, patient states he is "in yellow zone" for CHF, which is his baseline.  -- continues using diuretic (torsemide 20 mg po) according to his own (self) direction around "appointments;" reports today that he is taking 'at least once a day, tries to take twice a day," but states that if he has any provider appointments scheduled he "takes less until" he "gets back home near the bathroom."  Reports plan today to attend PFT's which was previously scheduled 02/15/18, but cancelled by patient, as he did not feel up to getting in car to go.  States daughter will take him to this appointment and that he plans to "take an extra dose of the fluid pill" when he gets home form appointment today-- patient can not tell me how many diuretic pills/ dosing he plans to take today, but states that "it depends what time" he arrives home after appointment and "how tired" he is.  I had another pointed conversation with patient today regarding his currently reported clinical status/ weight gain/ increased swelling/ ongoing shortness of breath, and again explained the importance of adherence to recommended care plan for diuretic use, which is most likely contributing to his ongoing worsening of symptoms; discussed with patient that while he reports being in "yellow zone," the signs/ symptoms he reports indicates he may actually be approaching/ or IN- CHF red zone.  Confirmed with patient that he understands corresponding action plan for both yellow and red zone; encouraged patient  to promptly report his signs/ symptoms to PCP or cardiologist, but he stated that he "didn't see any reason to bother [name]," since he "has an appointment with him next week."  Discussed with patient that I was unsure that patient  would not need evaluation sooner than next week, and that would place a call to [name] to update on patient's report today.  Patient stated that if he "gets much worse" he "will go to the hospital."  Patient initially did not wish for me to contact [name], however, I explained that this is a part of my role, to partner/ keep providers updated, and he verbalized understanding and agreement.  -- patient nor his daughter has yet contacted the [place] regarding the Advanced Illness Program available to him through his insurance provider, which was discussed with him last week at time of [facility] RN CCM home visit; I encouraged patient to make this call promptly, and also to return any voice mail messages he gets from his care team, including [facility] pharmacists and [facility] insurance RN CM, as patient stated he "does not answer phone numbers" he does not recognize.  I reminded/ informed patient that his care team will leave him voice mail messages with their call back numbers, and that it was his responsibility to return voice mail messages if he would like to benefit from the programs offered; patient acknowledged and stated he would return voice mail messages if/ when he gets them.  Patient denies further issues, concerns, or problems today.  I confirmed that patient has my direct phone number, the main [facility] CM office phone number, and the [facility] CM 24-hour nurse advice phone number should issues arise prior to next scheduled [place] outreach by phone later this week.  Encouraged patient to contact me directly if needs, questions, issues, or concerns arise prior to next scheduled outreach; patient agreed to do so.  10:40 am:  Care Coordination telephone outreach to "Joy" RN at [name] office [phone]):  Discussed with Joy patient's reported clinical status today, as well as [facility] CCM RN home visit last week; informed of reported weight gain/ baseline status in yellow CHF zone; missed oncology appointment  02/15/18; patient's self-directed use of prescribed medications; patient's refusal of home health services.  Discussed with Joy possibility of PCP approaching patient for goals of care discussion, as patient clearly values PCP input and has strong established relationship with PCP.  Discused possibility of palliative care referral, and requested that [name] discuss with patient if he agrees; Caryl Asp appreciative of update-- provided my direct phone number for further care collaboration as indicated  11:00 am:  Care Coordination telephone outreach to Rennie Natter, [facility] RN [phone]) to update on patient progress/ lack thereof/ while participating in [place] program; shared with Tawanna Sat that I had encouraged patient to promptly contact [place] around his potential benefits with insurance coverage through Advanced Illness Program; provided general update on patient status.    Plan:   Patient will take medications as prescribed and will attend all scheduled provider appointments post-hospital discharge-- if patient is unable/ unwilling to take medications as prescribed, he will keep his providers informed of same  Patient will promptly notify care providers for any new concerns/ issues/ problems that arise  Patient willbegin consistentlymonitoring and recording daily weights, and will report weight gain of > 3 lbs overnight/ 5 lbs in one week, to care providers  Patient will communicate with [facility] Pharmacist regarding medication management/ need for  medication review, assistance  Patient and/ or his caregiver/ daughter will contact insurance company regarding benefits for home health coverage, hospital bed coverage, and Advanced Illness Program that patient is eligible for  [facility] CM outreach to continue withscheduledtelephone call later this week  [facility] CM Care Plan Problem One     Most Recent Value  Care Plan Problem One  Risk for hospital readmission, as evidenced by 2 recent  hospitalizations April 29-Dec 30, 2017 and May 22-28, 2019 for CHF exacerbation  Role Documenting the Problem One  Care Management Coordinator  Care Plan for Problem One  Active  [facility] Long Term Goal   Over the next 31 days, patient will not experience hospital readmission, as evidenced by patient reporting and review of EMR during [place] RN CM outreach  [facility] Long Term Goal Start Date  01/25/18  Interventions for Problem One Long Term Goal  Discussed with patient current clinical status, daily weights over last week, increased swelling in legs/ feet/ abdomen,  encouraged patient to follow action plan for yellow/ red CHF zone,  placed care coordination outreach phone calls to patient's PCP and to [facility] RN CM,  discussed patient's self-directed use of prescribed medications as related to his currently reported clinical status    [facility] CM Care Plan Problem Two     Most Recent Value  Care Plan Problem Two  Knoweldge deficit around self-health management of chronic disease state of CHF, as evidenced by patient reporting of same  Role Documenting the Problem Two  Care Management Coordinator  Care Plan for Problem Two  Active  Interventions for Problem Two Long Term Goal   Reviewed previously provided printed education around CHF zones with patient using teach back method,  discussed with patient that he may be in or approaching red CHF zones,  discussed corresponding action plans for CHF yellow/ red zones, and encouraged patient to follow yellow zone CHF action plan, although he declines doing so,  discussed with patient that I would place care coordination phone call to his PCP to inform of patient's reported clinical status, since patient will not do  [facility] Long Term Goal  Over the next 60 days, patient will verbalize signs and symptoms of yellow zone along with corresponding action plan, as evidenced by patient reporting during [facility] RN CCM outreach  [facility] Long Term Goal Start Date  01/02/18  [facility] CM Short Term  Goal #1   Over the next 30 days, patient will monitor and record daily weights, as evidenced by review of same and patient reporting during [facility] RN CCM outreach  [facility] CM Short Term Goal #1 Start Date  02/02/18   Interventions for Short Term Goal #2   Confirmed that patient has begun monitoring and recording daily weights,  discussed weights since last [facility] CCM RN home visit last week,  reiterated with patient weight gain guidleines in setting of CHF     Oneta Rack, RN, BSN, [employer] Coordinator [facility] Care Management  [phone]

## 2018-02-23 ENCOUNTER — Ambulatory Visit: Payer: Self-pay

## 2018-02-23 ENCOUNTER — Other Ambulatory Visit: Payer: Self-pay

## 2018-02-23 NOTE — Patient Outreach (Signed)
   [name]) Care Management  02/23/2018  [name] [name] 1941-08-04 [id number]  77year old malereferred to [place] Management.[place] services requested for medication managementPMHx includes, but not limited to, CAD, MI, ischemic cardiomyopathy, chronic systolic heart failure, h/o DVT, venous insufficiencyofboth lower extremities, obstructive sleep apnea, benign prostatic hyperplasiaandrestless leg syndrome.  Unsuccessful outreach attempt to [name] [name] this morning.  Left HIPAA compliant voice message requesting a return call.  Successful outreach to [name] [name] this afternoon.  HIPAA identifiers verified.  Subjective: When asked how he was doing, [name] [name] responded, "I'm still on top of the ground."  He reports that he feels like he is "breathing decent" with his 2 L of oxygen.  He states that his daughter is "persistant" and is making him take 2 torsemide in the morning and 2 in the afternoon.  He states that he only does this when he gets up early in the morning because if he takes the diuretic late in the day,  he is up all night going to the bathroom.  He states that he has been taking the torsemide like this for about a week.  He reports that he did not weight himself this morning because his daughter was in the bathroom, but states that he thinks he was "down 4 lbs" yesterday.  Patient states that his legs are not as swollen and painful.  He reports that he has a "breathing test" on Monday and will see [name] on Tuesday.   Assessment: [name] [name] sounded better than other times that I have spoken with him in the past.  He was able to carry on the entire conversation without getting winded.  Encouraged [name] [name] to continue taking torsemide 40 mg BID along with checking and recording his daily weights.  Counseled him again about the importance of taking torsemide, as it is crucial in keeping him out of the hospital.  He verbalized  understanding.    Plan: Outreach to [name] in two weeks.   Joetta Manners, PharmD Clinical Pharmacist [place] [phone]

## 2018-02-24 ENCOUNTER — Other Ambulatory Visit: Payer: Self-pay | Admitting: *Deleted

## 2018-02-24 ENCOUNTER — Encounter: Payer: Self-pay | Admitting: *Deleted

## 2018-02-24 DIAGNOSIS — I509 Heart failure, unspecified: Secondary | ICD-10-CM | POA: Diagnosis not present

## 2018-02-24 NOTE — Patient Outreach (Signed)
[facility]) Care Management [facility] CM Telephone Outreach, Transition of Care day [place] Coordination  02/24/2018  [name] [name] 1941/07/20 [id number]  Successful telephone outreach to "[name] [name] daughter/ caregiver, on [facility] CM written consent, of KIM LAUVER, 77 y.o.malereferred to [place] afterrecent hospitalization April 29-Dec 30, 2017 for shortness of breath, significant bilateral lower extremity edema, weight gain of 30-plus pounds, CHF exacerbation. Patientwas discharged home to self-care after refusing home health services. Patient has history including, but not limited to, CAD with previous MI/ stenting; ischemic cardiomyopathy; sCHF; Atrial fibrillation; OSA on CPAP; BPH; and obesity.Unfortunately, patient experienced hospital re-admission19 days post- hospital dischargeonMay 22-28, 2019 for CHF exacerbation with weight gain, SOB, and lower extremity swelling. Patient was again discharged home to self-care without home health services in place.  HIPAA/ identity verified with patient's caregiver during phone call today, and patient is also present and participates in phone call with phone on speaker mode.  Today, caregiver/ patient reports that he is "doing somewhat better."  Caregiver reports that "starting on Monday 02/20/18 of this week," she began increasing patient's self- dosing of diuretics; patient has previously refused to adhere to recommended doses of diuretic therapy stating that he can not tolerate due to constant urination and resulting fatigue.  Report that patient's previously reported poison-ivy like "rash" "is better, and is drying up with a lot less itching."  Patient sounds much less short of breath during phone call today than he has during previous phone calls, and he denies new/ recent falls, and states he "only has his regular pain."  Caregiver/ Patient further report:  -- no  significant weight gain since daughter began increasing diuretic dosing on Monday 02/20/18:  Daughter reports that patient is now taking Torsemide 40 mg po every-other-morning, and on alternating mornings takes only 20 mg; reports that patient takes 20 mg po every evening-- medication list updated to reflect this self-directed change-- positive reinforcement was provided to patient for agreeing to increase his dosing, and we again had a pointed conversation on the importance of adherence to diuretic therapy in setting of CHF.  Daughter reports that she "insisted" that patient follow her plans to increase his diuretic dosing, as she is a CNA, and after initial [place] home visit, "didn't like all the weight gain" she was witnessing as patient started completing daily weights-- daughter reports that patient's weights were "over 340 lbs;"  Since daughter began new diuretic dosing on Monday 02/20/18, she reports that patient "has lost 6-8 pounds, and is breathing better."  Reports patient weight yesterday of 334 lbs-- stated patient "just got up" and has not had time to obtain weight yet; daughter assisting and monitoring actual weight measurement with patient each day; reports unchanged bilateral LE swelling, and states that (R) LE "remains all red, without oozing."  Given that daughter is a CNA, and patient refuses home health services, encouraged daughter to discuss with PCP possibility of placing order for Una-boots for patient for her to administer at home.  Care collaboration outreach was placed to Joetta Manners, [facility] Pharmacist to update on patient's caregiver reporting of their current self-directed dosing of torsemide post- call with patient and caregiver  -- Patient continues using home O2 at 2 L/min via [place]/ oxygenator; daughter stated that she has "made sure" he does not set his home O2 "higher than 2 L/min," and reports that she noticed that when patient set his home O2 higher than 2  L, he became  "very sleepy and just wanted to sleep all the time."  Positive reinforcement provided.  -- reports patient is "between green and yellow zone today," and caregiver is able to accurately verbalize weight gain guidelines in setting of CHF and action plan for yellow- red zone; we again acknowledged that patient's baseline is probably in yellow zone.  -- patient nor his daughter has yet contacted the [place] regarding the Advanced Illness Program available to him through his insurance provider, which was discussed with them at time of [facility] RN CCM home visit; I explained that I had reached out to [facility] and obtained the direct phone number for the [facility] nurse that has been assigned to patient, and encouraged daughter/ patient to promptly make this call today to discuss benefits available to patient through this program; daughter verbalizes understanding and agreement.  Patient/ caregiver deny further issues, concerns, or problems today. I confirmed that both havemy direct phone number, the main [facility] CM office phone number, and the [facility] CM 24-hour nurse advice phone number should issues arise prior to next scheduled [place] outreach by phone later this week.  Encouraged patient/ caregiver to contact me directly if needs, questions, issues, or concerns arise prior to next scheduled outreach with routine home visit in 10 days; patient agreed to do so.  Plan:  Patient will take medications as prescribed and will attend all scheduled provider appointments post-hospital discharge-- if patient is unable/ unwilling to take medications as prescribed, he will keep his providers informed of same  Patient will promptly notify care providers for any new concerns/ issues/ problems that arise  Patient willcontinue consistentlymonitoring and recording daily weights, and will report weight gain of >3 lbs overnight/ 5 lbs in one week, to care providers  Patient will communicate with [facility] Pharmacist regarding  medication management/ need for medication review, assistance  Patient and/ or his caregiver/ daughter will contact insurance company regarding benefits for home health coverage, hospital bed coverage, and Advanced Illness Program that patient is eligible for  [place] outreach to continue withscheduledroutine home visit in 10 days  [facility] CM Care Plan Problem One     Most Recent Value  Care Plan Problem One  Risk for hospital readmission, as evidenced by 2 recent hospitalizations April 29-Dec 30, 2017 and May 22-28, 2019 for CHF exacerbation  Role Documenting the Problem One  Care Management Coordinator  Care Plan for Problem One  Not Active  [facility] Long Term Goal   Over the next 31 days, patient will not experience hospital readmission, as evidenced by patient reporting and review of EMR during [place] RN CM outreach  [facility] Long Term Goal Start Date  01/25/18  [facility] Long Term Goal Met Date  02/24/18- Goal Met  Interventions for Problem One Long Term Goal  Confirmed with patient and his caregiver/ daughter that he has not experienced hospital re-admission within the last 31 days post-most recent hospital discharge     [facility] CM Care Plan Problem Two     Most Recent Value  Care Plan Problem Two  Knoweldge deficit around self-health management of chronic disease state of CHF, as evidenced by patient reporting of same  Role Documenting the Problem Two  Care Management Coordinator  Care Plan for Problem Two  Active  Interventions for Problem Two Long Term Goal   Discussed with patient and caregiver patient's current clinical status, use of diuretic therapy, use of home O2,  reviewed and reiterated with caregiver  previously provided education around signs/ symptoms CHF zones with corresponding action plans for all zones,  scheduled next routine [facility] RN CCM home visit and confirmed that patient plans to attend upcoming PCP appointment 02/27/18 and that daughter will transport to appointment  [place] Term Goal  Over the next 65 days, patient will verbalize signs and symptoms of yellow zone along with corresponding action plan, as evidenced by patient reporting during [place] outreach [Goal extended today ]  [facility] Long Term Goal Start Date  01/02/18  [facility] CM Short Term Goal #1   Over the next 30 days, patient will monitor and record daily weights, as evidenced by review of same and patient reporting during [place] outreach  [facility] CM Short Term Goal #1 Start Date  02/02/18  Interventions for Short Term Goal #2   Confirmed that patient has consistently been monitoring and recording daily weights post- initial [place] home visit,  reviewed recently recorded weights with patient and caregiver     Oneta Rack, RN, BSN, [place] Care Management  [phone]

## 2018-02-27 ENCOUNTER — Ambulatory Visit (HOSPITAL_COMMUNITY)
Admission: RE | Admit: 2018-02-27 | Discharge: 2018-02-27 | Disposition: A | Discharge status: Home / Self Care | Payer: Medicare Other | Source: Ambulatory Visit | Attending: Pulmonary Disease | Admitting: Pulmonary Disease

## 2018-02-27 DIAGNOSIS — R0602 Shortness of breath: Secondary | ICD-10-CM | POA: Diagnosis not present

## 2018-02-27 LAB — PULMONARY FUNCTION TEST
DL/VA % pred: 62 %
DL/VA: 2.88 ml/min/mmHg/L
DLCO UNC % PRED: 21 %
DLCO UNC: 6.91 ml/min/mmHg
FEF 25-75 Pre: 0.7 L/sec
FEF2575-%PRED-PRE: 31 %
FEV1-%Pred-Pre: 35 %
FEV1-PRE: 1.08 L
FEV1FVC-%Pred-Pre: 89 %
FEV6-%Pred-Pre: 40 %
FEV6-Pre: 1.61 L
FEV6FVC-%PRED-PRE: 106 %
FVC-%Pred-Pre: 39 %
FVC-Pre: 1.68 L
PRE FEV1/FVC RATIO: 65 %
Pre FEV6/FVC Ratio: 100 %

## 2018-02-28 DIAGNOSIS — I2510 Atherosclerotic heart disease of native coronary artery without angina pectoris: Secondary | ICD-10-CM | POA: Diagnosis not present

## 2018-02-28 DIAGNOSIS — R918 Other nonspecific abnormal finding of lung field: Secondary | ICD-10-CM | POA: Diagnosis not present

## 2018-02-28 DIAGNOSIS — J449 Chronic obstructive pulmonary disease, unspecified: Secondary | ICD-10-CM | POA: Diagnosis not present

## 2018-02-28 DIAGNOSIS — I5042 Chronic combined systolic (congestive) and diastolic (congestive) heart failure: Secondary | ICD-10-CM | POA: Diagnosis not present

## 2018-03-03 ENCOUNTER — Encounter: Payer: Self-pay | Admitting: Radiation Oncology

## 2018-03-06 ENCOUNTER — Other Ambulatory Visit: Payer: Self-pay | Admitting: *Deleted

## 2018-03-06 ENCOUNTER — Encounter: Payer: Self-pay | Admitting: *Deleted

## 2018-03-06 NOTE — Patient Outreach (Signed)
[facility]) Care Management  [place] CM Routine Home Visit Post-hospital discharge day 41  03/06/2018  [name] [name] 1941/02/10 [id number]  [name] [name] is an 77 y.o. male referred to [place] afterrecent hospitalization April 29-Dec 30, 2017 for shortness of breath, significant bilateral lower extremity edema, weight gain of 30-plus pounds, CHF exacerbation. Patientwas discharged home to self-care after refusing home health services. Patient has history including, but not limited to, CAD with previous MI/ stenting; ischemic cardiomyopathy; sCHF; Atrial fibrillation; OSA on CPAP; BPH; and obesity.Unfortunately, patient experienced hospital re-admission19 days post- hospital dischargeonMay 22-28, 2019 for CHF exacerbation with weight gain, SOB, and lower extremity swelling. Patient was again discharged home to self-care without home health services in place. HIPAA/ identity verified with patient in person today, and patient's daughter/ caregiver [name] [name] is present and actively participates in all aspects of today's 75 minute home visit; pleasant home visit.  Subjective: "I acknowledge that I am doing better now that I have increased my fluid pill, but I just can't take any at night-- I stay up peeing all night long.... I will try to take an additional fluid pill in the morning, the way I was told to after my first hospital visit."  Assessment:  [name] [name] appears to continue recuperating fair after his most recent hospital discharge; he remains in obviously very fragile condition as his baseline.  [name] [name]'s very supportive daughter whom he lives with and is his primary caregiver has become more involved in his daily care needs, but she admits that patient will sometimes not follow her directions on obtaining daily weights consistently and taking his medications appropriately.  [name] [name] and his daughter have discussed possibility of having home health services initiated  for patient's ongoing bilateral lower extremity swelling with PCP, and are currently waiting to hear from home health agency for initiation of services.  Although [name] [name] remains frustrated with his current state of health, he continues to be unwilling/ unable to adhere to his prescribed diuretic therapy due to ongoing concerns with urinary incontinence.  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  Today, caregiver/ patient reports that he is "doing somewhat better," and although he appears sickly, his overall appearance is improved from last [place] CM home visit in later June.  Patient reports "usual back pain," and he denies new/ recent falls.  Patient is not in acute distress throughout entirety of today's visit, however, he is fragile/ short of breath/ with bilateral LE swelling and (R) leg erythema as baseline.  Patient's previously noted rash suspicious for poison ivy has now resolved.  Caregiver/ Patient further report:  -- Medications: ----was prescribed Amoxicillin 500 mg po TID at time of last PCP office visit 03/01/18-- patient stated he thought this was prescribed for his previously reported poison ivy rash-- however, we discussed that it was likely prescribed for ongoing (R) LE erythema, as patient reported several weeks ago that his poison ivy rash was better; patient reports taking recently prescribed antibiotic as ordered ---- continues self-dosing diuretic, as he continues to state that he is unwilling to take "any at all" in the evenings due to "staying up all night peeing."  Discussed with patient his current self-directed regimine:  Torsemide 40 mg po qam; we thoroughly discussed his previous orders to take 60 mg po BID; patient stated today that he will begin to take 60 mg po qam, but he is firm that he will NOT take any evening doses;  patient states that he had discussed his use of diuretics wit his PCP, and I encouraged both he and his caregiver to  report his self-directed dosing of this medication to his care providers and to keep all updated; patient/ caregiver verbalize understanding and agreement.  Using teachback method, provided and thoroughly reviewed with patient caregiver printed educational material on medications for CHF/ importance of diuretic therapy in setting of CHF  -- deny further changes to prescribed medications and endorses adherence to following medication instructions for all other regularly prescribed medications  -- Provider appointments: ---- attended PCP office visit 03/01/18: reports that PCP "thought patient looked better overall;" next scheduled appointment 04/03/18- daughter will continue to provide transportation to all provider appointments ---- verbalized plans to attend Wednesday 03/08/18 oncology radiation provider appointment for evalution of treatment options for lung mass  -- Self health management of chronic disease state of CHF: ---- patient has inconsistently been monitoring and recording daily weights; reports "too hard" for him to stand on scale; caregiver reports she attempts to make patient weigh daily, "but can't make him do anything he doesn't want to do."  Reiterated significance/ value of daily weight monitoring/ recording and weight gain guidelines in setting of CHF: using teach back method, provided additional printed educational material for patient's review ---- obtained weight at patient's home today:  338 lbs; review of recorded weights since last [facility] RN CCM home visit: ranges between 334-340 lbs with all recorded values ---- reviewed with patient and caregiver CHF zones and corresponding action plans: acknowledged that patient at baseline is in yellow zone, which makes daily weight monitoring especially important; patient remains short of breath with any activity/ ambulation around his home; recovers well/ promptly with rest after ambulation; SaO2 values at home today consistently between  89-90% ---- continues using home O2 at 2 L/min via [name] [name]/ oxygenator ---- patient nor his daughter has yet contacted the [place] regarding the Advanced Illness Program available to him through his insurance provider, which was discussed with them at time of [facility] RN CCM initial home visit; I again explained that I had reached out to [facility] and obtained the direct phone number for the [facility] nurse that has been assigned to patient, and encouraged daughter/ patient to promptly make this call today to discuss benefits available to patient through this program; daughter verbalizes understanding and agreement, and states she will call this afternoon.  Patient/ caregiver deny further issues, concerns, or problems today. I confirmed that both havemy direct phone number, the main [facility] CM office phone number, and the [facility] CM 24-hour nurse advice phone number should issues arise prior to next scheduled [place] outreachby phone in 9 days. Encouraged patient/ caregiver to contact me directly if needs, questions, issues, or concerns arise prior to next scheduled outreach with routine home visit in 10 days; patient agreed to do so.  Objective:  BP 118/64   Pulse 88   Resp 18   Wt (!) 338 lb (153.3 kg)   SpO2 90%   BMI 47.14 kg/m    Review of Systems  Constitutional: Positive for malaise/fatigue.       Obese ill appearing male  Respiratory: Positive for cough and shortness of breath. Negative for wheezing.        Home O2 at 2 L/min via [name] [name]/ oxygenator continuously  Cardiovascular: Positive for orthopnea and leg swelling. Negative for chest pain.       Orthopnea as baseline, "for many years" Bilateral LE swelling/ edema at +  3-4:  (R) LE remains pink in color, improved from last home visit; patient reports was recently put on antibiotics x 10 days at PCP office visit 03/01/18  Gastrointestinal: Negative for abdominal pain.       Abdomen large/ swollen appearing: question abdominal swelling vs. Baseline  obesity  Genitourinary: Positive for frequency and urgency.       Diuretic therapy  Musculoskeletal: Positive for back pain and myalgias. Negative for falls.       Reports chronic back pain "for years"  Neurological: Negative for dizziness.  Psychiatric/Behavioral: Negative for depression. The patient is not nervous/anxious.    Physical Exam  Constitutional: He is oriented to person, place, and time. He appears well-developed and well-nourished. No distress.  Cardiovascular: Normal rate and regular rhythm.  Heart sounds distant; Unable to palpate pedal pulses due to significant bilateral pedal/ LE edema  Respiratory: No respiratory distress. He has no wheezes. He has no rales.  Occasional congested sounded cough; clears with coughing; bilateral breath sounds coarse throughout A/L/P lung fields; patient continues using home O2 at 2 L/min via [name] [name] continuously Patient short of breath with ambulation around home; SaO2 levels consistently 89-90% See ROS  GI: Soft. Bowel sounds are normal.  Musculoskeletal: He exhibits edema.  See ROS  Neurological: He is alert and oriented to person, place, and time.  Skin: Skin is warm and dry. There is erythema.  Ongoing (R) LE erythema, improved from last [name] [name] home visit in June  Psychiatric: He has a normal mood and affect. His behavior is normal.   Encounter Medications:   Outpatient Encounter Medications as of 03/06/2018  Medication Sig Note  . amoxicillin (AMOXIL) 500 MG tablet Take 500 mg by mouth 3 (three) times daily. 03/06/2018: Reports this was prescribed 02/28/18 or 03/01/18 during PCP office visit-- reports ordered x 10 days  . torsemide (DEMADEX) 20 MG tablet Take 3 tablets (60 mg total) by mouth 2 (two) times daily. 03/06/2018: 03/06/18: patient reports he is now taking 40 mg q am and is NOT TAKING any evening doses; patient agrees today to begin taking 60 mg po q am-- and refuses to take any evening more doses due to ongoing urinary frequency/ urgency/  incontinence overnight; patient continues self- dosing this medication and states his PCP is aware   . alfuzosin (UROXATRAL) 10 MG 24 hr tablet Take 10 mg by mouth daily with breakfast. 02/14/2018: Patient initially reported not taking-- confirms that he IS taking   . atorvastatin (LIPITOR) 40 MG tablet Take 1 tablet (40 mg total) by mouth daily at 6 PM.   . clonazePAM (KLONOPIN) 1 MG tablet Take 2 tablets (2 mg total) by mouth at bedtime.   . diclofenac sodium (VOLTAREN) 1 % GEL Apply 4 g topically 4 (four) times daily as needed (for leg soreness). (Patient taking differently: Apply 4 g topically 4 (four) times daily as needed (for leg soreness-mostly uses at bedtime). )   . finasteride (PROSCAR) 5 MG tablet Take 1 tablet (5 mg total) by mouth daily.   Marland Kitchen lidocaine-prilocaine (EMLA) cream Apply 1 application topically 2 (two) times daily as needed. Applies to ankle area when skin splits from edema.   . mirabegron ER (MYRBETRIQ) 25 MG TB24 tablet Take 25 mg by mouth daily.   . Multiple Vitamin (MULTIVITAMIN WITH MINERALS) TABS tablet Take 1 tablet by mouth daily.   . nitroGLYCERIN (NITROSTAT) 0.4 MG SL tablet Place 1 tablet (0.4 mg total) under the tongue every 5 (five) minutes as  needed for chest pain (Max 3 doses within 15 minutes. Call 911 at 3rd dose). (Patient taking differently: Place 0.4 mg under the tongue every 5 (five) minutes as needed for chest pain (Max 3 doses within 15 minutes. Call 911 at 3rd dose). Uses very infrequently.) 02/14/2018: Has not needed recently per patient report today  . oxyCODONE-acetaminophen (PERCOCET) 10-325 MG tablet Take 1 tablet by mouth every 4 (four) hours as needed for pain.    . potassium chloride SA (K-DUR,KLOR-CON) 20 MEQ tablet Take 2 tablets (40 mEq total) by mouth 2 (two) times daily. (Patient taking differently: Take 20 mEq by mouth 3 (three) times daily. 1 tablet three times daily per phone conversation on 6/12.)   . primidone (MYSOLINE) 50 MG tablet Take 50  mg by mouth 3 (three) times daily. PRN 02/14/2018: Reports takes weekly for "shaking"  . rivaroxaban (XARELTO) 20 MG TABS tablet Take 1 tablet (20 mg total) by mouth daily with supper.   Marland Kitchen rOPINIRole (REQUIP) 1 MG tablet TAKE 2 TABLETS BY MOUTH AT BEDTIME (Patient taking differently: TAKE 2 MG BY MOUTH AT BEDTIME)   . tiZANidine (ZANAFLEX) 4 MG tablet Take 4 mg by mouth 4 (four) times daily as needed for muscle spasms.    . traZODone (DESYREL) 50 MG tablet Take 50 mg by mouth at bedtime.   . triamcinolone ointment (KENALOG) 0.1 % APPLY TO AFFECTED AREA TWICE A DAY    No facility-administered encounter medications on file as of 03/06/2018.    Fall/Depression Screening:    Fall Risk  02/24/2018 02/14/2018 02/02/2018  Falls in the past year? (No Data) No (No Data)  Comment Denies new/ recent falls - Denies new/ recent falls post-hospital discharge  Risk for fall due to : - Impaired balance/gait;Medication side effect Medication side effect  Risk for fall due to: Comment - Fall evaluation completed; education on fall risks/ prevention provided -   Plan:  Patient will take medications as prescribed and will attend all scheduled provider appointments post-hospital discharge-- if patient is unable/ unwilling to take medications as prescribed, he will keep his providers informed of same  Patient will promptly notify care providers for any new concerns/ issues/ problems that arise  Patient willre-start consistentlymonitoring and recording daily weights, and will report weight gain of >3 lbs overnight/ 5 lbs in one week, to care providers  Patient will communicate with [facility] Pharmacist regarding medication management/ need for medication review, assistance  Patient and/ or his caregiver/ daughter will contact insurance company regarding benefits for home health coverage, hospital bed coverage, and Advanced Illness Program that patient is eligible for  I will share today's [facility] CCM RN home visit with  patient's PCP and [facility] Pharmacist as Juluis Rainier regarding patient's ongoing self-dosing of diuretics  [facility] CM outreach to continue withscheduledphone call in 9 days  [facility] CM Care Plan Problem Two     Most Recent Value  Care Plan Problem Two  Knoweldge deficit around self-health management of chronic disease state of CHF, as evidenced by patient reporting of same  Role Documenting the Problem Two  Care Management Coordinator  Care Plan for Problem Two  Active  Interventions for Problem Two Long Term Goal   Discussed with patient and caregiver patient's current and recent clinical status,  discussed with patient CHF zones and provided/ reviewed additional EMMI educational material on self-health management,  provided and thoroughly reviewed with patient printed EMMI educational material around "when your lungs fill with fluid" and CHF medications, as well  as "working with your doctor"-- using teachback method, confirmed that patient is able to verbalize overall action plan for yellow and green CHF zones  [facility] Long Term Goal  Over the next 65 days, patient will verbalize signs and symptoms of yellow zone along with corresponding action plan, as evidenced by patient reporting during [place] outreach  [address] Term Goal Start Date  01/02/18  [facility] CM Short Term Goal #1   Over the next 30 days, patient will monitor and record daily weights, as evidenced by review of same and patient reporting during [place] outreach  [facility] CM Short Term Goal #1 Start Date  03/06/18-- goal extended today  Interventions for Short Term Goal #2   Discussed with patient his lack of consistently monitoring/ recording daily weights,  re-iterated with patient and caregiver weight gain guidelines, need to consistently monitor/ record daily weights, and encouraged patient to re-initiate this important practice every single day     Oneta Rack, RN, BSN, [employer] Coordinator [facility] Care Management   [phone]

## 2018-03-08 ENCOUNTER — Encounter: Payer: Self-pay | Admitting: Radiation Oncology

## 2018-03-08 ENCOUNTER — Other Ambulatory Visit: Payer: Self-pay

## 2018-03-08 ENCOUNTER — Ambulatory Visit
Admission: RE | Admit: 2018-03-08 | Discharge: 2018-03-08 | Disposition: A | Discharge status: Home / Self Care | Payer: Medicare Other | Source: Ambulatory Visit | Attending: Radiation Oncology | Admitting: Radiation Oncology

## 2018-03-08 DIAGNOSIS — I252 Old myocardial infarction: Secondary | ICD-10-CM | POA: Diagnosis not present

## 2018-03-08 DIAGNOSIS — R918 Other nonspecific abnormal finding of lung field: Secondary | ICD-10-CM | POA: Diagnosis not present

## 2018-03-08 DIAGNOSIS — G4730 Sleep apnea, unspecified: Secondary | ICD-10-CM | POA: Diagnosis not present

## 2018-03-08 DIAGNOSIS — C3412 Malignant neoplasm of upper lobe, left bronchus or lung: Secondary | ICD-10-CM | POA: Diagnosis not present

## 2018-03-08 DIAGNOSIS — Z9981 Dependence on supplemental oxygen: Secondary | ICD-10-CM | POA: Diagnosis not present

## 2018-03-08 DIAGNOSIS — J811 Chronic pulmonary edema: Secondary | ICD-10-CM | POA: Diagnosis not present

## 2018-03-08 DIAGNOSIS — Z87891 Personal history of nicotine dependence: Secondary | ICD-10-CM | POA: Diagnosis not present

## 2018-03-08 DIAGNOSIS — I255 Ischemic cardiomyopathy: Secondary | ICD-10-CM | POA: Diagnosis not present

## 2018-03-08 DIAGNOSIS — I509 Heart failure, unspecified: Secondary | ICD-10-CM | POA: Diagnosis not present

## 2018-03-08 DIAGNOSIS — I2510 Atherosclerotic heart disease of native coronary artery without angina pectoris: Secondary | ICD-10-CM | POA: Diagnosis not present

## 2018-03-08 DIAGNOSIS — E785 Hyperlipidemia, unspecified: Secondary | ICD-10-CM | POA: Insufficient documentation

## 2018-03-08 DIAGNOSIS — F419 Anxiety disorder, unspecified: Secondary | ICD-10-CM | POA: Diagnosis not present

## 2018-03-08 DIAGNOSIS — J918 Pleural effusion in other conditions classified elsewhere: Secondary | ICD-10-CM | POA: Diagnosis not present

## 2018-03-08 DIAGNOSIS — M1990 Unspecified osteoarthritis, unspecified site: Secondary | ICD-10-CM | POA: Diagnosis not present

## 2018-03-08 DIAGNOSIS — I4891 Unspecified atrial fibrillation: Secondary | ICD-10-CM | POA: Diagnosis not present

## 2018-03-08 DIAGNOSIS — Z79899 Other long term (current) drug therapy: Secondary | ICD-10-CM | POA: Diagnosis not present

## 2018-03-08 DIAGNOSIS — Z8572 Personal history of non-Hodgkin lymphomas: Secondary | ICD-10-CM | POA: Diagnosis not present

## 2018-03-08 DIAGNOSIS — Z7901 Long term (current) use of anticoagulants: Secondary | ICD-10-CM | POA: Diagnosis not present

## 2018-03-08 DIAGNOSIS — I130 Hypertensive heart and chronic kidney disease with heart failure and stage 1 through stage 4 chronic kidney disease, or unspecified chronic kidney disease: Secondary | ICD-10-CM | POA: Insufficient documentation

## 2018-03-08 NOTE — Progress Notes (Signed)
Radiation Oncology         [phone]) [phone] ________________________________  Name: [name] [name]        MRN: [id number]  Date of Service: 03/08/2018 DOB: 1940-12-25  YS:AYTKZSW, Percell Miller, MD  Grace Isaac, MD     REFERRING PHYSICIAN: Grace Isaac, MD   DIAGNOSIS: The encounter diagnosis was Primary malignant neoplasm of left upper lobe of lung ([place]).   HISTORY OF PRESENT ILLNESS: [name] [name] is a 77 y.o. male seen at the request of Dr. Servando Snare for an enlarging, PET positive mass in the LUL.  The patient has a significant coronary and cardiovascular history including congestive heart failure, pulmonary edema, atrial fibrillation, O2 dependency, ischemic cardiomyopathy and previous myocardial infarction with stenting.  The patient was hospitalized in May 2019 for CHF exacerbation with weight gain shortness of breath and lower extremity edema.  Unfortunately his heart failure symptoms have been poorly controlled, and he is limited in his physical ability due to massive leg swelling.  During his hospitalization in late April-May for CHF, a CT scan of the chest was performed on 12/27/2017 revealing an irregular spiculated nodule in the left upper lobe corresponding to the density seen on prior chest x-ray the day before and in comparison to his prior scans of the chest in September 2018 this area measured up to 2.7 cm compared to 2.1 cm on prior imaging.  A moderate right pleural effusion and small left pleural effusion were noted with bibasilar atelectasis, compressive atelectasis in the right lower lobe is also noted.  He had mildly enlarged left axillary nodes that were new since prior study, and scattered small mediastinal lymph nodes that were stable to his prior studies.  He also had diffuse coronary artery disease, mild vascular congestion, and cardiomegaly.  Following his discharge he underwent a PET scan on 01/09/2018 revealing hypermetabolism within a 3 x 2.3 cm mass in the medial  left lung apex.  No hypermetabolic thoracic adenopathy was identified.  He had small effusion on the right lung base and trace on the left.  Atherosclerotic changes were noted as well as a right chest port terminating at the cavoatrial junction.  No additional hypermetabolism was seen throughout the lymphatics or of the liver, spleen or pancreas.  No skeletal disease is identified.  He was seen by Dr. Servando Snare, and discussion was had regarding options for putative treatment of what is presumed to be early stage lung cancer.  He comes today to discuss options of stereotactic body radiotherapy without biopsy.    PREVIOUS RADIATION THERAPY: No   PAST MEDICAL HISTORY:  Past Medical History:  Diagnosis Date  . Anxiety    situational  . Bilateral lower extremity edema   . CAD (coronary artery disease) 10/23/2014   a. s/p STEMI in 09/2014 with DES to LAD  . Cancer (HCC)    Mantle cell lymphoma  . Dyspnea    with exertion  . ED (erectile dysfunction)   . Hyperlipidemia   . Hypertension   . Insomnia   . Ischemic cardiomyopathy 10/24/14   EF 40-45% by echo  . Obesity    morbid  . OSA (obstructive sleep apnea)    severe, on CPAP 12  . Osteoarthritis   . Peripheral edema   . Persistent atrial fibrillation ([place]) 05/16/2017  . Restless leg syndrome   . Rhinitis   . Tobacco abuse        PAST SURGICAL HISTORY: Past Surgical History:  Procedure Laterality Date  .  ANTERIOR LAT LUMBAR FUSION N/A 07/02/2016   Procedure: LUMBAR THREE - FOUR ANTERIOR LATERAL LUMBAR FUSION WITH PERCUTANEOUS SCREWS LEFT;  Surgeon: Earnie Larsson, MD;  Location: [place];  Service: Neurosurgery;  Laterality: N/A;  . BACK SURGERY  10/2009  . CORONARY ANGIOPLASTY    . EYE SURGERY     lazer bil  . HEMORRHOID SURGERY    . INTRA-AORTIC BALLOON PUMP INSERTION  10/23/2014   Procedure: INTRA-AORTIC BALLOON PUMP INSERTION;  Surgeon: Peter M Martinique, MD;  Location: [facility] CATH LAB;  Service: Cardiovascular;;  . LEFT HEART  CATHETERIZATION WITH CORONARY ANGIOGRAM N/A 10/23/2014   Procedure: LEFT HEART CATHETERIZATION WITH CORONARY ANGIOGRAM;  Surgeon: Peter M Martinique, MD;  Location: [facility] CATH LAB;  Service: Cardiovascular;  Laterality: N/A;  . LYMPH NODE BIOPSY Right 01/2014   groin area  . PERCUTANEOUS CORONARY STENT INTERVENTION (PCI-S)  10/23/2014   Procedure: PERCUTANEOUS CORONARY STENT INTERVENTION (PCI-S);  Surgeon: Peter M Martinique, MD;  Location: [facility] CATH LAB;  Service: Cardiovascular;;  . PORTACATH PLACEMENT Right 2015  . ROTATOR CUFF REPAIR  2011  . TOTAL KNEE ARTHROPLASTY     left  . TOTAL KNEE ARTHROPLASTY  08/04/2012   right knee  . TOTAL KNEE ARTHROPLASTY  08/04/2012   Procedure: TOTAL KNEE ARTHROPLASTY;  Surgeon: Yvette Rack., MD;  Location: [place];  Service: Orthopedics;  Laterality: Right;  WITH PATELLA RESURFACING     FAMILY HISTORY:  Family History  Problem Relation Age of Onset  . Unexplained death Mother 87       Considered natural causes, no autopsy  . Early death Father        Killed In World War II  . Coronary artery disease Neg Hx      SOCIAL HISTORY:  reports that he quit smoking about 5 months ago. His smoking use included cigarettes. He has a 5.50 pack-year smoking history. He has never used smokeless tobacco. He reports that he does not drink alcohol or use drugs. The patient is widowed and lives in [place]. He's accompanied by his daughter.   ALLERGIES: Patient has no known allergies.   MEDICATIONS:  Current Outpatient Medications  Medication Sig Dispense Refill  . alfuzosin (UROXATRAL) 10 MG 24 hr tablet Take 10 mg by mouth daily with breakfast.    . amoxicillin (AMOXIL) 500 MG tablet Take 500 mg by mouth 3 (three) times daily.    Marland Kitchen atorvastatin (LIPITOR) 40 MG tablet Take 1 tablet (40 mg total) by mouth daily at 6 PM. 90 tablet 3  . clonazePAM (KLONOPIN) 1 MG tablet Take 2 tablets (2 mg total) by mouth at bedtime. 60 tablet 2  . diclofenac sodium (VOLTAREN) 1 % GEL  Apply 4 g topically 4 (four) times daily as needed (for leg soreness). (Patient taking differently: Apply 4 g topically 4 (four) times daily as needed (for leg soreness-mostly uses at bedtime). ) 1 Tube 2  . finasteride (PROSCAR) 5 MG tablet Take 1 tablet (5 mg total) by mouth daily. 30 tablet 12  . lidocaine-prilocaine (EMLA) cream Apply 1 application topically 2 (two) times daily as needed. Applies to ankle area when skin splits from edema.  4  . mirabegron ER (MYRBETRIQ) 25 MG TB24 tablet Take 25 mg by mouth daily.    . Multiple Vitamin (MULTIVITAMIN WITH MINERALS) TABS tablet Take 1 tablet by mouth daily.    . nitroGLYCERIN (NITROSTAT) 0.4 MG SL tablet Place 1 tablet (0.4 mg total) under the tongue every 5 (five) minutes as  needed for chest pain (Max 3 doses within 15 minutes. Call 911 at 3rd dose). (Patient taking differently: Place 0.4 mg under the tongue every 5 (five) minutes as needed for chest pain (Max 3 doses within 15 minutes. Call 911 at 3rd dose). Uses very infrequently.)  12  . oxyCODONE-acetaminophen (PERCOCET) 10-325 MG tablet Take 1 tablet by mouth every 4 (four) hours as needed for pain.     . potassium chloride SA (K-DUR,KLOR-CON) 20 MEQ tablet Take 2 tablets (40 mEq total) by mouth 2 (two) times daily. (Patient taking differently: Take 20 mEq by mouth 3 (three) times daily. 1 tablet three times daily per phone conversation on 6/12.) 360 tablet 3  . primidone (MYSOLINE) 50 MG tablet Take 50 mg by mouth 3 (three) times daily. PRN    . rivaroxaban (XARELTO) 20 MG TABS tablet Take 1 tablet (20 mg total) by mouth daily with supper. 30 tablet 3  . rOPINIRole (REQUIP) 1 MG tablet TAKE 2 TABLETS BY MOUTH AT BEDTIME (Patient taking differently: TAKE 2 MG BY MOUTH AT BEDTIME) 60 tablet 0  . tiZANidine (ZANAFLEX) 4 MG tablet Take 4 mg by mouth 4 (four) times daily as needed for muscle spasms.   12  . torsemide (DEMADEX) 20 MG tablet Take 3 tablets (60 mg total) by mouth 2 (two) times daily.  450 tablet 3  . traZODone (DESYREL) 50 MG tablet Take 50 mg by mouth at bedtime.  5  . triamcinolone ointment (KENALOG) 0.1 % APPLY TO AFFECTED AREA TWICE A DAY 30 g 0   No current facility-administered medications for this encounter.      REVIEW OF SYSTEMS: On review of systems, the patient reports that he is doing well overall. He reports shortness of breath with mild exertion, but  denies any current chest pain,  cough, fevers, chills, night sweats, unintended weight changes. He has gained about 8 pounds in the last few weeks and has not been taking his diuretics regularly. He denies any bowel or bladder disturbances, and denies abdominal pain, nausea or vomiting. He denies any new musculoskeletal or joint aches or pains. A complete review of systems is obtained and is otherwise negative.     PHYSICAL EXAM:  Wt Readings from Last 3 Encounters:  03/08/18 (!) 343 lb (155.6 kg)  03/06/18 (!) 338 lb (153.3 kg)  02/14/18 (!) 330 lb (149.7 kg)   Temp Readings from Last 3 Encounters:  03/08/18 97.8 F (36.6 C)  01/24/18 97.9 F (36.6 C) (Oral)  12/30/17 97.7 F (36.5 C) (Oral)   BP Readings from Last 3 Encounters:  03/08/18 121/61  03/06/18 118/64  02/14/18 122/78   Pulse Readings from Last 3 Encounters:  03/08/18 93  03/06/18 88  02/14/18 94     In general this is a chronically ill appearing caucasian male in no acute distress. He is wearing his O2 via [place], and is alert and oriented x4 and appropriate throughout the examination. HEENT reveals that the patient is normocephalic, atraumatic. EOMs are intact. PERRLA. Skin is intact without any evidence of gross lesions. Cardiopulmonary assessment is negative for acute distress and he exhibits normal effort. Bilateral lower extremity edema is noted and is 2+ with hemosiderin along the tibial tuberosities.    ECOG = 1  0 - Asymptomatic (Fully active, able to carry on all predisease activities without restriction)  1 - Symptomatic  but completely ambulatory (Restricted in physically strenuous activity but ambulatory and able to carry out work of a light or  sedentary nature. For example, light housework, office work)  2 - Symptomatic, <50% in bed during the day (Ambulatory and capable of all self care but unable to carry out any work activities. Up and about more than 50% of waking hours)  3 - Symptomatic, >50% in bed, but not bedbound (Capable of only limited self-care, confined to bed or chair 50% or more of waking hours)  4 - Bedbound (Completely disabled. Cannot carry on any self-care. Totally confined to bed or chair)  5 - Death   Eustace Pen MM, Creech RH, Tormey DC, et al. [phone]). "Toxicity and response criteria of the [facility]". [place] Oncol. 5 (6): 649-55    LABORATORY DATA:  Lab Results  Component Value Date   WBC 4.1 01/18/2018   HGB 11.4 (L) 01/18/2018   HCT 36.2 (L) 01/18/2018   MCV 98.6 01/18/2018   PLT 129 (L) 01/18/2018   Lab Results  Component Value Date   NA 136 01/24/2018   K 3.9 01/24/2018   CL 94 (L) 01/24/2018   CO2 33 (H) 01/24/2018   Lab Results  Component Value Date   ALT 17 01/18/2018   AST 18 01/18/2018   ALKPHOS 135 (H) 01/18/2018   BILITOT 2.7 (H) 01/18/2018      RADIOGRAPHY: No results found.     IMPRESSION/PLAN: 1. Putative Stage IA3, cT1cN0M0, NSCLC of the LUL. Dr. Lisbeth Renshaw discusses the pathology findings and reviews the nature of early stage lung cancer which we believe the patient has. Dr. Lisbeth Renshaw reviews the recent films and discusses the limitations of proceeding with treatment without tissue confirmation, though feels the risks of treatment are low compared to the risks associated with pursuing tissue diagnosis.  We discussed the risks, benefits, short, and long term effects of radiotherapy, and the patient is interested in proceeding. Dr. Lisbeth Renshaw discusses the delivery and logistics of radiotherapy and anticipates a course of  5 fractions of  radiotherapy. Written consent is obtained and placed in the chart, a copy was provided to the patient. He will be contacted to coordinate simulation in the near future. 2. Cardiopulmonary comorbidities. The patient was counseled on the importance of taking his medications. I suggested he set an alarm on his phone which he can do to remind him to take his medication. His daughter will also help to remind him of his medication management. 3. History of Mantle Cell Lymphoma. The patient is no longer seen in surveillance with medical oncology but follows with his PCP regularly.  In a visit lasting 60 minutes, greater than 50% of the time was spent face to face discussing his case, and coordinating the patient's care.  The above documentation reflects my direct findings during this shared patient visit. Please see the separate note by Dr. Lisbeth Renshaw on this date for the remainder of the patient's plan of care.    Carola Rhine, PAC

## 2018-03-09 ENCOUNTER — Ambulatory Visit: Payer: Self-pay

## 2018-03-09 ENCOUNTER — Other Ambulatory Visit: Payer: Self-pay | Admitting: *Deleted

## 2018-03-09 ENCOUNTER — Other Ambulatory Visit: Payer: Self-pay

## 2018-03-09 NOTE — Patient Outreach (Signed)
[place] [facility]) Care Management         [facility] CM Multidisciplinary Case Discussion Update 03/09/2018  KEMUEL BUCHMANN [birth year] [id number]  [facility] CM Multidisciplinary Case Discussion re:  SHAHZAIN KIESTER a 77 y.o.malereferred to [place] afterrecent hospitalization April 29-Dec 30, 2017 for shortness of breath, significant bilateral lower extremity edema, weight gain of 30-plus pounds, CHF exacerbation. Patientwas discharged home to self-care after refusing home health services. Patient has history including, but not limited to, CAD with previous MI/ stenting; ischemic cardiomyopathy; sCHF; Atrial fibrillation; OSA on CPAP; BPH; and obesity.Unfortunately, patient experienced hospital re-admission19 days post- hospital dischargeonMay 22-28, 2019 for CHF exacerbation with weight gain, SOB, and lower extremity swelling. Patient was again discharged home to self-care without home health services in place.  Barriers:    Updates from 03/09/18 case discussion in red text  -- Ongoing patient self-dosing of diuretic therapy with inconsistent reporting of how he takes medication between [place], [facility] pharmacist and even his own daughter/ primary caregiver: patient unwilling to take any evening diuretic doses around quality of life concerns with frequent urination -- at baseline patient is always in yellow CHF zone  New positives:  -- patient did not experience hospital readmission within 30 days of last discharge -- patient allowing his daughter to actively participate in his care and to manage some aspects of his care; daughter remains very active in his care -- patient now agreeable to home health services for nursing -- during home visit 03/06/18, patient states willing to compromise to increase diuretic dosing from 40 q am to 60 mg po q am  From 02/16/18 case discussion:  -- [facility] RN CM home visit successfully completed 02/14/18; patient did not  remember scheduled visit; daughter present for visit and actively participated -- Scales at home were not set for measurement in LBS (was set on KGS); patient had not been consistently monitoring/ recording weights-- this was corrected -- ongoing significant weight gain with weight increase >15 lbs since last recorded value at time of cardiology office visit 01/30/18 -- patient/ caregiver denial of patient's overall state of chronic condition; relatively new diagnosis of lung mass suspicious for malignancy -- patient reticence to have (any form of) health care team visit him at his home -- patient refusal to adhere to recommended diuretic therapy: self-directing diuretic dosing, with PCP approval of same -- newly developed significant recent rash suspicious for poison ivy/ allergy  From 02/02/18 case discussion:  -- did not have accurate scales post-first hospital discharge on Dec 30, 2017-- [facility] CCM pharmacist facilitated new scale delivery to patient's home -- knowledge deficit re: self-health management of chronic state of CHF -- questionable patient denial of chronic state of health: Patient refusal of need home health services; ongoing report of clinical condition inconsistent with patient presentation  -- newly prescribed home O2 post-most recent hospital discharge -- questionable lack of engagement with [place] team: difficulty maintaining contact with patient; patient no-show for previously scheduled home visit -- plan to address lung mass suggestive of malignancy in progress  [facility] CM Team Discussion: see plan below for update from 03/09/18   From 02/16/18 discussion:   -- Goals of care discussion necessary to determine options of palliative care vs. Hospice; [facility] RN CM to contact patient's PCP to facilitate, as patient trusts and has a very good established relationship with PCP -- [place] RM CM/ Pharmacy outreach to continue -- case discussion update planned in 2 weeks,  post next scheduled PCP office visit 02/28/18  From 02/02/18 discussion:  Value of patient having scales delivered by [employer] provider to be introduced/ discussed with patient; [facility] nurse to reach out to patient and to patient's PCP to encourage patient to engage in [place] program; [place] home visit scheduled.  Plan:  03/09/18: [place] team will continue efforts to maintain engagement with patient and his daughter and to collaborate with care providers to develop ongoing plan of carefor self-health management of CHF; [facility] RN CCM has reached out to PCP to collaborate on introducing concept of palliative care in patient's care moving forward-- will continue efforts to support patient and family/ caregiver while honoring patient's quality of life stipulations around diuretic therapy and to slowly introduce concept of palliative care to patient and daughter.  Oneta Rack, RN, BSN, [employer] [facility] Care Management  [phone]

## 2018-03-09 NOTE — Patient Outreach (Signed)
[place] [facility]) Care Management  03/09/2018  [name] [name] Jun 30, 1941 [id number]  77year old malereferred to [place] Management.[place] services requested for medication managementPMHx includes, but not limited to, CAD, MI, ischemic cardiomyopathy, chronic systolic heart failure, h/o DVT, venous insufficiencyofboth lower extremities, obstructive sleep apnea, benign prostatic hyperplasiaandrestless leg syndrome.  Successful outreach to [name] [name].  HIPAA identifiers verified.  Subjective: [name] [name] reports that he is doing okay.  He states he "needs an electric car to get back and forth to the bathroom" due to his frequent urination from his torsemide.  He reports that he is taking 60 mg in the morning and depending on when he took the am dose, that he may take 1-2 tablets in the evening if he has several hours before bedtime.   He reports that he is not a good candidate for a lung biopsy of his mass, so the doctor opted for radiation treatments.  [name] [name] reports that he did not weigh himself this morning, but that he was down 2 lbs yesterday, but he could not tell me the weight.  Patient stated that [name] was supposed to order home health to assist with wrapping his legs, but he has not heard from anyone.  He also said that he had a prescription for an adjustable bed and did not know where to take it.    Assessment: Encouraged [name] [name] to keeping taking his diuretic.  Reminded him that PCP had ordered 60 mg twice daily, but that I am pleased that he was at least taking 60 mg in the morning.  Counseled him about the importance of taking his daily weighs.  Informed him to visit a durable medical equipment store to check on ordering an adjustable bed.    Plan: Outreach to [name]' office and ask them to update [name] [name] about the home health nurse referral that the PCP ordered.  Follow up with [name] [name] in 2-3 weeks.    Joetta Manners,  PharmD Clinical Pharmacist [place] [phone]

## 2018-03-12 DIAGNOSIS — I469 Cardiac arrest, cause unspecified: Secondary | ICD-10-CM | POA: Diagnosis not present

## 2018-03-12 DIAGNOSIS — R402441 Other coma, without documented Glasgow coma scale score, or with partial score reported, in the field [EMT or ambulance]: Secondary | ICD-10-CM | POA: Diagnosis not present

## 2018-03-13 ENCOUNTER — Telehealth: Payer: Self-pay | Admitting: Cardiovascular Disease

## 2018-03-13 ENCOUNTER — Telehealth: Payer: Self-pay | Admitting: Internal Medicine

## 2018-03-13 NOTE — Telephone Encounter (Signed)
Original D/C received from [facility]. # [phone] Marliss Czar [place]). Gave to [place].

## 2018-03-13 NOTE — Telephone Encounter (Signed)
Call received From Truman Hayward w/ [facility]. She stated EMS spoke with Dr.Taylor last night and he agreed to sign d/c. They will drop off here. Made Lee aware Dr.Taylor back in office on Wednesday. She also asked if SSN could be put on d/c the family has lost his card.

## 2018-03-14 ENCOUNTER — Other Ambulatory Visit: Payer: Self-pay | Admitting: *Deleted

## 2018-03-14 NOTE — Patient Outreach (Addendum)
[facility]) Care Management [place] CM Case Closure 03/14/2018  DETRICK DANI [birth year] [id number]  1:30 pm:  Received incoming call/ voice message from Baltazar Najjar, daughter/ caregiver, on [facility] CM written consent re:  TRAVEN DAVIDS, 77 y.o.malereferred to [place] afterrecent hospitalization April 29-Dec 30, 2017 for shortness of breath, significant bilateral lower extremity edema, weight gain of 30-plus pounds, CHF exacerbation. Patientwas discharged home to self-care after refusing home health services. Patient has history including, but not limited to, CAD with previous MI/ stenting; ischemic cardiomyopathy; sCHF; Atrial fibrillation; OSA on CPAP; BPH; and obesity.Unfortunately, patient experienced hospital re-admission19 days post- hospital dischargeonMay 22-28, 2019 for CHF exacerbation with weight gain, SOB, and lower extremity swelling.   I re-contacted Lelan Pons promptly immediately after her call, and she informed that patient had passed away in his sleep on early Sunday morning 03/26/2018.  Lelan Pons stated that she assisted him getting out of bed late Saturday night to go to bathroom, and that when she awoke on the following morning, she found patient in his bed, passed away.  Emotional support and encouragement provided to Lelan Pons in the loss of her father  1:40 pm:  [place] Pharmacist Joetta Manners to inform her of patient's passing/ [facility] CM case closure  Oneta Rack, RN, BSN, [place] Coordinator [facility] Care Management  [phone]

## 2018-03-15 ENCOUNTER — Telehealth: Payer: Self-pay | Admitting: Internal Medicine

## 2018-03-15 ENCOUNTER — Ambulatory Visit: Payer: Self-pay | Admitting: *Deleted

## 2018-03-15 NOTE — Telephone Encounter (Signed)
Dr.Taylor signed & completed d/c on patient. [facility] [employer] made aware and will pick up.

## 2018-03-15 NOTE — Telephone Encounter (Signed)
Completed.

## 2018-03-16 ENCOUNTER — Telehealth: Payer: Self-pay | Admitting: Internal Medicine

## 2018-03-16 NOTE — Telephone Encounter (Signed)
Original D/C received. Gave to [place]

## 2018-03-20 ENCOUNTER — Telehealth: Payer: Self-pay | Admitting: Internal Medicine

## 2018-03-20 NOTE — Telephone Encounter (Signed)
[name] [name] w/ [name] called to check on d/c signed. I made her aware copy was signed and they were called for pick up on 03/15/18.    The original d/c was with Dr.Taylor waiting to be signed. I will call her when original is ready for pick up @ [phone]. She agreed.

## 2018-03-21 ENCOUNTER — Telehealth: Payer: Self-pay | Admitting: Internal Medicine

## 2018-03-21 NOTE — Telephone Encounter (Signed)
Called left message with [place] answering service.Original Death Certificate for this patient is ready for pick up.

## 2018-03-24 ENCOUNTER — Ambulatory Visit: Payer: Medicare Other

## 2018-03-29 ENCOUNTER — Ambulatory Visit: Payer: Medicare Other | Admitting: Cardiovascular Disease

## 2018-03-30 DIAGNOSIS — 419620001 Death: Secondary | SNOMED CT | POA: Diagnosis not present

## 2018-03-30 DEATH — deceased

## 2018-04-11 ENCOUNTER — Ambulatory Visit: Payer: Medicare Other | Admitting: *Deleted

## 2018-05-23 ENCOUNTER — Other Ambulatory Visit: Payer: Self-pay | Admitting: Cardiology

## 2018-09-13 IMAGING — CT CT ANGIO CHEST
2 of 8 series · 18 of 46 positions shown · IV contrast (OMNI)
Comparison: Chest radiograph 05/13/2017

CLINICAL DATA: Shortness of breath

EXAM:
CT ANGIOGRAPHY CHEST WITH CONTRAST
TECHNIQUE: Multidetector CT imaging of the chest was performed using the
standard protocol during bolus administration of intravenous
contrast. Multiplanar CT image reconstructions and MIPs were
obtained to evaluate the vascular anatomy.
CONTRAST:  80 mL Isovue 370

[Series 6: thins · axial · 0.84mm/px · z∈[+1385,+1625]mm · 15 of 265 slices shown]
[im 13/265  lung]
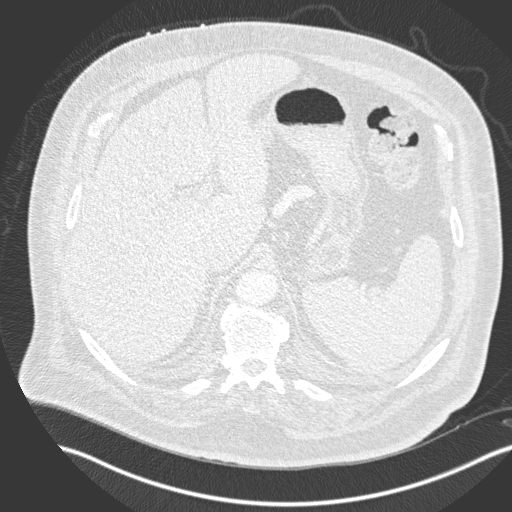
[im 37/265  soft-tissue]
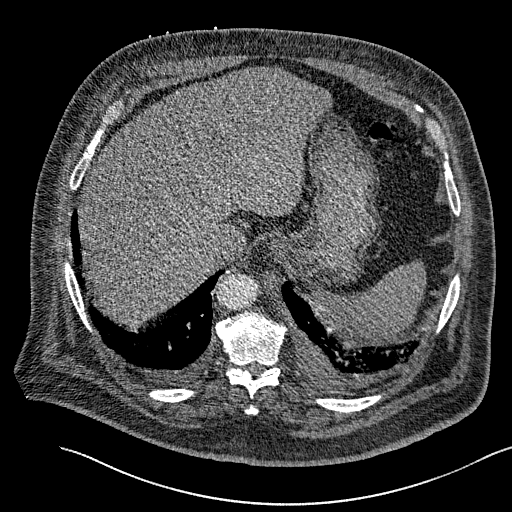
[im 49/265  lung]
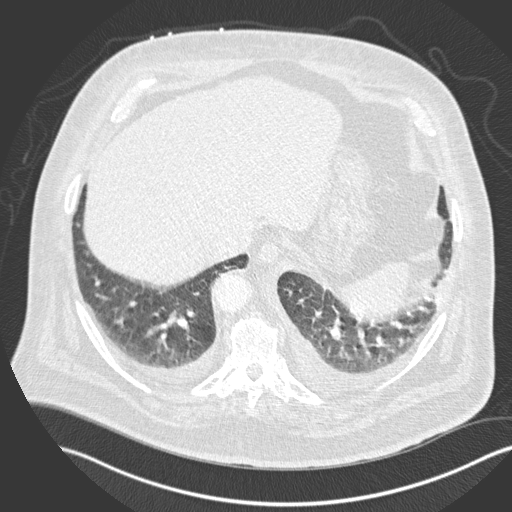
[im 61/265  soft-tissue]
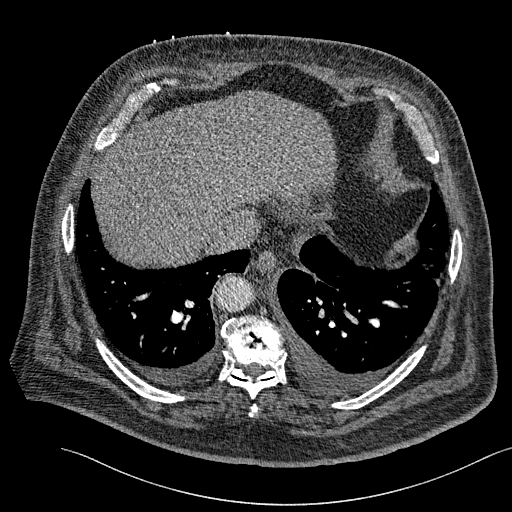
[im 85/265  lung]
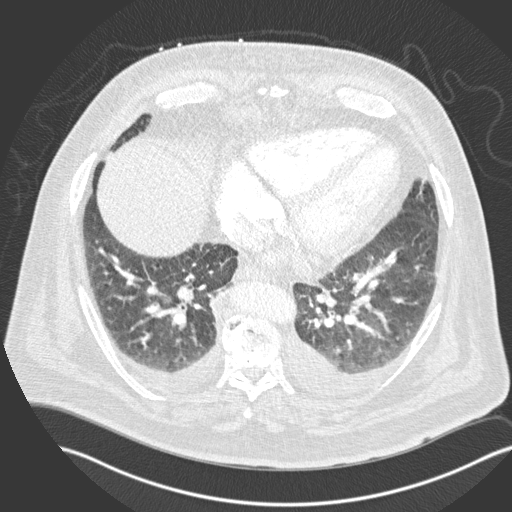
[im 97/265  soft-tissue]
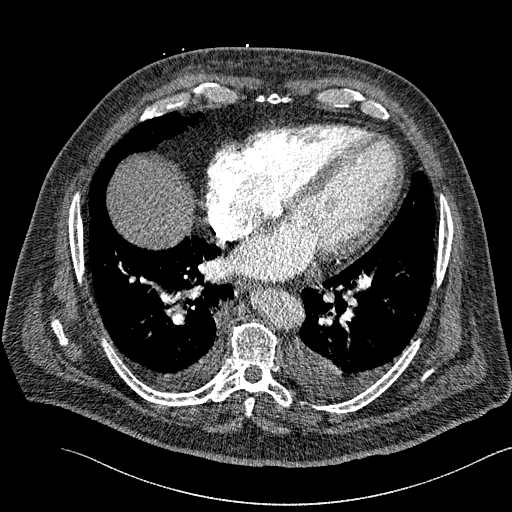
[im 121/265  lung]
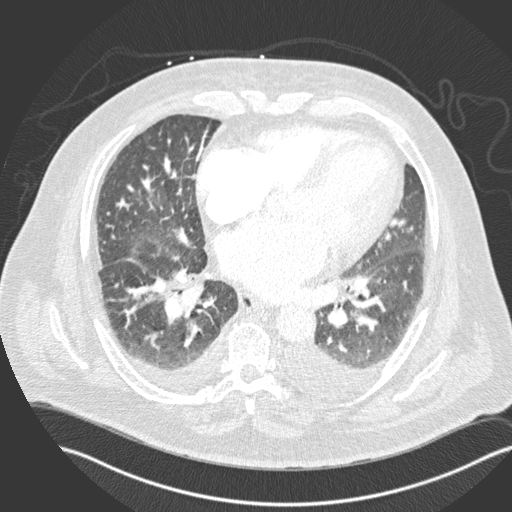
[im 133/265  soft-tissue]
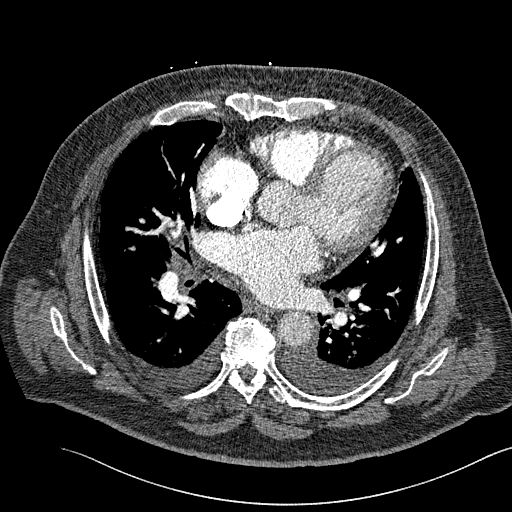
[im 145/265  lung]
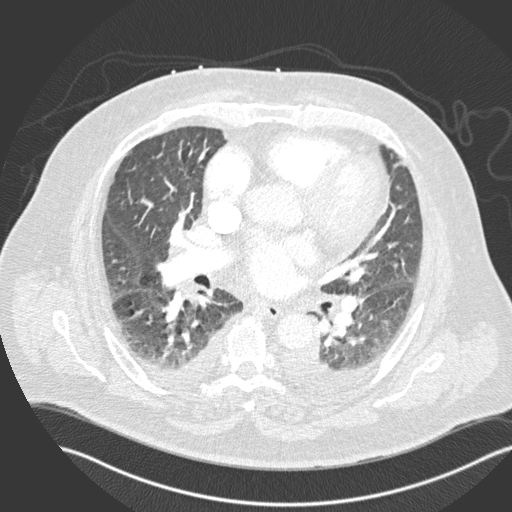
[im 169/265  soft-tissue]
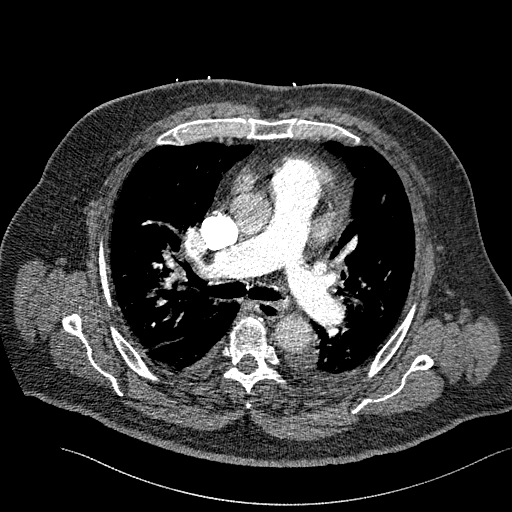
[im 181/265  lung]
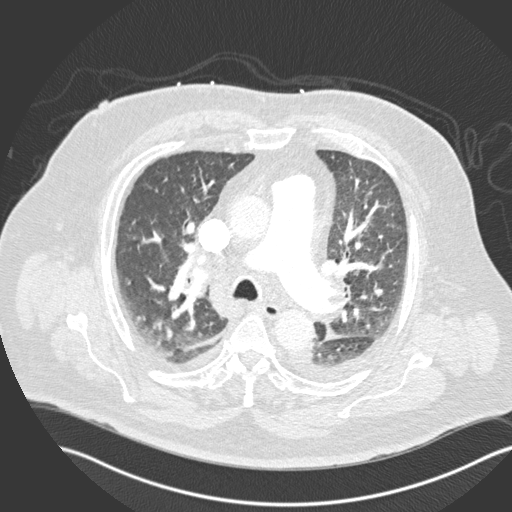
[im 205/265  soft-tissue]
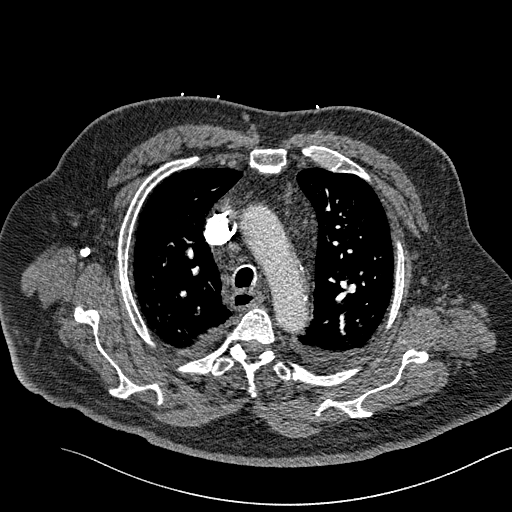
[im 217/265  lung]
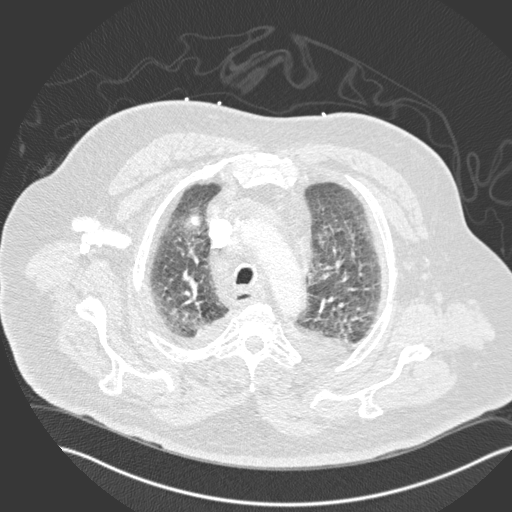
[im 229/265  soft-tissue]
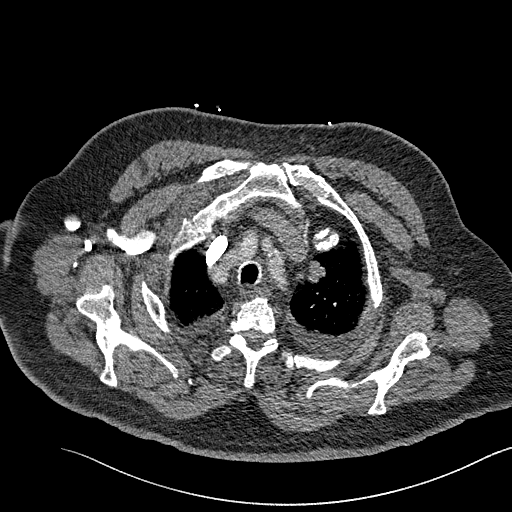
[im 253/265  lung]
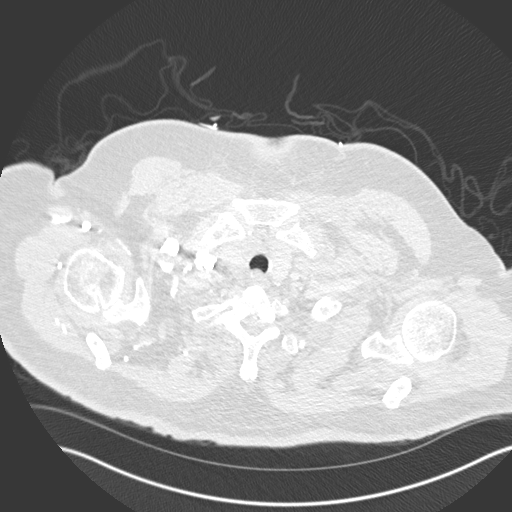

[Series 8: coronal mpr · coronal · 0.52mm/px · 3 of 156 slices shown]
[im 39/156  soft-tissue]
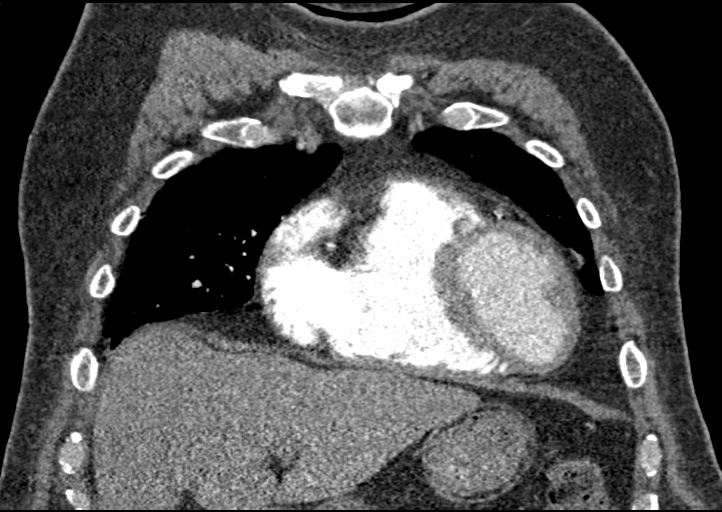
[im 78/156  soft-tissue]
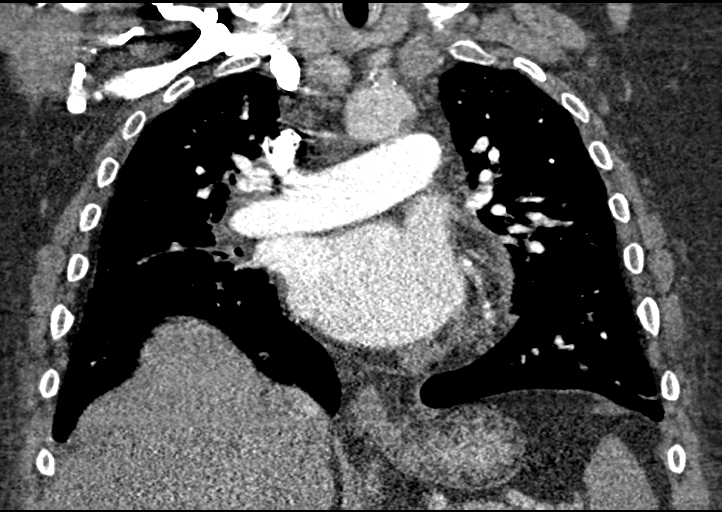
[im 117/156  soft-tissue]
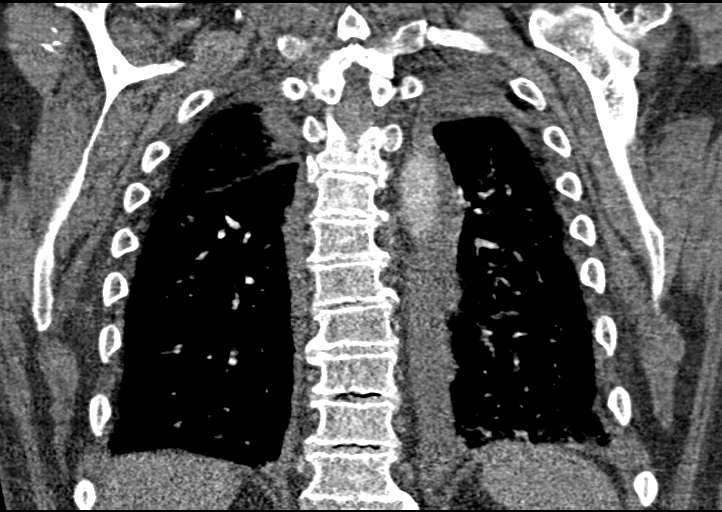

[18 of 46 positions shown; findings below may reference images not displayed]

FINDINGS: Cardiovascular: Contrast injection is sufficient to demonstrate
satisfactory opacification of the pulmonary arteries to the
segmental level. There is no pulmonary embolus. The main pulmonary
artery is enlarged, measuring 3.3 cm at the bifurcation. There is no
CT evidence of acute right heart strain. Mild calcific
atherosclerosis of the aorta. There is a normal 3-vessel arch
branching pattern. Heart size is enlarged. Status post coronary
artery stenting. No pericardial effusion.

Mediastinum/Nodes: No mediastinal, hilar or axillary
lymphadenopathy. The visualized thyroid and thoracic esophageal
course are unremarkable.

Lungs/Pleura: Small bilateral pleural effusions. Mild interstitial
edema. No focal consolidation. There is bibasilar atelectasis. No
pulmonary nodules or masses.

Upper Abdomen: Contrast bolus timing is not optimized for evaluation
of the abdominal organs. Within this limitation, the visualized
organs of the upper abdomen are normal.

Musculoskeletal: No chest wall abnormality. No acute or significant
osseous findings.

Review of the MIP images confirms the above findings.
IMPRESSION: 1. No pulmonary embolus.
2. Cardiomegaly, small bilateral pleural effusions and interstitial
edema, suggesting congestive heart failure.
3. Mildly enlarged main pulmonary artery, which may be seen in
pulmonary hypertension.
4.  Aortic Atherosclerosis ([id number]).

## 2019-02-08 ENCOUNTER — Ambulatory Visit: Payer: Medicare Other
